# Patient Record
Sex: Male | Born: 1955 | Race: Black or African American | Hispanic: No | Marital: Single | Smoking: Current every day smoker
Health system: Southern US, Community
[De-identification: ages and names within clinical notes are randomized; demographics above are authoritative.]

## PROBLEM LIST (undated history)

## (undated) DIAGNOSIS — E119 Type 2 diabetes mellitus without complications: Secondary | ICD-10-CM

## (undated) DIAGNOSIS — I739 Peripheral vascular disease, unspecified: Secondary | ICD-10-CM

## (undated) DIAGNOSIS — I10 Essential (primary) hypertension: Secondary | ICD-10-CM

## (undated) HISTORY — DX: Peripheral vascular disease, unspecified: I73.9

---

## 2008-07-24 ENCOUNTER — Emergency Department: Payer: Self-pay | Admitting: Emergency Medicine

## 2014-08-20 HISTORY — PX: LEG SURGERY: SHX1003

## 2015-02-06 ENCOUNTER — Encounter (HOSPITAL_COMMUNITY): Payer: Self-pay | Admitting: Emergency Medicine

## 2015-02-06 ENCOUNTER — Emergency Department (HOSPITAL_COMMUNITY)
Admission: EM | Admit: 2015-02-06 | Discharge: 2015-02-06 | Disposition: A | Discharge status: Home / Self Care | Payer: Non-veteran care | Attending: Emergency Medicine | Admitting: Emergency Medicine

## 2015-02-06 DIAGNOSIS — I739 Peripheral vascular disease, unspecified: Secondary | ICD-10-CM | POA: Diagnosis not present

## 2015-02-06 DIAGNOSIS — Z720 Tobacco use: Secondary | ICD-10-CM | POA: Diagnosis not present

## 2015-02-06 DIAGNOSIS — Z4801 Encounter for change or removal of surgical wound dressing: Secondary | ICD-10-CM | POA: Diagnosis present

## 2015-02-06 DIAGNOSIS — S81801A Unspecified open wound, right lower leg, initial encounter: Secondary | ICD-10-CM

## 2015-02-06 NOTE — Discharge Instructions (Signed)
Peripheral Vascular Disease Peripheral vascular disease (PVD) is a disease of the blood vessels that are not part of your heart and brain. A simple term for PVD is poor circulation. In most cases, PVD narrows the blood vessels that carry blood from your heart to the rest of your body. This can result in a decreased supply of blood to your arms, legs, and internal organs, like your stomach or kidneys. However, it most often affects a person's lower legs and feet. There are two types of PVD.  Organic PVD. This is the more common type. It is caused by damage to the structure of blood vessels.  Functional PVD. This is caused by conditions that make blood vessels contract and tighten (spasm). Without treatment, PVD tends to get worse over time. PVD can also lead to acute ischemic limb. This is when an arm or limb suddenly has trouble getting enough blood. This is a medical emergency. CAUSES Each type of PVD has many different causes. The most common cause of PVD is buildup of a fatty material (plaque) inside of your arteries (atherosclerosis). Small amounts of plaque can break off from the walls of the blood vessels and become lodged in a smaller artery. This blocks blood flow and can cause acute ischemic limb. Other common causes of PVD include:  Blood clots that form inside of blood vessels.  Injuries to blood vessels.  Diseases that cause inflammation of blood vessels or cause blood vessel spasms.  Health behaviors and health history that increase your risk of developing PVD. RISK FACTORS  You may have a greater risk of PVD if you:  Have a family history of PVD.  Have certain medical conditions, including:  High cholesterol.  Diabetes.  High blood pressure (hypertension).  Coronary heart disease.  Past problems with blood clots.  Past injury, such as burns or a broken bone. These may have damaged blood vessels in your limbs.  Buerger disease. This is caused by inflamed blood  vessels in your hands and feet.  Some forms of arthritis.  Rare birth defects that affect the arteries in your legs.  Use tobacco.  Do not get enough exercise.  Are obese.  Are age 50 or older. SIGNS AND SYMPTOMS  PVD may cause many different symptoms. Your symptoms depend on what part of your body is not getting enough blood. Some common signs and symptoms include:  Cramps in your lower legs. This may be a symptom of poor leg circulation (claudication).  Pain and weakness in your legs while you are physically active that goes away when you rest (intermittent claudication).  Leg pain when at rest.  Leg numbness, tingling, or weakness.  Coldness in a leg or foot, especially when compared with the other leg.  Skin or hair changes. These can include:  Hair loss.  Shiny skin.  Pale or bluish skin.  Thick toenails.  Inability to get or maintain an erection (erectile dysfunction). People with PVD are more prone to developing ulcers and sores on their toes, feet, or legs. These may take longer than normal to heal. DIAGNOSIS Your health care provider may diagnose PVD from your signs and symptoms. The health care provider will also do a physical exam. You may have tests to find out what is causing your PVD and determine its severity. Tests may include:  Blood pressure recordings from your arms and legs and measurements of the strength of your pulses (pulse volume recordings).  Imaging studies using sound waves to take pictures of   the blood flow through your blood vessels (Doppler ultrasound).  Injecting a dye into your blood vessels before having imaging studies using:  X-rays (angiogram or arteriogram).  Computer-generated X-rays (CT angiogram).  A powerful electromagnetic field and a computer (magnetic resonance angiogram or MRA). TREATMENT Treatment for PVD depends on the cause of your condition and the severity of your symptoms. It also depends on your age. Underlying  causes need to be treated and controlled. These include long-lasting (chronic) conditions, such as diabetes, high cholesterol, and high blood pressure. You may need to first try making lifestyle changes and taking medicines. Surgery may be needed if these do not work. Lifestyle changes may include:  Quitting smoking.  Exercising regularly.  Following a low-fat, low-cholesterol diet. Medicines may include:  Blood thinners to prevent blood clots.  Medicines to improve blood flow.  Medicines to improve your blood cholesterol levels. Surgical procedures may include:  A procedure that uses an inflated balloon to open a blocked artery and improve blood flow (angioplasty).  A procedure to put in a tube (stent) to keep a blocked artery open (stent implant).  Surgery to reroute blood flow around a blocked artery (peripheral bypass surgery).  Surgery to remove dead tissue from an infected wound on the affected limb.  Amputation. This is surgical removal of the affected limb. This may be necessary in cases of acute ischemic limb that are not improved through medical or surgical treatments. HOME CARE INSTRUCTIONS  Take medicines only as directed by your health care provider.  Do not use any tobacco products, including cigarettes, chewing tobacco, or electronic cigarettes. If you need help quitting, ask your health care provider.  Lose weight if you are overweight, and maintain a healthy weight as directed by your health care provider.  Eat a diet that is low in fat and cholesterol. If you need help, ask your health care provider.  Exercise regularly. Ask your health care provider to suggest some good activities for you.  Use compression stockings or other mechanical devices as directed by your health care provider.  Take good care of your feet.  Wear comfortable shoes that fit well.  Check your feet often for any cuts or sores. SEEK MEDICAL CARE IF:  You have cramps in your legs  while walking.  You have leg pain when you are at rest.  You have coldness in a leg or foot.  Your skin changes.  You have erectile dysfunction.  You have cuts or sores on your feet that are not healing. SEEK IMMEDIATE MEDICAL CARE IF:  Your arm or leg turns cold and blue.  Your arms or legs become red, warm, swollen, painful, or numb.  You have chest pain or trouble breathing.  You suddenly have weakness in your face, arm, or leg.  You become very confused or lose the ability to speak.  You suddenly have a very bad headache or lose your vision.   This information is not intended to replace advice given to you by your health care provider. Make sure you discuss any questions you have with your health care provider.   Document Released: 05/20/2004 Document Revised: 05/03/2014 Document Reviewed: 09/20/2013 Elsevier Interactive Patient Education 2016 [employer]  

## 2015-02-06 NOTE — ED Notes (Signed)
Surgery April 26th 2016 at the [facility] in [place] right lower extremity to improve blood flow. Since then wound incision site right anterior lower leg. States unable to palpate pulses in right foot. States does have decreases feeling in right foot for 2 months and right foot turning darker in the past 2-3 weeks.  Here for second opinion.

## 2015-02-06 NOTE — ED Provider Notes (Signed)
CSN: [id number]     Arrival date & time 02/06/15  0935 History   First MD Initiated Contact with Patient 02/06/15 [phone]     Chief Complaint  Patient presents with  . Wound Check     (Consider location/radiation/quality/duration/timing/severity/associated sxs/prior Treatment) HPI Comments: 59 year old male here with right leg wound. He has a history of vascular disease and had a femoropopliteal bypass by the [place]. He still does not have good blood flow to the foot and wound healing on his right leg has been very poor. The wound is from his prior surgery. He does not have any fever, vomiting, cellulitis, systemic symptoms. He has not seen wound care for the wound. He is concerned because the [facility] wants [facility]. He wants a second opinion. When asked about claudication symptoms, he states he's able to walk long distances without pain and has no pain at rest.  Patient is a 59 y.o. male presenting with wound check. The history is provided by the patient.  Wound Check This is a new problem. The current episode started more than 1 week ago. The problem occurs constantly. The problem has been gradually worsening. Pertinent negatives include no chest pain and no abdominal pain. Nothing aggravates the symptoms. Nothing relieves the symptoms.    History reviewed. No pertinent past medical history. Past Surgical History  Procedure Laterality Date  . Leg surgery     No family history on file. Social History  Substance Use Topics  . Smoking status: Current Every Day Smoker  . Smokeless tobacco: None  . Alcohol Use: No    Review of Systems  Constitutional: Negative for fever and chills.  Respiratory: Negative for cough.   Cardiovascular: Negative for chest pain.  Gastrointestinal: Negative for vomiting and abdominal pain.  All other systems reviewed and are negative.     Allergies  Review of patient's allergies indicates no known allergies.  Home Medications   Prior to Admission  medications   Not on File   BP 121/76 mmHg  Pulse 105  Temp(Src) 98.6 F (37 C) (Oral)  Resp 18  Ht  (1.778 m)  Wt 172 lb (78.019 kg)  BMI 24.68 kg/m2  SpO2 99% Physical Exam  Constitutional: He is oriented to person, place, and time. He appears well-developed and well-nourished. No distress.  HENT:  Head: Normocephalic and atraumatic.  Mouth/Throat: No oropharyngeal exudate.  Eyes: EOM are normal. Pupils are equal, round, and reactive to light.  Neck: Normal range of motion. Neck supple.  Cardiovascular: Normal rate and regular rhythm.  Exam reveals no friction rub.   No murmur heard. Pulmonary/Chest: Effort normal and breath sounds normal. No respiratory distress. He has no wheezes. He has no rales.  Abdominal: Soft. He exhibits no distension. There is no tenderness. There is no rebound.  Musculoskeletal: Normal range of motion. He exhibits no edema.       Legs: No pulses palpable or dopplerable in the R foot.   Neurological: He is alert and oriented to person, place, and time.  Skin: No rash noted. He is not diaphoretic.  Nursing note and vitals reviewed.   ED Course  Procedures (including critical care time) Labs Review Labs Reviewed - No data to display  Imaging Review No results found. I have personally reviewed and evaluated these images and lab results as part of my medical decision-making.   EKG Interpretation None      MDM   Final diagnoses:  Peripheral arterial disease (HCC)  Leg  wound, right, initial encounter    [age] year old male here with peripheral arterial disease. He has chronic peripheral arterial disease and had a recent femoropopliteal bypass. He wants a second opinion because the [facility] wants [facility] to [facility]. Without symptoms such as claudication or rest pain, do not think he needs in the Edition here. He has no pulses in the right foot that I can find with manual inspection or with Doppler. I believe this is chronic. His wound appears to be  chronic but does not appear infected. I arrange vascular surgery follow-up for him next week with Dr. Imogene Burnhen. I also instructed him to follow-up with his PCP for optimization prior to his asked her surgery referral. I do not feel he needs acute intervention here he has good cap refill in his feet and his foot is not cold nor acutely painful. I do not think he has acute arterial ischemia, I believe this is more chronic. He is stable for discharge.    Elwin MochaBlair Anastaisa Wooding, MD 02/06/15 [phone]

## 2015-02-11 ENCOUNTER — Encounter: Payer: Self-pay | Admitting: Vascular Surgery

## 2015-02-14 ENCOUNTER — Ambulatory Visit (INDEPENDENT_AMBULATORY_CARE_PROVIDER_SITE_OTHER): Payer: Non-veteran care | Admitting: Vascular Surgery

## 2015-02-14 ENCOUNTER — Encounter: Payer: Self-pay | Admitting: Vascular Surgery

## 2015-02-14 VITALS — BP 106/65 | HR 106 | Temp 98.5°F | Resp 18 | Ht 70.5 in | Wt 167.0 lb

## 2015-02-14 DIAGNOSIS — I70732 Atherosclerosis of other type of bypass graft(s) of the right leg with ulceration of calf: Secondary | ICD-10-CM | POA: Diagnosis not present

## 2015-02-14 DIAGNOSIS — I739 Peripheral vascular disease, unspecified: Secondary | ICD-10-CM | POA: Insufficient documentation

## 2015-02-14 DIAGNOSIS — I70209 Unspecified atherosclerosis of native arteries of extremities, unspecified extremity: Secondary | ICD-10-CM | POA: Insufficient documentation

## 2015-02-14 DIAGNOSIS — L98499 Non-pressure chronic ulcer of skin of other sites with unspecified severity: Secondary | ICD-10-CM

## 2015-02-14 NOTE — Progress Notes (Signed)
Referred by:  [facility]  Reason for referral: second opinion R leg ischemia  History of Present Illness  Brad Frost is a 59 y.o. (04/20/1956) male s/p R fem[facility]pop [facility] [place] [facility] who presents with chief complaint: non-healing ulcer in R calf.  This patient is not clear the exact details of his recent bypass operation.  He notes when he returned to the [facility], he was told he needed a R BKA.  The patient has a ulcer in right ulcer that has not healed since his operation.   He denies any rest pain.  He does have some mild intermittent claudication still.  He denies any fever or chills.  The ulcer is somewhat improved from previous.    Past Medical History  Diagnosis Date  . Peripheral vascular disease [facility](HCC)     Past Surgical History  Procedure Laterality Date  . Leg surgery  August 20, 2014    Right Leg  BPG    Social History   Social History  . Marital Status: Single    Spouse Name: N/A  . Number of Children: N/A  . Years of Education: N/A   Occupational History  . Not on file.   Social History Main Topics  . Smoking status: Current Some Day Smoker    Types: Cigarettes  . Smokeless tobacco: Never Used  . Alcohol Use: No  . Drug Use: No  . Sexual Activity: Not on file   Other Topics Concern  . Not on file   Social History Narrative    Family History: the patient is unable to detail his parents' medical history.  Current Outpatient Prescriptions  Medication Sig Dispense Refill  . aspirin 81 MG tablet Take 81 mg by mouth daily.    Marland Kitchen. oxycodone (OXY-IR) 5 MG capsule Take 5 mg by mouth every 4 (four) hours as needed for pain.     No current facility-administered medications for this visit.    No Known Allergies   REVIEW OF SYSTEMS:  (Positives checked otherwise negative)  CARDIOVASCULAR:   [x]  coronary artery disease [ ]  chest pressure,  [x]  palpitations,  [ ]  shortness of breath when laying flat,  [ ]  shortness of breath with exertion,   [ ]  pain in  feet when walking,  [x]  pain in feet when laying flat, [x]  history of blood clot in veins (DVT),  [ ]  history of phlebitis,  [ ]  swelling in legs,  [ ]  varicose veins  PULMONARY:   [ ]  productive cough,  [ ]  asthma,  [ ]  wheezing  NEUROLOGIC:   [ ]  weakness in arms or legs,  [ ]  numbness in arms or legs,  [ ]  difficulty speaking or slurred speech,  [ ]  temporary loss of vision in one eye,  [ ]  dizziness  HEMATOLOGIC:   [ ]  bleeding problems,  [ ]  problems with blood clotting too easily  MUSCULOSKEL:   [ ]  joint pain, [ ]  joint swelling  GASTROINTEST:   [ ]  vomiting blood,  [ ]  blood in stool     GENITOURINARY:   [ ]  burning with urination,  [ ]  blood in urine  PSYCHIATRIC:   [ ]  history of major depression  INTEGUMENTARY:   [ ]  rashes,  [ ]  ulcers  CONSTITUTIONAL:   [ ]  fever,  [ ]  chills   For VQI Use Only  PRE-ADM LIVING: Home  AMB STATUS: Ambulatory  CAD Sx: None  PRIOR CHF: None  STRESS TEST: [  x] No,  Normal,  + ischemia,  + MI,  Both   Physical Examination  Filed Vitals:   02/14/15 1013  BP: 106/65  Pulse: 106  Temp: 98.5 F (36.9 C)  TempSrc: Oral  Resp: 18  Height: 5' 10.5" (1.791 m)  Weight: 167 lb (75.751 kg)  SpO2: 96%   Body mass index is 23.62 kg/(m^2).  General: A&O x 3, WD, thin  Head: [place]/AT  Ear/Nose/Throat: Hearing grossly intact, nares w/o erythema or drainage, oropharynx w/o Erythema/Exudate, Mallampati score: 3  Eyes: PERRLA, EOMI  Neck: Supple, no nuchal rigidity, no palpable LAD  Pulmonary: Sym exp, good air movt, CTAB, no rales, rhonchi, & wheezing  Cardiac: RRR, Nl S1, S2, no Murmurs, rubs or gallops  Vascular: Vessel Right Left  Radial Palpable Palpable  Brachial Palpable Palpable  Carotid Palpable, without bruit Palpable, without bruit  Aorta Not palpable N/A  Femoral Palpable Palpable  Popliteal Not palpable Not palpable  PT Not Palpable Not Palpable  DP NotPalpable Not Palpable    Gastrointestinal: soft, NTND, -G/R, - HSM, - masses, - CVAT B  Musculoskeletal: M/S 5/5 throughout , Extremities without ischemic changes , ischemic appearing ulcer in mid-calf distal to prior incision, vein harvest incision appears healed, R groin incision healed  Neurologic: CN 2-12 intact , Pain and light touch intact in extremities , Motor exam as listed above  Psychiatric: Judgment intact, Mood & affect appropriate for pt's clinical situation  Dermatologic: See M/S exam for extremity exam, no rashes otherwise noted  Lymph : No Cervical, Axillary, or Inguinal lymphadenopathy    Outside Studies/Documentation [facility] chart requested   Medical Decision Making  Brad Frost is a 59 y.o. male who presents with: RLE critical limb ischemia, s/p R fem-pop BPG   Unfortunately, patient came with no records with limited understanding of his disease and procedures completed for such.  My [facility] has request [facility].  I discussed with the patient the natural history of critical limb ischemia: 25% require amputation in one year, 50% are able to maintain their limbs in one year, and 25-30% die in one year due to comorbidities.  Given the limb threatening status of this patient, I recommend an aggressive work up including proceeding with an: Aortogram, Left leg runoff and possible intervention. I discussed with the patient the nature of angiographic procedures, especially the limited patencies of any endovascular intervention. The patient is aware of that the risks of an angiographic procedure include but are not limited to: bleeding, infection, access site complications, embolization, rupture of treated vessel, dissection, possible need for emergent surgical intervention, and possible need for surgical procedures to treat the patient's pathology. The patient is aware of the risks and agrees to proceed.  The procedure is scheduled for: 31 OCT 16.  I discussed  in depth with the patient the nature of atherosclerosis, and emphasized the importance of maximal medical management including strict control of blood pressure, blood glucose, and lipid levels, antiplatelet agents, obtaining regular exercise, and cessation of smoking.  The patient is aware that without maximal medical management the underlying atherosclerotic disease process will progress, limiting the benefit of any interventions. The patient is currently not on a statin:  Not medically indicated. The patient is currently on an anti-platelet: ASA.  Thank you for allowing [place] to participate in this patient's care.   Leonides Sake, MD [facility] Office: [phone] Pager: [phone]  02/14/2015, 4:34 PM

## 2015-02-17 ENCOUNTER — Other Ambulatory Visit: Payer: Self-pay

## 2015-02-17 NOTE — Addendum Note (Signed)
Addended by: Phillips OdorPULLINS, Viera Okonski S on: 02/17/2015 12:17 PM   Modules accepted: Medications

## 2015-02-21 ENCOUNTER — Encounter (HOSPITAL_BASED_OUTPATIENT_CLINIC_OR_DEPARTMENT_OTHER): Payer: Non-veteran care | Attending: Internal Medicine

## 2015-02-24 ENCOUNTER — Ambulatory Visit (HOSPITAL_COMMUNITY): Admission: RE | Admit: 2015-02-24 | Payer: Non-veteran care | Source: Ambulatory Visit | Admitting: Vascular Surgery

## 2015-02-24 ENCOUNTER — Encounter (HOSPITAL_COMMUNITY): Admission: RE | Payer: Self-pay | Source: Ambulatory Visit

## 2015-02-24 SURGERY — ABDOMINAL AORTOGRAM
Anesthesia: LOCAL

## 2015-05-16 ENCOUNTER — Telehealth: Payer: Self-pay

## 2015-05-16 NOTE — Telephone Encounter (Signed)
Attempted to call pt re: rescheduling Aortogram with right lower extremity runoff; possible intervention.  Left voice message to call the office re: rescheduling.  Attempted to contact pt's. Brother; left voice message to have the pt. Call our office re: rescheduling procedure.

## 2019-04-04 ENCOUNTER — Emergency Department: Payer: No Typology Code available for payment source

## 2019-04-04 ENCOUNTER — Inpatient Hospital Stay
Admission: EM | Admit: 2019-04-04 | Discharge: 2019-04-09 | DRG: 177 | Disposition: A | Discharge status: Skilled Nursing Facility | Payer: No Typology Code available for payment source | Source: Skilled Nursing Facility | Attending: Internal Medicine | Admitting: Internal Medicine

## 2019-04-04 ENCOUNTER — Encounter: Payer: Self-pay | Admitting: Emergency Medicine

## 2019-04-04 ENCOUNTER — Other Ambulatory Visit: Payer: Self-pay

## 2019-04-04 DIAGNOSIS — N179 Acute kidney failure, unspecified: Secondary | ICD-10-CM | POA: Diagnosis present

## 2019-04-04 DIAGNOSIS — E1140 Type 2 diabetes mellitus with diabetic neuropathy, unspecified: Secondary | ICD-10-CM | POA: Diagnosis present

## 2019-04-04 DIAGNOSIS — Z7984 Long term (current) use of oral hypoglycemic drugs: Secondary | ICD-10-CM | POA: Diagnosis not present

## 2019-04-04 DIAGNOSIS — R0902 Hypoxemia: Secondary | ICD-10-CM | POA: Diagnosis present

## 2019-04-04 DIAGNOSIS — Z86718 Personal history of other venous thrombosis and embolism: Secondary | ICD-10-CM

## 2019-04-04 DIAGNOSIS — Z7901 Long term (current) use of anticoagulants: Secondary | ICD-10-CM

## 2019-04-04 DIAGNOSIS — I739 Peripheral vascular disease, unspecified: Secondary | ICD-10-CM | POA: Diagnosis not present

## 2019-04-04 DIAGNOSIS — Z7982 Long term (current) use of aspirin: Secondary | ICD-10-CM | POA: Diagnosis not present

## 2019-04-04 DIAGNOSIS — J1289 Other viral pneumonia: Secondary | ICD-10-CM | POA: Diagnosis present

## 2019-04-04 DIAGNOSIS — I2699 Other pulmonary embolism without acute cor pulmonale: Secondary | ICD-10-CM | POA: Insufficient documentation

## 2019-04-04 DIAGNOSIS — I959 Hypotension, unspecified: Secondary | ICD-10-CM | POA: Diagnosis present

## 2019-04-04 DIAGNOSIS — I2782 Chronic pulmonary embolism: Secondary | ICD-10-CM | POA: Diagnosis present

## 2019-04-04 DIAGNOSIS — Z87891 Personal history of nicotine dependence: Secondary | ICD-10-CM

## 2019-04-04 DIAGNOSIS — U071 COVID-19: Principal | ICD-10-CM | POA: Diagnosis present

## 2019-04-04 DIAGNOSIS — Z8249 Family history of ischemic heart disease and other diseases of the circulatory system: Secondary | ICD-10-CM

## 2019-04-04 DIAGNOSIS — J850 Gangrene and necrosis of lung: Secondary | ICD-10-CM | POA: Diagnosis present

## 2019-04-04 DIAGNOSIS — Z66 Do not resuscitate: Secondary | ICD-10-CM | POA: Diagnosis present

## 2019-04-04 DIAGNOSIS — R918 Other nonspecific abnormal finding of lung field: Secondary | ICD-10-CM | POA: Insufficient documentation

## 2019-04-04 DIAGNOSIS — E1151 Type 2 diabetes mellitus with diabetic peripheral angiopathy without gangrene: Secondary | ICD-10-CM | POA: Diagnosis present

## 2019-04-04 DIAGNOSIS — Z7902 Long term (current) use of antithrombotics/antiplatelets: Secondary | ICD-10-CM | POA: Diagnosis not present

## 2019-04-04 DIAGNOSIS — J1282 Pneumonia due to coronavirus disease 2019: Secondary | ICD-10-CM | POA: Insufficient documentation

## 2019-04-04 DIAGNOSIS — F0390 Unspecified dementia without behavioral disturbance: Secondary | ICD-10-CM | POA: Diagnosis present

## 2019-04-04 HISTORY — DX: Type 2 diabetes mellitus without complications: E11.9

## 2019-04-04 LAB — LACTIC ACID, PLASMA: Lactic Acid, Venous: 1.7 mmol/L (ref 0.5–1.9)

## 2019-04-04 LAB — CBC WITH DIFFERENTIAL/PLATELET
Abs Immature Granulocytes: 0.02 10*3/uL (ref 0.00–0.07)
Basophils Absolute: 0 10*3/uL (ref 0.0–0.1)
Basophils Relative: 0 %
Eosinophils Absolute: 0 10*3/uL (ref 0.0–0.5)
Eosinophils Relative: 0 %
HCT: 43.3 % (ref 39.0–52.0)
Hemoglobin: 13.8 g/dL (ref 13.0–17.0)
Immature Granulocytes: 0 %
Lymphocytes Relative: 11 %
Lymphs Abs: 1 10*3/uL (ref 0.7–4.0)
MCH: 29.5 pg (ref 26.0–34.0)
MCHC: 31.9 g/dL (ref 30.0–36.0)
MCV: 92.5 fL (ref 80.0–100.0)
Monocytes Absolute: 0.8 10*3/uL (ref 0.1–1.0)
Monocytes Relative: 9 %
Neutro Abs: 6.8 10*3/uL (ref 1.7–7.7)
Neutrophils Relative %: 80 %
Platelets: 213 10*3/uL (ref 150–400)
RBC: 4.68 MIL/uL (ref 4.22–5.81)
RDW: 17 % — ABNORMAL HIGH (ref 11.5–15.5)
WBC: 8.6 10*3/uL (ref 4.0–10.5)
nRBC: 0 % (ref 0.0–0.2)

## 2019-04-04 LAB — C-REACTIVE PROTEIN: CRP: 3.6 mg/dL — ABNORMAL HIGH (ref <1.0)

## 2019-04-04 LAB — GLUCOSE, CAPILLARY: Glucose-Capillary: 81 mg/dL (ref 70–99)

## 2019-04-04 LAB — FERRITIN: Ferritin: 239 ng/mL (ref 24–336)

## 2019-04-04 LAB — HEMOGLOBIN A1C
Hgb A1c MFr Bld: 6.1 % — ABNORMAL HIGH (ref 4.8–5.6)
Mean Plasma Glucose: 128.37 mg/dL

## 2019-04-04 LAB — PROTIME-INR
INR: 1.3 — ABNORMAL HIGH (ref 0.8–1.2)
Prothrombin Time: 16 seconds — ABNORMAL HIGH (ref 11.4–15.2)

## 2019-04-04 LAB — COMPREHENSIVE METABOLIC PANEL
ALT: 31 U/L (ref 0–44)
AST: 38 U/L (ref 15–41)
Albumin: 2.6 g/dL — ABNORMAL LOW (ref 3.5–5.0)
Alkaline Phosphatase: 112 U/L (ref 38–126)
Anion gap: 14 (ref 5–15)
BUN: 51 mg/dL — ABNORMAL HIGH (ref 8–23)
CO2: 19 mmol/L — ABNORMAL LOW (ref 22–32)
Calcium: 8.3 mg/dL — ABNORMAL LOW (ref 8.9–10.3)
Chloride: 106 mmol/L (ref 98–111)
Creatinine, Ser: 1.82 mg/dL — ABNORMAL HIGH (ref 0.61–1.24)
GFR calc Af Amer: 44 mL/min — ABNORMAL LOW (ref >60)
GFR calc non Af Amer: 38 mL/min — ABNORMAL LOW (ref >60)
Glucose, Bld: 90 mg/dL (ref 70–99)
Potassium: 4.3 mmol/L (ref 3.5–5.1)
Sodium: 139 mmol/L (ref 135–145)
Total Bilirubin: 0.6 mg/dL (ref 0.3–1.2)
Total Protein: 7.2 g/dL (ref 6.5–8.1)

## 2019-04-04 LAB — HIV ANTIBODY (ROUTINE TESTING W REFLEX): HIV Screen 4th Generation wRfx: NONREACTIVE

## 2019-04-04 LAB — FIBRIN DERIVATIVES D-DIMER (ARMC ONLY): Fibrin derivatives D-dimer (ARMC): 2157.27 ng/mL (FEU) — ABNORMAL HIGH (ref 0.00–499.00)

## 2019-04-04 LAB — FIBRINOGEN: Fibrinogen: 533 mg/dL — ABNORMAL HIGH (ref 210–475)

## 2019-04-04 LAB — HEPARIN LEVEL (UNFRACTIONATED): Heparin Unfractionated: 1.66 IU/mL — ABNORMAL HIGH (ref 0.30–0.70)

## 2019-04-04 LAB — PROCALCITONIN: Procalcitonin: <0.1 ng/mL

## 2019-04-04 LAB — ABO/RH: ABO/RH(D): O POS

## 2019-04-04 LAB — APTT: aPTT: 32 seconds (ref 24–36)

## 2019-04-04 LAB — TROPONIN I (HIGH SENSITIVITY)
Troponin I (High Sensitivity): 11 ng/L (ref <18)
Troponin I (High Sensitivity): 15 ng/L (ref <18)

## 2019-04-04 MED ORDER — POLYETHYLENE GLYCOL 3350 17 G PO PACK
17.0000 g | PACK | Freq: Every day | ORAL | Status: DC
  Administered 2019-04-06 – 2019-04-09 (×3): 17 g via ORAL
  Filled 2019-04-04 (×3): qty 1

## 2019-04-04 MED ORDER — CILOSTAZOL 100 MG PO TABS
100.0000 mg | ORAL_TABLET | Freq: Two times a day (BID) | ORAL | Status: DC
  Administered 2019-04-05 – 2019-04-09 (×6): 100 mg via ORAL
  Filled 2019-04-04 (×12): qty 1

## 2019-04-04 MED ORDER — SENNOSIDES-DOCUSATE SODIUM 8.6-50 MG PO TABS
1.0000 | ORAL_TABLET | Freq: Two times a day (BID) | ORAL | Status: DC
  Administered 2019-04-04 – 2019-04-09 (×7): 1 via ORAL
  Filled 2019-04-04 (×10): qty 1

## 2019-04-04 MED ORDER — SODIUM CHLORIDE 0.9 % IV SOLN
100.0000 mg | Freq: Every day | INTRAVENOUS | Status: AC
  Administered 2019-04-05 – 2019-04-08 (×4): 100 mg via INTRAVENOUS
  Filled 2019-04-04 (×3): qty 100
  Filled 2019-04-04: qty 20

## 2019-04-04 MED ORDER — SODIUM CHLORIDE 0.9 % IV SOLN
INTRAVENOUS | Status: DC
  Administered 2019-04-04 – 2019-04-07 (×4): via INTRAVENOUS

## 2019-04-04 MED ORDER — VITAMIN D 25 MCG (1000 UNIT) PO TABS
1000.0000 [IU] | ORAL_TABLET | Freq: Every day | ORAL | Status: DC
  Administered 2019-04-04 – 2019-04-09 (×4): 1000 [IU] via ORAL
  Filled 2019-04-04 (×5): qty 1

## 2019-04-04 MED ORDER — IOHEXOL 300 MG/ML  SOLN
60.0000 mL | Freq: Once | INTRAMUSCULAR | Status: AC | PRN
  Administered 2019-04-04: 60 mL via INTRAVENOUS

## 2019-04-04 MED ORDER — ONDANSETRON HCL 4 MG/2ML IJ SOLN: 4.0000 mg | Freq: Four times a day (QID) | INTRAMUSCULAR | Status: DC | PRN

## 2019-04-04 MED ORDER — GABAPENTIN 300 MG PO CAPS
600.0000 mg | ORAL_CAPSULE | Freq: Two times a day (BID) | ORAL | Status: DC
  Administered 2019-04-04 – 2019-04-09 (×7): 600 mg via ORAL
  Filled 2019-04-04 (×7): qty 2
  Filled 2019-04-04: qty 6
  Filled 2019-04-04 (×2): qty 2

## 2019-04-04 MED ORDER — DEXAMETHASONE SODIUM PHOSPHATE 10 MG/ML IJ SOLN
10.0000 mg | Freq: Three times a day (TID) | INTRAMUSCULAR | Status: DC
  Administered 2019-04-04 – 2019-04-05 (×2): 10 mg via INTRAVENOUS
  Filled 2019-04-04 (×3): qty 1

## 2019-04-04 MED ORDER — FOLIC ACID 1 MG PO TABS
1.0000 mg | ORAL_TABLET | Freq: Every day | ORAL | Status: DC
  Administered 2019-04-04 – 2019-04-09 (×4): 1 mg via ORAL
  Filled 2019-04-04 (×6): qty 1

## 2019-04-04 MED ORDER — TAMSULOSIN HCL 0.4 MG PO CAPS
0.4000 mg | ORAL_CAPSULE | Freq: Every day | ORAL | Status: DC
  Administered 2019-04-04 – 2019-04-09 (×4): 0.4 mg via ORAL
  Filled 2019-04-04 (×5): qty 1

## 2019-04-04 MED ORDER — SODIUM CHLORIDE 0.9 % IV SOLN
2.0000 g | Freq: Two times a day (BID) | INTRAVENOUS | Status: DC
  Administered 2019-04-05: 2 g via INTRAVENOUS
  Filled 2019-04-04: qty 2

## 2019-04-04 MED ORDER — ZINC SULFATE 220 (50 ZN) MG PO CAPS
220.0000 mg | ORAL_CAPSULE | Freq: Two times a day (BID) | ORAL | Status: DC
  Administered 2019-04-05 – 2019-04-09 (×6): 220 mg via ORAL
  Filled 2019-04-04 (×11): qty 1

## 2019-04-04 MED ORDER — INSULIN ASPART 100 UNIT/ML ~~LOC~~ SOLN
0.0000 [IU] | Freq: Three times a day (TID) | SUBCUTANEOUS | Status: DC
  Administered 2019-04-07: 1 [IU] via SUBCUTANEOUS
  Filled 2019-04-04 (×3): qty 1

## 2019-04-04 MED ORDER — CEFEPIME HCL 1 G IJ SOLR
1.0000 g | Freq: Once | INTRAMUSCULAR | Status: AC
  Administered 2019-04-04: 1 g via INTRAVENOUS
  Filled 2019-04-04: qty 1

## 2019-04-04 MED ORDER — VITAMIN C 500 MG PO TABS
1000.0000 mg | ORAL_TABLET | Freq: Two times a day (BID) | ORAL | Status: DC
  Administered 2019-04-05 – 2019-04-09 (×6): 1000 mg via ORAL
  Filled 2019-04-04 (×9): qty 2

## 2019-04-04 MED ORDER — SODIUM CHLORIDE 0.9 % IV SOLN
200.0000 mg | Freq: Once | INTRAVENOUS | Status: AC
  Administered 2019-04-04: 200 mg via INTRAVENOUS
  Filled 2019-04-04: qty 200

## 2019-04-04 MED ORDER — AZITHROMYCIN 250 MG PO TABS
250.0000 mg | ORAL_TABLET | Freq: Every day | ORAL | Status: DC
  Administered 2019-04-07 – 2019-04-09 (×2): 250 mg via ORAL
  Filled 2019-04-04 (×5): qty 1

## 2019-04-04 MED ORDER — ACETAMINOPHEN 325 MG PO TABS: 650.0000 mg | ORAL_TABLET | Freq: Four times a day (QID) | ORAL | Status: DC | PRN

## 2019-04-04 MED ORDER — VITAMIN B-1 100 MG PO TABS
200.0000 mg | ORAL_TABLET | Freq: Every day | ORAL | Status: DC
  Administered 2019-04-07 – 2019-04-09 (×2): 200 mg via ORAL
  Filled 2019-04-04 (×5): qty 2

## 2019-04-04 MED ORDER — SODIUM CHLORIDE 0.9 % IV BOLUS
1000.0000 mL | Freq: Once | INTRAVENOUS | Status: AC
Start: 2019-04-04 — End: 2019-04-04
  Administered 2019-04-04: 1000 mL via INTRAVENOUS

## 2019-04-04 MED ORDER — OXYCODONE HCL 5 MG PO TABS: 5.0000 mg | ORAL_TABLET | ORAL | Status: DC | PRN

## 2019-04-04 MED ORDER — SODIUM CHLORIDE 0.9 % IV SOLN
500.0000 mg | Freq: Once | INTRAVENOUS | Status: AC
  Administered 2019-04-04: 500 mg via INTRAVENOUS
  Filled 2019-04-04: qty 500

## 2019-04-04 MED ORDER — HEPARIN BOLUS VIA INFUSION
4500.0000 [IU] | Freq: Once | INTRAVENOUS | Status: AC
  Administered 2019-04-04: 4500 [IU] via INTRAVENOUS
  Filled 2019-04-04: qty 4500

## 2019-04-04 MED ORDER — INSULIN ASPART 100 UNIT/ML ~~LOC~~ SOLN: 0.0000 [IU] | Freq: Every day | SUBCUTANEOUS | Status: DC

## 2019-04-04 MED ORDER — HEPARIN (PORCINE) 25000 UT/250ML-% IV SOLN
1200.0000 [IU]/h | INTRAVENOUS | Status: DC
  Administered 2019-04-04: 1250 [IU]/h via INTRAVENOUS
  Filled 2019-04-04 (×2): qty 250

## 2019-04-04 MED ORDER — ONDANSETRON HCL 4 MG PO TABS: 4.0000 mg | ORAL_TABLET | Freq: Four times a day (QID) | ORAL | Status: DC | PRN

## 2019-04-04 NOTE — Progress Notes (Signed)
Pharmacy Antibiotic Note  Brad Frost is a 63 y.o. male admitted on 04/04/2019 with pneumonia.  Pharmacy has been consulted for cefepime dosing. Patient is COVID positive and on remdesivir.  Plan: Cefepime 2 g IV q12h  Height: 5\' 10"  (177.8 cm) Weight: 175 lb (79.4 kg) IBW/kg (Calculated) : 73  Temp (24hrs), Avg:99 F (37.2 C), Min:99 F (37.2 C), Max:99 F (37.2 C)  Recent Labs  Lab 04/04/19 1404 04/04/19 1531  WBC  --  8.6  CREATININE 1.82*  --   LATICACIDVEN  --  1.7    Estimated Creatinine Clearance: 41.8 mL/min (A) (by C-G formula based on SCr of 1.82 mg/dL (H)).    No Known Allergies  Antimicrobials this admission: Cefepime 12/9 >> Azithromycin 12/9 >>  Dose adjustments this admission: NA  Microbiology results:   Thank you for allowing pharmacy to be a part of this patient's care.  Tawnya Crook, PharmD 04/04/2019 7:59 PM

## 2019-04-04 NOTE — H&P (Addendum)
[facility] [place] at [facility]   PATIENT NAME: Brad Frost    MR#:  [id number]  DATE OF BIRTH:  10/05/1955  DATE OF ADMISSION:  04/04/2019  PRIMARY CARE PHYSICIAN: [name], [place], MD   REQUESTING/REFERRING PHYSICIAN: Dr Dorothea GlassmanPaul MaLinda  CHIEF COMPLAINT:   Chief Complaint  Patient presents with  . Covid +  . Hypotension    HISTORY OF PRESENT ILLNESS:  Brad MeigsFrederick Mcginness  is a 63 y.o. male was over at [facility] getting rehab.  He could not tell me why he was there.  He is Covid positive as per [facility].  And hypotensive and they sent him over.  In the ER he had a CT scan of the chest that shows bilateral PE and a necrotic lung mass.  Patient does not complain of any shortness of breath or chest pain.  No coughing.  No coughing up blood.  No leg pain.  No fever chills or sweats. Hospitalist services contacted for further evaluation.  Spoke with brother on the phone and states that [facility] period of time and could not walk very well and they sent him over to rehab.  PAST MEDICAL HISTORY:   Past Medical History:  Diagnosis Date  . Diabetes mellitus without complication (HCC)   . Peripheral vascular disease (HCC)     PAST SURGICAL HISTORY:   Past Surgical History:  Procedure Laterality Date  . LEG SURGERY  August 20, 2014   Right Leg  BPG    SOCIAL HISTORY:   Social History   Tobacco Use  . Smoking status: Former Smoker    Types: Cigarettes  . Smokeless tobacco: Never Used  Substance Use Topics  . Alcohol use: No    FAMILY HISTORY:   Family History  Problem Relation Age of Onset  . Hypertension Mother     DRUG ALLERGIES:  No Known Allergies  REVIEW OF SYSTEMS:  CONSTITUTIONAL: No fever, fatigue or weakness.  EYES: No blurred or double vision.  EARS, NOSE, AND THROAT: No tinnitus or ear pain. No sore throat RESPIRATORY: No cough, shortness of breath, wheezing or hemoptysis.  CARDIOVASCULAR: No chest pain,  orthopnea, edema.  GASTROINTESTINAL: No nausea, vomiting, diarrhea or abdominal pain. No blood in bowel movements GENITOURINARY: No dysuria, hematuria.  ENDOCRINE: No polyuria, nocturia,  HEMATOLOGY: No anemia, easy bruising or bleeding SKIN: No rash or lesion. MUSCULOSKELETAL: No joint pain or arthritis.   NEUROLOGIC: No tingling, numbness, weakness.  PSYCHIATRY: No anxiety or depression.   MEDICATIONS AT HOME:   Prior to Admission medications   Medication Sig Start Date End Date Taking? Authorizing Provider  acetaminophen (TYLENOL) 500 MG tablet Take 500 mg by mouth every 6 (six) hours as needed.   Yes [provider]  atorvastatin (LIPITOR) 80 MG tablet Take 80 mg by mouth daily.   Yes [provider]  folic acid (FOLVITE) 1 MG tablet Take 1 mg by mouth daily.   Yes [provider]  gabapentin (NEURONTIN) 300 MG capsule Take 600 mg by mouth 2 (two) times daily.    Yes [provider]  metFORMIN (GLUCOPHAGE) 500 MG tablet Take 500 mg by mouth daily.   Yes [provider]  polyethylene glycol (MIRALAX / GLYCOLAX) 17 g packet Take 17 g by mouth 2 (two) times daily.   Yes [provider]  Rivaroxaban (XARELTO) 15 MG TABS tablet Take 15 mg by mouth 2 (two) times daily with a meal.   Yes  [provider]  senna-docusate (SENOKOT-S) 8.6-50 MG tablet Take 1 tablet by mouth 2 (two) times daily.   Yes [provider]  tamsulosin (FLOMAX) 0.4 MG CAPS capsule Take 0.4 mg by mouth daily.   Yes [provider]  thiamine (VITAMIN B-1) 100 MG tablet Take 200 mg by mouth daily.   Yes [provider]  zinc sulfate 220 (50 Zn) MG capsule Take 220 mg by mouth 2 (two) times daily.   Yes [provider]  aspirin 81 MG tablet Take 81 mg by mouth daily.    [provider]  cilostazol (PLETAL) 100 MG tablet Take 100 mg by mouth 2 (two) times daily.    [provider]  oxycodone (OXY-IR) 5 MG  capsule Take 5 mg by mouth every 4 (four) hours as needed for pain.    [provider]   Medication reconciliation still undergoing.  VITAL SIGNS:  Blood pressure 113/76, pulse (!) 110, temperature 99 F (37.2 C), temperature source Oral, resp. rate 18, height 5\' 10"  (1.778 m), weight 79.4 kg, SpO2 100 %.  PHYSICAL EXAMINATION:  GENERAL:  63 y.o.-year-old patient lying in the bed with no acute distress.  EYES: Pupils equal, round, reactive to light and accommodation. No scleral icterus. Extraocular muscles intact.  HEENT: Head atraumatic, normocephalic. Oropharynx and nasopharynx clear.  NECK:  Supple, no jugular venous distention. No thyroid enlargement, no tenderness.  LUNGS: Decreased breath sounds bilaterally, no wheezing, rales,rhonchi or crepitation. No use of accessory muscles of respiration.  CARDIOVASCULAR: S1, S2 normal. No murmurs, rubs, or gallops.  ABDOMEN: Soft, nontender, nondistended. Bowel sounds present. No organomegaly or mass.  EXTREMITIES: No pedal edema, cyanosis, or clubbing.  NEUROLOGIC: Cranial nerves II through XII are intact. Muscle strength 5/5 in all extremities. Sensation intact. Gait not checked.  PSYCHIATRIC: The patient is alert and answers yes or no questions appropriately but not the best historian.76  SKIN: Chronic lower extremity discoloration and scaling bilaterally.Marland Kitchen   LABORATORY PANEL:   CBC Recent Labs  Lab 04/04/19 1531  WBC 8.6  HGB 13.8  HCT 43.3  PLT 213   ------------------------------------------------------------------------------------------------------------------  Chemistries  Recent Labs  Lab 04/04/19 1404  NA 139  K 4.3  CL 106  CO2 19*  GLUCOSE 90  BUN 51*  CREATININE 1.82*  CALCIUM 8.3*  AST 38  ALT 31  ALKPHOS 112  BILITOT 0.6   ------------------------------------------------------------------------------------------------------------------    RADIOLOGY:  Ct Chest W Contrast  Result Date:  04/04/2019 CLINICAL DATA:  63 year old COVID-19 positive patient who had an abnormal chest x-ray earlier today, possibly indicating LEFT-sided pneumonia. EXAM: CT CHEST WITH CONTRAST TECHNIQUE: Multidetector CT imaging of the chest was performed during intravenous contrast administration. CONTRAST:  70mL OMNIPAQUE IOHEXOL 300 MG/ML IV. COMPARISON:  No prior CT. Chest x-ray earlier same day and previously. FINDINGS: Respiratory motion blurred many of the images. Cardiovascular: Filling defects within the distal main pulmonary arteries bilaterally extending into the branches of the RIGHT UPPER LOBE, RIGHT MIDDLE LOBE, RIGHT LOWER LOBE and LEFT UPPER LOBE. Since the examination was not performed with angiographic technique, opacification of the arteries is not optimal. There is no evidence of RIGHT heart strain. Normal heart size. Moderate LAD and RIGHT coronary atherosclerosis. No pericardial effusion. Mild atherosclerosis involving the aortic arch without evidence of aneurysm. Mediastinum/Nodes: No pathologically enlarged mediastinal, hilar or axillary lymph nodes. No mediastinal masses. Normal-appearing esophagus. 5 mm nodule involving the UPPER pole the RIGHT lobe of the thyroid gland; remainder of the thyroid  gland normal in appearance. Lungs/Pleura: Necrotic mass with irregular margins involving the anteroinferior LEFT UPPER LOBE, abutting the pleura, measuring approximately 3.2 x 2.6 x 2.9 cm (series 3/image 54 and series 5/image 51). No parenchymal nodules or masses elsewhere in either lung. Emphysematous changes diffusely throughout both lungs. Azygos fissure. Linear scar or atelectasis involving the LEFT LOWER LOBE and lingula. No pleural effusions. Confluent peripheral opacities deep in the POSTERIOR costophrenic sulcus of the LEFT LOWER LOBE. Central airways patent without significant bronchial wall thickening. Upper Abdomen: Phrygian cap involving the gallbladder which mimics a liver lesion. Visualized  upper abdomen unremarkable. Musculoskeletal: Mild degenerative changes involving the thoracic spine. No acute findings. IMPRESSION: 1. Bilateral pulmonary emboli involving the distal main pulmonary arteries bilaterally extending into the branches of the RIGHT UPPER LOBE, RIGHT MIDDLE LOBE, RIGHT LOWER LOBE and LEFT UPPER LOBE. 2. 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 3. Confluent airspace opacities deep in the POSTERIOR sulcus of the LEFT LOWER LOBE in the posterior costophrenic sulcus of the LEFT LOWER LOBE, atelectasis favored over pneumonia. 4. 5 mm nodule involving the upper pole the RIGHT lobe of the thyroid gland, statistically a benign adenoma. No followup recommended (ref: J Am Coll Radiol. 2015 Feb;12(2): 143-50). Aortic Atherosclerosis (ICD10-I70.0) and Emphysema (ICD10-J43.9). I telephoned these results at the time of interpretation on 04/04/2019 at 4:51 pm to provider Conni Slipper, MD of the emergency department, who verbally acknowledged these results. Electronically Signed   By: Evangeline Dakin M.D.   On: 04/04/2019 16:51   Dg Chest Portable 1 View  Result Date: 04/04/2019 CLINICAL DATA:  Hypotension, COVID-19 positive EXAM: PORTABLE CHEST 1 VIEW COMPARISON:  None. FINDINGS: The heart size and mediastinal contours are within normal limits. Focal airspace opacities within the mid to lower aspects of the left long. No pleural effusion or pneumothorax. The visualized skeletal structures are unremarkable. IMPRESSION: Left mid to lower lung zone airspace opacities suspicious for pneumonia. Electronically Signed   By: Davina Poke M.D.   On: 04/04/2019 12:48    EKG:   Sinus tachycardia 111 bpm nonspecific ST-T wave changes.  IMPRESSION AND PLAN:   1.  COVID-19 positive pneumonia.  Start Decadron, remdesivir, antibiotics.  Send off procalcitonin, D-dimer, ferritin, fibrinogen and CRP.   Empiric vitamin C, vitamin D and zinc. 2.  Bilateral pulmonary embolism.  Looks like he was on twice a day Xarelto as outpatient.  Will switch over to heparin drip at this point. 3.  Necrotic lung mass.  Suspicious for cancerous process.  Pulmonary consultation.  Likely will have to wait until Covid negative until procedure. 4.  Peripheral vascular disease on anticoagulation and Pletal.  Hold aspirin. 5.  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  We will put on sliding scale insulin.  Check a hemoglobin A1c. 6.  Acute kidney injury versus chronic kidney disease.  With contrast with CT scan need to watch creatinine closely.  Gentle IV fluid hydration. 7.  DO NOT RESUSCITATE present on admission.  Patient asked about his CODE STATUS and patient wishes to be a DO NOT RESUSCITATE at this time.  Case discussed with brother and he said for now can keep what the patient answered.    All the records are reviewed and case discussed with ED provider. Management plans discussed with the patient, family and they are in agreement.  CODE STATUS: DNR  TOTAL TIME TAKING CARE OF THIS PATIENT: 50 minutes.  Alford Highland M.D on 04/04/2019 at 5:55 PM  Between 7am to 6pm - Pager - [phone]  After 6pm call admission pager [phone]  [facility]  CC: Primary care physician; [name], [name], MD

## 2019-04-04 NOTE — ED Notes (Signed)
Pt linen and brief changed at this time. New gown placed on pt, Pt comfortable In bed with no needs at this time

## 2019-04-04 NOTE — ED Notes (Signed)
Lab called for a phlebotomy lab draw. Patient is a difficult stick.

## 2019-04-04 NOTE — Progress Notes (Signed)
ANTICOAGULATION CONSULT NOTE  Pharmacy Consult for heparin Indication: pulmonary embolus  No Known Allergies  Patient Measurements: Height: 5\' 10"  (177.8 cm) Weight: 175 lb (79.4 kg) IBW/kg (Calculated) : 73 Heparin Dosing Weight: 79 kg  Vital Signs: Temp: 99 F (37.2 C) (12/09 1224) Temp Source: Oral (12/09 1224) BP: 124/78 (12/09 1930) Pulse Rate: 120 (12/09 1930)  Labs: Recent Labs    04/04/19 1404 04/04/19 1425 04/04/19 1531 04/04/19 1740 04/04/19 1750  HGB  --   --  13.8  --   --   HCT  --   --  43.3  --   --   PLT  --   --  213  --   --   APTT  --   --   --  32  --   LABPROT  --   --   --  16.0*  --   INR  --   --   --  1.3*  --   HEPARINUNFRC  --   --   --   --  1.66*  CREATININE 1.82*  --   --   --   --   TROPONINIHS 11 15  --   --   --     Estimated Creatinine Clearance: 41.8 mL/min (A) (by C-G formula based on SCr of 1.82 mg/dL (H)).   Medical History: Past Medical History:  Diagnosis Date  . Diabetes mellitus without complication ([place])   . Peripheral vascular disease [facility])      Assessment: 63 year old male from [facility] tested COVID positive and hypotensive on arrival. CT chest with bilateral pulmonary emboli. Per MAR from [facility], patient is on Xarelto PTA. His dose is 15 mg BID with last dose 12/9 at 0900. Question if patient recently started treatment for PE or DVT as this represents initial treatment dosing of Xarelto. Patient to transition to heparin drip at this time.  Goal of Therapy:  Heparin level 0.3-0.7 units/ml aPTT 66-102 seconds Monitor platelets by anticoagulation protocol: Yes   Plan:  Heparin 4500 unit bolus followed by heparin drip at 1250 units/hr. Will follow APTT until correlation with HL. Will defer HL with am labs as it will likely remain elevated. APTT ordered for 12/10 at 0200. CBC daily while on heparin drip.  Tawnya Crook, PharmD 04/04/2019,8:02 PM

## 2019-04-04 NOTE — ED Triage Notes (Signed)
Pt to ED via EMS from [facility] with c/o testing covid +, and upon arrival, hypotensive. Pt denies any complaints or pain. bg en route 101, last night per nurse, pt had temp of 101 and c/o sore throat on Monday. Appears in no distress at this time.

## 2019-04-04 NOTE — ED Notes (Signed)
Pt is resting and is not expressing any needs at this time.  Pt was repositioned and a meal tray given.

## 2019-04-04 NOTE — ED Provider Notes (Signed)
[facility] Emergency Department Provider Note   ____________________________________________   First MD Initiated Contact with Patient 04/04/19 1214     (approximate)  I have reviewed the triage vital signs and the nursing notes.   HISTORY  Chief Complaint Covid + and Hypotension    HPI Brad Frost is a 63 y.o. male sent from [facility] because he tested Covid +45 minutes ago with him the doctor there thought he should go to [facility] to get better treatment.  Of course [facility] is only excepting patients who require 4 L of oxygen or more.  Patient does not have any oxygen requirements his O2 sats are 99 to 100% on room air.  Additionally he denies any complaints he had a sore throat yesterday but none today.  He is not having any shortness of breath no coughing although he has occasionally had a slight cough.  He is not coughing now.  He has no other aches or pains or chest tightness or anything else.  On checking his blood pressure was found to be low at 82 systolic.  This was not present during his ride here from EMS.  He does have a known history of peripheral vascular disease last blood pressure in the computers 120 and that was in 2016 when he also had peripheral vascular disease.  Again the EMS blood pressures were quite a bit higher.  When we checked his blood pressure with their blood pressure machine and it also comes out low now.         Past Medical History:  Diagnosis Date  . Diabetes mellitus without complication (HCC)   . Peripheral vascular disease [facility])     Patient Active Problem List   Diagnosis Date Noted  . 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 LEG SURGERY  August 20, 2014   Right Leg  BPG    Prior to Admission medications   Medication Sig Start Date End Date Taking? Authorizing Provider  aspirin 81 MG tablet Take 81 mg by mouth  daily.    [provider]  cilostazol (PLETAL) 100 MG tablet Take 100 mg by mouth 2 (two) times daily.    [provider]  gabapentin (NEURONTIN) 300 MG capsule Take 300 mg by mouth. TAKE 1 CAP BID X 7 DAYS, THEN TAKE 1 CAP TID X 7 DAYS, THEN TAKE 2 CAPS AT 8:00 AM, AND 2 CAPS AT BEDTIME X 14 DAYS.    [provider]  oxycodone (OXY-IR) 5 MG capsule Take 5 mg by mouth every 4 (four) hours as needed for pain.    [provider]    Allergies Patient has no known allergies.  No family history on file.  Social History Social History   Tobacco Use  . Smoking status: Current Some Day Smoker    Types: Cigarettes  . Smokeless tobacco: Never Used  Substance Use Topics  . Alcohol use: No  . Drug use: No    Review of Systems  Constitutional: No fever/chills Eyes: No visual changes. ENT: No sore throat. Cardiovascular: Denies chest pain. Respiratory: Denies shortness of breath. Gastrointestinal: No abdominal pain.  No nausea, no vomiting.  No diarrhea.  No constipation. Genitourinary: Negative for dysuria. Musculoskeletal: Negative for back pain. Skin: Negative for rash. Neurological: Negative for headaches, focal weakness   ____________________________________________   PHYSICAL EXAM:  VITAL SIGNS: ED Triage Vitals  Enc Vitals Group  BP 04/04/19 1224 (!) 83/56     Pulse --      Resp 04/04/19 1224 18     Temp 04/04/19 1216 99 F (37.2 C)     Temp Source 04/04/19 1216 Oral     SpO2 04/04/19 1224 99 %     Weight --      Height --      Head Circumference --      [facility] Flow --      Pain Score 04/04/19 1225 0     Pain Loc --      Pain Edu? --      Excl. in [place]? --    Constitutional: Alert and oriented. Well appearing and in no acute distress. Eyes: Conjunctivae are normal. PERRL. EOMI. Head: Atraumatic. Nose: No congestion/rhinnorhea. Mouth/Throat: Mucous membranes are moist.  Oropharynx non-erythematous. Neck: No stridor.   Cardiovascular: Normal rate, regular rhythm. Grossly normal heart sounds.  Good peripheral pulses. Respiratory: Normal respiratory effort.  No retractions. Lungs scattered crackles worse in the bases Gastrointestinal: Soft and nontender. No distention. No abdominal bruits. No CVA tenderness. Musculoskeletal: No lower extremity tenderness nor edema.  . Neurologic:  Normal speech and language. No gross focal neurologic deficits are appreciated.  Skin:  Skin is warm, dry and intact. No rash noted.   ____________________________________________   LABS (all labs ordered are listed, but only abnormal results are displayed)  Labs Reviewed  COMPREHENSIVE METABOLIC PANEL - Abnormal; Notable for the following components:      Result Value   CO2 19 (*)    BUN 51 (*)    Creatinine, Ser 1.82 (*)    Calcium 8.3 (*)    Albumin 2.6 (*)    GFR calc non Af Amer 38 (*)    GFR calc Af Amer 44 (*)    All other components within normal limits  CBC WITH DIFFERENTIAL/PLATELET - Abnormal; Notable for the following components:   RDW 17.0 (*)    All other components within normal limits  LACTIC ACID, PLASMA  CBC WITH DIFFERENTIAL/PLATELET  URINALYSIS, COMPLETE (UACMP) WITH MICROSCOPIC  PROCALCITONIN  TROPONIN I (HIGH SENSITIVITY)  TROPONIN I (HIGH SENSITIVITY)   ____________________________________________  EKG  EKG read and interpreted by me shows sinus tachycardia rate of 111 normal axis flipped T's in V3 may be due to lead placement otherwise no acute changes. ____________________________________________  RADIOLOGY  ED MD interpretation: Patchy infiltrate worse on the left this is my reading we will wait for the radiologist read     CT read by radiology have not been able to review the film yet she has bilateral pulmonary emboli and a necrotic lung mass. Official radiology report(s): Dg Chest Portable 1 View  Result Date: 04/04/2019 CLINICAL DATA:  Hypotension, COVID-19 positive EXAM:  PORTABLE CHEST 1 VIEW COMPARISON:  None. FINDINGS: The heart size and mediastinal contours are within normal limits. Focal airspace opacities within the mid to lower aspects of the left long. No pleural effusion or pneumothorax. The visualized skeletal structures are unremarkable. IMPRESSION: Left mid to lower lung zone airspace opacities suspicious for pneumonia. Electronically Signed   By: Davina Poke M.D.   On: 04/04/2019 12:48    ____________________________________________   PROCEDURES  Procedure(s) performed (including Critical Care): Critical care time 45 minutes.  This includes checking on the patient several times reviewing his studies and talking to the radiologist and the hospitalist  Procedures   ____________________________________________   [place] / ASSESSMENT AND PLAN / ED COURSE Brad Frost  was evaluated in Emergency Department on 04/04/2019 for the symptoms described in the history of present illness. He was evaluated in the context of the global COVID-19 pandemic, which necessitated consideration that the patient might be at risk for infection with the SARS-CoV-2 virus that causes COVID-19. Institutional protocols and algorithms that pertain to the evaluation of patients at risk for COVID-19 are in a state of rapid change based on information released by regulatory bodies including the CDC and federal and state organizations. These policies and algorithms were followed during the patient's care in the ED.  Radiology calls back with the CT report of necrotic lung mass and bilateral pulmonary emboli.  I will start him on heparin and contact the hospitalist.             ____________________________________________   FINAL CLINICAL IMPRESSION(S) / ED DIAGNOSES  Final diagnoses:  Lung mass  Bilateral pulmonary embolism (HCC)  Hypotension, unspecified hypotension type  Lab test positive for detection of COVID-19 virus     ED Discharge Orders     None       Note:  This document was prepared using Dragon voice recognition software and may include unintentional dictation errors.    Arnaldo NatalMalinda, Niel Peretti F, MD 04/04/19 [id number]

## 2019-04-04 NOTE — ED Notes (Signed)
IV team at bedside 

## 2019-04-04 NOTE — ED Notes (Signed)
Report given to Gracie RN.

## 2019-04-05 ENCOUNTER — Other Ambulatory Visit: Payer: Self-pay

## 2019-04-05 DIAGNOSIS — I959 Hypotension, unspecified: Secondary | ICD-10-CM | POA: Insufficient documentation

## 2019-04-05 LAB — CBC
HCT: 35.1 % — ABNORMAL LOW (ref 39.0–52.0)
Hemoglobin: 11.4 g/dL — ABNORMAL LOW (ref 13.0–17.0)
MCH: 29.6 pg (ref 26.0–34.0)
MCHC: 32.5 g/dL (ref 30.0–36.0)
MCV: 91.2 fL (ref 80.0–100.0)
Platelets: 227 10*3/uL (ref 150–400)
RBC: 3.85 MIL/uL — ABNORMAL LOW (ref 4.22–5.81)
RDW: 16.8 % — ABNORMAL HIGH (ref 11.5–15.5)
WBC: 8.4 10*3/uL (ref 4.0–10.5)
nRBC: 0 % (ref 0.0–0.2)

## 2019-04-05 LAB — GLUCOSE, CAPILLARY
Glucose-Capillary: 103 mg/dL — ABNORMAL HIGH (ref 70–99)
Glucose-Capillary: 125 mg/dL — ABNORMAL HIGH (ref 70–99)
Glucose-Capillary: 130 mg/dL — ABNORMAL HIGH (ref 70–99)
Glucose-Capillary: 93 mg/dL (ref 70–99)

## 2019-04-05 LAB — BASIC METABOLIC PANEL
Anion gap: 11 (ref 5–15)
BUN: 37 mg/dL — ABNORMAL HIGH (ref 8–23)
CO2: 20 mmol/L — ABNORMAL LOW (ref 22–32)
Calcium: 8.3 mg/dL — ABNORMAL LOW (ref 8.9–10.3)
Chloride: 109 mmol/L (ref 98–111)
Creatinine, Ser: 1.09 mg/dL (ref 0.61–1.24)
GFR calc Af Amer: >60 mL/min (ref >60)
GFR calc non Af Amer: >60 mL/min (ref >60)
Glucose, Bld: 129 mg/dL — ABNORMAL HIGH (ref 70–99)
Potassium: 4.3 mmol/L (ref 3.5–5.1)
Sodium: 140 mmol/L (ref 135–145)

## 2019-04-05 LAB — MRSA PCR SCREENING: MRSA by PCR: NEGATIVE

## 2019-04-05 LAB — APTT: aPTT: 104 seconds — ABNORMAL HIGH (ref 24–36)

## 2019-04-05 MED ORDER — DEXAMETHASONE SODIUM PHOSPHATE 10 MG/ML IJ SOLN
6.0000 mg | Freq: Two times a day (BID) | INTRAMUSCULAR | Status: DC
  Administered 2019-04-05 – 2019-04-08 (×7): 6 mg via INTRAVENOUS
  Filled 2019-04-05 (×8): qty 0.6

## 2019-04-05 MED ORDER — RIVAROXABAN 20 MG PO TABS: 20.0000 mg | ORAL_TABLET | Freq: Every day | ORAL | Status: DC

## 2019-04-05 MED ORDER — RIVAROXABAN 15 MG PO TABS
15.0000 mg | ORAL_TABLET | Freq: Two times a day (BID) | ORAL | Status: DC
  Administered 2019-04-05 – 2019-04-09 (×7): 15 mg via ORAL
  Filled 2019-04-05 (×10): qty 1

## 2019-04-05 NOTE — ED Notes (Signed)
Checked on patient, brief dry and patient comfortable. Patient trying to sleep. Checked patient's blood sugar and it is stable. Patient instructed to call if help is needed

## 2019-04-05 NOTE — ED Notes (Addendum)
Pt given meal tray and diet ginger ale- states he wants to rest and does not want the rest of his medications right now

## 2019-04-05 NOTE — ED Notes (Signed)
Pt had not eaten any of previous tray

## 2019-04-05 NOTE — Progress Notes (Signed)
[place] for Xarelto Indication: pulmonary embolus  No Known Allergies  Patient Measurements: Height: 5\' 10"  (177.8 cm) Weight: 175 lb (79.4 kg) IBW/kg (Calculated) : 73 Heparin Dosing Weight: 79 kg  Vital Signs: BP: 106/70 (12/10 1300) Pulse Rate: 109 (12/10 1300)  Labs: Recent Labs    04/04/19 1404 04/04/19 1425 04/04/19 1531 04/04/19 1740 04/04/19 1750 04/05/19 0421  HGB  --   --  13.8  --   --  11.4*  HCT  --   --  43.3  --   --  35.1*  PLT  --   --  213  --   --  227  APTT  --   --   --  32  --  104*  LABPROT  --   --   --  16.0*  --   --   INR  --   --   --  1.3*  --   --   HEPARINUNFRC  --   --   --   --  1.66*  --   CREATININE 1.82*  --   --   --   --  1.09  TROPONINIHS 11 15  --   --   --   --     Estimated Creatinine Clearance: 69.8 mL/min (by C-G formula based on SCr of 1.09 mg/dL).   Medical History: Past Medical History:  Diagnosis Date  . Diabetes mellitus without complication ([place])   . Peripheral vascular disease [facility])      Assessment: 63 year old male from [facility] tested COVID positive and hypotensive on arrival. CT chest with bilateral pulmonary emboli. Per [facility] from [facility], patient is on Xarelto PTA. His dose is 15 mg BID with last dose 12/9 at 0900. Question if patient recently started treatment for PE or DVT as this represents initial treatment dosing of Xarelto. Patient to transition to heparin drip at this time. Pharmacy consulted for Xarelto dosing.  Goal of Therapy:  Heparin level 0.3-0.7 units/ml aPTT 66-102 seconds Monitor platelets by anticoagulation protocol: Yes   Plan:  Xarelto 15 mg BID x 21 days followed by 20 mg daily. CBC q72h while inpatient.  Tawnya Crook, PharmD 04/05/2019,3:49 PM

## 2019-04-05 NOTE — Plan of Care (Signed)
  Problem: Education: Goal: Knowledge of General Education information will improve Description: Including pain rating scale, medication(s)/side effects and non-pharmacologic comfort measures Outcome: Not Progressing Note: Patient is confused. Patient curses at staff when unable to answer questions. Patient profile was not competed due to confusion and patient being uncooperative. Spoke with patient's sister Brad Frost. She was given an update about his current cognitive condition.

## 2019-04-05 NOTE — ED Notes (Signed)
Pt given ginger ale.

## 2019-04-05 NOTE — ED Notes (Signed)
Pt expressed no needs to this RN. Will continue to monitor.

## 2019-04-05 NOTE — Progress Notes (Signed)
CT CHEST REVIEWED  1.LUL lung mass-maybe amendable to Percutaneous biopsy  2.B/L PE  3.LLL opacity atelectasis  Patient with active COVID infection  Plan -Recommend Anticoagulation for PE -Recommend Outpatient follow up for LUL lung mass- can follow up with [place] as outpatient or [facility] for defin[facility]ve dx. -consider Repeat CT chest in 4-6 weeks to assess interval changes -Patient is NOT amendable to Bronchoscopy at this time due to active COVID 19 infection and very high risk for dissemination of the virus.

## 2019-04-05 NOTE — Progress Notes (Signed)
PROGRESS NOTE    Brad Frost  YDX:[id number] DOB: 12-27-1955 DOA: 04/04/2019 PCP: [name], [name], MD   Brief Narrative:  Brad Frost  is a 63 y.o. male was over at [facility] getting rehab.  He could not tell me why he was there.  He is Covid positive as per [facility].  And hypotensive and they sent him over.  In the ER he had a CT scan of the chest that shows bilateral PE and a necrotic lung mass.  Patient does not complain of any shortness of breath or chest pain.  No coughing. Patient remained stable on room air.  He was started on Heparin infusion.  Subjective: Patient was feeling better when seen this morning he was little agitated by all the labs and blood glucose monitoring.  He quit smoking 1-1/7-month ago.  He is not aware of any cancer.  Assessment & Plan:   Active Problems:   PVD (peripheral vascular disease) (HCC)   COVID-19 virus infection  Bilateral PE with necrotic lung mass.  Patient was recently admitted twice at [facility] hospital, no records available.  Per chart review on care everywhere there is a follow-up note during his admission to facility which pointed out history of PE and DVT for which he was on Xarelto 15 mg twice daily.  Cannot found any other documentation.  She also mentioned about lung malignancy and a possible outpatient work-up.  Not sure for how long patient was on Xarelto.  He was placed on heparin infusion in ED.  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 -Restart Xarelto. -We can discontinue heparin infusion. -He will need further work-up for a possible lung malignancy.   COVID-19 positive test (U07.1, COVID-19).  Patient is saturating well on room air.  Denies any shortness of breath. -Started on remdesivir and Decadron-we will complete 5-day course. -Continue vitamin C, zinc and vitamin D3 supplement.  Type 2 diabetes.  Well-controlled diabetes with A1c of 6.1. CBG within normal range. -Continue with SSI.  AKI/CKD.  Unknown baseline but creatinine  improved to 1.09 this morning after hydration. -Continue to monitor.  Objective: Vitals:   04/05/19 0700 04/05/19 0900 04/05/19 1200 04/05/19 1300  BP: 105/70 104/74 103/71 106/70  Pulse: (!) 102 (!) 103 (!) 110 (!) 109  Resp: 20 16 19 16   Temp:      TempSrc:      SpO2: 100% 100% 99% 100%  Weight:      Height:        Intake/Output Summary (Last 24 hours) at 04/05/2019 1507 Last data filed at 04/04/2019 2133 Gross per 24 hour  Intake 1500 ml  Output --  Net 1500 ml   Filed Weights   04/04/19 1225  Weight: 79.4 kg    Examination:  General exam: Appears calm and comfortable  Respiratory system: Clear to auscultation. Respiratory effort normal. Cardiovascular system: S1 & S2 heard, RRR. No JVD, murmurs, rubs, gallops or clicks. No pedal edema. Gastrointestinal system: Abdomen is nondistended, soft and nontender. No organomegaly or masses felt. Normal bowel sounds heard. Central nervous system: Alert and oriented. No focal neurological deficits. Extremities: Symmetric 5 x 5 power. Skin: Hyperpigmentation on both lower extremities. Psychiatry:  Mood & affect appropriate.    DVT prophylaxis: Xarelto Code Status: DNR Family Communication: No family at bedside Disposition Plan: Most likely back to facility.  Consultants:     Procedures:  Antimicrobials:   Data Reviewed: I have personally reviewed following labs and imaging studies  CBC: Recent Labs  Lab  04/04/19 1531 04/05/19 0421  WBC 8.6 8.4  NEUTROABS 6.8  --   HGB 13.8 11.4*  HCT 43.3 35.1*  MCV 92.5 91.2  PLT 213 227   Basic Metabolic Panel: Recent Labs  Lab 04/04/19 1404 04/05/19 0421  NA 139 140  K 4.3 4.3  CL 106 109  CO2 19* 20*  GLUCOSE 90 129*  BUN 51* 37*  CREATININE 1.82* 1.09  CALCIUM 8.3* 8.3*   GFR: Estimated Creatinine Clearance: 69.8 mL/min (by C-G formula based on SCr of 1.09 mg/dL). Liver Function Tests: Recent Labs  Lab 04/04/19 1404  AST 38  ALT 31  ALKPHOS 112   BILITOT 0.6  PROT 7.2  ALBUMIN 2.6*   No results for input(s): LIPASE, AMYLASE in the last 168 hours. No results for input(s): AMMONIA in the last 168 hours. Coagulation Profile: Recent Labs  Lab 04/04/19 1740  INR 1.3*   Cardiac Enzymes: No results for input(s): CKTOTAL, CKMB, CKMBINDEX, TROPONINI in the last 168 hours. BNP (last 3 results) No results for input(s): PROBNP in the last 8760 hours. HbA1C: Recent Labs    04/04/19 1531  HGBA1C 6.1*   CBG: Recent Labs  Lab 04/04/19 2200 04/05/19 0025 04/05/19 0942  GLUCAP 81 93 125*   Lipid Profile: No results for input(s): CHOL, HDL, LDLCALC, TRIG, CHOLHDL, LDLDIRECT in the last 72 hours. Thyroid Function Tests: No results for input(s): TSH, T4TOTAL, FREET4, T3FREE, THYROIDAB in the last 72 hours. Anemia Panel: Recent Labs    04/04/19 1740  FERRITIN 239   Sepsis Labs: Recent Labs  Lab 04/04/19 1531 04/04/19 1608  PROCALCITON  --  <0.10  LATICACIDVEN 1.7  --     No results found for this or any previous visit (from the past 240 hour(s)).   Radiology Studies: CT Chest W Contrast  Result Date: 04/04/2019 CLINICAL DATA:  63 year old COVID-19 positive patient who had an abnormal chest x-ray earlier today, possibly indicating LEFT-sided pneumonia. EXAM: CT CHEST WITH CONTRAST TECHNIQUE: Multidetector CT imaging of the chest was performed during intravenous contrast administration. CONTRAST:  60mL OMNIPAQUE IOHEXOL 300 MG/ML IV. COMPARISON:  No prior CT. Chest x-ray earlier same day and previously. FINDINGS: Respiratory motion blurred many of the images. Cardiovascular: Filling defects within the distal main pulmonary arteries bilaterally extending into the branches of the RIGHT UPPER LOBE, RIGHT MIDDLE LOBE, RIGHT LOWER LOBE and LEFT UPPER LOBE. Since the examination was not performed with angiographic technique, opacification of the arteries is not optimal. There is no evidence of RIGHT heart strain. Normal heart size.  Moderate LAD and RIGHT coronary atherosclerosis. No pericardial effusion. Mild atherosclerosis involving the aortic arch without evidence of aneurysm. Mediastinum/Nodes: No pathologically enlarged mediastinal, hilar or axillary lymph nodes. No mediastinal masses. Normal-appearing esophagus. 5 mm nodule involving the UPPER pole the RIGHT lobe of the thyroid gland; remainder of the thyroid gland normal in appearance. Lungs/Pleura: Necrotic mass with irregular margins involving the anteroinferior LEFT UPPER LOBE, abutting the pleura, measuring approximately 3.2 x 2.6 x 2.9 cm (series 3/image 54 and series 5/image 51). No parenchymal nodules or masses elsewhere in either lung. Emphysematous changes diffusely throughout both lungs. Azygos fissure. Linear scar or atelectasis involving the LEFT LOWER LOBE and lingula. No pleural effusions. Confluent peripheral opacities deep in the POSTERIOR costophrenic sulcus of the LEFT LOWER LOBE. Central airways patent without significant bronchial wall thickening. Upper Abdomen: Phrygian cap involving the gallbladder which mimics a liver lesion. Visualized upper abdomen unremarkable. Musculoskeletal: Mild degenerative changes involving the thoracic spine.  No acute findings. IMPRESSION: 1. Bilateral pulmonary emboli involving the distal main pulmonary arteries bilaterally extending into the branches of the RIGHT UPPER LOBE, RIGHT MIDDLE LOBE, RIGHT LOWER LOBE and LEFT UPPER LOBE. 2. 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 3. Confluent airspace opacities deep in the POSTERIOR sulcus of the LEFT LOWER LOBE in the posterior costophrenic sulcus of the LEFT LOWER LOBE, atelectasis favored over pneumonia. 4. 5 mm nodule involving the upper pole the RIGHT lobe of the thyroid gland, statistically a benign adenoma. No followup recommended (ref: J Am Coll Radiol. 2015 Feb;12(2): 143-50).  Aortic Atherosclerosis (ICD10-I70.0) and Emphysema (ICD10-J43.9). I telephoned these results at the time of interpretation on 04/04/2019 at 4:51 pm to provider Conni Slipper, MD of the emergency department, who verbally acknowledged these results. Electronically Signed   By: Evangeline Dakin M.D.   On: 04/04/2019 16:51   DG Chest Portable 1 View  Result Date: 04/04/2019 CLINICAL DATA:  Hypotension, COVID-19 positive EXAM: PORTABLE CHEST 1 VIEW COMPARISON:  None. FINDINGS: The heart size and mediastinal contours are within normal limits. Focal airspace opacities within the mid to lower aspects of the left long. No pleural effusion or pneumothorax. The visualized skeletal structures are unremarkable. IMPRESSION: Left mid to lower lung zone airspace opacities suspicious for pneumonia. Electronically Signed   By: Davina Poke M.D.   On: 04/04/2019 12:48    Scheduled Meds: . azithromycin  250 mg Oral Daily  . cholecalciferol  1,000 Units Oral Daily  . cilostazol  100 mg Oral BID  . dexamethasone (DECADRON) injection  6 mg Intravenous Q12H  . folic acid  1 mg Oral Daily  . gabapentin  600 mg Oral BID  . insulin aspart  0-5 Units Subcutaneous QHS  . insulin aspart  0-9 Units Subcutaneous TID WC  . polyethylene glycol  17 g Oral Daily  . senna-docusate  1 tablet Oral BID  . tamsulosin  0.4 mg Oral Daily  . thiamine  200 mg Oral Daily  . vitamin C  1,000 mg Oral BID  . zinc sulfate  220 mg Oral BID   Continuous Infusions: . sodium chloride 50 mL/hr at 04/04/19 1958  . heparin 1,200 Units/hr (04/05/19 0558)  . remdesivir 100 mg in NS 100 mL       LOS: 1 day   Time spent: 40 minutes.  I personally reviewed his chart.  Lorella Nimrod, MD [facility] Pager [phone]  If 7PM-7AM, please contact night-coverage [email or web address] Password [facility] 04/05/2019, 3:07 PM   This record has been created using Systems analyst. Errors have been sought and corrected,but may not  always be located. Such creation errors do not reflect on the standard of care.

## 2019-04-05 NOTE — ED Notes (Signed)
Report given to Erica, RN

## 2019-04-05 NOTE — Care Management (Signed)
CSW attempted to contact pt's brother, Kahiau Schewe, at [phone], but was unsuccessful.  Patient comes from [facility] and arrived to the hospital COVID +.   CSW will continue to work on disposition.     Ardelle Anton, MSW, [facility] ([place]) Phone: [phone] Fax: [phone]

## 2019-04-05 NOTE — Progress Notes (Signed)
ANTICOAGULATION CONSULT NOTE  Pharmacy Consult for heparin Indication: pulmonary embolus  No Known Allergies  Patient Measurements: Height: 5\' 10"  (177.8 cm) Weight: 175 lb (79.4 kg) IBW/kg (Calculated) : 73 Heparin Dosing Weight: 79 kg  Vital Signs: BP: 108/81 (12/10 0400) Pulse Rate: 102 (12/10 0400)  Labs: Recent Labs    04/04/19 1404 04/04/19 1425 04/04/19 1531 04/04/19 1740 04/04/19 1750 04/05/19 0421  HGB  --   --  13.8  --   --  11.4*  HCT  --   --  43.3  --   --  35.1*  PLT  --   --  213  --   --  227  APTT  --   --   --  32  --  104*  LABPROT  --   --   --  16.0*  --   --   INR  --   --   --  1.3*  --   --   HEPARINUNFRC  --   --   --   --  1.66*  --   CREATININE 1.82*  --   --   --   --   --   TROPONINIHS 11 15  --   --   --   --     Estimated Creatinine Clearance: 41.8 mL/min (A) (by C-G formula based on SCr of 1.82 mg/dL (H)).   Medical History: Past Medical History:  Diagnosis Date  . Diabetes mellitus without complication ([place])   . Peripheral vascular disease [facility])      Assessment: 63 year old male from [facility] tested COVID positive and hypotensive on arrival. CT chest with bilateral pulmonary emboli. [facility], patient is on Xarelto PTA. His dose is 15 mg BID with last dose 12/9 at 0900. Question if patient recently started treatment for PE or DVT as this represents initial treatment dosing of Xarelto. Patient to transition to heparin drip at this time.  Goal of Therapy:  Heparin level 0.3-0.7 units/ml aPTT 66-102 seconds Monitor platelets by anticoagulation protocol: Yes   Plan:  Heparin 4500 unit bolus followed by heparin drip at 1250 units/hr. Will follow APTT until correlation with HL. Will defer HL with am labs as it will likely remain elevated. APTT ordered for 12/10 at 0200. CBC daily while on heparin drip.  12/10 @ 0421 aPTT = 104, supratherapeutic, barely above goal.  Will reduce heparin infusion to 1200 units/hr and  recheck aPTT in 6 hrs  Ena Dawley, PharmD 04/05/2019,4:59 AM

## 2019-04-06 DIAGNOSIS — I959 Hypotension, unspecified: Secondary | ICD-10-CM

## 2019-04-06 DIAGNOSIS — R918 Other nonspecific abnormal finding of lung field: Secondary | ICD-10-CM

## 2019-04-06 DIAGNOSIS — I739 Peripheral vascular disease, unspecified: Secondary | ICD-10-CM

## 2019-04-06 DIAGNOSIS — U071 COVID-19: Principal | ICD-10-CM

## 2019-04-06 DIAGNOSIS — I2699 Other pulmonary embolism without acute cor pulmonale: Secondary | ICD-10-CM

## 2019-04-06 LAB — CBC
HCT: 33.6 % — ABNORMAL LOW (ref 39.0–52.0)
Hemoglobin: 11.1 g/dL — ABNORMAL LOW (ref 13.0–17.0)
MCH: 29.4 pg (ref 26.0–34.0)
MCHC: 33 g/dL (ref 30.0–36.0)
MCV: 89.1 fL (ref 80.0–100.0)
Platelets: 232 10*3/uL (ref 150–400)
RBC: 3.77 MIL/uL — ABNORMAL LOW (ref 4.22–5.81)
RDW: 16.3 % — ABNORMAL HIGH (ref 11.5–15.5)
WBC: 14 10*3/uL — ABNORMAL HIGH (ref 4.0–10.5)
nRBC: 0 % (ref 0.0–0.2)

## 2019-04-06 LAB — BASIC METABOLIC PANEL
Anion gap: 10 (ref 5–15)
BUN: 30 mg/dL — ABNORMAL HIGH (ref 8–23)
CO2: 18 mmol/L — ABNORMAL LOW (ref 22–32)
Calcium: 8.4 mg/dL — ABNORMAL LOW (ref 8.9–10.3)
Chloride: 113 mmol/L — ABNORMAL HIGH (ref 98–111)
Creatinine, Ser: 0.8 mg/dL (ref 0.61–1.24)
GFR calc Af Amer: >60 mL/min (ref >60)
GFR calc non Af Amer: >60 mL/min (ref >60)
Glucose, Bld: 143 mg/dL — ABNORMAL HIGH (ref 70–99)
Potassium: 3.7 mmol/L (ref 3.5–5.1)
Sodium: 141 mmol/L (ref 135–145)

## 2019-04-06 LAB — GLUCOSE, CAPILLARY
Glucose-Capillary: 117 mg/dL — ABNORMAL HIGH (ref 70–99)
Glucose-Capillary: 118 mg/dL — ABNORMAL HIGH (ref 70–99)
Glucose-Capillary: 121 mg/dL — ABNORMAL HIGH (ref 70–99)
Glucose-Capillary: 120 mg/dL — ABNORMAL HIGH (ref 70–99)

## 2019-04-06 NOTE — Progress Notes (Signed)
Patient refuses to take most PO medications from this AM, for unknown reasons. Also, patient was unaware he has COVID-19, and that he is in airborne / contact isolation for this. Additionally, he has already asked several times who I am and why I'm in his room. It appears the only orientation he has to where he is or what's going on is him verbalizing that it has something to do with "[facility]". Will continue to monitor neurological status for the remainder of the shift. [name] [facility]

## 2019-04-06 NOTE — NC FL2 (Signed)
[place] MEDICAID FL2 LEVEL OF CARE SCREENING TOOL     IDENTIFICATION  Patient Name: Brad Frost Birthdate: Jul 12, 1955 Sex: male Admission Date (Current Location): 04/04/2019  [place] and [place] Number:  Chiropodist and Address:  [facility], [address] [place], [place] [address]      Provider Number: [id number]  Attending Physician Name and Address:  Arnetha Courser, MD  Relative Name and Phone Number:  Benjermin, Korber [phone]    Current Level of Care: Hospital Recommended Level of Care: Skilled Nursing Facility Prior Approval Number:    Date Approved/Denied:   PASRR Number: Pending  Discharge Plan: SNF    Current Diagnoses: Patient Active Problem List   Diagnosis Date Noted  . Hypotension   . COVID-19 virus infection 04/04/2019  . Pneumonia due to COVID-19 virus   . Bilateral pulmonary embolism (HCC)   . Lung mass   . AKI (acute kidney injury) (HCC)   . Type 2 diabetes mellitus with diabetic neuropathy, without long-term current use of insulin (HCC)   . PVD (peripheral vascular disease) (HCC) 02/14/2015    Orientation RESPIRATION BLADDER Height & Weight     Self  Normal Incontinent Weight: 175 lb (79.4 kg) Height:  5\' 10"  (177.8 cm)  BEHAVIORAL SYMPTOMS/MOOD NEUROLOGICAL BOWEL NUTRITION STATUS      Incontinent Diet(Regular diet)  AMBULATORY STATUS COMMUNICATION OF NEEDS Skin   Limited Assist Verbally Normal                       Personal Care Assistance Level of Assistance  Bathing, Dressing, Feeding Bathing Assistance: Limited assistance Feeding assistance: Limited assistance Dressing Assistance: Limited assistance     Functional Limitations Info  Sight, Speech, Hearing Sight Info: Adequate Hearing Info: Adequate Speech Info: Adequate    SPECIAL CARE FACTORS FREQUENCY  PT (By licensed PT), OT (By licensed OT)     PT Frequency: Minimum 5x a week OT Frequency: Minimum 5x a week             Contractures Contractures Info: Not present    Additional Factors Info  Code Status, Allergies, Insulin Sliding Scale, Isolation Precautions Code Status Info: DNR Allergies Info: No Known Allergies   Insulin Sliding Scale Info: insulin aspart (novoLOG) injection 0-9 Units 3x a day with meals Isolation Precautions Info: Covid positive     Current Medications (04/06/2019):  This is the current hospital active medication list Current Facility-Administered Medications  Medication Dose Route Frequency Provider Last Rate Last Admin  . 0.9 %  sodium chloride infusion   Intravenous Continuous 14/02/2019, MD   Stopped at 04/06/19 1855  . acetaminophen (TYLENOL) tablet 650 mg  650 mg Oral Q6H PRN 14/11/20, MD      . azithromycin [facility]) tablet 250 mg  250 mg Oral Daily Wieting, Richard, MD      . cholecalciferol (VITAMIN D3) tablet 1,000 Units  1,000 Units Oral Daily [facility], MD   1,000 Units at 04/05/19 0957  . cilostazol (PLETAL) tablet 100 mg  100 mg Oral BID 14/10/20, MD   100 mg at 04/05/19 0958  . dexamethasone (DECADRON) injection 6 mg  6 mg Intravenous Q12H 14/10/20, MD   6 mg at 04/06/19 1017  . folic acid (FOLVITE) tablet 1 mg  1 mg Oral Daily 14/11/20, MD   1 mg at 04/05/19 0959  . gabapentin (NEURONTIN) capsule 600 mg  600 mg Oral BID 14/10/20, MD  600 mg at 04/06/19 1015  . insulin aspart (novoLOG) injection 0-5 Units  0-5 Units Subcutaneous QHS Wieting, Richard, MD      . insulin aspart (novoLOG) injection 0-9 Units  0-9 Units Subcutaneous TID WC Wieting, Richard, MD      . ondansetron [facility]) tablet 4 mg  4 mg Oral Q6H PRN Wieting, Richard, MD       Or  . ondansetron (ZOFRAN) injection 4 mg  4 mg Intravenous Q6H PRN Wieting, Richard, MD      . oxyCODONE (Oxy IR/ROXICODONE) immediate release tablet 5 mg  5 mg Oral Q4H PRN Wieting, Richard, MD      . polyethylene glycol (MIRALAX / GLYCOLAX) packet 17 g  17 g Oral Daily  Loletha Grayer, MD   17 g at 04/06/19 1016  . remdesivir 100 mg in sodium chloride 0.9 % 100 mL IVPB  100 mg Intravenous Daily Loletha Grayer, MD 200 mL/hr at 04/06/19 1021 100 mg at 04/06/19 1021  . Rivaroxaban (XARELTO) tablet 15 mg  15 mg Oral BID Lorella Nimrod, MD   15 mg at 04/05/19 1759   Followed by  . [START ON 04/26/2019] rivaroxaban (XARELTO) tablet 20 mg  20 mg Oral Q supper Lorella Nimrod, MD      . senna-docusate (Senokot-S) tablet 1 tablet  1 tablet Oral BID Loletha Grayer, MD   1 tablet at 04/05/19 2150  . tamsulosin (FLOMAX) capsule 0.4 mg  0.4 mg Oral Daily Loletha Grayer, MD   0.4 mg at 04/05/19 0955  . thiamine (VITAMIN B-1) tablet 200 mg  200 mg Oral Daily Wieting, Richard, MD      . vitamin C (ASCORBIC ACID) tablet 1,000 mg  1,000 mg Oral BID Loletha Grayer, MD   1,000 mg at 04/05/19 2153  . zinc sulfate capsule 220 mg  220 mg Oral BID Loletha Grayer, MD   220 mg at 04/05/19 2153     Discharge Medications: Please see discharge summary for a list of discharge medications.  Relevant Imaging Results:  Relevant Lab Results:   Additional Information SSN [id number]  Ross Ludwig, LCSW

## 2019-04-06 NOTE — TOC Progression Note (Signed)
Transition of Care [facility]) - Progression Note    Patient Details  Name: Brad Frost MRN: [id number] Date of Birth: 03-28-1956  Transition of Care [facility]) CM/SW Contact  Ross Ludwig, [place] Phone Number: 04/06/2019, 1:09 PM  Clinical Narrative:    Patient is short term rehab from [facility].  CSW spoke to SNF, they will call back about if and when he can return because patient is Covid+.  CSW to continue to follow patient's progress throughout discharge planning.      Expected Discharge Plan and Services    To return back to [facility].                                             Social Determinants of Health (SDOH) Interventions    Readmission Risk Interventions No flowsheet data found.

## 2019-04-06 NOTE — Progress Notes (Signed)
PT Cancellation Note  Patient Details Name: Brad Frost MRN: [id number] DOB: 07-06-1955   Cancelled Treatment:    Reason Eval/Treat Not Completed: Medical issues which prohibited therapy.  Per chart review pt with PE and started on IV heparin 04/04/19 at 19:55.  Per PT guidelines pt to be held from participation with PT services for 48 hours after initiation of anticoagulants.  Will attempt to see pt at a future date/time as medically appropriate.     Linus Salmons PT, DPT 04/06/19, 1:48 PM

## 2019-04-06 NOTE — Progress Notes (Signed)
Was informed by CCMD that patient was off of monitor. Upon entering room, patient's gown completely off, monitor on the floor and one PIV ripped out. Patient extremely confused, but doesn't appear impulsive. Refuses to be touched, which includes hooking fluids back up to patient and re-hooking heart monitor back up. Notified CCMD that patient is refusing. Will pause fluids on the MAR. Also cannot re-dress patient as he is refusing to be touched. Condom catheter also ripped completely off. Discarded catheter. Will inform incoming nurse and make documentation adjustments. [name] [facility]

## 2019-04-06 NOTE — Progress Notes (Signed)
Patient states, "take me [place] here man...back to [employer]. I gotta smoke a cigarette." Brad Frost [facility]

## 2019-04-06 NOTE — Progress Notes (Signed)
PROGRESS NOTE    Brad Frost  ZOX:[id number] DOB: 04/30/1955 DOA: 04/04/2019 PCP: [name], [place], MD   Brief Narrative:  Brad Frost  is a 63 y.o. male was over at [facility] getting rehab.  He could not tell me why he was there.  He is Covid positive as per [facility].  And hypotensive and they sent him over.  In the ER he had a CT scan of the chest that shows bilateral PE and a necrotic lung mass.  Patient does not complain of any shortness of breath or chest pain.  No coughing. Patient remained stable on room air.  He was started on Heparin infusion.  Subjective: Patient was feeling better when seen this morning.  Has no new complaints. He is just oriented to self does not know where is he.  Denies any shortness of breath.  Assessment & Plan:   Active Problems:   PVD (peripheral vascular disease) (HCC)   COVID-19 virus infection  Bilateral PE with necrotic lung mass.  [facility], no records available.  Per chart review on care everywhere there is a follow-up note during his admission to facility which pointed out history of PE and DVT for which he was on Xarelto 15 mg twice daily.  Cannot found any other documentation.  She also mentioned about lung malignancy and a possible outpatient work-up.  Not sure for how long patient was on Xarelto.  He was placed on heparin infusion in ED.  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 -Restarted Xarelto. -He will need further work-up for a possible lung malignancy. -Pulmonary saw him and they are recommending outpatient follow-up once recovered from Covid.   COVID-19 positive test (U07.1, COVID-19).  Patient is saturating well on room air.  Denies any shortness of breath. -Started on remdesivir and Decadron-we will complete 5-day course. -Continue vitamin C, zinc and vitamin D3 supplement.  Type 2 diabetes.  Well-controlled diabetes with A1c of 6.1. CBG within normal range. -Continue with  SSI.  AKI/CKD.  Unknown baseline but creatinine normalized now after hydration. -Continue to monitor.  Objective: Vitals:   04/06/19 0357 04/06/19 0545 04/06/19 0827 04/06/19 1632  BP: 114/80  116/76 114/85  Pulse: (!) 111 (!) 108 (!) 108 (!) 110  Resp:   19 20  Temp: 97.6 F (36.4 C)  (!) 97.5 F (36.4 C)   TempSrc: Oral  Oral   SpO2: 98%  98% 100%  Weight:      Height:        Intake/Output Summary (Last 24 hours) at 04/06/2019 1652 Last data filed at 04/06/2019 0700 Gross per 24 hour  Intake 1884.94 ml  Output 325 ml  Net 1559.94 ml   Filed Weights   04/04/19 1225  Weight: 79.4 kg    Examination:  General exam: Appears calm and comfortable  Respiratory system: Clear to auscultation. Respiratory effort normal. Cardiovascular system: S1 & S2 heard, RRR. No JVD, murmurs, rubs, gallops or clicks. No pedal edema. Gastrointestinal system: Abdomen is nondistended, soft and nontender. No organomegaly or masses felt. Normal bowel sounds heard. Central nervous system: Alert and oriented to self only. No focal neurological deficits. Extremities: Symmetric 5 x 5 power. Skin: Hyperpigmentation on both lower extremities. Psychiatry:  Mood & affect appropriate.   DVT prophylaxis: Xarelto Code Status: DNR Family Communication: No family at bedside Disposition Plan: Most likely back to facility.  Consultants:   Pulmonary  Procedures:  Antimicrobials:   Data Reviewed: I have personally  reviewed following labs and imaging studies  CBC: Recent Labs  Lab 04/04/19 1531 04/05/19 0421 04/06/19 0908  WBC 8.6 8.4 14.0*  NEUTROABS 6.8  --   --   HGB 13.8 11.4* 11.1*  HCT 43.3 35.1* 33.6*  MCV 92.5 91.2 89.1  PLT 213 227 232   Basic Metabolic Panel: Recent Labs  Lab 04/04/19 1404 04/05/19 0421 04/06/19 0908  NA 139 140 141  K 4.3 4.3 3.7  CL 106 109 113*  CO2 19* 20* 18*  GLUCOSE 90 129* 143*  BUN 51* 37* 30*  CREATININE 1.82* 1.09 0.80  CALCIUM 8.3* 8.3* 8.4*    GFR: Estimated Creatinine Clearance: 95.1 mL/min (by C-G formula based on SCr of 0.8 mg/dL). Liver Function Tests: Recent Labs  Lab 04/04/19 1404  AST 38  ALT 31  ALKPHOS 112  BILITOT 0.6  PROT 7.2  ALBUMIN 2.6*   No results for input(s): LIPASE, AMYLASE in the last 168 hours. No results for input(s): AMMONIA in the last 168 hours. Coagulation Profile: Recent Labs  Lab 04/04/19 1740  INR 1.3*   Cardiac Enzymes: No results for input(s): CKTOTAL, CKMB, CKMBINDEX, TROPONINI in the last 168 hours. BNP (last 3 results) No results for input(s): PROBNP in the last 8760 hours. HbA1C: Recent Labs    04/04/19 1531  HGBA1C 6.1*   CBG: Recent Labs  Lab 04/05/19 1602 04/05/19 2128 04/06/19 0829 04/06/19 1148 04/06/19 1630  GLUCAP 103* 130* 117* 118* 121*   Lipid Profile: No results for input(s): CHOL, HDL, LDLCALC, TRIG, CHOLHDL, LDLDIRECT in the last 72 hours. Thyroid Function Tests: No results for input(s): TSH, T4TOTAL, FREET4, T3FREE, THYROIDAB in the last 72 hours. Anemia Panel: Recent Labs    04/04/19 1740  FERRITIN 239   Sepsis Labs: Recent Labs  Lab 04/04/19 1531 04/04/19 1608  PROCALCITON  --  <0.10  LATICACIDVEN 1.7  --     Recent Results (from the past 240 hour(s))  MRSA PCR Screening     Status: None   Collection Time: 04/05/19  9:18 PM   Specimen: Nasal Mucosa; Nasopharyngeal  Result Value Ref Range Status   MRSA by PCR NEGATIVE NEGATIVE Final    Comment:        The GeneXpert MRSA Assay (FDA approved for NASAL specimens only), is one component of a comprehensive MRSA colonization surveillance program. It is not intended to diagnose MRSA infection nor to guide or monitor treatment for MRSA infections. Performed at [facility], [address] [place], [place] [address]      Radiology Studies: No results found.  Scheduled Meds: . azithromycin  250 mg Oral Daily  . cholecalciferol  1,000 Units Oral Daily  . cilostazol   100 mg Oral BID  . dexamethasone (DECADRON) injection  6 mg Intravenous Q12H  . folic acid  1 mg Oral Daily  . gabapentin  600 mg Oral BID  . insulin aspart  0-5 Units Subcutaneous QHS  . insulin aspart  0-9 Units Subcutaneous TID WC  . polyethylene glycol  17 g Oral Daily  . rivaroxaban  15 mg Oral BID   Followed by  . [START ON 04/26/2019] rivaroxaban  20 mg Oral Q supper  . senna-docusate  1 tablet Oral BID  . tamsulosin  0.4 mg Oral Daily  . thiamine  200 mg Oral Daily  . vitamin C  1,000 mg Oral BID  . zinc sulfate  220 mg Oral BID   Continuous Infusions: . sodium chloride 50 mL/hr at  04/05/19 1902  . remdesivir 100 mg in NS 100 mL 100 mg (04/06/19 1021)     LOS: 2 days   Time spent: 30 minutes.    Lorella Nimrod, MD [facility] Pager [phone]  If 7PM-7AM, please contact night-coverage [email or web address] Password [facility] 04/06/2019, 4:52 PM   This record has been created using Systems analyst. Errors have been sought and corrected,but may not always be located. Such creation errors do not reflect on the standard of care.

## 2019-04-06 NOTE — Progress Notes (Signed)
Patient is confused, and refusing care and medications. Patient can be verbally abusive at times but easily reoriented. Patient wants to go outside for a cigarette. Patient does not understand that he has covid and cannot leave his room. Patient is admitted for pulmonary emboli and refusing his xarelto. Notified Rufina Falco, NP. Patient did not eat his dinner and oral intake seems to be decreased.

## 2019-04-06 NOTE — Plan of Care (Signed)
  Problem: Education: Goal: Knowledge of General Education information will improve Description: Including pain rating scale, medication(s)/side effects and non-pharmacologic comfort measures Outcome: Not Progressing Note: Patient overtly confused today. Unsure of baseline. Appears from admission notes to be chronic. Will continue to monitor mentation for the remainder of the shift. [name] [facility]

## 2019-04-06 NOTE — Progress Notes (Signed)
Patient very confused, talking about things like how do we get here and going outside to smoke. Then patient appears upset and anxious r/t taking prescribed medications. Will continue to monitor. Was already confused per report from night shift RN. [name] [facility]

## 2019-04-06 NOTE — Progress Notes (Signed)
OT Cancellation Note  Patient Details Name: Brad Frost MRN: [id number] DOB: 01-08-1956   Cancelled Treatment:    Reason Eval/Treat Not Completed: Medical issues which prohibited therapy  OT order received and chart reviewed. Upon chart review and speaking with RN, pt with PE and started on IV heparin 04/04/19 at 19:55.  Per therapy guidelines, pt is to be held from participation with therapy services for 48 hours after initiation of anticoagulants.  Will f/u for OT evaluation on next scheduled date following 48 hours from heparin initiation. Thank you.   Gerrianne Scale, [place], OTR/L ascom [phone] 04/06/19, 2:34 PM

## 2019-04-07 LAB — GLUCOSE, CAPILLARY
Glucose-Capillary: 112 mg/dL — ABNORMAL HIGH (ref 70–99)
Glucose-Capillary: 127 mg/dL — ABNORMAL HIGH (ref 70–99)
Glucose-Capillary: 110 mg/dL — ABNORMAL HIGH (ref 70–99)
Glucose-Capillary: 119 mg/dL — ABNORMAL HIGH (ref 70–99)

## 2019-04-07 NOTE — Plan of Care (Signed)

## 2019-04-07 NOTE — Evaluation (Signed)
Occupational Therapy Evaluation Patient Details Name: Brad Frost MRN: [id number] DOB: 07/17/1955 Today's Date: 04/07/2019    History of Present Illness Per MD H&P: Pt is a 63 y.o. male was over at [facility] getting rehab.  He could not tell me why he was there.  He is Covid positive as per [facility].  And hypotensive and they sent him over.  In the ER he had a CT scan of the chest that shows bilateral PE and a necrotic lung mass.  Patient does not complain of any shortness of breath or chest pain.  No coughing.  No coughing up blood.  No leg pain.  No fever chills or sweats. Hospitalist services contacted for further evaluation.  MD assessment includes: Bilateral PE with necrotic lung mass, Covid-19 (+), DM II, AKI, and CKD.   Clinical Impression   Pt in bed busy with dinner - but willing to participate in eval - IV in R elbow and causing some pain - and bilateral stiffness in shoulders- WFL - decrease strength and activity tolerance. Pt  unable to provide a detailed or reliable history..  Pt followed commands inconsistently but with encouragement and frequent redirection was able to participate with functional activities.    Follow Up Recommendations     Equipment Recommendations    Pt show decrease  strength, transfers, functional mobility, balance, and activity tolerance.  Pt can benefit from OT services   Recommendations for Other Services       Precautions / Restrictions Precautions Precautions: Fall Restrictions Weight Bearing Restrictions: No      Mobility Bed Mobility Overal bed mobility: Needs Assistance Bed Mobility: Supine to Sit;Sit to Supine     Supine to sit: Min assist Sit to supine: Min assist   General bed mobility comments: Min A for BLE control during sup to/from sit  Transfers                      Balance Overall balance assessment: Needs assistance Sitting-balance support: Feet supported;Single extremity supported Sitting  balance-Leahy Scale: Fair                                     ADL either performed or assessed with clinical judgement   ADL                                         General ADL Comments: Max A for LB Dressing and bathing , mod A for UB bathing and dressing - grooming and eating I with setup     Vision Baseline Vision/History: Wears glasses Wears Glasses: Reading only       Perception     Praxis      Pertinent Vitals/Pain Pain Assessment: 0-10 Pain Score: 5  Pain Descriptors / Indicators: Aching;Tender Pain Intervention(s): Limited activity within patient's tolerance     Hand Dominance     Extremity/Trunk Assessment Upper Extremity Assessment Upper Extremity Assessment: Generalized weakness(AROM WFL - except stiff shoulder per pt and IV in R elbow - causing some pain)           Communication     Cognition Arousal/Alertness: Awake/alert Behavior During Therapy: WFL for tasks assessed/performed Overall Cognitive Status: No family/caregiver present to determine baseline cognitive functioning  General Comments: Pt confused with difficulty following commands at times during the session.  Pt also became agitated few time during session but can be  redirected.   General Comments       Exercises     Shoulder Instructions      Home Living Family/patient expects to be discharged to:: Private residence Living Arrangements: Alone Available Help at Discharge: Family;Available PRN/intermittently Type of Home: Apartment Home Access: Stairs to enter                         Additional Comments: Pt confused at times during history with reliablitiy of above information uncertain      Prior Functioning/Environment                   OT Problem List:        OT Treatment/Interventions:      OT Goals(Current goals can be found in the care plan section) Acute Rehab OT  Goals Patient Stated Goal: To go home OT Goal Formulation: With patient Time For Goal Achievement: 04/21/19 Potential to Achieve Goals: Fair  OT Frequency:     Barriers to D/C:            Co-evaluation              AM-PAC OT "6 Clicks" Daily Activity     Outcome Measure                 End of Session    Activity Tolerance: Patient tolerated treatment well Patient left: in bed;with bed alarm set;with call bell/phone within reach  OT Visit Diagnosis: Muscle weakness (generalized) (M62.81);Other abnormalities of gait and mobility (R26.89)                Time: 1701-1720 OT Time Calculation (min): 19 min Charges:  OT General Charges $OT Visit: 1 Visit OT Evaluation $OT Eval Low Complexity: 1 Low  GOAl 1: Pt to participate in 15 min of ADL's  And exercise on EOB with stable vitals 1 week  GOAL 2: pt to be mod A for bathroom transfers  2 weeks   Oletta Cohn OTR/L.CLT 04/07/2019, 5:39 PM

## 2019-04-07 NOTE — Evaluation (Signed)
Physical Therapy Evaluation Patient Details Name: Brad Frost MRN: [id number] DOB: 06-10-1955 Today's Date: 04/07/2019   History of Present Illness  Per MD H&P: Pt is a 63 y.o. male was over at [facility] getting rehab.  He could not tell me why he was there.  He is Covid positive as per [facility].  And hypotensive and they sent him over.  In the ER he had a CT scan of the chest that shows bilateral PE and a necrotic lung mass.  Patient does not complain of any shortness of breath or chest pain.  No coughing.  No coughing up blood.  No leg pain.  No fever chills or sweats. Hospitalist services contacted for further evaluation.  MD assessment includes: Bilateral PE with necrotic lung mass, Covid-19 (+), DM II, AKI, and CKD.    Clinical Impression  Pt presented with deficits in strength, transfers, mobility, gait, balance, and activity tolerance.  Pt with limited to no insight regarding his situation and was unable to provide a detailed or reliable history. Pt perseverated on leaving the hospital briefly just to have someone drive him by his apt so he could see his girlfriend.  Pt followed commands inconsistently but with encouragement and frequent redirection was able to participate with functional activities.  Pt ultimately was very limited functionally requiring assistance with bed mobility tasks and was unable to stand or laterally scoot at the EOB.  Pt will benefit from PT services in a SNF setting upon discharge to safely address above deficits for decreased caregiver assistance and eventual return to PLOF.      Follow Up Recommendations SNF    Equipment Recommendations  Rolling walker with 5" wheels    Recommendations for Other Services       Precautions / Restrictions Precautions Precautions: Fall Precaution Comments: covid (+) Restrictions Weight Bearing Restrictions: No      Mobility  Bed Mobility Overal bed mobility: Needs Assistance Bed Mobility: Supine to  Sit;Sit to Supine     Supine to sit: Min assist Sit to supine: Min assist   General bed mobility comments: Min A for BLE control during sup to/from sit  Transfers                 General transfer comment: Multiple attempts made to stand as well as to scoot laterally with pt unable to perform either  Ambulation/Gait             General Gait Details: Unable  Stairs            Wheelchair Mobility    Modified Rankin (Stroke Patients Only)       Balance Overall balance assessment: Needs assistance Sitting-balance support: Feet supported;Single extremity supported Sitting balance-Leahy Scale: Fair         Standing balance comment: Unable                             Pertinent Vitals/Pain Pain Assessment: No/denies pain    Home Living Family/patient expects to be discharged to:: Private residence Living Arrangements: Alone Available Help at Discharge: Family;Available PRN/intermittently(Brother and sister) Type of Home: Apartment Home Access: Stairs to enter Entrance Stairs-Rails: Right;Left;Can reach both Entrance Stairs-Number of Steps: Pt unsure how many steps Home Layout: One level Home Equipment: None Additional Comments: Pt confused at times during history with reliablitiy of above information uncertain    Prior Function Level of Independence: Independent  Comments: Ind amb without AD, no fall history, Ind with ADLs     Hand Dominance        Extremity/Trunk Assessment   Upper Extremity Assessment Upper Extremity Assessment: Generalized weakness    Lower Extremity Assessment Lower Extremity Assessment: Generalized weakness       Communication   Communication: No difficulties  Cognition Arousal/Alertness: Awake/alert Behavior During Therapy: WFL for tasks assessed/performed Overall Cognitive Status: No family/caregiver present to determine baseline cognitive functioning                                  General Comments: Pt confused with difficulty following commands at times during the session.  Pt also became agitated frequently during the session but was easily redirected.      General Comments      Exercises Total Joint Exercises Ankle Circles/Pumps: AROM;Both;5 reps Quad Sets: Strengthening;Both;5 reps Hip ABduction/ADduction: AROM;AAROM;Both;5 reps Straight Leg Raises: AROM;AAROM;Both;5 reps Long Arc Quad: AROM;Both;5 reps Knee Flexion: AROM;Both;5 reps Other Exercises Other Exercises: Static sitting at EOB with alternating UE support for core strengthening, sitting balance, and improved activity tolerance x 10 min   Assessment/Plan    PT Assessment Patient needs continued PT services  PT Problem List Decreased strength;Decreased activity tolerance;Decreased balance;Decreased mobility;Decreased knowledge of use of DME       PT Treatment Interventions DME instruction;Gait training;Stair training;Functional mobility training;Therapeutic activities;Therapeutic exercise;Balance training;Patient/family education    PT Goals (Current goals can be found in the Care Plan section)  Acute Rehab PT Goals Patient Stated Goal: To go home PT Goal Formulation: With patient Time For Goal Achievement: 04/20/19 Potential to Achieve Goals: Fair    Frequency Min 2X/week   Barriers to discharge Inaccessible home environment;Decreased caregiver support      Co-evaluation               AM-PAC PT "6 Clicks" Mobility  Outcome Measure Help needed turning from your back to your side while in a flat bed without using bedrails?: A Little Help needed moving from lying on your back to sitting on the side of a flat bed without using bedrails?: A Little Help needed moving to and from a bed to a chair (including a wheelchair)?: A Lot Help needed standing up from a chair using your arms (e.g., wheelchair or bedside chair)?: A Lot Help needed to walk in hospital room?: Total Help  needed climbing 3-5 steps with a railing? : Total 6 Click Score: 12    End of Session Equipment Utilized During Treatment: Gait belt Activity Tolerance: Patient tolerated treatment well Patient left: in bed;with call bell/phone within reach;with bed alarm set Nurse Communication: Mobility status PT Visit Diagnosis: Difficulty in walking, not elsewhere classified (R26.2);Muscle weakness (generalized) (M62.81)    Time: 1005-1047 PT Time Calculation (min) (ACUTE ONLY): 42 min   Charges:   PT Evaluation $PT Eval Moderate Complexity: 1 Mod PT Treatments $Therapeutic Exercise: 8-22 mins        D. Elly Modena PT, DPT 04/07/19, 12:33 PM

## 2019-04-07 NOTE — Discharge Summary (Signed)
Physician Discharge Summary  Brad Frost ZOX:[id number] DOB: 05/01/1955 DOA: 04/04/2019  PCP: [name], [place], MD  Admit date: 04/04/2019 Discharge date: 04/09/2019  Admitted From: [facility] Disposition:  SNF  Recommendations for Outpatient Follow-up:  1. Follow up with PCP in 1-2 weeks 2. Please obtain BMP/CBC in one week 3. Please follow-up with pulmonology as an outpatient for lung mass. 4. Please follow up on the following pending results: None  Home Health: No Equipment/Devices: None Discharge Condition: Stable CODE STATUS: Full Diet recommendation: Heart Healthy / Carb Modified   Brief/Interim Summary: [name] a63 y.o.malewas brought to ED from [facility] with hypotension and hypoxia.  He was found to have bilateral PE and a necrotic lung mass.  He was also positive for COVID-19 virus. [facility], [facility] available.  Per chart review on care everywhere there is a follow-up note during his admission to facility which pointed out history of PE and DVT for which he was on Xarelto 15 mg twice daily.  Cannot found any other documentation.  She also mentioned about lung malignancy and a possible outpatient work-up.  Not sure for how long patient was on Xarelto.  He was placed on heparin infusion in ED.  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 He was initially treated with heparin infusion and then switched to Xarelto. He needs to do twice daily dosing for 21 days and then daily. Pulmonary was consulted but he was not a good candidate for a bronchoscopy due to active COVID-19 infection and they recommend outpatient management for further evaluation of his lung mass.  He can either go to [place]ologist in [place] or back [facility] for [facility] and treatment.  He was treated with a 5-day course of remdesivir and Decadron for his COVID-19 infection.  His hypoxia improved quickly and he remained on room air for most of his  hospitalization.  Patient was also found to have some underlying dementia.  His diabetes was managed with SSI during hospitalization and he will resume his home meds on discharge.  Discharge Diagnoses:  Active Problems:   PVD (peripheral vascular disease) (HCC)   COVID-19 virus infection  Discharge Instructions  Discharge Instructions    Diet - low sodium heart healthy   Complete by: As directed    Discharge instructions   Complete by: As directed    It was pleasure taking care of you. Patient needs to follow-up with outpatient pulmonary either here or [facility].   Increase activity slowly   Complete by: As directed      Allergies as of 04/09/2019   No Known Allergies     Medication List    TAKE these medications   acetaminophen 500 MG tablet Commonly known as: TYLENOL Take 500 mg by mouth every 6 (six) hours as needed.   ascorbic acid 1000 MG tablet Commonly known as: VITAMIN C Take 1 tablet (1,000 mg total) by mouth 2 (two) times daily.   aspirin 81 MG tablet Take 81 mg by mouth daily.   atorvastatin 80 MG tablet Commonly known as: LIPITOR Take 80 mg by mouth daily.   cilostazol 100 MG tablet Commonly known as: PLETAL Take 100 mg by mouth 2 (two) times daily.   dexamethasone 4 MG tablet Commonly known as: DECADRON Take 1 tablet (4 mg total) by mouth daily for 5 days. Start taking on: April 10, 2019   folic acid 1 MG tablet Commonly known as: FOLVITE Take 1  mg by mouth daily.   gabapentin 300 MG capsule Commonly known as: NEURONTIN Take 600 mg by mouth 2 (two) times daily.   metFORMIN 500 MG tablet Commonly known as: GLUCOPHAGE Take 500 mg by mouth daily.   metoprolol succinate 25 MG 24 hr tablet Commonly known as: TOPROL-XL Take 1 tablet (25 mg total) by mouth daily.   oxycodone 5 MG capsule Commonly known as: OXY-IR Take 5 mg by mouth every 4 (four) hours as needed for pain.   polyethylene glycol 17 g packet Commonly  known as: MIRALAX / GLYCOLAX Take 17 g by mouth 2 (two) times daily.   Rivaroxaban 15 MG Tabs tablet Commonly known as: XARELTO Take 1 tablet (15 mg total) by mouth 2 (two) times daily for 18 days. What changed: when to take this   rivaroxaban 20 MG Tabs tablet Commonly known as: XARELTO Take 1 tablet (20 mg total) by mouth daily with supper. Starting from January 1 after finishing twice daily dose. Start taking on: April 26, 2019 What changed: You were already taking a medication with the same name, and this prescription was added. Make sure you understand how and when to take each.   senna-docusate 8.6-50 MG tablet Commonly known as: Senokot-S Take 1 tablet by mouth 2 (two) times daily.   tamsulosin 0.4 MG Caps capsule Commonly known as: FLOMAX Take 0.4 mg by mouth daily.   thiamine 100 MG tablet Commonly known as: VITAMIN B-1 Take 200 mg by mouth daily.   Vitamin D3 25 MCG tablet Commonly known as: Vitamin D Take 1 tablet (1,000 Units total) by mouth daily. Start taking on: April 10, 2019   zinc sulfate 220 (50 Zn) MG capsule Take 220 mg by mouth 2 (two) times daily.       No Known Allergies  Consultations:  Pulmonary  Procedures/Studies: CT Chest W Contrast  Result Date: 04/04/2019 CLINICAL DATA:  63 year old COVID-19 positive patient who had an abnormal chest x-ray earlier today, possibly indicating LEFT-sided pneumonia. EXAM: CT CHEST WITH CONTRAST TECHNIQUE: Multidetector CT imaging of the chest was performed during intravenous contrast administration. CONTRAST:  6mL OMNIPAQUE IOHEXOL 300 MG/ML IV. COMPARISON:  No prior CT. Chest x-ray earlier same day and previously. FINDINGS: Respiratory motion blurred many of the images. Cardiovascular: Filling defects within the distal main pulmonary arteries bilaterally extending into the branches of the RIGHT UPPER LOBE, RIGHT MIDDLE LOBE, RIGHT LOWER LOBE and LEFT UPPER LOBE. Since the examination was not performed  with angiographic technique, opacification of the arteries is not optimal. There is no evidence of RIGHT heart strain. Normal heart size. Moderate LAD and RIGHT coronary atherosclerosis. No pericardial effusion. Mild atherosclerosis involving the aortic arch without evidence of aneurysm. Mediastinum/Nodes: No pathologically enlarged mediastinal, hilar or axillary lymph nodes. No mediastinal masses. Normal-appearing esophagus. 5 mm nodule involving the UPPER pole the RIGHT lobe of the thyroid gland; remainder of the thyroid gland normal in appearance. Lungs/Pleura: Necrotic mass with irregular margins involving the anteroinferior LEFT UPPER LOBE, abutting the pleura, measuring approximately 3.2 x 2.6 x 2.9 cm (series 3/image 54 and series 5/image 51). No parenchymal nodules or masses elsewhere in either lung. Emphysematous changes diffusely throughout both lungs. Azygos fissure. Linear scar or atelectasis involving the LEFT LOWER LOBE and lingula. No pleural effusions. Confluent peripheral opacities deep in the POSTERIOR costophrenic sulcus of the LEFT LOWER LOBE. Central airways patent without significant bronchial wall thickening. Upper Abdomen: Phrygian cap involving the gallbladder which mimics a liver lesion. Visualized upper abdomen  unremarkable. Musculoskeletal: Mild degenerative changes involving the thoracic spine. No acute findings. IMPRESSION: 1. Bilateral pulmonary emboli involving the distal main pulmonary arteries bilaterally extending into the branches of the RIGHT UPPER LOBE, RIGHT MIDDLE LOBE, RIGHT LOWER LOBE and LEFT UPPER LOBE. 2. 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 3. Confluent airspace opacities deep in the POSTERIOR sulcus of the LEFT LOWER LOBE in the posterior costophrenic sulcus of the LEFT LOWER LOBE, atelectasis favored over pneumonia. 4. 5 mm nodule involving the upper pole the  RIGHT lobe of the thyroid gland, statistically a benign adenoma. No followup recommended (ref: J Am Coll Radiol. 2015 Feb;12(2): 143-50). Aortic Atherosclerosis (ICD10-I70.0) and Emphysema (ICD10-J43.9). I telephoned these results at the time of interpretation on 04/04/2019 at 4:51 pm to provider Dorothea Glassman, MD of the emergency department, who verbally acknowledged these results. Electronically Signed   By: Hulan Saas M.D.   On: 04/04/2019 16:51   DG Chest Portable 1 View  Result Date: 04/04/2019 CLINICAL DATA:  Hypotension, COVID-19 positive EXAM: PORTABLE CHEST 1 VIEW COMPARISON:  None. FINDINGS: The heart size and mediastinal contours are within normal limits. Focal airspace opacities within the mid to lower aspects of the left long. No pleural effusion or pneumothorax. The visualized skeletal structures are unremarkable. IMPRESSION: Left mid to lower lung zone airspace opacities suspicious for pneumonia. Electronically Signed   By: Duanne Guess M.D.   On: 04/04/2019 12:48   Subjective: Patient was feeling better when seen this morning has no new complaints.  Discharge Exam: Vitals:   04/09/19 0857 04/09/19 1048  BP: 114/80   Pulse: (!) 113 (!) 102  Resp: 19 15  Temp: 97.8 F (36.6 C)   SpO2: 99%    Vitals:   04/08/19 2058 04/09/19 0431 04/09/19 0857 04/09/19 1048  BP: 122/89 115/85 114/80   Pulse: 92 (!) 109 (!) 113 (!) 102  Resp: Temp: 98.7 F (37.1 C) 98.6 F (37 C) 97.8 F (36.6 C)   TempSrc: Oral Oral Oral   SpO2: 96% 98% 99%   Weight:  83.3 kg    Height:        General: Pt is alert, awake, not in acute distress Cardiovascular: RRR, S1/S2 +, no rubs, no gallops Respiratory: CTA bilaterally, no wheezing, no rhonchi Abdominal: Soft, NT, ND, bowel sounds + Extremities: no edema, no cyanosis   The results of significant diagnostics from this hospitalization (including imaging, microbiology, ancillary and laboratory) are listed below for reference.     Microbiology: Recent Results (from the past 240 hour(s))  MRSA PCR Screening     Status: None   Collection Time: 04/05/19  9:18 PM   Specimen: Nasal Mucosa; Nasopharyngeal  Result Value Ref Range Status   MRSA by PCR NEGATIVE NEGATIVE Final    Comment:        The GeneXpert MRSA Assay (FDA approved for NASAL specimens only), is one component of a comprehensive MRSA colonization surveillance program. It is not intended to diagnose MRSA infection nor to guide or monitor treatment for MRSA infections. Performed at [facility], [address] [place], [place] [address]      Labs: BNP (last 3 results) No results for input(s): BNP in the last 8760 hours. Basic Metabolic Panel: Recent Labs  Lab 04/04/19 1404 04/05/19 0421 04/06/19 0908 04/08/19 0526 04/09/19 0557  NA 139 140 141 141 140  K 4.3 4.3 3.7 4.0  3.5  CL 106 109 113* 114* 112*  CO2 19* 20* 18* 19* 20*  GLUCOSE 90 129* 143* 131* 101*  BUN 51* 37* 30* 18 16  CREATININE 1.82* 1.09 0.80 0.58* 0.51*  CALCIUM 8.3* 8.3* 8.4* 8.6* 8.3*   Liver Function Tests: Recent Labs  Lab 04/04/19 1404 04/09/19 0557  AST 38 60*  ALT 31 69*  ALKPHOS 112 109  BILITOT 0.6 0.7  PROT 7.2 6.0*  ALBUMIN 2.6* 2.1*   No results for input(s): LIPASE, AMYLASE in the last 168 hours. No results for input(s): AMMONIA in the last 168 hours. CBC: Recent Labs  Lab 04/04/19 1531 04/05/19 0421 04/06/19 0908 04/08/19 0526 04/09/19 0557  WBC 8.6 8.4 14.0* 16.6* 15.5*  NEUTROABS 6.8  --   --   --   --   HGB 13.8 11.4* 11.1* 11.4* 10.4*  HCT 43.3 35.1* 33.6* 34.2* 32.0*  MCV 92.5 91.2 89.1 88.6 91.7  PLT 213 227 232 218 206   Cardiac Enzymes: No results for input(s): CKTOTAL, CKMB, CKMBINDEX, TROPONINI in the last 168 hours. BNP: Invalid input(s): POCBNP CBG: Recent Labs  Lab 04/08/19 0849 04/08/19 1202 04/08/19 1641 04/08/19 2049 04/09/19 0859  GLUCAP 127* 107* 106* 106* 96   D-Dimer No results for input(s):  DDIMER in the last 72 hours. Hgb A1c No results for input(s): HGBA1C in the last 72 hours. Lipid Profile No results for input(s): CHOL, HDL, LDLCALC, TRIG, CHOLHDL, LDLDIRECT in the last 72 hours. Thyroid function studies Recent Labs    04/08/19 0526  TSH 0.411   Anemia work up No results for input(s): VITAMINB12, FOLATE, FERRITIN, TIBC, IRON, RETICCTPCT in the last 72 hours. Urinalysis No results found for: COLORURINE, APPEARANCEUR, LABSPEC, PHURINE, GLUCOSEU, HGBUR, BILIRUBINUR, KETONESUR, PROTEINUR, UROBILINOGEN, NITRITE, LEUKOCYTESUR Sepsis Labs Invalid input(s): PROCALCITONIN,  WBC,  LACTICIDVEN Microbiology Recent Results (from the past 240 hour(s))  MRSA PCR Screening     Status: None   Collection Time: 04/05/19  9:18 PM   Specimen: Nasal Mucosa; Nasopharyngeal  Result Value Ref Range Status   MRSA by PCR NEGATIVE NEGATIVE Final    Comment:        The GeneXpert MRSA Assay (FDA approved for NASAL specimens only), is one component of a comprehensive MRSA colonization surveillance program. It is not intended to diagnose MRSA infection nor to guide or monitor treatment for MRSA infections. Performed at [facility], [address], [place], [place] [address]     Time coordinating discharge: Over 30 minutes  SIGNED:  Arnetha Courser, MD  [facility] 04/09/2019, 11:44 AM Pager [phone]  If 7PM-7AM, please contact night-coverage [email or web address] Password [email or web address]  This record has been created using Conservation officer, historic buildings. Errors have been sought and corrected,but may not always be located. Such creation errors do not reflect on the standard of care.

## 2019-04-07 NOTE — Progress Notes (Signed)
PROGRESS NOTE    Brad Frost  NTI:[id number] DOB: May 03, 1955 DOA: 04/04/2019 PCP: [name], [name], MD   Brief Narrative:  Brad Frost  is a 63 y.o. male was over at [facility] getting rehab.  He could not tell me why he was there.  He is Covid positive as per [facility].  And hypotensive and they sent him over.  In the ER he had a CT scan of the chest that shows bilateral PE and a necrotic lung mass.  Patient does not complain of any shortness of breath or chest pain.  No coughing. Patient remained stable on room air.  He was started on Heparin infusion.  Subjective: Patient was feeling better when seen this morning.  Has no new complaints. We discussed the nursing concern yesterday that he refused his morning meds.  Per patient he was very sleepy yesterday and promised to take his meds regularly.  Assessment & Plan:   Active Problems:   PVD (peripheral vascular disease) (HCC)   COVID-19 virus infection  Bilateral PE with necrotic lung mass.  Patient was recently admitted twice at [facility], no records available.  Per chart review on care everywhere there is a follow-up note during his admission to facility which pointed out history of PE and DVT for which he was on Xarelto 15 mg twice daily.  Cannot found any other documentation.  She also mentioned about lung malignancy and a possible outpatient work-up.  Not sure for how long patient was on Xarelto.  He was placed on heparin infusion in ED.  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 -Restarted Xarelto. -He will need further work-up for a possible lung malignancy. -Pulmonary saw him and they are recommending outpatient follow-up once recovered from Covid.  Tachycardia.  Patient's heart rate remained mildly elevated in low 100s. He has bilateral PE.  COVID-19 infection. Unable to find TSH in his chart-so we will check TSH with morning labs. -Continue to monitor.  COVID-19 positive test (U07.1, COVID-19).  Patient is saturating well on  room air.  Denies any shortness of breath. -Started on remdesivir and Decadron-we will complete 5-day course-day 4 today. -Continue vitamin C, zinc and vitamin D3 supplement.  Type 2 diabetes.  Well-controlled diabetes with A1c of 6.1. CBG within normal range. -Continue with SSI.  AKI/CKD.  Unknown baseline but creatinine normalized now after hydration. -Continue to monitor.  Objective: Vitals:   04/06/19 1632 04/06/19 1947 04/07/19 0357 04/07/19 0817  BP: 114/85 127/85 (!) 119/92 (!) 126/97  Pulse: (!) 110 (!) 103 (!) 110 (!) 108  Resp: 20   18  Temp:  97.8 F (36.6 C) 98.1 F (36.7 C) 98.3 F (36.8 C)  TempSrc:  Oral Oral   SpO2: 100% 95% 99% 98%  Weight:   79.6 kg   Height:        Intake/Output Summary (Last 24 hours) at 04/07/2019 1405 Last data filed at 04/07/2019 1100 Gross per 24 hour  Intake 546 ml  Output 0 ml  Net 546 ml   Filed Weights   04/04/19 1225 04/07/19 0357  Weight: 79.4 kg 79.6 kg    Examination:  General exam: Appears calm and comfortable  Respiratory system: Clear to auscultation. Respiratory effort normal. Cardiovascular system: S1 & S2 heard, RRR. No JVD, murmurs, rubs, gallops or clicks. No pedal edema. Gastrointestinal system: Abdomen is nondistended, soft and nontender. No organomegaly or masses felt. Normal bowel sounds heard. Central nervous system: Alert and oriented to self only. No focal neurological deficits.  Extremities: Symmetric 5 x 5 power. Skin: Hyperpigmentation on both lower extremities. Psychiatry:  Mood & affect appropriate.   DVT prophylaxis: Xarelto Code Status: DNR Family Communication: No family at bedside Disposition Plan: He will go back to his facility, will be medically ready tomorrow after finishing remdesivir. Consultants:   Pulmonary  Procedures:  Antimicrobials:   Data Reviewed: I have personally reviewed following labs and imaging studies  CBC: Recent Labs  Lab 04/04/19 1531 04/05/19 0421  04/06/19 0908  WBC 8.6 8.4 14.0*  NEUTROABS 6.8  --   --   HGB 13.8 11.4* 11.1*  HCT 43.3 35.1* 33.6*  MCV 92.5 91.2 89.1  PLT 213 227 232   Basic Metabolic Panel: Recent Labs  Lab 04/04/19 1404 04/05/19 0421 04/06/19 0908  NA 139 140 141  K 4.3 4.3 3.7  CL 106 109 113*  CO2 19* 20* 18*  GLUCOSE 90 129* 143*  BUN 51* 37* 30*  CREATININE 1.82* 1.09 0.80  CALCIUM 8.3* 8.3* 8.4*   GFR: Estimated Creatinine Clearance: 95.1 mL/min (by C-G formula based on SCr of 0.8 mg/dL). Liver Function Tests: Recent Labs  Lab 04/04/19 1404  AST 38  ALT 31  ALKPHOS 112  BILITOT 0.6  PROT 7.2  ALBUMIN 2.6*   No results for input(s): LIPASE, AMYLASE in the last 168 hours. No results for input(s): AMMONIA in the last 168 hours. Coagulation Profile: Recent Labs  Lab 04/04/19 1740  INR 1.3*   Cardiac Enzymes: No results for input(s): CKTOTAL, CKMB, CKMBINDEX, TROPONINI in the last 168 hours. BNP (last 3 results) No results for input(s): PROBNP in the last 8760 hours. HbA1C: Recent Labs    04/04/19 1531  HGBA1C 6.1*   CBG: Recent Labs  Lab 04/06/19 1148 04/06/19 1630 04/06/19 2015 04/07/19 0819 04/07/19 1151  GLUCAP 118* 121* 120* 112* 127*   Lipid Profile: No results for input(s): CHOL, HDL, LDLCALC, TRIG, CHOLHDL, LDLDIRECT in the last 72 hours. Thyroid Function Tests: No results for input(s): TSH, T4TOTAL, FREET4, T3FREE, THYROIDAB in the last 72 hours. Anemia Panel: Recent Labs    04/04/19 1740  FERRITIN 239   Sepsis Labs: Recent Labs  Lab 04/04/19 1531 04/04/19 1608  PROCALCITON  --  <0.10  LATICACIDVEN 1.7  --     Recent Results (from the past 240 hour(s))  MRSA PCR Screening     Status: None   Collection Time: 04/05/19  9:18 PM   Specimen: Nasal Mucosa; Nasopharyngeal  Result Value Ref Range Status   MRSA by PCR NEGATIVE NEGATIVE Final    Comment:        The GeneXpert MRSA Assay (FDA approved for NASAL specimens only), is one component of  a comprehensive MRSA colonization surveillance program. It is not intended to diagnose MRSA infection nor to guide or monitor treatment for MRSA infections. Performed at [facility], [address] [place], [place] [address]      Radiology Studies: No results found.  Scheduled Meds: . azithromycin  250 mg Oral Daily  . cholecalciferol  1,000 Units Oral Daily  . cilostazol  100 mg Oral BID  . dexamethasone (DECADRON) injection  6 mg Intravenous Q12H  . folic acid  1 mg Oral Daily  . gabapentin  600 mg Oral BID  . insulin aspart  0-5 Units Subcutaneous QHS  . insulin aspart  0-9 Units Subcutaneous TID WC  . polyethylene glycol  17 g Oral Daily  . rivaroxaban  15 mg Oral BID   Followed by  . [  START ON 04/26/2019] rivaroxaban  20 mg Oral Q supper  . senna-docusate  1 tablet Oral BID  . tamsulosin  0.4 mg Oral Daily  . thiamine  200 mg Oral Daily  . vitamin C  1,000 mg Oral BID  . zinc sulfate  220 mg Oral BID   Continuous Infusions: . sodium chloride Stopped (04/06/19 1855)  . remdesivir 100 mg in NS 100 mL 100 mg (04/07/19 0923)     LOS: 3 days   Time spent: 30 minutes.    Brad Nimrod, MD [facility] Pager [phone]  If 7PM-7AM, please contact night-coverage [email or web address] Password [email or web address] 04/07/2019, 2:05 PM   This record has been created using Systems analyst. Errors have been sought and corrected,but may not always be located. Such creation errors do not reflect on the standard of care.

## 2019-04-08 LAB — BASIC METABOLIC PANEL
Anion gap: 8 (ref 5–15)
BUN: 18 mg/dL (ref 8–23)
CO2: 19 mmol/L — ABNORMAL LOW (ref 22–32)
Calcium: 8.6 mg/dL — ABNORMAL LOW (ref 8.9–10.3)
Chloride: 114 mmol/L — ABNORMAL HIGH (ref 98–111)
Creatinine, Ser: 0.58 mg/dL — ABNORMAL LOW (ref 0.61–1.24)
GFR calc Af Amer: >60 mL/min (ref >60)
GFR calc non Af Amer: >60 mL/min (ref >60)
Glucose, Bld: 131 mg/dL — ABNORMAL HIGH (ref 70–99)
Potassium: 4 mmol/L (ref 3.5–5.1)
Sodium: 141 mmol/L (ref 135–145)

## 2019-04-08 LAB — CBC
HCT: 34.2 % — ABNORMAL LOW (ref 39.0–52.0)
Hemoglobin: 11.4 g/dL — ABNORMAL LOW (ref 13.0–17.0)
MCH: 29.5 pg (ref 26.0–34.0)
MCHC: 33.3 g/dL (ref 30.0–36.0)
MCV: 88.6 fL (ref 80.0–100.0)
Platelets: 218 10*3/uL (ref 150–400)
RBC: 3.86 MIL/uL — ABNORMAL LOW (ref 4.22–5.81)
RDW: 16.3 % — ABNORMAL HIGH (ref 11.5–15.5)
WBC: 16.6 10*3/uL — ABNORMAL HIGH (ref 4.0–10.5)
nRBC: 0 % (ref 0.0–0.2)

## 2019-04-08 LAB — GLUCOSE, CAPILLARY
Glucose-Capillary: 127 mg/dL — ABNORMAL HIGH (ref 70–99)
Glucose-Capillary: 107 mg/dL — ABNORMAL HIGH (ref 70–99)
Glucose-Capillary: 106 mg/dL — ABNORMAL HIGH (ref 70–99)
Glucose-Capillary: 106 mg/dL — ABNORMAL HIGH (ref 70–99)

## 2019-04-08 LAB — TSH: TSH: 0.411 u[IU]/mL (ref 0.350–4.500)

## 2019-04-08 MED ORDER — METOPROLOL SUCCINATE ER 25 MG PO TB24
12.5000 mg | ORAL_TABLET | Freq: Every day | ORAL | Status: DC
  Administered 2019-04-08: 12.5 mg via ORAL
  Filled 2019-04-08: qty 1

## 2019-04-08 MED ORDER — LACTATED RINGERS IV SOLN
INTRAVENOUS | Status: AC
  Administered 2019-04-08: 15:00:00 via INTRAVENOUS

## 2019-04-08 NOTE — Progress Notes (Signed)
PROGRESS NOTE    Brad Frost  HQR:[id number] DOB: July 18, 1955 DOA: 04/04/2019 PCP: [name], [name], MD   Brief Narrative:  Brad Frost  is a 63 y.o. male was over at [facility] getting rehab.  He could not tell me why he was there.  He is Covid positive as per [facility].  And hypotensive and they sent him over.  In the ER he had a CT scan of the chest that shows bilateral PE and a necrotic lung mass.  Patient does not complain of any shortness of breath or chest pain.  No coughing. Patient remained stable on room air.  He was started on Heparin infusion.  Subjective: Patient was feeling better when seen this morning.  Has no new complaints. He appears lethargic, stating he was unable to sleep well last night and would like to get some rest.  Assessment & Plan:   Active Problems:   PVD (peripheral vascular disease) (HCC)   COVID-19 virus infection  Bilateral PE with necrotic lung mass.  Patient was recently admitted twice at [facility] hospital, no records available.  Per chart review on care everywhere there is a follow-up note during his admission to facility which pointed out history of PE and DVT for which he was on Xarelto 15 mg twice daily.  Cannot found any other documentation.  She also mentioned about lung malignancy and a possible outpatient work-up.  Not sure for how long patient was on Xarelto.  He was placed on heparin infusion in ED.  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 -Restarted Xarelto. -He will need further work-up for a possible lung malignancy. -Pulmonary saw him and they are recommending outpatient follow-up once recovered from Covid.  Tachycardia.  Patient's heart rate remained mildly elevated in low 100s. He has bilateral PE.  COVID-19 infection. TSH within lower normal limit. -Check free T4. -Start him on low-dose metoprolol. -Continue to monitor.  COVID-19 positive test (U07.1, COVID-19).  Patient is saturating well on room air.  Denies any shortness of  breath. -Started on remdesivir and Decadron- completed 5-day course today. -Continue vitamin C, zinc and vitamin D3 supplement.  Type 2 diabetes.  Well-controlled diabetes with A1c of 6.1. CBG within normal range. -Continue with SSI.  AKI/CKD.  Unknown baseline but creatinine normalized now after hydration. -Continue to monitor.  Objective: Vitals:   04/07/19 1708 04/07/19 2102 04/08/19 0604 04/08/19 0851  BP: (!) 134/101 129/89 (!) 122/91 (!) 113/92  Pulse: (!) 103 94 (!) 104 (!) 105  Resp: 18 18  19   Temp: 97.7 F (36.5 C) 97.7 F (36.5 C) (!) 97.5 F (36.4 C) 98.2 F (36.8 C)  TempSrc:  Oral Oral   SpO2: 98% 98% 100% 99%  Weight:   80.6 kg   Height:        Intake/Output Summary (Last 24 hours) at 04/08/2019 1417 Last data filed at 04/08/2019 0700 Gross per 24 hour  Intake 459.75 ml  Output 0 ml  Net 459.75 ml   Filed Weights   04/04/19 1225 04/07/19 0357 04/08/19 0604  Weight: 79.4 kg 79.6 kg 80.6 kg    Examination:  General exam: Appears calm and comfortable  Respiratory system: Clear to auscultation. Respiratory effort normal. Cardiovascular system: S1 & S2 heard, RRR. No JVD, murmurs, rubs, gallops or clicks. No pedal edema. Gastrointestinal system: Abdomen is nondistended, soft and nontender. No organomegaly or masses felt. Normal bowel sounds heard. Central nervous system: Alert and oriented to self only. No focal neurological deficits. Extremities: Symmetric 5  x 5 power. Skin: Hyperpigmentation on both lower extremities. Psychiatry:  Mood & affect appropriate.   DVT prophylaxis: Xarelto Code Status: DNR Family Communication: No family at bedside Disposition Plan: He will go back to his facility, social worker is working with his facility as they are reluctant due to his Covid positivity.  Consultants:   Pulmonary  Procedures:  Antimicrobials:   Data Reviewed: I have personally reviewed following labs and imaging studies  CBC: Recent Labs   Lab 04/04/19 1531 04/05/19 0421 04/06/19 0908 04/08/19 0526  WBC 8.6 8.4 14.0* 16.6*  NEUTROABS 6.8  --   --   --   HGB 13.8 11.4* 11.1* 11.4*  HCT 43.3 35.1* 33.6* 34.2*  MCV 92.5 91.2 89.1 88.6  PLT 213 227 232 295   Basic Metabolic Panel: Recent Labs  Lab 04/04/19 1404 04/05/19 0421 04/06/19 0908 04/08/19 0526  NA 139 140 141 141  K 4.3 4.3 3.7 4.0  CL 106 109 113* 114*  CO2 19* 20* 18* 19*  GLUCOSE 90 129* 143* 131*  BUN 51* 37* 30* 18  CREATININE 1.82* 1.09 0.80 0.58*  CALCIUM 8.3* 8.3* 8.4* 8.6*   GFR: Estimated Creatinine Clearance: 95.1 mL/min (A) (by C-G formula based on SCr of 0.58 mg/dL (L)). Liver Function Tests: Recent Labs  Lab 04/04/19 1404  AST 38  ALT 31  ALKPHOS 112  BILITOT 0.6  PROT 7.2  ALBUMIN 2.6*   No results for input(s): LIPASE, AMYLASE in the last 168 hours. No results for input(s): AMMONIA in the last 168 hours. Coagulation Profile: Recent Labs  Lab 04/04/19 1740  INR 1.3*   Cardiac Enzymes: No results for input(s): CKTOTAL, CKMB, CKMBINDEX, TROPONINI in the last 168 hours. BNP (last 3 results) No results for input(s): PROBNP in the last 8760 hours. HbA1C: No results for input(s): HGBA1C in the last 72 hours. CBG: Recent Labs  Lab 04/07/19 1151 04/07/19 1703 04/07/19 2108 04/08/19 0849 04/08/19 1202  GLUCAP 127* 110* 119* 127* 107*   Lipid Profile: No results for input(s): CHOL, HDL, LDLCALC, TRIG, CHOLHDL, LDLDIRECT in the last 72 hours. Thyroid Function Tests: Recent Labs    04/08/19 0526  TSH 0.411   Anemia Panel: No results for input(s): VITAMINB12, FOLATE, FERRITIN, TIBC, IRON, RETICCTPCT in the last 72 hours. Sepsis Labs: Recent Labs  Lab 04/04/19 1531 04/04/19 1608  PROCALCITON  --  <0.10  LATICACIDVEN 1.7  --     Recent Results (from the past 240 hour(s))  MRSA PCR Screening     Status: None   Collection Time: 04/05/19  9:18 PM   Specimen: Nasal Mucosa; Nasopharyngeal  Result Value Ref Range  Status   MRSA by PCR NEGATIVE NEGATIVE Final    Comment:        The GeneXpert MRSA Assay (FDA approved for NASAL specimens only), is one component of a comprehensive MRSA colonization surveillance program. It is not intended to diagnose MRSA infection nor to guide or monitor treatment for MRSA infections. Performed at [facility], [address] [place], [place] [address]      Radiology Studies: No results found.  Scheduled Meds: . azithromycin  250 mg Oral Daily  . cholecalciferol  1,000 Units Oral Daily  . cilostazol  100 mg Oral BID  . dexamethasone (DECADRON) injection  6 mg Intravenous Q12H  . folic acid  1 mg Oral Daily  . gabapentin  600 mg Oral BID  . insulin aspart  0-5 Units Subcutaneous QHS  . insulin aspart  0-9 Units Subcutaneous TID WC  . polyethylene glycol  17 g Oral Daily  . rivaroxaban  15 mg Oral BID   Followed by  . [START ON 04/26/2019] rivaroxaban  20 mg Oral Q supper  . senna-docusate  1 tablet Oral BID  . tamsulosin  0.4 mg Oral Daily  . thiamine  200 mg Oral Daily  . vitamin C  1,000 mg Oral BID  . zinc sulfate  220 mg Oral BID   Continuous Infusions: . lactated ringers       LOS: 4 days   Time spent: 30 minutes.    Arnetha CourserSumayya Gissel Keilman, MD [facility] Pager [phone]  If 7PM-7AM, please contact night-coverage [email or web address] Password [email or web address] 04/08/2019, 2:17 PM   This record has been created using Conservation officer, historic buildingsDragon voice recognition software. Errors have been sought and corrected,but may not always be located. Such creation errors do not reflect on the standard of care.

## 2019-04-08 NOTE — Plan of Care (Signed)

## 2019-04-08 NOTE — TOC Progression Note (Signed)
Transition of Care [facility]) - Progression Note    Patient Details  Name: Brad Frost MRN: [id number] Date of Birth: 1955-08-10  Transition of Care [facility]) CM/SW Contact  Din Bookwalter, M[place], Will Phone Number: 04/08/2019, 4:01 PM  Clinical Narrative:    Phone call from [place] at [facility] to discuss patient's return. Per Otila Kluver, she will have to get administrator approval before accepting patient back. She will call this CSW once decision is made.   [place], LCSW Clinical Social Work [phone]         Expected Discharge Plan and Services                                                 Social Determinants of Health (SDOH) Interventions    Readmission Risk Interventions No flowsheet data found.

## 2019-04-09 LAB — COMPREHENSIVE METABOLIC PANEL
ALT: 69 U/L — ABNORMAL HIGH (ref 0–44)
AST: 60 U/L — ABNORMAL HIGH (ref 15–41)
Albumin: 2.1 g/dL — ABNORMAL LOW (ref 3.5–5.0)
Alkaline Phosphatase: 109 U/L (ref 38–126)
Anion gap: 8 (ref 5–15)
BUN: 16 mg/dL (ref 8–23)
CO2: 20 mmol/L — ABNORMAL LOW (ref 22–32)
Calcium: 8.3 mg/dL — ABNORMAL LOW (ref 8.9–10.3)
Chloride: 112 mmol/L — ABNORMAL HIGH (ref 98–111)
Creatinine, Ser: 0.51 mg/dL — ABNORMAL LOW (ref 0.61–1.24)
GFR calc Af Amer: >60 mL/min (ref >60)
GFR calc non Af Amer: >60 mL/min (ref >60)
Glucose, Bld: 101 mg/dL — ABNORMAL HIGH (ref 70–99)
Potassium: 3.5 mmol/L (ref 3.5–5.1)
Sodium: 140 mmol/L (ref 135–145)
Total Bilirubin: 0.7 mg/dL (ref 0.3–1.2)
Total Protein: 6 g/dL — ABNORMAL LOW (ref 6.5–8.1)

## 2019-04-09 LAB — CBC
HCT: 32 % — ABNORMAL LOW (ref 39.0–52.0)
Hemoglobin: 10.4 g/dL — ABNORMAL LOW (ref 13.0–17.0)
MCH: 29.8 pg (ref 26.0–34.0)
MCHC: 32.5 g/dL (ref 30.0–36.0)
MCV: 91.7 fL (ref 80.0–100.0)
Platelets: 206 10*3/uL (ref 150–400)
RBC: 3.49 MIL/uL — ABNORMAL LOW (ref 4.22–5.81)
RDW: 16.3 % — ABNORMAL HIGH (ref 11.5–15.5)
WBC: 15.5 10*3/uL — ABNORMAL HIGH (ref 4.0–10.5)
nRBC: 0 % (ref 0.0–0.2)

## 2019-04-09 LAB — GLUCOSE, CAPILLARY
Glucose-Capillary: 96 mg/dL (ref 70–99)
Glucose-Capillary: 96 mg/dL (ref 70–99)

## 2019-04-09 LAB — T4, FREE: Free T4: 1.06 ng/dL (ref 0.61–1.12)

## 2019-04-09 MED ORDER — VITAMIN D3 25 MCG PO TABS: 1000.0000 [IU] | ORAL_TABLET | Freq: Every day | ORAL | Status: DC

## 2019-04-09 MED ORDER — ASCORBIC ACID 1000 MG PO TABS: 1000.0000 mg | ORAL_TABLET | Freq: Two times a day (BID) | ORAL | Status: DC

## 2019-04-09 MED ORDER — METOPROLOL SUCCINATE ER 25 MG PO TB24: 25.0000 mg | ORAL_TABLET | Freq: Every day | ORAL | Status: DC

## 2019-04-09 MED ORDER — DEXAMETHASONE 4 MG PO TABS: 4.0000 mg | ORAL_TABLET | Freq: Every day | ORAL | 0 refills | Status: DC

## 2019-04-09 MED ORDER — RIVAROXABAN 15 MG PO TABS: 15.0000 mg | ORAL_TABLET | Freq: Two times a day (BID) | ORAL | 0 refills | Status: DC

## 2019-04-09 MED ORDER — RIVAROXABAN 20 MG PO TABS: 20.0000 mg | ORAL_TABLET | Freq: Every day | ORAL | Status: DC

## 2019-04-09 MED ORDER — DEXAMETHASONE 4 MG PO TABS
4.0000 mg | ORAL_TABLET | Freq: Every day | ORAL | Status: DC
  Administered 2019-04-09: 4 mg via ORAL
  Filled 2019-04-09: qty 1

## 2019-04-09 NOTE — Progress Notes (Signed)
Patient is being discharge to [facility] for [facility], report called to receiving nurse , EMS was called waiting for transport / EMS .

## 2019-04-09 NOTE — TOC Transition Note (Signed)
Transition of Care [facility]) - CM/SW Discharge Note   Patient Details  Name: Brad Frost MRN: [id number] Date of Birth: Jul 14, 1955  Transition of Care [facility]) CM/SW Contact:  Ross Ludwig, LCSW Phone Number: 04/09/2019, 4:37 PM   Clinical Narrative:    [facility] to [facility] with his [facility]. CSW spoke to patient's niece and updated her on status.  Patient will be discharging today to [facility], room 810.   Final next level of care: Skilled Nursing Facility Barriers to Discharge: Barriers Resolved   Patient Goals and CMS Choice Patient states their goals for this hospitalization and ongoing recovery are:: To return to [facility] and continue with his therapy. CMS Medicare.gov Compare Post Acute Care list provided to:: Patient Choice offered to / list presented to : Patient, [facility] POA / Guardian  Discharge Placement PASRR number recieved: 04/09/19            Patient chooses bed at: [facility] Patient to be transferred to facility by: [facility] Name of family member notified: Niece [name] Patient and family notified of of transfer: 04/09/19  Discharge Plan and Services                DME Arranged: N/A         HH Arranged: NA          Social Determinants of Health (SDOH) Interventions     Readmission Risk Interventions No flowsheet data found.

## 2019-04-12 ENCOUNTER — Other Ambulatory Visit: Payer: Self-pay

## 2019-04-12 ENCOUNTER — Emergency Department: Payer: No Typology Code available for payment source

## 2019-04-12 ENCOUNTER — Inpatient Hospital Stay
Admission: EM | Admit: 2019-04-12 | Discharge: 2019-04-14 | DRG: 177 | Disposition: A | Discharge status: Skilled Nursing Facility | Payer: No Typology Code available for payment source | Source: Skilled Nursing Facility | Attending: Family Medicine | Admitting: Family Medicine

## 2019-04-12 DIAGNOSIS — E876 Hypokalemia: Secondary | ICD-10-CM | POA: Diagnosis present

## 2019-04-12 DIAGNOSIS — D649 Anemia, unspecified: Secondary | ICD-10-CM | POA: Diagnosis present

## 2019-04-12 DIAGNOSIS — J852 Abscess of lung without pneumonia: Principal | ICD-10-CM | POA: Diagnosis present

## 2019-04-12 DIAGNOSIS — U071 COVID-19: Secondary | ICD-10-CM | POA: Diagnosis present

## 2019-04-12 DIAGNOSIS — Z79899 Other long term (current) drug therapy: Secondary | ICD-10-CM

## 2019-04-12 DIAGNOSIS — K219 Gastro-esophageal reflux disease without esophagitis: Secondary | ICD-10-CM | POA: Diagnosis present

## 2019-04-12 DIAGNOSIS — Z7984 Long term (current) use of oral hypoglycemic drugs: Secondary | ICD-10-CM

## 2019-04-12 DIAGNOSIS — E785 Hyperlipidemia, unspecified: Secondary | ICD-10-CM | POA: Diagnosis present

## 2019-04-12 DIAGNOSIS — J9601 Acute respiratory failure with hypoxia: Secondary | ICD-10-CM | POA: Diagnosis present

## 2019-04-12 DIAGNOSIS — I2699 Other pulmonary embolism without acute cor pulmonale: Secondary | ICD-10-CM | POA: Diagnosis present

## 2019-04-12 DIAGNOSIS — R000 Tachycardia, unspecified: Secondary | ICD-10-CM | POA: Diagnosis not present

## 2019-04-12 DIAGNOSIS — E1151 Type 2 diabetes mellitus with diabetic peripheral angiopathy without gangrene: Secondary | ICD-10-CM | POA: Diagnosis present

## 2019-04-12 DIAGNOSIS — Z87891 Personal history of nicotine dependence: Secondary | ICD-10-CM

## 2019-04-12 DIAGNOSIS — Z8249 Family history of ischemic heart disease and other diseases of the circulatory system: Secondary | ICD-10-CM

## 2019-04-12 DIAGNOSIS — Z86711 Personal history of pulmonary embolism: Secondary | ICD-10-CM

## 2019-04-12 DIAGNOSIS — Z7902 Long term (current) use of antithrombotics/antiplatelets: Secondary | ICD-10-CM

## 2019-04-12 DIAGNOSIS — I10 Essential (primary) hypertension: Secondary | ICD-10-CM | POA: Diagnosis present

## 2019-04-12 DIAGNOSIS — N400 Enlarged prostate without lower urinary tract symptoms: Secondary | ICD-10-CM | POA: Diagnosis present

## 2019-04-12 DIAGNOSIS — Z7901 Long term (current) use of anticoagulants: Secondary | ICD-10-CM

## 2019-04-12 DIAGNOSIS — Z7982 Long term (current) use of aspirin: Secondary | ICD-10-CM

## 2019-04-12 MED ORDER — DEXAMETHASONE SODIUM PHOSPHATE 10 MG/ML IJ SOLN
10.0000 mg | Freq: Once | INTRAMUSCULAR | Status: AC
  Administered 2019-04-13: 10 mg via INTRAVENOUS
  Filled 2019-04-12: qty 1

## 2019-04-12 MED ORDER — SODIUM CHLORIDE 0.9 % IV BOLUS
1000.0000 mL | Freq: Once | INTRAVENOUS | Status: AC
  Administered 2019-04-13: 1000 mL via INTRAVENOUS

## 2019-04-12 NOTE — ED Provider Notes (Signed)
[facility] Emergency Department Provider Note _____________________   First MD Initiated Contact with Patient 04/12/19 2321     (approximate)  I have reviewed the triage vital signs and the nursing notes.   HISTORY  Chief Complaint Tachycardia    HPI Brad Frost is a 63 y.o. male with below list of previous medical conditions including diabetes mellitus AKI lung mass recently diagnosed COVID-19 on December 9 returns to the emergency department from [facility] where he resides secondary to tachycardia.  On arrival patient's heart rate 122 oxygen saturation currently 9394% on room air.  Patient denies any complaints.        Past Medical History:  Diagnosis Date  . Diabetes mellitus without complication (HCC)   . Peripheral vascular disease [facility](HCC)     Patient Active Problem List   Diagnosis Date Noted  . Hypotension   . COVID-19 virus infection 04/04/2019  . Pneumonia due to COVID-19 virus   . Bilateral pulmonary embolism (HCC)   . Lung mass   . AKI (acute kidney injury) (HCC)   . Type 2 diabetes mellitus with diabetic neuropathy, without long-term current use of insulin (HCC)   . PVD (peripheral vascular disease) (HCC) 02/14/2015    Past Surgical History:  Procedure Laterality Date  . LEG SURGERY  August 20, 2014   Right Leg  BPG    Prior to Admission medications   Medication Sig Start Date End Date Taking? Authorizing Provider  acetaminophen (TYLENOL) 500 MG tablet Take 500 mg by mouth every 6 (six) hours as needed.    [provider]  aspirin 81 MG tablet Take 81 mg by mouth daily.    [provider]  atorvastatin (LIPITOR) 80 MG tablet Take 80 mg by mouth daily.    [provider]  cholecalciferol (VITAMIN D) 25 MCG tablet Take 1 tablet (1,000 Units total) by mouth daily. 04/10/19   Arnetha CourserAmin, Sumayya, MD  cilostazol (PLETAL) 100 MG tablet Take 100 mg by mouth 2 (two) times daily.    [provider]  dexamethasone (DECADRON) 4 MG tablet Take 1 tablet (4 mg total) by mouth daily for 5 days. 04/10/19 04/15/19  Arnetha CourserAmin, Sumayya, MD  folic acid (FOLVITE) 1 MG tablet Take 1 mg by mouth daily.    [provider]  gabapentin (NEURONTIN) 300 MG capsule Take 600 mg by mouth 2 (two) times daily.     [provider]  metFORMIN (GLUCOPHAGE) 500 MG tablet Take 500 mg by mouth daily.    [provider]  metoprolol succinate (TOPROL-XL) 25 MG 24 hr tablet Take 1 tablet (25 mg total) by mouth daily. 04/09/19   Arnetha CourserAmin, Sumayya, MD  oxycodone (OXY-IR) 5 MG capsule Take 5 mg by mouth every 4 (four) hours as needed for pain.    [provider]  polyethylene glycol (MIRALAX / GLYCOLAX) 17 g packet Take 17 g by mouth 2 (two) times daily.    [provider]  Rivaroxaban (XARELTO) 15 MG TABS tablet Take 1 tablet (15 mg total) by mouth 2 (two) times daily for 18 days. 04/09/19 04/27/19  Arnetha CourserAmin, Sumayya, MD  rivaroxaban (XARELTO) 20 MG TABS tablet Take 1 tablet (20 mg total) by mouth daily with supper. Starting from January 1 after finishing twice daily dose. 04/26/19   Arnetha CourserAmin, Sumayya, MD  senna-docusate (SENOKOT-S) 8.6-50 MG tablet Take 1 tablet by mouth 2 (two) times daily.    [provider]  tamsulosin (FLOMAX) 0.4 MG CAPS capsule  Take 0.4 mg by mouth daily.    [provider]  thiamine (VITAMIN B-1) 100 MG tablet Take 200 mg by mouth daily.    [provider]  vitamin C (VITAMIN C) 1000 MG tablet Take 1 tablet (1,000 mg total) by mouth 2 (two) times daily. 04/09/19   Lorella Nimrod, MD  zinc sulfate 220 (50 Zn) MG capsule Take 220 mg by mouth 2 (two) times daily.    [provider]    Allergies Patient has no known allergies.  Family History  Problem Relation Age of Onset  . Hypertension Mother     Social History Social History   Tobacco Use  . Smoking status: Former Smoker    Types: Cigarettes  . Smokeless tobacco: Never Used   Substance Use Topics  . Alcohol use: No  . Drug use: No    Review of Systems Constitutional: No fever/chills Eyes: No visual changes. ENT: No sore throat. Cardiovascular: Denies chest pain.  Positive for tachycardia. Respiratory: Denies shortness of breath. Gastrointestinal: No abdominal pain.  No nausea, no vomiting.  No diarrhea.  No constipation. Genitourinary: Negative for dysuria. Musculoskeletal: Negative for neck pain.  Negative for back pain. Integumentary: Negative for rash. Neurological: Negative for headaches, focal weakness or numbness.  ____________________________________________   PHYSICAL EXAM:  VITAL SIGNS: ED Triage Vitals  Enc Vitals Group     BP 04/12/19 2252 106/74     Pulse Rate 04/12/19 2252 (!) 122     Resp 04/12/19 2252 20     Temp --      Temp src --      SpO2 04/12/19 2252 95 %     Weight 04/12/19 2253 83.9 kg (185 lb)     Height 04/12/19 2253 1.791 m (5' 10.5")     Head Circumference --      [facility] Flow --      Pain Score --      Pain Loc --      Pain Edu? --      Excl. in [place]? --     Constitutional: Alert and oriented.  Eyes: Conjunctivae are normal.  Mouth/Throat: Patient is wearing a mask. Neck: No stridor.  No meningeal signs.   Cardiovascular: Normal rate, regular rhythm. Good peripheral circulation. Grossly normal heart sounds. Respiratory: Tachypnea.  Bibasilar rhonchi no retractions. Gastrointestinal: Soft and nontender. No distention.  Musculoskeletal: No lower extremity tenderness nor edema. No gross deformities of extremities. Neurologic:  Normal speech and language. No gross focal neurologic deficits are appreciated.  Skin:  Skin is warm, dry and intact. Psychiatric: Mood and affect are normal. Speech and behavior are normal.  ____________________________________________   LABS (all labs ordered are listed, but only abnormal results are displayed)  Labs Reviewed  FIBRIN DERIVATIVES D-DIMER ([facility] ONLY) - Abnormal;  Notable for the following components:      Result Value   Fibrin derivatives D-dimer [facility]) 1,146.77 (*)    All other components within normal limits  FIBRINOGEN - Abnormal; Notable for the following components:   Fibrinogen >750 (*)    All other components within normal limits  CBC WITH DIFFERENTIAL/PLATELET - Abnormal; Notable for the following components:   WBC 17.4 (*)    Hemoglobin 12.4 (*)    HCT 37.2 (*)    RDW 16.7 (*)    Neutro Abs 13.3 (*)    Monocytes Absolute 2.3 (*)    Abs Immature Granulocytes 0.14 (*)    All other components within normal limits  CULTURE,  BLOOD (ROUTINE X 2)  CULTURE, BLOOD (ROUTINE X 2)  LACTIC ACID, PLASMA  LACTIC ACID, PLASMA  CBC WITH DIFFERENTIAL/PLATELET  TRIGLYCERIDES  C-REACTIVE PROTEIN  COMPREHENSIVE METABOLIC PANEL  FERRITIN  LACTATE DEHYDROGENASE  PROCALCITONIN   ____________________________________________  EKG  ED ECG REPORT I, Stephens N Elain Wixon, the attending physician, personally viewed and interpreted this ECG.   Date: 04/12/2019  EKG Time: 10:46 PM  Rate: 123  Rhythm: Sinus tachycardia  Axis: Normal  Intervals:Normal  ST&T Change: None  ____________________________________________  RADIOLOGY I, [address] Lane N Ailis Rigaud, personally viewed and evaluated these images (plain radiographs) as part of my medical decision making, as well as reviewing the written report by the radiologist.  ED MD interpretation:  *More confluent airspace disease in the left upper lobe region of previously known cavitary lesion persistent consolidation in left lower lobe with a small left-sided pleural effusion  Official radiology report(s): DG Chest Port 1 View  Result Date: 04/12/2019 CLINICAL DATA:  Dyspnea EXAM: PORTABLE CHEST 1 VIEW COMPARISON:  April 04, 2019 FINDINGS: Again noted is a mass in the left upper lobe with surrounding adjacent atelectasis or consolidation. This is more conspicuous on today's exam. There is space opacities at the  left lung base. There is a small left-sided pleural effusion. There is an azygos lobe. There is no pneumothorax. There is no acute osseous abnormality. The heart size is normal. IMPRESSION: 1. More confluent airspace disease in the left upper lobe in the region of the patient's previously demonstrated cavitary lesion. This may represent worsening consolidation or atelectasis. 2. Persistent consolidation in the left lower lobe with a small left-sided pleural effusion. Electronically Signed   By: Katherine Mantle M.D.   On: 04/12/2019 23:53    ____________________________________________   PROCEDURES   Procedure(s) performed (including Critical Care):  Procedures   ____________________________________________   INITIAL IMPRESSION / MDM / ASSESSMENT AND PLAN / ED COURSE  As part of my medical decision making, I reviewed the following data within the electronic MEDICAL RECORD NUMBER   63 year old male presented with above-stated history and physical exam secondary to tachycardia.  Patient noted to have hypoxia while in the emergency department with O2 saturation 91% at rest.  Given COVID-19 diagnosis as well as bilateral pulmonary emboli and lung mass/cavitary lesion with now hypoxia patient discussed with hospitalist for hospital admission for further evaluation and management.  Patient discussed with Dr.Mansy for hospital admission for further evaluation and management     ____________________________________________  FINAL CLINICAL IMPRESSION(S) / ED DIAGNOSES  Final diagnoses:  COVID-19 virus infection  Bilateral pulmonary embolism (HCC)     MEDICATIONS GIVEN DURING THIS VISIT:  Medications  dexamethasone (DECADRON) injection 10 mg (has no administration in time range)  sodium chloride 0.9 % bolus 1,000 mL (has no administration in time range)     ED Discharge Orders    None      *Please note:  RAINIER FEUERBORN was evaluated in Emergency Department on 04/13/2019 for the  symptoms described in the history of present illness. He was evaluated in the context of the global COVID-19 pandemic, which necessitated consideration that the patient might be at risk for infection with the SARS-CoV-2 virus that causes COVID-19. Institutional protocols and algorithms that pertain to the evaluation of patients at risk for COVID-19 are in a state of rapid change based on information released by regulatory bodies including the CDC and federal and state organizations. These policies and algorithms were followed during the patient's care in the ED.  Some ED evaluations and interventions may be delayed as a result of limited staffing during the pandemic.*  Note:  This document was prepared using Dragon voice recognition software and may include unintentional dictation errors.   Darci Current, MD 04/13/19 [phone]

## 2019-04-12 NOTE — ED Triage Notes (Signed)
Pt arrives to ED from [facility] via [facility] with c/c of tachycardia. Pt has Covid positive Dx from Dec 9th ED visit. Pt was sent by facility doctor due to tachycardia. Pt states he is feeling "ok". EMS reports transport vitals of p120. 114/70, 96% on room air. Upon arrival, pt is A&Ox4, NAD, no respiratory Sx noted.

## 2019-04-13 ENCOUNTER — Inpatient Hospital Stay
Admit: 2019-04-13 | Discharge: 2019-04-13 | Disposition: A | Discharge status: Home / Self Care | Payer: No Typology Code available for payment source | Attending: Family Medicine | Admitting: Family Medicine

## 2019-04-13 ENCOUNTER — Encounter: Payer: Self-pay | Admitting: Family Medicine

## 2019-04-13 ENCOUNTER — Inpatient Hospital Stay: Payer: No Typology Code available for payment source

## 2019-04-13 DIAGNOSIS — J984 Other disorders of lung: Secondary | ICD-10-CM | POA: Diagnosis not present

## 2019-04-13 DIAGNOSIS — Z7902 Long term (current) use of antithrombotics/antiplatelets: Secondary | ICD-10-CM | POA: Diagnosis not present

## 2019-04-13 DIAGNOSIS — Z79899 Other long term (current) drug therapy: Secondary | ICD-10-CM | POA: Diagnosis not present

## 2019-04-13 DIAGNOSIS — Z7982 Long term (current) use of aspirin: Secondary | ICD-10-CM | POA: Diagnosis not present

## 2019-04-13 DIAGNOSIS — D649 Anemia, unspecified: Secondary | ICD-10-CM | POA: Diagnosis present

## 2019-04-13 DIAGNOSIS — J852 Abscess of lung without pneumonia: Secondary | ICD-10-CM | POA: Diagnosis present

## 2019-04-13 DIAGNOSIS — J189 Pneumonia, unspecified organism: Secondary | ICD-10-CM | POA: Diagnosis not present

## 2019-04-13 DIAGNOSIS — U071 COVID-19: Secondary | ICD-10-CM | POA: Diagnosis not present

## 2019-04-13 DIAGNOSIS — Z8249 Family history of ischemic heart disease and other diseases of the circulatory system: Secondary | ICD-10-CM | POA: Diagnosis not present

## 2019-04-13 DIAGNOSIS — E785 Hyperlipidemia, unspecified: Secondary | ICD-10-CM | POA: Diagnosis present

## 2019-04-13 DIAGNOSIS — J1289 Other viral pneumonia: Secondary | ICD-10-CM

## 2019-04-13 DIAGNOSIS — Z7901 Long term (current) use of anticoagulants: Secondary | ICD-10-CM | POA: Diagnosis not present

## 2019-04-13 DIAGNOSIS — J9601 Acute respiratory failure with hypoxia: Secondary | ICD-10-CM

## 2019-04-13 DIAGNOSIS — Z86711 Personal history of pulmonary embolism: Secondary | ICD-10-CM | POA: Diagnosis not present

## 2019-04-13 DIAGNOSIS — Z87891 Personal history of nicotine dependence: Secondary | ICD-10-CM | POA: Diagnosis not present

## 2019-04-13 DIAGNOSIS — I10 Essential (primary) hypertension: Secondary | ICD-10-CM | POA: Diagnosis present

## 2019-04-13 DIAGNOSIS — I2699 Other pulmonary embolism without acute cor pulmonale: Secondary | ICD-10-CM | POA: Diagnosis not present

## 2019-04-13 DIAGNOSIS — Z7984 Long term (current) use of oral hypoglycemic drugs: Secondary | ICD-10-CM | POA: Diagnosis not present

## 2019-04-13 DIAGNOSIS — E1151 Type 2 diabetes mellitus with diabetic peripheral angiopathy without gangrene: Secondary | ICD-10-CM | POA: Diagnosis present

## 2019-04-13 DIAGNOSIS — R000 Tachycardia, unspecified: Secondary | ICD-10-CM | POA: Diagnosis present

## 2019-04-13 DIAGNOSIS — N400 Enlarged prostate without lower urinary tract symptoms: Secondary | ICD-10-CM | POA: Diagnosis present

## 2019-04-13 DIAGNOSIS — E876 Hypokalemia: Secondary | ICD-10-CM | POA: Diagnosis present

## 2019-04-13 DIAGNOSIS — K219 Gastro-esophageal reflux disease without esophagitis: Secondary | ICD-10-CM | POA: Diagnosis present

## 2019-04-13 DIAGNOSIS — A419 Sepsis, unspecified organism: Secondary | ICD-10-CM

## 2019-04-13 LAB — CBC WITH DIFFERENTIAL/PLATELET
Abs Immature Granulocytes: 0.14 10*3/uL — ABNORMAL HIGH (ref 0.00–0.07)
Basophils Absolute: 0 10*3/uL (ref 0.0–0.1)
Basophils Relative: 0 %
Eosinophils Absolute: 0.1 10*3/uL (ref 0.0–0.5)
Eosinophils Relative: 0 %
HCT: 37.2 % — ABNORMAL LOW (ref 39.0–52.0)
Hemoglobin: 12.4 g/dL — ABNORMAL LOW (ref 13.0–17.0)
Immature Granulocytes: 1 %
Lymphocytes Relative: 10 %
Lymphs Abs: 1.7 10*3/uL (ref 0.7–4.0)
MCH: 28.8 pg (ref 26.0–34.0)
MCHC: 33.3 g/dL (ref 30.0–36.0)
MCV: 86.5 fL (ref 80.0–100.0)
Monocytes Absolute: 2.3 10*3/uL — ABNORMAL HIGH (ref 0.1–1.0)
Monocytes Relative: 13 %
Neutro Abs: 13.3 10*3/uL — ABNORMAL HIGH (ref 1.7–7.7)
Neutrophils Relative %: 76 %
Platelets: 344 10*3/uL (ref 150–400)
RBC: 4.3 MIL/uL (ref 4.22–5.81)
RDW: 16.7 % — ABNORMAL HIGH (ref 11.5–15.5)
WBC: 17.4 10*3/uL — ABNORMAL HIGH (ref 4.0–10.5)
nRBC: 0 % (ref 0.0–0.2)

## 2019-04-13 LAB — COMPREHENSIVE METABOLIC PANEL
ALT: 87 U/L — ABNORMAL HIGH (ref 0–44)
AST: 31 U/L (ref 15–41)
Albumin: 2.1 g/dL — ABNORMAL LOW (ref 3.5–5.0)
Alkaline Phosphatase: 132 U/L — ABNORMAL HIGH (ref 38–126)
Anion gap: 13 (ref 5–15)
BUN: 15 mg/dL (ref 8–23)
CO2: 21 mmol/L — ABNORMAL LOW (ref 22–32)
Calcium: 8.6 mg/dL — ABNORMAL LOW (ref 8.9–10.3)
Chloride: 109 mmol/L (ref 98–111)
Creatinine, Ser: 0.73 mg/dL (ref 0.61–1.24)
GFR calc Af Amer: >60 mL/min (ref >60)
GFR calc non Af Amer: >60 mL/min (ref >60)
Glucose, Bld: 117 mg/dL — ABNORMAL HIGH (ref 70–99)
Potassium: 3.2 mmol/L — ABNORMAL LOW (ref 3.5–5.1)
Sodium: 143 mmol/L (ref 135–145)
Total Bilirubin: 1.6 mg/dL — ABNORMAL HIGH (ref 0.3–1.2)
Total Protein: 7.3 g/dL (ref 6.5–8.1)

## 2019-04-13 LAB — LACTIC ACID, PLASMA: Lactic Acid, Venous: 1.5 mmol/L (ref 0.5–1.9)

## 2019-04-13 LAB — FIBRIN DERIVATIVES D-DIMER (ARMC ONLY): Fibrin derivatives D-dimer (ARMC): 1146.77 ng/mL (FEU) — ABNORMAL HIGH (ref 0.00–499.00)

## 2019-04-13 LAB — PROCALCITONIN: Procalcitonin: 0.12 ng/mL

## 2019-04-13 LAB — LACTATE DEHYDROGENASE: LDH: 161 U/L (ref 98–192)

## 2019-04-13 LAB — TRIGLYCERIDES: Triglycerides: 110 mg/dL (ref <150)

## 2019-04-13 LAB — ECHOCARDIOGRAM COMPLETE
Height: 70.5 in
Weight: 2960 oz

## 2019-04-13 LAB — GLUCOSE, CAPILLARY
Glucose-Capillary: 176 mg/dL — ABNORMAL HIGH (ref 70–99)
Glucose-Capillary: 140 mg/dL — ABNORMAL HIGH (ref 70–99)

## 2019-04-13 LAB — FIBRINOGEN: Fibrinogen: >750 mg/dL — ABNORMAL HIGH (ref 210–475)

## 2019-04-13 LAB — MAGNESIUM: Magnesium: 2.1 mg/dL (ref 1.7–2.4)

## 2019-04-13 LAB — FERRITIN: Ferritin: 588 ng/mL — ABNORMAL HIGH (ref 24–336)

## 2019-04-13 MED ORDER — GABAPENTIN 300 MG PO CAPS
600.0000 mg | ORAL_CAPSULE | Freq: Two times a day (BID) | ORAL | Status: DC
  Filled 2019-04-13 (×2): qty 2

## 2019-04-13 MED ORDER — RIVAROXABAN 20 MG PO TABS: 20.0000 mg | ORAL_TABLET | Freq: Every day | ORAL | Status: DC

## 2019-04-13 MED ORDER — ACETAMINOPHEN 500 MG PO TABS: 500.0000 mg | ORAL_TABLET | Freq: Four times a day (QID) | ORAL | Status: DC | PRN

## 2019-04-13 MED ORDER — SODIUM CHLORIDE 0.9 % IV SOLN
200.0000 mg | Freq: Once | INTRAVENOUS | Status: DC
  Filled 2019-04-13: qty 40

## 2019-04-13 MED ORDER — FOLIC ACID 1 MG PO TABS
1.0000 mg | ORAL_TABLET | Freq: Every day | ORAL | Status: DC
  Filled 2019-04-13 (×2): qty 1

## 2019-04-13 MED ORDER — METOPROLOL SUCCINATE ER 50 MG PO TB24
25.0000 mg | ORAL_TABLET | Freq: Every day | ORAL | Status: DC
  Filled 2019-04-13 (×2): qty 1

## 2019-04-13 MED ORDER — ZINC SULFATE 220 (50 ZN) MG PO CAPS
220.0000 mg | ORAL_CAPSULE | Freq: Every day | ORAL | Status: DC
  Administered 2019-04-14: 220 mg via ORAL
  Filled 2019-04-13 (×2): qty 1

## 2019-04-13 MED ORDER — VANCOMYCIN HCL IN DEXTROSE 1-5 GM/200ML-% IV SOLN
1000.0000 mg | Freq: Once | INTRAVENOUS | Status: AC
  Administered 2019-04-13: 1000 mg via INTRAVENOUS
  Filled 2019-04-13: qty 200

## 2019-04-13 MED ORDER — POLYETHYLENE GLYCOL 3350 17 G PO PACK
17.0000 g | PACK | Freq: Two times a day (BID) | ORAL | Status: DC
  Filled 2019-04-13 (×2): qty 1

## 2019-04-13 MED ORDER — POTASSIUM CHLORIDE 20 MEQ PO PACK: 40.0000 meq | PACK | Freq: Once | ORAL | Status: DC

## 2019-04-13 MED ORDER — ATORVASTATIN CALCIUM 20 MG PO TABS: 80.0000 mg | ORAL_TABLET | Freq: Every day | ORAL | Status: DC

## 2019-04-13 MED ORDER — SENNOSIDES-DOCUSATE SODIUM 8.6-50 MG PO TABS
1.0000 | ORAL_TABLET | Freq: Two times a day (BID) | ORAL | Status: DC
  Filled 2019-04-13 (×2): qty 1

## 2019-04-13 MED ORDER — ASCORBIC ACID 500 MG PO TABS
1000.0000 mg | ORAL_TABLET | Freq: Every day | ORAL | Status: DC
  Administered 2019-04-14: 1000 mg via ORAL
  Filled 2019-04-13 (×2): qty 2

## 2019-04-13 MED ORDER — VANCOMYCIN HCL 750 MG/150ML IV SOLN
750.0000 mg | Freq: Once | INTRAVENOUS | Status: AC
  Administered 2019-04-13: 750 mg via INTRAVENOUS
  Filled 2019-04-13: qty 150

## 2019-04-13 MED ORDER — VITAMIN D3 25 MCG (1000 UNIT) PO TABS
1000.0000 [IU] | ORAL_TABLET | Freq: Every day | ORAL | Status: DC
  Filled 2019-04-13 (×4): qty 1

## 2019-04-13 MED ORDER — TAMSULOSIN HCL 0.4 MG PO CAPS
0.4000 mg | ORAL_CAPSULE | Freq: Every day | ORAL | Status: DC
  Filled 2019-04-13 (×2): qty 1

## 2019-04-13 MED ORDER — VANCOMYCIN HCL 1250 MG/250ML IV SOLN
1250.0000 mg | Freq: Two times a day (BID) | INTRAVENOUS | Status: DC
  Filled 2019-04-13 (×2): qty 250

## 2019-04-13 MED ORDER — ASPIRIN EC 81 MG PO TBEC
81.0000 mg | DELAYED_RELEASE_TABLET | Freq: Every day | ORAL | Status: DC
  Filled 2019-04-13 (×2): qty 1

## 2019-04-13 MED ORDER — INSULIN ASPART 100 UNIT/ML ~~LOC~~ SOLN: 0.0000 [IU] | Freq: Every day | SUBCUTANEOUS | Status: DC

## 2019-04-13 MED ORDER — DEXAMETHASONE SODIUM PHOSPHATE 10 MG/ML IJ SOLN: 6.0000 mg | INTRAMUSCULAR | Status: DC

## 2019-04-13 MED ORDER — METFORMIN HCL 500 MG PO TABS
500.0000 mg | ORAL_TABLET | Freq: Every day | ORAL | Status: DC
  Filled 2019-04-13: qty 1

## 2019-04-13 MED ORDER — VANCOMYCIN HCL IN DEXTROSE 1-5 GM/200ML-% IV SOLN: 1000.0000 mg | Freq: Once | INTRAVENOUS | Status: DC

## 2019-04-13 MED ORDER — PIPERACILLIN-TAZOBACTAM 3.375 G IVPB
3.3750 g | Freq: Three times a day (TID) | INTRAVENOUS | Status: DC
  Administered 2019-04-13 (×2): 3.375 g via INTRAVENOUS
  Filled 2019-04-13 (×2): qty 50

## 2019-04-13 MED ORDER — FAMOTIDINE 20 MG PO TABS
20.0000 mg | ORAL_TABLET | Freq: Two times a day (BID) | ORAL | Status: DC
  Filled 2019-04-13 (×2): qty 1

## 2019-04-13 MED ORDER — IOHEXOL 350 MG/ML SOLN
75.0000 mL | Freq: Once | INTRAVENOUS | Status: AC | PRN
  Administered 2019-04-13: 75 mL via INTRAVENOUS

## 2019-04-13 MED ORDER — CILOSTAZOL 100 MG PO TABS
100.0000 mg | ORAL_TABLET | Freq: Two times a day (BID) | ORAL | Status: DC
  Filled 2019-04-13 (×4): qty 1

## 2019-04-13 MED ORDER — INSULIN ASPART 100 UNIT/ML ~~LOC~~ SOLN
0.0000 [IU] | Freq: Three times a day (TID) | SUBCUTANEOUS | Status: DC
  Administered 2019-04-13: 2 [IU] via SUBCUTANEOUS
  Administered 2019-04-13: 3 [IU] via SUBCUTANEOUS
  Filled 2019-04-13 (×2): qty 1

## 2019-04-13 MED ORDER — OXYCODONE HCL 5 MG PO TABS: 5.0000 mg | ORAL_TABLET | ORAL | Status: DC | PRN

## 2019-04-13 MED ORDER — SULFAMETHOXAZOLE-TRIMETHOPRIM 800-160 MG PO TABS
1.0000 | ORAL_TABLET | Freq: Two times a day (BID) | ORAL | Status: DC
  Administered 2019-04-14: 1 via ORAL
  Filled 2019-04-13: qty 1

## 2019-04-13 MED ORDER — RIVAROXABAN 15 MG PO TABS
15.0000 mg | ORAL_TABLET | Freq: Two times a day (BID) | ORAL | Status: DC
  Filled 2019-04-13 (×2): qty 1

## 2019-04-13 MED ORDER — POTASSIUM CHLORIDE 20 MEQ PO PACK
40.0000 meq | PACK | Freq: Once | ORAL | Status: DC
  Filled 2019-04-13: qty 2

## 2019-04-13 MED ORDER — SODIUM CHLORIDE 0.9 % IV SOLN: 100.0000 mg | Freq: Every day | INTRAVENOUS | Status: DC

## 2019-04-13 NOTE — Consult Note (Addendum)
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  Upon presentation to the emergency room, heart rate was 125 and respiratory 21 and pulse oximetry as above with normal blood pressure and temperature.  Labs revealed mild hypokalemia of 3.2 and CO2 was 21.  LDH came back 161 and ferritin 588 with lactic acid of 1.5 and procalcitonin 0.12.  CBC showed leukocytosis of 17.4 with neutrophilia as well as anemia.  Fibrin derivatives D-dimer was 1146.77.  Portable chest ray showed more confluent airspace disease in the left upper lobe in the region of the patient's previously demonstrated cavitary lesion.  This may represent worsening consolidation or atelectasis.  There was persistent consolidation in the left lower lobe with a small left pleural effusion.  Chest CTA showed decreased clot burden with no new pulmonary emboli.  It showed new much larger cavitary lesion in the medial aspect of the left upper lobe with concern about neoplasm and differential diagnosis including infection or infarct.  Left lower lobe airspace disease was improving. Patient is stable during my evaluation without tachycardia and is on room air.     PAST MEDICAL HISTORY   Past Medical History:  Diagnosis Date  . Diabetes mellitus without complication (HCC)   . Peripheral vascular disease (HCC)      SURGICAL HISTORY   Past Surgical History:  Procedure Laterality Date  . LEG  SURGERY  August 20, 2014   Right Leg  BPG     FAMILY HISTORY   Family History  Problem Relation Age of Onset  . Hypertension Mother      SOCIAL HISTORY   Social History   Tobacco Use  . Smoking status: Former Smoker    Types: Cigarettes  . Smokeless tobacco: Never Used  Substance Use Topics  . Alcohol use: No  . Drug use: No     MEDICATIONS    Home Medication:  Current Outpatient Rx  . Order #: [id number] Class: Historical Med  . Order #: [id number] Class: No Print  . Order #: [id number] Class: Historical Med  . Order #: [id number] Class: No Print  . Order #: [id number] Class: Historical Med  . Order #: [id number] Class: Historical Med  . Order #: [id number] Class: Historical Med  . Order #: [id number] Class: No Print  . Order #: [id number] Class: Historical Med  . Order #: [id number] Class: Historical Med  . Order #: [id number] Class: No Print  . Order #: [id number] Class: Historical Med  . Order #: [id number] Class: Historical Med  . Order #: [id number] Class: No Print  . Order #: [id number] Class: Historical Med  . Order #: [id number] Class: Historical Med  . Order #: [id number] Class: Historical Med  . [START ON 04/26/2019] Order #: [id number] Class: No Print  . Order #: [id number] Class: Historical Med    Current Medication:  Current Facility-Administered Medications:  .  acetaminophen (TYLENOL) tablet 500 mg, 500 mg, Oral, Q6H PRN,  Danford, Earl Liteshristopher P, MD .  ascorbic acid (VITAMIN C) tablet 1,000 mg, 1,000 mg, Oral, Daily, Mansy, Jan A, MD .  aspirin EC tablet 81 mg, 81 mg, Oral, Daily, Danford, Earl Liteshristopher P, MD .  atorvastatin (LIPITOR) tablet 80 mg, 80 mg, Oral, Daily, Danford, Earl Liteshristopher P, MD .  cholecalciferol (VITAMIN D) tablet 1,000 Units, 1,000 Units, Oral, Daily, Mansy, Jan A, MD .  cilostazol (PLETAL) tablet 100 mg, 100 mg, Oral, BID, Danford, Earl Liteshristopher P, MD .  famotidine (PEPCID) tablet 20 mg, 20 mg, Oral, BID, Mansy, Jan A, MD .  folic acid (FOLVITE) tablet 1 mg, 1  mg, Oral, Daily, Danford, Christopher P, MD .  gabapentin (NEURONTIN) capsule 600 mg, 600 mg, Oral, BID, Danford, Christopher P, MD .  insulin aspart (novoLOG) injection 0-15 Units, 0-15 Units, Subcutaneous, TID WC, Danford, Earl Liteshristopher P, MD, 3 Units at 04/13/19 1208 .  insulin aspart (novoLOG) injection 0-5 Units, 0-5 Units, Subcutaneous, QHS, Danford, Christopher P, MD .  metoprolol succinate (TOPROL-XL) 24 hr tablet 25 mg, 25 mg, Oral, Daily, Danford, Christopher P, MD .  oxyCODONE (Oxy IR/ROXICODONE) immediate release tablet 5 mg, 5 mg, Oral, Q4H PRN, Danford, Christopher P, MD .  piperacillin-tazobactam (ZOSYN) IVPB 3.375 g, 3.375 g, Intravenous, Q8H, Danford, Christopher P, MD, Last Rate: 12.5 mL/hr at 04/13/19 1010, 3.375 g at 04/13/19 1010 .  polyethylene glycol (MIRALAX / GLYCOLAX) packet 17 g, 17 g, Oral, BID, Danford, Christopher P, MD .  potassium chloride (KLOR-CON) packet 40 mEq, 40 mEq, Oral, Once, Danford, Earl Liteshristopher P, MD .  Rivaroxaban (XARELTO) tablet 15 mg, 15 mg, Oral, BID, Danford, Earl Liteshristopher P, MD .  Melene Muller[START ON 04/28/2019] rivaroxaban (XARELTO) tablet 20 mg, 20 mg, Oral, Q supper, Danford, Earl Liteshristopher P, MD .  senna-docusate (Senokot-S) tablet 1 tablet, 1 tablet, Oral, BID, Danford, Earl Liteshristopher P, MD .  tamsulosin (FLOMAX) capsule 0.4 mg, 0.4 mg, Oral, Daily, Danford, Christopher P, MD .  vancomycin (VANCOREADY) IVPB 1250 mg/250 mL, 1,250 mg, Intravenous, Q12H, Danford, Christopher P, MD .  zinc sulfate capsule 220 mg, 220 mg, Oral, Daily, Mansy, Jan A, MD  Current Outpatient Medications:  .  aspirin 81 MG tablet, Take 81 mg by mouth daily., Disp: , Rfl:  .  cholecalciferol (VITAMIN D) 25 MCG tablet, Take 1 tablet (1,000 Units total) by mouth daily., Disp:  , Rfl:  .  cilostazol (PLETAL) 100 MG tablet, Take 100 mg by mouth 2 (two) times daily., Disp: , Rfl:  .  dexamethasone (DECADRON) 4 MG tablet, Take 1 tablet (4 mg total) by mouth daily for 5 days., Disp: 5 tablet, Rfl:  0 .  folic acid (FOLVITE) 1 MG tablet, Take 1 mg by mouth daily., Disp: , Rfl:  .  gabapentin (NEURONTIN) 300 MG capsule, Take 600 mg by mouth 2 (two) times daily. , Disp: , Rfl:  .  metFORMIN (GLUCOPHAGE) 500 MG tablet, Take 500 mg by mouth daily., Disp: , Rfl:  .  metoprolol succinate (TOPROL-XL) 25 MG 24 hr tablet, Take 1 tablet (25 mg total) by mouth daily., Disp:  , Rfl:  .  oxycodone (OXY-IR) 5 MG capsule, Take 5 mg by mouth every 4 (four) hours as needed for pain., Disp: , Rfl:  .  polyethylene glycol (MIRALAX / GLYCOLAX) 17 g packet, Take 17 g by mouth 2 (two) times daily., Disp: , Rfl:  .  Rivaroxaban (XARELTO) 15 MG TABS tablet, Take 1 tablet (15 mg total) by mouth 2 (two) times daily for 18 days., Disp: 36  tablet, Rfl: 0 .  senna-docusate (SENOKOT-S) 8.6-50 MG tablet, Take 1 tablet by mouth 2 (two) times daily., Disp: , Rfl:  .  thiamine (VITAMIN B-1) 100 MG tablet, Take 200 mg by mouth daily., Disp: , Rfl:  .  vitamin C (VITAMIN C) 1000 MG tablet, Take 1 tablet (1,000 mg total) by mouth 2 (two) times daily., Disp:  , Rfl:  .  zinc sulfate 220 (50 Zn) MG capsule, Take 220 mg by mouth 2 (two) times daily., Disp: , Rfl:  .  acetaminophen (TYLENOL) 500 MG tablet, Take 500 mg by mouth every 6 (six) hours as needed., Disp: , Rfl:  .  atorvastatin (LIPITOR) 80 MG tablet, Take 80 mg by mouth daily., Disp: , Rfl:  .  [START ON 04/26/2019] rivaroxaban (XARELTO) 20 MG TABS tablet, Take 1 tablet (20 mg total) by mouth daily with supper. Starting from January 1 after finishing twice daily dose., Disp: 30 tablet, Rfl:  .  tamsulosin (FLOMAX) 0.4 MG CAPS capsule, Take 0.4 mg by mouth daily., Disp: , Rfl:     ALLERGIES   Patient has no known allergies.     REVIEW OF SYSTEMS    Review of Systems:  Gen:  Denies  fever, sweats, chills weigh loss  HEENT: Denies blurred vision, double vision, ear pain, eye pain, hearing loss, nose bleeds, sore throat Cardiac:  No dizziness, chest pain or  heaviness, chest tightness,edema Resp:   Denies cough or sputum porduction, shortness of breath,wheezing, hemoptysis,  Gi: Denies swallowing difficulty, stomach pain, nausea or vomiting, diarrhea, constipation, bowel incontinence Gu:  Denies bladder incontinence, burning urine Ext:   Denies Joint pain, stiffness or swelling Skin: Denies  skin rash, easy bruising or bleeding or hives Endoc:  Denies polyuria, polydipsia , polyphagia or weight change Psych:   Denies depression, insomnia or hallucinations   Other:  All other systems negative   VS: BP 114/83   Pulse (!) 114   Temp 98.4 F (36.9 C) (Oral)   Resp (!) 21   Ht 5' 10.5" (1.791 m)   Wt 83.9 kg   SpO2 92%   BMI 26.17 kg/m      PHYSICAL EXAM    GENERAL:NAD, no fevers, chills, no weakness no fatigue HEAD: Normocephalic, atraumatic.  EYES: Pupils equal, round, reactive to light. Extraocular muscles intact. No scleral icterus.  MOUTH: Moist mucosal membrane. Dentition intact. No abscess noted.  EAR, NOSE, THROAT: Clear without exudates. No external lesions.  NECK: Supple. No thyromegaly. No nodules. No JVD.  PULMONARY: Diffuse coarse rhonchi right sided +wheezes CARDIOVASCULAR: S1 and S2. Regular rate and rhythm. No murmurs, rubs, or gallops. No edema. Pedal pulses 2+ bilaterally.  GASTROINTESTINAL: Soft, nontender, nondistended. No masses. Positive bowel sounds. No hepatosplenomegaly.  MUSCULOSKELETAL: No swelling, clubbing, or edema. Range of motion full in all extremities.  NEUROLOGIC: Cranial nerves II through XII are intact. No gross focal neurological deficits. Sensation intact. Reflexes intact.  SKIN: No ulceration, lesions, rashes, or cyanosis. Skin warm and dry. Turgor intact.  PSYCHIATRIC: Mood, affect within normal limits. The patient is awake, alert and oriented x 3. Insight, judgment intact.       IMAGING    CT Chest W Contrast  Result Date: 04/04/2019 CLINICAL DATA:  63 year old COVID-19 positive  patient who had an abnormal chest x-ray earlier today, possibly indicating LEFT-sided pneumonia. EXAM: CT CHEST WITH CONTRAST TECHNIQUE: Multidetector CT imaging of the chest was performed during intravenous contrast administration. CONTRAST:  66mL OMNIPAQUE IOHEXOL 300 MG/ML IV. COMPARISON:  No prior CT. Chest x-ray earlier same day and previously. FINDINGS: Respiratory motion blurred many of the images. Cardiovascular: Filling defects within the distal main pulmonary arteries bilaterally extending into the branches of the RIGHT UPPER LOBE, RIGHT MIDDLE LOBE, RIGHT LOWER LOBE and LEFT UPPER LOBE. Since the examination was not performed with angiographic technique, opacification of the arteries is not optimal. There is no evidence of RIGHT heart strain. Normal heart size. Moderate LAD and RIGHT coronary atherosclerosis. No pericardial effusion. Mild atherosclerosis involving the aortic arch without evidence of aneurysm. Mediastinum/Nodes: No pathologically enlarged mediastinal, hilar or axillary lymph nodes. No mediastinal masses. Normal-appearing esophagus. 5 mm nodule involving the UPPER pole the RIGHT lobe of the thyroid gland; remainder of the thyroid gland normal in appearance. Lungs/Pleura: Necrotic mass with irregular margins involving the anteroinferior LEFT UPPER LOBE, abutting the pleura, measuring approximately 3.2 x 2.6 x 2.9 cm (series 3/image 54 and series 5/image 51). No parenchymal nodules or masses elsewhere in either lung. Emphysematous changes diffusely throughout both lungs. Azygos fissure. Linear scar or atelectasis involving the LEFT LOWER LOBE and lingula. No pleural effusions. Confluent peripheral opacities deep in the POSTERIOR costophrenic sulcus of the LEFT LOWER LOBE. Central airways patent without significant bronchial wall thickening. Upper Abdomen: Phrygian cap involving the gallbladder which mimics a liver lesion. Visualized upper abdomen unremarkable. Musculoskeletal: Mild  degenerative changes involving the thoracic spine. No acute findings. IMPRESSION: 1. Bilateral pulmonary emboli involving the distal main pulmonary arteries bilaterally extending into the branches of the RIGHT UPPER LOBE, RIGHT MIDDLE LOBE, RIGHT LOWER LOBE and LEFT UPPER LOBE. 2. 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 3. Confluent airspace opacities deep in the POSTERIOR sulcus of the LEFT LOWER LOBE in the posterior costophrenic sulcus of the LEFT LOWER LOBE, atelectasis favored over pneumonia. 4. 5 mm nodule involving the upper pole the RIGHT lobe of the thyroid gland, statistically a benign adenoma. No followup recommended (ref: J Am Coll Radiol. 2015 Feb;12(2): 143-50). Aortic Atherosclerosis (ICD10-I70.0) and Emphysema (ICD10-J43.9). I telephoned these results at the time of interpretation on 04/04/2019 at 4:51 pm to provider Dorothea Glassman, MD of the emergency department, who verbally acknowledged these results. Electronically Signed   By: Hulan Saas M.D.   On: 04/04/2019 16:51   CT Angio Chest PE W and/or Wo Contrast  Result Date: 04/13/2019 CLINICAL DATA:  Shortness of breath. Recent pulmonary embolus. COVID-19 infection. EXAM: CT ANGIOGRAPHY CHEST WITH CONTRAST TECHNIQUE: Multidetector CT imaging of the chest was performed using the standard protocol during bolus administration of intravenous contrast. Multiplanar CT image reconstructions and MIPs were obtained to evaluate the vascular anatomy. CONTRAST:  1mL OMNIPAQUE IOHEXOL 350 MG/ML SOLN COMPARISON:  On the chest x-ray 04/12/2019. CT chest 04/04/2019. FINDINGS: Cardiovascular: The heart size is normal. Emboli the bifurcations of the right and left main pulmonary emboli are again seen. Clot burden has reduced. No new emboli are present. Aortic arch and great vessels demonstrate minimal calcification. Mediastinum/Nodes: No enlarged  mediastinal, hilar, or axillary lymph nodes. Thyroid gland, trachea, and esophagus demonstrate no significant findings. Lungs/Pleura: Previously noted cavitary lesion in the lingula is again noted. There is a new, much larger cavitary lesion in the medial aspect of the left upper lobe measuring 5.4 x 5.6 x 4.9 cm. Mild left lower lobe airspace opacities have improved. Minimal atelectasis is present at the right base. Azygos fissure is again noted. There is no pneumothorax. Minimal fluid is present  in the left base. Upper Abdomen: Visualized upper abdomen is unremarkable. Musculoskeletal: Vertebral body heights alignment are maintained. Ribs are unremarkable. No focal lytic or blastic lesions are present. Review of the MIP images confirms the above findings. IMPRESSION: 1. Decreased clot burden. 2. No new emboli. 3. New, much larger cavitary lesion in the medial aspect of the left upper lobe measuring 5.4 x 5.6 x 4.9 cm. There was concern for neoplasm on the previous chest CT. While this is still in the differential diagnosis, infection or infarct is also considered. 4. Improving dependent left lower lobe airspace disease. 5. Aortic Atherosclerosis (ICD10-I70.0). Electronically Signed   By: Marin Roberts M.D.   On: 04/13/2019 07:07   DG Chest Port 1 View  Result Date: 04/12/2019 CLINICAL DATA:  Dyspnea EXAM: PORTABLE CHEST 1 VIEW COMPARISON:  April 04, 2019 FINDINGS: Again noted is a mass in the left upper lobe with surrounding adjacent atelectasis or consolidation. This is more conspicuous on today's exam. There is space opacities at the left lung base. There is a small left-sided pleural effusion. There is an azygos lobe. There is no pneumothorax. There is no acute osseous abnormality. The heart size is normal. IMPRESSION: 1. More confluent airspace disease in the left upper lobe in the region of the patient's previously demonstrated cavitary lesion. This may represent worsening consolidation or  atelectasis. 2. Persistent consolidation in the left lower lobe with a small left-sided pleural effusion. Electronically Signed   By: Katherine Mantle M.D.   On: 04/12/2019 23:53   DG Chest Portable 1 View  Result Date: 04/04/2019 CLINICAL DATA:  Hypotension, COVID-19 positive EXAM: PORTABLE CHEST 1 VIEW COMPARISON:  None. FINDINGS: The heart size and mediastinal contours are within normal limits. Focal airspace opacities within the mid to lower aspects of the left long. No pleural effusion or pneumothorax. The visualized skeletal structures are unremarkable. IMPRESSION: Left mid to lower lung zone airspace opacities suspicious for pneumonia. Electronically Signed   By: Duanne Guess M.D.   On: 04/04/2019 12:48      ASSESSMENT/PLAN   Left upper lobe cavitary lesion   - infectious vs malignant vs infact related   - nasal MRSA screening    -respiratory culture    - TB GOLD quantiferon   - would treat on outpatient with bactrim ds bid and perform    -repeat bronchoscopy in 4 weeks   - fungitell serum    - Aspergillus ag -serum    - outpatient bronchoscopy with BAL and transbronchial biopsies  - considering ENB/EBUS   - patient is stable on room air with normal vitals during my evaluation.           Thank you for allowing me to participate in the care of this patient.    Patient/Family are satisfied with care plan and all questions have been answered.   This document was prepared using Dragon voice recognition software and may include unintentional dictation errors.     Vida Rigger, M.D.  Division of Pulmonary & Critical Care Medicine  [facility]

## 2019-04-13 NOTE — ED Notes (Signed)
Pt again refusing all labs with this RN. Pt states "can I just wait and watch the news". Pt not wanting any medications at this time but will allow IV meds and insulin to be given. Dr. Loleta Books notified.

## 2019-04-13 NOTE — ED Notes (Signed)
CT was unable to push contrast through the IV established by the IV team. Dr Owens Shark notified.

## 2019-04-13 NOTE — ED Notes (Signed)
Per Dr. Loleta Books, pt can come off cardiac monitoring. Pt also given vanilla icecream per Dr. Loleta Books.

## 2019-04-13 NOTE — ED Notes (Signed)
Pt given meal tray. States "I'm going to eat a cookie, drink this juice, and go to bed."

## 2019-04-13 NOTE — ED Notes (Signed)
2 unsuccessful US-guided IV attempts by this RN (right upper forearm and right AC).

## 2019-04-13 NOTE — ED Notes (Signed)
Lab asked to come stick patient for bloodwork.

## 2019-04-13 NOTE — Progress Notes (Addendum)
Pharmacy Antibiotic Note  Brad Frost is a 63 y.o. male admitted on 04/12/2019 with pneumonia.  Pharmacy has been consulted for Vancomycin and Zosyn dosing. Hx DM, lung mass, bilat PE Covid + test on 04/04/2019. From [facility]  Plan: -Zosyn 3.375 gm EI IV q8h -Vancomycin 1000 mg IV x 1 to be followed by 750mg  for a total of 1750mg  for Loading dose,  Then Vancomycin 1250 mg IV Q 12 hrs. Goal AUC 400-550. Expected AUC: 489 SCr used: 0.8 Cmin 13.4 F/u MRSA PCR and Scr while on Vanc/Zosyn   Height: 5' 10.5" (179.1 cm) Weight: 185 lb (83.9 kg) IBW/kg (Calculated) : 74.15  Temp (24hrs), Avg:98.4 F (36.9 C), Min:98.4 F (36.9 C), Max:98.4 F (36.9 C)  Recent Labs  Lab 04/08/19 0526 04/09/19 0557 04/13/19 0341  WBC 16.6* 15.5* 17.4*  CREATININE 0.58* 0.51* 0.73  LATICACIDVEN  --   --  1.5    Estimated Creatinine Clearance: 96.6 mL/min (by C-G formula based on SCr of 0.73 mg/dL).    No Known Allergies  Antimicrobials this admission: Zosyn 12/18 >>   Vanc 12/18 >>    Dose adjustments this admission:    Microbiology results: 12/18 BCx: pend   UCx:      Sputum:    12/18 MRSA PCR: pending  Thank you for allowing pharmacy to be a part of this patient's care.  Devona Holmes A 04/13/2019 9:24 AM

## 2019-04-13 NOTE — ED Notes (Signed)
Dr. Danford at bedside  

## 2019-04-13 NOTE — ED Notes (Signed)
Pt sitting on recliner at this time. NAD noted.

## 2019-04-13 NOTE — ED Notes (Signed)
Pt refusing all medications at this time. Explained reasons for meds.  Pt remains to decline. Dr danford notified.

## 2019-04-13 NOTE — ED Notes (Signed)
Pt refusing blood to be drawn from CRP/INR

## 2019-04-13 NOTE — H&P (Signed)
[place] at [facility]   PATIENT NAME: Brad Frost    MR#:  [id number]  DATE OF BIRTH:  07/02/1955  DATE OF ADMISSION:  04/12/2019  PRIMARY CARE PHYSICIAN: [name], [place], MD   REQUESTING/REFERRING PHYSICIAN: Bayard MalesBrown, [place], MD. CHIEF COMPLAINT:   Chief Complaint  Patient presents with  . Tachycardia    HISTORY OF PRESENT ILLNESS:  Brad Frost  is a 63 y.o. African-American male with a known history of recently diagnosed COVID-19 on 12/9 with associated bilateral pulmonary emboli, on Xarelto with history of type 2 diabetes mellitus and peripheral vascular disease, who presented to the emergency room from [facility] with acute onset of tachycardia noticed there with heart rate of 122 and O2 saturation that was 93% on room air and has occasionally dropped to 90% in the ER.  The patient admits to cough without significant dyspnea and denied wheezing.  No reported fever or chills.  He admits to nausea without vomiting or abdominal pain.  No dysuria, oliguria or hematuria or flank pain.  He denied any headache or dizziness or blurred vision.  No chest pain or palpitations.  Upon presentation to the emergency room, heart rate was 125 and respiratory 21 and pulse oximetry as above with normal blood pressure and temperature.  Labs revealed mild hypokalemia of 3.2 and CO2 was 21.  LDH came back 161 and ferritin 588 with lactic acid of 1.5 and procalcitonin 0.12.  CBC showed leukocytosis of 17.4 with neutrophilia as well as anemia.  Fibrin derivatives D-dimer was 1146.77.  Portable chest ray showed more confluent airspace disease in the left upper lobe in the region of the patient's previously demonstrated cavitary lesion.  This may represent worsening consolidation or atelectasis.  There was persistent consolidation in the left lower lobe with a small left pleural effusion.  Chest CTA showed decreased clot burden with no new pulmonary emboli.  It showed new much larger cavitary  lesion in the medial aspect of the left upper lobe with concern about neoplasm and differential diagnosis including infection or infarct.  Left lower lobe airspace disease was improving.  It showed aortic atherosclerosis.  The patient was given 10 mg of IV Decadron and 1 L bolus of IV normal saline.  He will be admitted to a medical monitored bed for further evaluation and management. PAST MEDICAL HISTORY:   Past Medical History:  Diagnosis Date  . Diabetes mellitus without complication (HCC)   . Peripheral vascular disease (HCC)     PAST SURGICAL HISTORY:   Past Surgical History:  Procedure Laterality Date  . LEG SURGERY  August 20, 2014   Right Leg  BPG    SOCIAL HISTORY:   Social History   Tobacco Use  . Smoking status: Former Smoker    Types: Cigarettes  . Smokeless tobacco: Never Used  Substance Use Topics  . Alcohol use: No    FAMILY HISTORY:   Family History  Problem Relation Age of Onset  . Hypertension Mother     DRUG ALLERGIES:  No Known Allergies  REVIEW OF SYSTEMS:   ROS As per history of present illness. All pertinent systems were reviewed above. Constitutional,  HEENT, cardiovascular, respiratory, GI, GU, musculoskeletal, neuro, psychiatric, endocrine,  integumentary and hematologic systems were reviewed and are otherwise  negative/unremarkable except for positive findings mentioned above in the HPI.   MEDICATIONS AT HOME:   Prior to Admission medications   Medication Sig Start Date End Date Taking? Authorizing Provider  acetaminophen (TYLENOL) 500  MG tablet Take 500 mg by mouth every 6 (six) hours as needed.    [provider]  aspirin 81 MG tablet Take 81 mg by mouth daily.    [provider]  atorvastatin (LIPITOR) 80 MG tablet Take 80 mg by mouth daily.    [provider]  cholecalciferol (VITAMIN D) 25 MCG tablet Take 1 tablet (1,000 Units total) by mouth daily. 04/10/19   Arnetha Courser, MD  cilostazol (PLETAL) 100  MG tablet Take 100 mg by mouth 2 (two) times daily.    [provider]  dexamethasone (DECADRON) 4 MG tablet Take 1 tablet (4 mg total) by mouth daily for 5 days. 04/10/19 04/15/19  Arnetha Courser, MD  folic acid (FOLVITE) 1 MG tablet Take 1 mg by mouth daily.    [provider]  gabapentin (NEURONTIN) 300 MG capsule Take 600 mg by mouth 2 (two) times daily.     [provider]  metFORMIN (GLUCOPHAGE) 500 MG tablet Take 500 mg by mouth daily.    [provider]  metoprolol succinate (TOPROL-XL) 25 MG 24 hr tablet Take 1 tablet (25 mg total) by mouth daily. 04/09/19   Arnetha Courser, MD  oxycodone (OXY-IR) 5 MG capsule Take 5 mg by mouth every 4 (four) hours as needed for pain.    [provider]  polyethylene glycol (MIRALAX / GLYCOLAX) 17 g packet Take 17 g by mouth 2 (two) times daily.    [provider]  Rivaroxaban (XARELTO) 15 MG TABS tablet Take 1 tablet (15 mg total) by mouth 2 (two) times daily for 18 days. 04/09/19 04/27/19  Arnetha Courser, MD  rivaroxaban (XARELTO) 20 MG TABS tablet Take 1 tablet (20 mg total) by mouth daily with supper. Starting from January 1 after finishing twice daily dose. 04/26/19   Arnetha Courser, MD  senna-docusate (SENOKOT-S) 8.6-50 MG tablet Take 1 tablet by mouth 2 (two) times daily.    [provider]  tamsulosin (FLOMAX) 0.4 MG CAPS capsule Take 0.4 mg by mouth daily.    [provider]  thiamine (VITAMIN B-1) 100 MG tablet Take 200 mg by mouth daily.    [provider]  vitamin C (VITAMIN C) 1000 MG tablet Take 1 tablet (1,000 mg total) by mouth 2 (two) times daily. 04/09/19   Arnetha Courser, MD  zinc sulfate 220 (50 Zn) MG capsule Take 220 mg by mouth 2 (two) times daily.    [provider]      VITAL SIGNS:  Blood pressure 127/86, pulse (!) 116, temperature 98.4 F (36.9 C), temperature source Oral, resp. rate (!) 21, height 5' 10.5" (1.791 m), weight 83.9 kg, SpO2 97  %.  PHYSICAL EXAMINATION:  Physical Exam  GENERAL:  63 y.o.-year-old pleasant African-American male patient lying in the bed in mild respiratory distress with conversational dyspnea. EYES: Pupils equal, round, reactive to light and accommodation. No scleral icterus. Extraocular muscles intact.  HEENT: Head atraumatic, normocephalic. Oropharynx and nasopharynx clear.  NECK:  Supple, no jugular venous distention. No thyroid enlargement, no tenderness.  LUNGS: Diminished bibasilar breath sounds with bibasal crackles as well as diminished left upper lung zone breath sounds. CARDIOVASCULAR: Regular rate and rhythm, S1, S2 normal. No murmurs, rubs, or gallops.  ABDOMEN: Soft, nondistended, nontender. Bowel sounds present. No organomegaly or mass.  EXTREMITIES: No pedal edema, cyanosis, or clubbing.  NEUROLOGIC: Cranial nerves II through XII are intact. Muscle strength 5/5 in all extremities. Sensation intact. Gait not checked.  PSYCHIATRIC: The patient  is alert and oriented x 3.  Normal affect and good eye contact. SKIN: No obvious rash, lesion, or ulcer.   LABORATORY PANEL:   CBC Recent Labs  Lab 04/13/19 0341  WBC 17.4*  HGB 12.4*  HCT 37.2*  PLT 344   ------------------------------------------------------------------------------------------------------------------  Chemistries  Recent Labs  Lab 04/13/19 0341  NA 143  K 3.2*  CL 109  CO2 21*  GLUCOSE 117*  BUN 15  CREATININE 0.73  CALCIUM 8.6*  MG 2.1  AST 31  ALT 87*  ALKPHOS 132*  BILITOT 1.6*   ------------------------------------------------------------------------------------------------------------------  Cardiac Enzymes No results for input(s): TROPONINI in the last 168 hours. ------------------------------------------------------------------------------------------------------------------  RADIOLOGY:  CT Angio Chest PE W and/or Wo Contrast  Result Date: 04/13/2019 CLINICAL DATA:  Shortness of breath.  Recent pulmonary embolus. COVID-19 infection. EXAM: CT ANGIOGRAPHY CHEST WITH CONTRAST TECHNIQUE: Multidetector CT imaging of the chest was performed using the standard protocol during bolus administration of intravenous contrast. Multiplanar CT image reconstructions and MIPs were obtained to evaluate the vascular anatomy. CONTRAST:  25mL OMNIPAQUE IOHEXOL 350 MG/ML SOLN COMPARISON:  On the chest x-ray 04/12/2019. CT chest 04/04/2019. FINDINGS: Cardiovascular: The heart size is normal. Emboli the bifurcations of the right and left main pulmonary emboli are again seen. Clot burden has reduced. No new emboli are present. Aortic arch and great vessels demonstrate minimal calcification. Mediastinum/Nodes: No enlarged mediastinal, hilar, or axillary lymph nodes. Thyroid gland, trachea, and esophagus demonstrate no significant findings. Lungs/Pleura: Previously noted cavitary lesion in the lingula is again noted. There is a new, much larger cavitary lesion in the medial aspect of the left upper lobe measuring 5.4 x 5.6 x 4.9 cm. Mild left lower lobe airspace opacities have improved. Minimal atelectasis is present at the right base. Azygos fissure is again noted. There is no pneumothorax. Minimal fluid is present in the left base. Upper Abdomen: Visualized upper abdomen is unremarkable. Musculoskeletal: Vertebral body heights alignment are maintained. Ribs are unremarkable. No focal lytic or blastic lesions are present. Review of the MIP images confirms the above findings. IMPRESSION: 1. Decreased clot burden. 2. No new emboli. 3. New, much larger cavitary lesion in the medial aspect of the left upper lobe measuring 5.4 x 5.6 x 4.9 cm. There was concern for neoplasm on the previous chest CT. While this is still in the differential diagnosis, infection or infarct is also considered. 4. Improving dependent left lower lobe airspace disease. 5. Aortic Atherosclerosis (ICD10-I70.0). Electronically Signed   By: San Morelle M.D.   On: 04/13/2019 07:07   DG Chest Port 1 View  Result Date: 04/12/2019 CLINICAL DATA:  Dyspnea EXAM: PORTABLE CHEST 1 VIEW COMPARISON:  April 04, 2019 FINDINGS: Again noted is a mass in the left upper lobe with surrounding adjacent atelectasis or consolidation. This is more conspicuous on today's exam. There is space opacities at the left lung base. There is a small left-sided pleural effusion. There is an azygos lobe. There is no pneumothorax. There is no acute osseous abnormality. The heart size is normal. IMPRESSION: 1. More confluent airspace disease in the left upper lobe in the region of the patient's previously demonstrated cavitary lesion. This may represent worsening consolidation or atelectasis. 2. Persistent consolidation in the left lower lobe with a small left-sided pleural effusion. Electronically Signed   By: Constance Holster M.D.   On: 04/12/2019 23:53      IMPRESSION AND PLAN:   1.  Covid 19 pneumonia with associated sepsis as manifested by tachycardia and  tachypnea with worsening acute hypoxemic respiratory failure.  The patient will be admitted to a medical monitored bed.  He will be continued on steroid therapy with IV Decadron specially given persistently elevated inflammatory markers.  O2 protocol will be followed.  Antibiotic regimen as below.  Pharmacy consult was placed for consideration of IV remdesivir.  2.  Left upper lobe large cavitary lesion with differential diagnosis including neoplastic, infectious etiology or infarction.  Given significant leukocytosis and associated sepsis the patient will be placed on broaden spectrum antibiotic therapy with IV Zosyn and vancomycin.  A pulmonary consultation will be obtained by Dr. Karna Christmas.  I notified him regarding the patient as the patient may need bronchoscopy for further assessment of this lesion.  I believe that this is deterioration of the patient's underlying infectious process.  I do not think that this  is healthcare associated pneumonia.  Aspiration could be in the differential diagnosis.  3.  Bilateral PE.  We ordered a chest CT to assess for clot burden and it fortunately is increased decreasing.  We will continue his Xarelto.  4.  Dyslipidemia.  Statin therapy will be resumed.  5.  Type diabetes mellitus.  The patient will be placed on supplement coverage with NovoLog.  6.  DVT prophylaxis.  This is covered with Xarelto.   All the records are reviewed and case discussed with ED provider. The plan of care was discussed in details with the patient (and family). I answered all questions. The patient agreed to proceed with the above mentioned plan. Further management will depend upon hospital course.   CODE STATUS: Full code  TOTAL TIME TAKING CARE OF THIS PATIENT: 55 minutes.    Hannah Beat M.D on 04/13/2019 at 9:38 AM  [facility]   From 7 PM-7 AM, contact night-coverage [email or web address]  CC: Primary care physician; [name], [name], MD   Note: This dictation was prepared with Dragon dictation along with smaller phrase technology. Any transcriptional errors that result from this process are unintentional.

## 2019-04-13 NOTE — ED Notes (Signed)
Pt given fresh drink. No other needs. Urinal at bedside.

## 2019-04-13 NOTE — Progress Notes (Signed)
*  PRELIMINARY RESULTS* Echocardiogram 2D Echocardiogram has been performed.  Sherrie Sport 04/13/2019, 2:27 PM

## 2019-04-13 NOTE — Consult Note (Signed)
Remdesivir - Pharmacy Brief Note  Patient Dx with COVID-19 on 12/9. Returned to ED with tachycardia and oxygen saturation 93%. Patient recently admitted for bilaterally PEs.    O:  ALT: 87 CXR: 1. More confluent airspace disease in the left upper lobe in the region of the patient's previously demonstrated cavitary lesion. This may represent worsening consolidation or atelectasis. 2. Persistent consolidation in the left lower lobe  SpO2: down to 87% on room air    A/P:  Remdesivir 200 mg IVPB once followed by 100 mg IVPB daily x 4 days.   Pernell Dupre, PharmD, BCPS Clinical Pharmacist 04/13/2019 6:45 AM

## 2019-04-13 NOTE — ED Notes (Signed)
Pt wanted to sit in chair beside bed. Pt helped into chair with x1 assist. Pt refuses to let nurses touch him, so this RN forced him to accept help. Pt reports needing to urinate but then is unable to and becomes occupied with moving to chair. Pt watching TV and will call nurse when ready to move back.

## 2019-04-13 NOTE — ED Notes (Addendum)
This RN went to check on pt and pt had slid out of chair and into floor. Pt sitting up straight and just unable to get himsefl off floor. Pt did not lose consciousness, hit head, or c/o any pain. MD notified. Pt assisted back into bed with Wilfred Lacy, NT.

## 2019-04-13 NOTE — ED Notes (Signed)
Pt refuses blood work once lab gets in room to stick patient.

## 2019-04-13 NOTE — ED Notes (Signed)
Pt given meal tray and ice water.

## 2019-04-13 NOTE — ED Notes (Signed)
Pt denies pain. NAD.  No needs at this time. Waiting on admit bed.  Unlabored in bed. Remains ST on monitor.

## 2019-04-13 NOTE — ED Notes (Signed)
Pt given sprite 

## 2019-04-13 NOTE — Progress Notes (Signed)
Contacted [facility] for [facility] to be faxed over due to paperwork being taken into pt room who is covid +. Still not have received fax atm.

## 2019-04-13 NOTE — Progress Notes (Signed)
PROGRESS NOTE    Brad Frost  OVF:[id number] DOB: 15-May-1955 DOA: 04/12/2019 PCP: [name], [name], MD      Brief Narrative:  Mr. Brad Frost is a 63 y.o. M with DM, peripheral vascular disease, smoking who presented with tachycardia.  Recently admitted here for bilateral PE, COVID-19.  Hemodynamically stable with PE, started on anticoagulation with Xarelto.  Received 5 days remdesivir and dexamethasone and was discharged to continue dexamethasone.  Incidentally noted to have necrotic lung mass during that hospitalization, seen by Pulm, will need outpatient follow up.  Now sent back from [facility] due to tachycardia.  In the ER, afebrile, HR 122, RR 19-23, SpO2 91-97% on room air.  CTA chest showed no new emboli, but did show new larger cavitary lesion on chest CT.  Lactate normal, WBC 17K.       Assessment & Plan:  Cavitary lung lesions, Possible Lung abscess -Obtain sputum and blood cultures -Consult Pulmonology -Start empiric vancomycin and Zosyn and Bactrim -Obtain MRSA nares -Fungitell, aspergillus, quant gold testing per Pulmononlogy    Tachycardia Due to lung abscess  Recent COVID infection Patient still within 21 day infection window, but has no active disease.  Just completed remdesivir during last hospitalization. -Hold steroids and remdesivir  Recent pulmonary embolism Once daily Xarelto to commence Jan 1 -Continue Xarelto, initiation dose   Peripheral vascular disease Essential hypertension BP controlled -Continue aspirin, atorvastatin, cilostazol -Continue metoprolol  Diabetes -Hold metformin  -Start SS corrections -Continue gabapentin  BPH -Continue Flomax  Hypokalemia -Continue potassium  GERD -Continue famotidine         DVT prophylaxis: N/A on Xarelto Code Status: FULL Family Communication:  MDM and disposition Plan: This is a no charge note.  For further details, please see H&P by my partner Dr. Arville Care from earlier today.   The below labs and imaging reports were reviewed and summarized above.    The patient was admitted with lung abscess suspected.    Objective: Vitals:   04/13/19 1645 04/13/19 1700 04/13/19 1715 04/13/19 1730  BP:  (!) 120/92  129/86  Pulse: (!) 108 (!) 109 (!) 109 (!) 109  Resp: 19 17 18 16   Temp:      TempSrc:      SpO2: 96% 96% 97% 99%  Weight:      Height:        Intake/Output Summary (Last 24 hours) at 04/13/2019 1807 Last data filed at 04/13/2019 04/15/2019 Gross per 24 hour  Intake 1000 ml  Output --  Net 1000 ml   Filed Weights   04/12/19 2253  Weight: 83.9 kg    Examination: The patient was seen and examined.      Data Reviewed: I have personally reviewed following labs and imaging studies:  CBC: Recent Labs  Lab 04/08/19 0526 04/09/19 0557 04/13/19 0341  WBC 16.6* 15.5* 17.4*  NEUTROABS  --   --  13.3*  HGB 11.4* 10.4* 12.4*  HCT 34.2* 32.0* 37.2*  MCV 88.6 91.7 86.5  PLT 218 206 344   Basic Metabolic Panel: Recent Labs  Lab 04/08/19 0526 04/09/19 0557 04/13/19 0341  NA 141 140 143  K 4.0 3.5 3.2*  CL 114* 112* 109  CO2 19* 20* 21*  GLUCOSE 131* 101* 117*  BUN 18 16 15   CREATININE 0.58* 0.51* 0.73  CALCIUM 8.6* 8.3* 8.6*  MG  --   --  2.1   GFR: Estimated Creatinine Clearance: 96.6 mL/min (by C-G formula based on SCr of 0.73 mg/dL). Liver  Function Tests: Recent Labs  Lab 04/09/19 0557 04/13/19 0341  AST 60* 31  ALT 69* 87*  ALKPHOS 109 132*  BILITOT 0.7 1.6*  PROT 6.0* 7.3  ALBUMIN 2.1* 2.1*   No results for input(s): LIPASE, AMYLASE in the last 168 hours. No results for input(s): AMMONIA in the last 168 hours. Coagulation Profile: No results for input(s): INR, PROTIME in the last 168 hours. Cardiac Enzymes: No results for input(s): CKTOTAL, CKMB, CKMBINDEX, TROPONINI in the last 168 hours. BNP (last 3 results) No results for input(s): PROBNP in the last 8760 hours. HbA1C: No results for input(s): HGBA1C in the last 72  hours. CBG: Recent Labs  Lab 04/08/19 2049 04/09/19 0859 04/09/19 1212 04/13/19 1157 04/13/19 1745  GLUCAP 106* 96 96 176* 140*   Lipid Profile: Recent Labs    04/13/19 0341  TRIG 110   Thyroid Function Tests: No results for input(s): TSH, T4TOTAL, FREET4, T3FREE, THYROIDAB in the last 72 hours. Anemia Panel: Recent Labs    04/13/19 0341  FERRITIN 588*   Urine analysis: No results found for: COLORURINE, APPEARANCEUR, LABSPEC, PHURINE, GLUCOSEU, HGBUR, BILIRUBINUR, KETONESUR, PROTEINUR, UROBILINOGEN, NITRITE, LEUKOCYTESUR Sepsis Labs: (procalcitonin:4,lacticacidven:4)  ) Recent Results (from the past 240 hour(s))  MRSA PCR Screening     Status: None   Collection Time: 04/05/19  9:18 PM   Specimen: Nasal Mucosa; Nasopharyngeal  Result Value Ref Range Status   MRSA by PCR NEGATIVE NEGATIVE Final    Comment:        The GeneXpert MRSA Assay (FDA approved for NASAL specimens only), is one component of a comprehensive MRSA colonization surveillance program. It is not intended to diagnose MRSA infection nor to guide or monitor treatment for MRSA infections. Performed at [facility], [address] [place], [place] [address]   Blood Culture (routine x 2)     Status: None (Preliminary result)   Collection Time: 04/13/19  1:37 AM   Specimen: BLOOD  Result Value Ref Range Status   Specimen Description BLOOD RIGHT ASSIST CONTROL  Final   Special Requests   Final    BOTTLES DRAWN AEROBIC AND ANAEROBIC Blood Culture results may not be optimal due to an inadequate volume of blood received in culture bottles   Culture   Final    NO GROWTH < 12 HOURS Performed at [facility], [address] [place], [place] [address]    Report Status PENDING  Incomplete  Blood Culture (routine x 2)     Status: None (Preliminary result)   Collection Time: 04/13/19  3:37 AM   Specimen: BLOOD  Result Value Ref Range Status   Specimen Description BLOOD  RIGHT ARM  Final   Special Requests   Final    BOTTLES DRAWN AEROBIC AND ANAEROBIC Blood Culture adequate volume   Culture   Final    NO GROWTH < 12 HOURS Performed at [facility], [address], [place], [place] [address]    Report Status PENDING  Incomplete         Radiology Studies: CT Angio Chest PE W and/or Wo Contrast  Result Date: 04/13/2019 CLINICAL DATA:  Shortness of breath. Recent pulmonary embolus. COVID-19 infection. EXAM: CT ANGIOGRAPHY CHEST WITH CONTRAST TECHNIQUE: Multidetector CT imaging of the chest was performed using the standard protocol during bolus administration of intravenous contrast. Multiplanar CT image reconstructions and MIPs were obtained to evaluate the vascular anatomy. CONTRAST:  75mL OMNIPAQUE IOHEXOL 350 MG/ML SOLN COMPARISON:  On the chest x-ray 04/12/2019. CT  chest 04/04/2019. FINDINGS: Cardiovascular: The heart size is normal. Emboli the bifurcations of the right and left main pulmonary emboli are again seen. Clot burden has reduced. No new emboli are present. Aortic arch and great vessels demonstrate minimal calcification. Mediastinum/Nodes: No enlarged mediastinal, hilar, or axillary lymph nodes. Thyroid gland, trachea, and esophagus demonstrate no significant findings. Lungs/Pleura: Previously noted cavitary lesion in the lingula is again noted. There is a new, much larger cavitary lesion in the medial aspect of the left upper lobe measuring 5.4 x 5.6 x 4.9 cm. Mild left lower lobe airspace opacities have improved. Minimal atelectasis is present at the right base. Azygos fissure is again noted. There is no pneumothorax. Minimal fluid is present in the left base. Upper Abdomen: Visualized upper abdomen is unremarkable. Musculoskeletal: Vertebral body heights alignment are maintained. Ribs are unremarkable. No focal lytic or blastic lesions are present. Review of the MIP images confirms the above findings. IMPRESSION: 1. Decreased clot burden. 2.  No new emboli. 3. New, much larger cavitary lesion in the medial aspect of the left upper lobe measuring 5.4 x 5.6 x 4.9 cm. There was concern for neoplasm on the previous chest CT. While this is still in the differential diagnosis, infection or infarct is also considered. 4. Improving dependent left lower lobe airspace disease. 5. Aortic Atherosclerosis (ICD10-I70.0). Electronically Signed   By: Marin Robertshristopher  Mattern M.D.   On: 04/13/2019 07:07   DG Chest Port 1 View  Result Date: 04/12/2019 CLINICAL DATA:  Dyspnea EXAM: PORTABLE CHEST 1 VIEW COMPARISON:  April 04, 2019 FINDINGS: Again noted is a mass in the left upper lobe with surrounding adjacent atelectasis or consolidation. This is more conspicuous on today's exam. There is space opacities at the left lung base. There is a small left-sided pleural effusion. There is an azygos lobe. There is no pneumothorax. There is no acute osseous abnormality. The heart size is normal. IMPRESSION: 1. More confluent airspace disease in the left upper lobe in the region of the patient's previously demonstrated cavitary lesion. This may represent worsening consolidation or atelectasis. 2. Persistent consolidation in the left lower lobe with a small left-sided pleural effusion. Electronically Signed   By: Katherine Mantlehristopher  Green M.D.   On: 04/12/2019 23:53   ECHOCARDIOGRAM COMPLETE  Result Date: 04/13/2019   ECHOCARDIOGRAM REPORT   [facility]   [facility] Date of Exam: 04/13/2019 Medical Rec #:  [id number]         Height: Accession #:    [id number]        Weight: Date of Birth:  08/21/1955          BSA: Patient Age:    63 years          BP:           114/83 mmHg Patient Gender: M                 HR:           114 bpm. Exam Location:  [facility] Procedure: 2D Echo, Color Doppler and Cardiac Doppler Indications:     Pulmonary embolus 415.19  History:         Patient has no prior history of Echocardiogram examinations.                  Risk Factors:Diabetes. PVD.   Sonographer:     Cristela BlueJerry Hege RDCS (AE) Referring Phys:  [id number] Earl LitesCHRISTOPHER P Savayah Waltrip Diagnosing Phys: Arnoldo HookerBruce Kowalski MD  Sonographer Comments: Technically difficult study due  to poor echo windows, no apical window and no subcostal window. Image acquisition challenging due to patient body habitus. IMPRESSIONS  1. Left ventricular ejection fraction, by visual estimation, is 60 to 65%. The left ventricle has normal function. There is no left ventricular hypertrophy.  2. The left ventricle has no regional wall motion abnormalities.  3. Global right ventricle has normal systolic function.The right ventricular size is normal. No increase in right ventricular wall thickness.  4. Left atrial size was normal.  5. Right atrial size was normal.  6. The mitral valve is normal in structure. Trivial mitral valve regurgitation.  7. The tricuspid valve is normal in structure. Tricuspid valve regurgitation is trivial.  8. The aortic valve is normal in structure. Aortic valve regurgitation is not visualized.  9. The pulmonic valve was normal in structure. Pulmonic valve regurgitation is not visualized. FINDINGS  Left Ventricle: Left ventricular ejection fraction, by visual estimation, is 60 to 65%. The left ventricle has normal function. The left ventricle has no regional wall motion abnormalities. There is no left ventricular hypertrophy. Right Ventricle: The right ventricular size is normal. No increase in right ventricular wall thickness. Global RV systolic function is has normal systolic function. Left Atrium: Left atrial size was normal in size. Right Atrium: Right atrial size was normal in size Pericardium: There is no evidence of pericardial effusion. Mitral Valve: The mitral valve is normal in structure. Trivial mitral valve regurgitation. Tricuspid Valve: The tricuspid valve is normal in structure. Tricuspid valve regurgitation is trivial. Aortic Valve: The aortic valve is normal in structure. Aortic valve regurgitation is  not visualized. Pulmonic Valve: The pulmonic valve was normal in structure. Pulmonic valve regurgitation is not visualized. Pulmonic regurgitation is not visualized. Aorta: The aortic root, ascending aorta and aortic arch are all structurally normal, with no evidence of dilitation or obstruction. IAS/Shunts: No atrial level shunt detected by color flow Doppler.  Serafina Royals MD Electronically signed by Serafina Royals MD Signature Date/Time: 04/13/2019/3:26:58 PM    Final         Scheduled Meds: . vitamin C  1,000 mg Oral Daily  . aspirin EC  81 mg Oral Daily  . [START ON 04/14/2019] atorvastatin  80 mg Oral Daily  . cholecalciferol  1,000 Units Oral Daily  . cilostazol  100 mg Oral BID  . famotidine  20 mg Oral BID  . folic acid  1 mg Oral Daily  . gabapentin  600 mg Oral BID  . insulin aspart  0-15 Units Subcutaneous TID WC  . insulin aspart  0-5 Units Subcutaneous QHS  . metoprolol succinate  25 mg Oral Daily  . polyethylene glycol  17 g Oral BID  . potassium chloride  40 mEq Oral Once  . Rivaroxaban  15 mg Oral BID  . [START ON 04/28/2019] rivaroxaban  20 mg Oral Q supper  . senna-docusate  1 tablet Oral BID  . sulfamethoxazole-trimethoprim  1 tablet Oral Q12H  . tamsulosin  0.4 mg Oral Daily  . zinc sulfate  220 mg Oral Daily   Continuous Infusions: . piperacillin-tazobactam (ZOSYN)  IV Stopped (04/13/19 1415)  . vancomycin       LOS: 0 days    Time spent: 15 minutes    Edwin Dada, MD [facility] 04/13/2019, 6:07 PM     Please page though [place] or Epic secure chat:  For password, contact charge nurse

## 2019-04-13 NOTE — Progress Notes (Signed)
*  PRELIMINARY RESULTS* Echocardiogram 2D Echocardiogram has been performed.  Brad Frost Brad Frost 04/13/2019, 3:00 PM

## 2019-04-14 DIAGNOSIS — J984 Other disorders of lung: Secondary | ICD-10-CM

## 2019-04-14 LAB — COMPREHENSIVE METABOLIC PANEL
ALT: 70 U/L — ABNORMAL HIGH (ref 0–44)
AST: 40 U/L (ref 15–41)
Albumin: 2.3 g/dL — ABNORMAL LOW (ref 3.5–5.0)
Alkaline Phosphatase: 127 U/L — ABNORMAL HIGH (ref 38–126)
Anion gap: 12 (ref 5–15)
BUN: 16 mg/dL (ref 8–23)
CO2: 24 mmol/L (ref 22–32)
Calcium: 9.2 mg/dL (ref 8.9–10.3)
Chloride: 106 mmol/L (ref 98–111)
Creatinine, Ser: 0.74 mg/dL (ref 0.61–1.24)
GFR calc Af Amer: >60 mL/min (ref >60)
GFR calc non Af Amer: >60 mL/min (ref >60)
Glucose, Bld: 156 mg/dL — ABNORMAL HIGH (ref 70–99)
Potassium: 3.3 mmol/L — ABNORMAL LOW (ref 3.5–5.1)
Sodium: 142 mmol/L (ref 135–145)
Total Bilirubin: 1.2 mg/dL (ref 0.3–1.2)
Total Protein: 7.5 g/dL (ref 6.5–8.1)

## 2019-04-14 LAB — CBC
HCT: 37.8 % — ABNORMAL LOW (ref 39.0–52.0)
Hemoglobin: 12.4 g/dL — ABNORMAL LOW (ref 13.0–17.0)
MCH: 29.2 pg (ref 26.0–34.0)
MCHC: 32.8 g/dL (ref 30.0–36.0)
MCV: 89.2 fL (ref 80.0–100.0)
Platelets: 368 10*3/uL (ref 150–400)
RBC: 4.24 MIL/uL (ref 4.22–5.81)
RDW: 16.7 % — ABNORMAL HIGH (ref 11.5–15.5)
WBC: 20.4 10*3/uL — ABNORMAL HIGH (ref 4.0–10.5)
nRBC: 0 % (ref 0.0–0.2)

## 2019-04-14 LAB — PROTIME-INR
INR: 1 (ref 0.8–1.2)
Prothrombin Time: 13.5 seconds (ref 11.4–15.2)

## 2019-04-14 MED ORDER — SULFAMETHOXAZOLE-TRIMETHOPRIM 800-160 MG PO TABS: 1.0000 | ORAL_TABLET | Freq: Two times a day (BID) | ORAL | 0 refills | Status: DC

## 2019-04-14 NOTE — Discharge Summary (Signed)
Physician Discharge Summary  RIELY BASKETT HEN:[id number] DOB: [birth year] DOA: 04/12/2019  PCP: [name], [place], MD  Admit date: 04/12/2019 Discharge date: 04/14/2019  Admitted From: [facility]  Disposition:  [facility]   Recommendations for Outpatient Follow-up:  1. [facility]: Please arrange for Pulmonology follow up WITHIN 4 weeks 2. Please repeat CT chest in 4 weeks 3. Recommend bronchoscopic evaluation of lung lesion 4. Dr. Joneen Caraway: Please follow up these pending lab tests: [place]: N/A  Equipment/Devices: TBD at SNF  Discharge Condition: Fair  CODE STATUS: FULL Diet recommendation: Diabetic, cardiac  Brief/Interim Summary: Mr. Brad Frost is a 63 y.o. M with DM, peripheral vascular disease, smoking who presented with tachycardia.  Recently admitted here for bilateral PE, COVID-19.  Hemodynamically stable with PE, started on anticoagulation with Xarelto.  Received 5 days remdesivir and dexamethasone and was discharged to continue dexamethasone.  Incidentally noted to have necrotic lung mass during that hospitalization, seen by Pulm, will need outpatient follow up.  Now sent back from [facility] due to tachycardia.  In the ER, afebrile, HR 122, RR 19-23, SpO2 91-97% on room air.  CTA chest showed no new emboli, but did show new larger cavitary lesion on chest CT.  Lactate normal, WBC 17K.        PRINCIPAL HOSPITAL DIAGNOSIS: Cavitating lung lesion       Discharge Diagnoses:   Cavitary lung lesion, malignancy vs possible Lung abscess vs infarct Patient sent from facility for tachycardia.  In ER noted to have leukocytosis, but no evidence of end organ damage, sepsis ruled out.  CT showed a new 5cm cavitating lung lesion.  Differential included infarct from PE, abscess (given poor dentition, possible aspiration) or malignancy.  Pulmonology were consulted and recommended lab work up, empiric treatment with Bactrim as an outpatient and  repeat imaging and bronchoscopy in 4 weeks.  Patient has Fungitell panel pending.  Was unable to produce sputum sample after attempt.  Needs Quantiferon Gold testing by Pulmonology.     Tachycardia Due to lung abscess, no evidence of sepsis syndrome.  Recent COVID infection Patient still within 21 day infection window, but has no active disease.  Just completed remdesivir during last hospitalization.  Recent pulmonary embolism Patient recently diagnosed with PE.  His clot burden was decreased on imaging.  He was continued on BID Xarelto, with once daily Xarelto to commence Jan 1.  Peripheral vascular disease Essential hypertension BP controlled  Diabetes  BPH  Hypokalemia  GERD           Discharge Instructions  Discharge Instructions    Discharge instructions   Complete by: As directed    You were sent to the ER for fast heart rate Here, we found that you have a new abscess in your lung.  This is likely an infection. Take Bactrim DS twice daily for 4 weeks Follow up with Pulmonology as soon as possible for bronchoscopy in 4 weeks  Repeat Chest imaging in 4 weeks     Allergies as of 04/14/2019   No Known Allergies     Medication List    STOP taking these medications   dexamethasone 4 MG tablet Commonly known as: DECADRON     TAKE these medications   acetaminophen 500 MG tablet Commonly known as: TYLENOL Take 500 mg by mouth every 6 (six) hours as needed.   ascorbic acid 1000 MG tablet Commonly known as: VITAMIN C Take 1 tablet (1,000 mg total) by mouth  2 (two) times daily.   aspirin 81 MG tablet Take 81 mg by mouth daily.   atorvastatin 80 MG tablet Commonly known as: LIPITOR Take 80 mg by mouth daily.   cilostazol 100 MG tablet Commonly known as: PLETAL Take 100 mg by mouth 2 (two) times daily.   folic acid 1 MG tablet Commonly known as: FOLVITE Take 1 mg by mouth daily.   gabapentin 300 MG capsule Commonly known as:  NEURONTIN Take 600 mg by mouth 2 (two) times daily.   metFORMIN 500 MG tablet Commonly known as: GLUCOPHAGE Take 500 mg by mouth daily.   metoprolol succinate 25 MG 24 hr tablet Commonly known as: TOPROL-XL Take 1 tablet (25 mg total) by mouth daily.   oxycodone 5 MG capsule Commonly known as: OXY-IR Take 5 mg by mouth every 4 (four) hours as needed for pain.   polyethylene glycol 17 g packet Commonly known as: MIRALAX / GLYCOLAX Take 17 g by mouth 2 (two) times daily.   Rivaroxaban 15 MG Tabs tablet Commonly known as: XARELTO Take 1 tablet (15 mg total) by mouth 2 (two) times daily for 18 days.   rivaroxaban 20 MG Tabs tablet Commonly known as: XARELTO Take 1 tablet (20 mg total) by mouth daily with supper. Starting from January 1 after finishing twice daily dose. Start taking on: April 26, 2019   senna-docusate 8.6-50 MG tablet Commonly known as: Senokot-S Take 1 tablet by mouth 2 (two) times daily.   sulfamethoxazole-trimethoprim 800-160 MG tablet Commonly known as: BACTRIM DS Take 1 tablet by mouth every 12 (twelve) hours.   tamsulosin 0.4 MG Caps capsule Commonly known as: FLOMAX Take 0.4 mg by mouth daily.   thiamine 100 MG tablet Commonly known as: VITAMIN B-1 Take 200 mg by mouth daily.   Vitamin D3 25 MCG tablet Commonly known as: Vitamin D Take 1 tablet (1,000 Units total) by mouth daily.   zinc sulfate 220 (50 Zn) MG capsule Take 220 mg by mouth 2 (two) times daily.      Follow-up Information    Pulmonologist Follow up.   Why: Call PCP for referral to pulmonology asap for bronchoscopy         No Known Allergies  Consultations:  Pulmonology   Procedures/Studies: CT Chest W Contrast  Result Date: 04/04/2019 CLINICAL DATA:  63 year old COVID-19 positive patient who had an abnormal chest x-ray earlier today, possibly indicating LEFT-sided pneumonia. EXAM: CT CHEST WITH CONTRAST TECHNIQUE: Multidetector CT imaging of the chest was  performed during intravenous contrast administration. CONTRAST:  60mL OMNIPAQUE IOHEXOL 300 MG/ML IV. COMPARISON:  No prior CT. Chest x-ray earlier same day and previously. FINDINGS: Respiratory motion blurred many of the images. Cardiovascular: Filling defects within the distal main pulmonary arteries bilaterally extending into the branches of the RIGHT UPPER LOBE, RIGHT MIDDLE LOBE, RIGHT LOWER LOBE and LEFT UPPER LOBE. Since the examination was not performed with angiographic technique, opacification of the arteries is not optimal. There is no evidence of RIGHT heart strain. Normal heart size. Moderate LAD and RIGHT coronary atherosclerosis. No pericardial effusion. Mild atherosclerosis involving the aortic arch without evidence of aneurysm. Mediastinum/Nodes: No pathologically enlarged mediastinal, hilar or axillary lymph nodes. No mediastinal masses. Normal-appearing esophagus. 5 mm nodule involving the UPPER pole the RIGHT lobe of the thyroid gland; remainder of the thyroid gland normal in appearance. Lungs/Pleura: Necrotic mass with irregular margins involving the anteroinferior LEFT UPPER LOBE, abutting the pleura, measuring approximately 3.2 x 2.6 x 2.9 cm (series 3/image  54 and series 5/image 51). No parenchymal nodules or masses elsewhere in either lung. Emphysematous changes diffusely throughout both lungs. Azygos fissure. Linear scar or atelectasis involving the LEFT LOWER LOBE and lingula. No pleural effusions. Confluent peripheral opacities deep in the POSTERIOR costophrenic sulcus of the LEFT LOWER LOBE. Central airways patent without significant bronchial wall thickening. Upper Abdomen: Phrygian cap involving the gallbladder which mimics a liver lesion. Visualized upper abdomen unremarkable. Musculoskeletal: Mild degenerative changes involving the thoracic spine. No acute findings. IMPRESSION: 1. Bilateral pulmonary emboli involving the distal main pulmonary arteries bilaterally extending into the  branches of the RIGHT UPPER LOBE, RIGHT MIDDLE LOBE, RIGHT LOWER LOBE and LEFT UPPER LOBE. 2. 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 3. Confluent airspace opacities deep in the POSTERIOR sulcus of the LEFT LOWER LOBE in the posterior costophrenic sulcus of the LEFT LOWER LOBE, atelectasis favored over pneumonia. 4. 5 mm nodule involving the upper pole the RIGHT lobe of the thyroid gland, statistically a benign adenoma. No followup recommended (ref: J Am Coll Radiol. 2015 Feb;12(2): 143-50). Aortic Atherosclerosis (ICD10-I70.0) and Emphysema (ICD10-J43.9). I telephoned these results at the time of interpretation on 04/04/2019 at 4:51 pm to provider Dorothea Glassman, MD of the emergency department, who verbally acknowledged these results. Electronically Signed   By: Hulan Saas M.D.   On: 04/04/2019 16:51   CT Angio Chest PE W and/or Wo Contrast  Result Date: 04/13/2019 CLINICAL DATA:  Shortness of breath. Recent pulmonary embolus. COVID-19 infection. EXAM: CT ANGIOGRAPHY CHEST WITH CONTRAST TECHNIQUE: Multidetector CT imaging of the chest was performed using the standard protocol during bolus administration of intravenous contrast. Multiplanar CT image reconstructions and MIPs were obtained to evaluate the vascular anatomy. CONTRAST:  75mL OMNIPAQUE IOHEXOL 350 MG/ML SOLN COMPARISON:  On the chest x-ray 04/12/2019. CT chest 04/04/2019. FINDINGS: Cardiovascular: The heart size is normal. Emboli the bifurcations of the right and left main pulmonary emboli are again seen. Clot burden has reduced. No new emboli are present. Aortic arch and great vessels demonstrate minimal calcification. Mediastinum/Nodes: No enlarged mediastinal, hilar, or axillary lymph nodes. Thyroid gland, trachea, and esophagus demonstrate no significant findings. Lungs/Pleura: Previously noted cavitary lesion in the lingula is again  noted. There is a new, much larger cavitary lesion in the medial aspect of the left upper lobe measuring 5.4 x 5.6 x 4.9 cm. Mild left lower lobe airspace opacities have improved. Minimal atelectasis is present at the right base. Azygos fissure is again noted. There is no pneumothorax. Minimal fluid is present in the left base. Upper Abdomen: Visualized upper abdomen is unremarkable. Musculoskeletal: Vertebral body heights alignment are maintained. Ribs are unremarkable. No focal lytic or blastic lesions are present. Review of the MIP images confirms the above findings. IMPRESSION: 1. Decreased clot burden. 2. No new emboli. 3. New, much larger cavitary lesion in the medial aspect of the left upper lobe measuring 5.4 x 5.6 x 4.9 cm. There was concern for neoplasm on the previous chest CT. While this is still in the differential diagnosis, infection or infarct is also considered. 4. Improving dependent left lower lobe airspace disease. 5. Aortic Atherosclerosis (ICD10-I70.0). Electronically Signed   By: Marin Roberts M.D.   On: 04/13/2019 07:07   DG Chest Port 1 View  Result Date: 04/12/2019 CLINICAL DATA:  Dyspnea EXAM: PORTABLE CHEST 1 VIEW COMPARISON:  April 04, 2019 FINDINGS: Again noted is a mass in the left upper lobe  with surrounding adjacent atelectasis or consolidation. This is more conspicuous on today's exam. There is space opacities at the left lung base. There is a small left-sided pleural effusion. There is an azygos lobe. There is no pneumothorax. There is no acute osseous abnormality. The heart size is normal. IMPRESSION: 1. More confluent airspace disease in the left upper lobe in the region of the patient's previously demonstrated cavitary lesion. This may represent worsening consolidation or atelectasis. 2. Persistent consolidation in the left lower lobe with a small left-sided pleural effusion. Electronically Signed   By: Katherine Mantle M.D.   On: 04/12/2019 23:53   DG Chest  Portable 1 View  Result Date: 04/04/2019 CLINICAL DATA:  Hypotension, COVID-19 positive EXAM: PORTABLE CHEST 1 VIEW COMPARISON:  None. FINDINGS: The heart size and mediastinal contours are within normal limits. Focal airspace opacities within the mid to lower aspects of the left long. No pleural effusion or pneumothorax. The visualized skeletal structures are unremarkable. IMPRESSION: Left mid to lower lung zone airspace opacities suspicious for pneumonia. Electronically Signed   By: Duanne Guess M.D.   On: 04/04/2019 12:48   ECHOCARDIOGRAM COMPLETE  Result Date: 04/13/2019   ECHOCARDIOGRAM REPORT   Patient Name:   Brad Frost [facility] Date of Exam: 04/13/2019 Medical Rec #:  [id number]         Height: Accession #:    [id number]        Weight: Date of Birth:  02/15/1956          BSA: Patient Age:    63 years          BP:           114/83 mmHg Patient Gender: M                 HR:           114 bpm. Exam Location:  [facility] Procedure: 2D Echo, Color Doppler and Cardiac Doppler Indications:     Pulmonary embolus 415.19  History:         Patient has no prior history of Echocardiogram examinations.                  Risk Factors:Diabetes. PVD.  Sonographer:     Cristela Blue RDCS (AE) Referring Phys:  [id number] Earl Lites Nayelli Inglis Diagnosing Phys: Arnoldo Hooker MD  Sonographer Comments: Technically difficult study due to poor echo windows, no apical window and no subcostal window. Image acquisition challenging due to patient body habitus. IMPRESSIONS  1. Left ventricular ejection fraction, by visual estimation, is 60 to 65%. The left ventricle has normal function. There is no left ventricular hypertrophy.  2. The left ventricle has no regional wall motion abnormalities.  3. Global right ventricle has normal systolic function.The right ventricular size is normal. No increase in right ventricular wall thickness.  4. Left atrial size was normal.  5. Right atrial size was normal.  6. The mitral valve is normal in structure.  Trivial mitral valve regurgitation.  7. The tricuspid valve is normal in structure. Tricuspid valve regurgitation is trivial.  8. The aortic valve is normal in structure. Aortic valve regurgitation is not visualized.  9. The pulmonic valve was normal in structure. Pulmonic valve regurgitation is not visualized. FINDINGS  Left Ventricle: Left ventricular ejection fraction, by visual estimation, is 60 to 65%. The left ventricle has normal function. The left ventricle has no regional wall motion abnormalities. There is no left ventricular hypertrophy. Right Ventricle: The right ventricular size is  normal. No increase in right ventricular wall thickness. Global RV systolic function is has normal systolic function. Left Atrium: Left atrial size was normal in size. Right Atrium: Right atrial size was normal in size Pericardium: There is no evidence of pericardial effusion. Mitral Valve: The mitral valve is normal in structure. Trivial mitral valve regurgitation. Tricuspid Valve: The tricuspid valve is normal in structure. Tricuspid valve regurgitation is trivial. Aortic Valve: The aortic valve is normal in structure. Aortic valve regurgitation is not visualized. Pulmonic Valve: The pulmonic valve was normal in structure. Pulmonic valve regurgitation is not visualized. Pulmonic regurgitation is not visualized. Aorta: The aortic root, ascending aorta and aortic arch are all structurally normal, with no evidence of dilitation or obstruction. IAS/Shunts: No atrial level shunt detected by color flow Doppler.  Arnoldo HookerBruce Kowalski MD Electronically signed by Arnoldo HookerBruce Kowalski MD Signature Date/Time: 04/13/2019/3:26:58 PM    Final       Subjective: No complaints.  No chest pain, vomiting.  No sputum, no hemoptysis.  No dyspnea, no confusion.  He is weak, confused.  Discharge Exam: Vitals:   04/14/19 1300 04/14/19 1321  BP: 118/82   Pulse:  (!) 106  Resp:    Temp:    SpO2:  100%   Vitals:   04/14/19 0730 04/14/19 1257  04/14/19 1300 04/14/19 1321  BP: (!) 132/93  118/82   Pulse: (!) 106   (!) 106  Resp: 20     Temp:  (!) 97.5 F (36.4 C)    TempSrc:  Oral    SpO2: 98%   100%  Weight:      Height:        General: Pt is alert, awake, not in acute distress, wants to watch TV Cardiovascular: Tachycardic, regular, nl S1-S2, no murmurs appreciated.   No LE edema.   Respiratory: Normal respiratory rate and rhythm.  CTAB without rales or wheezes.Diminished. Abdominal: Abdomen soft and non-tender.  No distension or HSM.   Neuro/Psych: Strength symmetric in upper and lower extremities.  Judgment and insight appear impaired, per brother, he has had this new level of cognitive impairment for the last 2-3 months.   The results of significant diagnostics from this hospitalization (including imaging, microbiology, ancillary and laboratory) are listed below for reference.     Microbiology: Recent Results (from the past 240 hour(s))  MRSA PCR Screening     Status: None   Collection Time: 04/05/19  9:18 PM   Specimen: Nasal Mucosa; Nasopharyngeal  Result Value Ref Range Status   MRSA by PCR NEGATIVE NEGATIVE Final    Comment:        The GeneXpert MRSA Assay (FDA approved for NASAL specimens only), is one component of a comprehensive MRSA colonization surveillance program. It is not intended to diagnose MRSA infection nor to guide or monitor treatment for MRSA infections. Performed at [facility], [address] [place], [place] [address]   Blood Culture (routine x 2)     Status: None (Preliminary result)   Collection Time: 04/13/19  1:37 AM   Specimen: BLOOD  Result Value Ref Range Status   Specimen Description BLOOD RIGHT ASSIST CONTROL  Final   Special Requests   Final    BOTTLES DRAWN AEROBIC AND ANAEROBIC Blood Culture results may not be optimal due to an inadequate volume of blood received in culture bottles   Culture   Final    NO GROWTH 1 DAY Performed at [facility],  [address] [place], [place] [address]  Report Status PENDING  Incomplete  Blood Culture (routine x 2)     Status: None (Preliminary result)   Collection Time: 04/13/19  3:37 AM   Specimen: BLOOD  Result Value Ref Range Status   Specimen Description BLOOD RIGHT ARM  Final   Special Requests   Final    BOTTLES DRAWN AEROBIC AND ANAEROBIC Blood Culture adequate volume   Culture   Final    NO GROWTH 1 DAY Performed at [facility], [address] [place], [place] [address]    Report Status PENDING  Incomplete     Labs: BNP (last 3 results) No results for input(s): BNP in the last 8760 hours. Basic Metabolic Panel: Recent Labs  Lab 04/08/19 0526 04/09/19 0557 04/13/19 0341  NA 141 140 143  K 4.0 3.5 3.2*  CL 114* 112* 109  CO2 19* 20* 21*  GLUCOSE 131* 101* 117*  BUN 18 16 15   CREATININE 0.58* 0.51* 0.73  CALCIUM 8.6* 8.3* 8.6*  MG  --   --  2.1   Liver Function Tests: Recent Labs  Lab 04/09/19 0557 04/13/19 0341  AST 60* 31  ALT 69* 87*  ALKPHOS 109 132*  BILITOT 0.7 1.6*  PROT 6.0* 7.3  ALBUMIN 2.1* 2.1*   No results for input(s): LIPASE, AMYLASE in the last 168 hours. No results for input(s): AMMONIA in the last 168 hours. CBC: Recent Labs  Lab 04/08/19 0526 04/09/19 0557 04/13/19 0341  WBC 16.6* 15.5* 17.4*  NEUTROABS  --   --  13.3*  HGB 11.4* 10.4* 12.4*  HCT 34.2* 32.0* 37.2*  MCV 88.6 91.7 86.5  PLT 218 206 344   Cardiac Enzymes: No results for input(s): CKTOTAL, CKMB, CKMBINDEX, TROPONINI in the last 168 hours. BNP: Invalid input(s): POCBNP CBG: Recent Labs  Lab 04/08/19 2049 04/09/19 0859 04/09/19 1212 04/13/19 1157 04/13/19 1745  GLUCAP 106* 96 96 176* 140*   D-Dimer No results for input(s): DDIMER in the last 72 hours. Hgb A1c No results for input(s): HGBA1C in the last 72 hours. Lipid Profile Recent Labs    04/13/19 0341  TRIG 110   Thyroid function studies No results for input(s): TSH, T4TOTAL, T3FREE,  THYROIDAB in the last 72 hours.  Invalid input(s): FREET3 Anemia work up 04/15/19    04/13/19 0341  FERRITIN 588*   Urinalysis No results found for: COLORURINE, APPEARANCEUR, LABSPEC, PHURINE, GLUCOSEU, HGBUR, BILIRUBINUR, KETONESUR, PROTEINUR, UROBILINOGEN, NITRITE, LEUKOCYTESUR Sepsis Labs Invalid input(s): PROCALCITONIN,  WBC,  LACTICIDVEN Microbiology Recent Results (from the past 240 hour(s))  MRSA PCR Screening     Status: None   Collection Time: 04/05/19  9:18 PM   Specimen: Nasal Mucosa; Nasopharyngeal  Result Value Ref Range Status   MRSA by PCR NEGATIVE NEGATIVE Final    Comment:        The GeneXpert MRSA Assay (FDA approved for NASAL specimens only), is one component of a comprehensive MRSA colonization surveillance program. It is not intended to diagnose MRSA infection nor to guide or monitor treatment for MRSA infections. Performed at [facility], [address] [place], [place]   Blood Culture (routine x 2)     Status: None (Preliminary result)   Collection Time: 04/13/19  1:37 AM   Specimen: BLOOD  Result Value Ref Range Status   Specimen Description BLOOD RIGHT ASSIST CONTROL  Final   Special Requests   Final    BOTTLES DRAWN AEROBIC AND ANAEROBIC Blood Culture results may not be optimal due  to an inadequate volume of blood received in culture bottles   Culture   Final    NO GROWTH 1 DAY Performed at [facility], [address] [place], [place] [address]    Report Status PENDING  Incomplete  Blood Culture (routine x 2)     Status: None (Preliminary result)   Collection Time: 04/13/19  3:37 AM   Specimen: BLOOD  Result Value Ref Range Status   Specimen Description BLOOD RIGHT ARM  Final   Special Requests   Final    BOTTLES DRAWN AEROBIC AND ANAEROBIC Blood Culture adequate volume   Culture   Final    NO GROWTH 1 DAY Performed at [facility], [address], [place], [place] [address]    Report  Status PENDING  Incomplete     Time coordinating discharge: 35 minutes The [place] controlled substances registry was reviewed for this patient.      SIGNED:   Alberteen Sam, MD  [facility] 04/14/2019, 1:55 PM

## 2019-04-14 NOTE — ED Notes (Signed)
Patient observed resting comfortably. Dry cough noted. Denies pain. Will continue to monitor.

## 2019-04-14 NOTE — Social Work (Signed)
TOC CM/SW coordinated w/[facility], [phone], regarding patient's discharge/return to their facility.   Waiting for call from admissions coordinator.   Berenice Bouton, MSW, LCSW  [phone] 8am-6pm (weekends) or CSW ED # [phone]

## 2019-04-14 NOTE — TOC Initial Note (Signed)
Transition of Care [facility]) - Initial/Assessment Note    Patient Details  Name: Brad Frost MRN: [id number] Date of Birth: [birth year]  Transition of Care [facility]) CM/SW Contact:    Berenice Bouton, LCSW Phone Number: 04/14/2019, 2:51 PM  Clinical Narrative:        Patient will discharge to [facility] Call report: [phone]  [place]  - Room 810           Expected Discharge Plan: Skilled Nursing Facility(Patient from [facility]) Barriers to Discharge: No Barriers Identified   Patient Goals and CMS Choice Patient states their goals for this hospitalization and ongoing recovery are:: Return to [facility] CMS Medicare.gov Compare Post Acute Care list provided to:: Patient Choice offered to / list presented to : Patient  Expected Discharge Plan and Services Expected Discharge Plan: Skilled Nursing Facility(Patient from [facility])       Living arrangements for the past 2 months: [place] Expected Discharge Date: 04/14/19                                    Prior Living Arrangements/Services Living arrangements for the past 2 months: [place] Lives with:: Self Patient language and need for interpreter reviewed:: No Do you feel safe going back to the place where you live?: Yes      Need for Family Participation in Patient Care: No (Comment) Care giver support system in place?: Yes (comment)   Criminal Activity/Legal Involvement Pertinent to Current Situation/Hospitalization: No - Comment as needed  Activities of Daily Living      Permission Sought/Granted Permission sought to share information with : Facility Sport and exercise psychologist, Case Engineer, structural Information with NAME: Family contacts  Permission granted to share info w AGENCY: [facility]  Permission granted to share info w Relationship: Family contacts     Emotional Assessment     Affect (typically observed): Unable to  Assess Orientation: : Oriented to Self, Oriented to Place, Oriented to  Time, Oriented to Situation Alcohol / Substance Use: Not Applicable Psych Involvement: No (comment)  Admission diagnosis:  COVID-19 [U07.1] Patient Active Problem List   Diagnosis Date Noted  . COVID-19 04/13/2019  . Hypotension   . COVID-19 virus infection 04/04/2019  . Pneumonia due to COVID-19 virus   . Bilateral pulmonary embolism ([place])   . Lung mass   . AKI (acute kidney injury) ([place])   . Type 2 diabetes mellitus with diabetic neuropathy, without long-term current use of insulin ([place])   . PVD (peripheral vascular disease) ([place]) 02/14/2015   PCP:  [name], [place], MD Pharmacy:  No Pharmacies Listed    Social Determinants of Health (SDOH) Interventions    Readmission Risk Interventions No flowsheet data found.

## 2019-04-14 NOTE — Progress Notes (Signed)
Physical Therapy Evaluation Patient Details Name: EL PILE MRN: [id number] DOB: [birth year] Today's Date: 04/14/2019   History of Present Illness  Brad Frost  is a 63 y.o. African-American male with a known history of recently diagnosed COVID-31 on 12/9 with associated bilateral pulmonary emboli, on Xarelto with history of type 2 diabetes mellitus and peripheral vascular disease, who presented to the emergency room from [facility] with acute onset of tachycardia noticed there with heart rate of 122 and O2 saturation that was 93% on room air and has occasionally dropped to 90% in the ER.  Clinical Impression  Patient presents in ER lying on stretcher. He is mildly uncooperative as he is mildly confused and does not want to answer questions. He has -3/5 strength BLE hip flex and abd, 3/5 strength B knee extension and 3/5 ankle DF bilaterally. He is able to perform supine to sit with min assist. He is able to sit on edge of stretcher with feet dangling and BUE support. He reports that he doesn't want to stand up unless his wc is nearby as he doesn't want to fall. His wc is not in the room and therefore he wont try to stand or ambulate. He was able to raise his legs to get back to the stretcher without assist. He will benefit from skilled PT to improve mobility and strength.     Follow Up Recommendations SNF    Equipment Recommendations  None recommended by PT    Recommendations for Other Services       Precautions / Restrictions Precautions Precautions: Fall Restrictions Weight Bearing Restrictions: No      Mobility  Bed Mobility Overal bed mobility: Needs Assistance Bed Mobility: Supine to Sit;Sit to Supine     Supine to sit: Min assist Sit to supine: Min assist   General bed mobility comments: Min A for BLE control during sup to/from sit  Transfers Overall transfer level: (Patient refused to stand up due to not having a wc for him)                   Ambulation/Gait Ambulation/Gait assistance: (Patient refused)              Stairs            Wheelchair Mobility    Modified Rankin (Stroke Patients Only)       Balance Overall balance assessment: Needs assistance Sitting-balance support: Bilateral upper extremity supported                                         Pertinent Vitals/Pain Pain Assessment: No/denies pain    Home Living Family/patient expects to be discharged to:: Skilled nursing facility Living Arrangements: Alone                    Prior Function Level of Independence: Independent with assistive device(s)         Comments: Patient was agitated about answering questions     Hand Dominance   Dominant Hand: Right    Extremity/Trunk Assessment   Upper Extremity Assessment Upper Extremity Assessment: Generalized weakness    Lower Extremity Assessment Lower Extremity Assessment: Generalized weakness       Communication   Communication: No difficulties  Cognition Arousal/Alertness: Awake/alert Behavior During Therapy: WFL for tasks assessed/performed Overall Cognitive Status: No family/caregiver present to determine baseline cognitive functioning  General Comments: Patient did not answer questions but answered them with a question      General Comments      Exercises     Assessment/Plan    PT Assessment Patient needs continued PT services  PT Problem List Decreased strength;Decreased activity tolerance;Decreased mobility       PT Treatment Interventions Gait training;Therapeutic activities;Therapeutic exercise    PT Goals (Current goals can be found in the Care Plan section)  Acute Rehab PT Goals Patient Stated Goal: [facility] Goal Formulation: Patient unable to participate in goal setting Time For Goal Achievement: 04/28/19 Potential to Achieve Goals: Fair    Frequency Min 2X/week    Barriers to discharge        Co-evaluation               AM-PAC PT "6 Clicks" Mobility  Outcome Measure Help needed turning from your back to your side while in a flat bed without using bedrails?: A Lot Help needed moving from lying on your back to sitting on the side of a flat bed without using bedrails?: A Lot Help needed moving to and from a bed to a chair (including a wheelchair)?: A Lot Help needed standing up from a chair using your arms (e.g., wheelchair or bedside chair)?: A Lot Help needed to walk in hospital room?: A Lot Help needed climbing 3-5 steps with a railing? : A Lot 6 Click Score: 12    End of Session Equipment Utilized During Treatment: Gait belt Activity Tolerance: Patient tolerated treatment well Patient left: Other (comment) Nurse Communication: (on stretcher) PT Visit Diagnosis: Muscle weakness (generalized) (M62.81);Difficulty in walking, not elsewhere classified (R26.2)    Time: 1500-1530 PT Time Calculation (min) (ACUTE ONLY): 30 min   Charges:   PT Evaluation $PT Eval Low Complexity: 1 Low PT Treatments $Therapeutic Activity: 8-22 mins          Ezekiel Ina, PT DPT 04/14/2019, 4:14 PM

## 2019-04-14 NOTE — ED Notes (Signed)
Patient is med selective, and refuses most meds. He is encouraged to comply but only agrees to take "antibiotic and vitamins".

## 2019-04-14 NOTE — NC FL2 (Signed)
[place] LEVEL OF CARE SCREENING TOOL     IDENTIFICATION  Patient Name: Brad Frost Birthdate: 01/01/1956 Sex: male Admission Date (Current Location): 04/12/2019  [place] and [place] Number:  Engineering geologist and Address:  [facility], [address] [place], [place] [address]      Provider Number: [id number]  Attending Physician Name and Address:  Edwin Dada, *  Relative Name and Phone Number:  Tamaj, Jurgens [phone];  moore,patsy Sister [phone]    Current Level of Care: Hospital Recommended Level of Care: [place] Prior Approval Number:    Date Approved/Denied:   PASRR Number: [id number]  Discharge Plan: SNF    Current Diagnoses: Patient Active Problem List   Diagnosis Date Noted  . COVID-19 04/13/2019  . Hypotension   . COVID-19 virus infection 04/04/2019  . Pneumonia due to COVID-19 virus   . Bilateral pulmonary embolism ([place])   . Lung mass   . AKI (acute kidney injury) ([place])   . Type 2 diabetes mellitus with diabetic neuropathy, without long-term current use of insulin ([place])   . PVD (peripheral vascular disease) ([place]) 02/14/2015    Orientation RESPIRATION BLADDER Height & Weight     Time, Situation, Self, Place    Incontinent Weight: 185 lb (83.9 kg) Height:  5' 10.5" (179.1 cm)  BEHAVIORAL SYMPTOMS/MOOD NEUROLOGICAL BOWEL NUTRITION STATUS      Incontinent Diet  AMBULATORY STATUS COMMUNICATION OF NEEDS Skin   Limited Assist Verbally Normal                       Personal Care Assistance Level of Assistance  Bathing, Feeding, Dressing Bathing Assistance: Limited assistance Feeding assistance: Limited assistance Dressing Assistance: Limited assistance     Functional Limitations Info    Sight Info: Adequate Hearing Info: Adequate Speech Info: Adequate    SPECIAL CARE FACTORS FREQUENCY  PT (By licensed PT), OT (By licensed OT)     PT Frequency:  5x OT Frequency: 5x            Contractures      Additional Factors Info  Code Status, Allergies Code Status Info: DNR Allergies Info: No known Allergies     Isolation Precautions Info: Covid positive     Current Medications (04/14/2019):  This is the current hospital active medication list Current Facility-Administered Medications  Medication Dose Route Frequency Provider Last Rate Last Admin  . acetaminophen (TYLENOL) tablet 500 mg  500 mg Oral Q6H PRN Edwin Dada, MD      . ascorbic acid (VITAMIN C) tablet 1,000 mg  1,000 mg Oral Daily Mansy, Jan A, MD   1,000 mg at 04/14/19 1220  . aspirin EC tablet 81 mg  81 mg Oral Daily Danford, Suann Larry, MD      . atorvastatin (LIPITOR) tablet 80 mg  80 mg Oral Daily Danford, Suann Larry, MD      . cholecalciferol (VITAMIN D) tablet 1,000 Units  1,000 Units Oral Daily Mansy, Jan A, MD      . cilostazol (PLETAL) tablet 100 mg  100 mg Oral BID Danford, Suann Larry, MD      . famotidine (PEPCID) tablet 20 mg  20 mg Oral BID Mansy, Jan A, MD      . folic acid (FOLVITE) tablet 1 mg  1 mg Oral Daily Danford, Christopher P, MD      . gabapentin (NEURONTIN) capsule 600 mg  600 mg Oral BID Danford,  Earl Lites, MD      . insulin aspart (novoLOG) injection 0-15 Units  0-15 Units Subcutaneous TID WC Alberteen Sam, MD   2 Units at 04/13/19 1804  . insulin aspart (novoLOG) injection 0-5 Units  0-5 Units Subcutaneous QHS Danford, Earl Lites, MD      . metoprolol succinate (TOPROL-XL) 24 hr tablet 25 mg  25 mg Oral Daily Danford, Earl Lites, MD      . oxyCODONE (Oxy IR/ROXICODONE) immediate release tablet 5 mg  5 mg Oral Q4H PRN Danford, Earl Lites, MD      . polyethylene glycol (MIRALAX / GLYCOLAX) packet 17 g  17 g Oral BID Danford, Christopher P, MD      . potassium chloride (KLOR-CON) packet 40 mEq  40 mEq Oral Once Danford, Earl Lites, MD      . Rivaroxaban (XARELTO) tablet 15 mg  15 mg Oral BID Alberteen Sam, MD      . Melene Muller ON 04/28/2019] rivaroxaban (XARELTO) tablet 20 mg  20 mg Oral Q supper Danford, Earl Lites, MD      . senna-docusate (Senokot-S) tablet 1 tablet  1 tablet Oral BID Danford, Earl Lites, MD      . sulfamethoxazole-trimethoprim (BACTRIM DS) 800-160 MG per tablet 1 tablet  1 tablet Oral Q12H Vida Rigger, MD   1 tablet at 04/14/19 1219  . tamsulosin (FLOMAX) capsule 0.4 mg  0.4 mg Oral Daily Danford, Earl Lites, MD      . zinc sulfate capsule 220 mg  220 mg Oral Daily Mansy, Jan A, MD   220 mg at 04/14/19 1220   Current Outpatient Medications  Medication Sig Dispense Refill  . aspirin 81 MG tablet Take 81 mg by mouth daily.    . cholecalciferol (VITAMIN D) 25 MCG tablet Take 1 tablet (1,000 Units total) by mouth daily.    . cilostazol (PLETAL) 100 MG tablet Take 100 mg by mouth 2 (two) times daily.    Marland Kitchen dexamethasone (DECADRON) 4 MG tablet Take 1 tablet (4 mg total) by mouth daily for 5 days. 5 tablet 0  . folic acid (FOLVITE) 1 MG tablet Take 1 mg by mouth daily.    Marland Kitchen gabapentin (NEURONTIN) 300 MG capsule Take 600 mg by mouth 2 (two) times daily.     . metFORMIN (GLUCOPHAGE) 500 MG tablet Take 500 mg by mouth daily.    . metoprolol succinate (TOPROL-XL) 25 MG 24 hr tablet Take 1 tablet (25 mg total) by mouth daily.    Marland Kitchen oxycodone (OXY-IR) 5 MG capsule Take 5 mg by mouth every 4 (four) hours as needed for pain.    . polyethylene glycol (MIRALAX / GLYCOLAX) 17 g packet Take 17 g by mouth 2 (two) times daily.    . Rivaroxaban (XARELTO) 15 MG TABS tablet Take 1 tablet (15 mg total) by mouth 2 (two) times daily for 18 days. 36 tablet 0  . senna-docusate (SENOKOT-S) 8.6-50 MG tablet Take 1 tablet by mouth 2 (two) times daily.    Marland Kitchen thiamine (VITAMIN B-1) 100 MG tablet Take 200 mg by mouth daily.    . vitamin C (VITAMIN C) 1000 MG tablet Take 1 tablet (1,000 mg total) by mouth 2 (two) times daily.    Marland Kitchen zinc sulfate 220 (50 Zn) MG capsule Take 220 mg by mouth 2  (two) times daily.    Marland Kitchen acetaminophen (TYLENOL) 500 MG tablet Take 500 mg by mouth every 6 (six) hours as needed.    Marland Kitchen  atorvastatin (LIPITOR) 80 MG tablet Take 80 mg by mouth daily.    Melene Muller. [START ON 04/26/2019] rivaroxaban (XARELTO) 20 MG TABS tablet Take 1 tablet (20 mg total) by mouth daily with supper. Starting from January 1 after finishing twice daily dose. 30 tablet   . tamsulosin (FLOMAX) 0.4 MG CAPS capsule Take 0.4 mg by mouth daily.       Discharge Medications: Please see discharge summary for a list of discharge medications.  Relevant Imaging Results:  Relevant Lab Results:   Additional Information SS# [id number]  Larwance RoteMaritza I Ogechi Kuehnel, LCSW

## 2019-04-14 NOTE — ED Notes (Signed)
Pt refusing blood draw per lab. This RN talked to pt with hospitalist to make agreeable. Lab at bedside now to reattempt to draw blood.

## 2019-04-15 LAB — HEMOGLOBIN A1C
Hgb A1c MFr Bld: 6.5 % — ABNORMAL HIGH (ref 4.8–5.6)
Mean Plasma Glucose: 139.85 mg/dL

## 2019-04-17 LAB — ASPERGILLUS ANTIGEN, BAL/SERUM: Aspergillus Ag, BAL/Serum: 0.04 Index (ref 0.00–0.49)

## 2019-04-18 LAB — CULTURE, BLOOD (ROUTINE X 2)
Culture: NO GROWTH
Culture: NO GROWTH
Special Requests: ADEQUATE

## 2019-04-19 ENCOUNTER — Other Ambulatory Visit: Payer: Self-pay | Admitting: Internal Medicine

## 2019-04-19 LAB — FUNGITELL, SERUM: Fungitell Result: <31 pg/mL (ref <80)

## 2019-04-19 LAB — CULTURE, BLOOD (SINGLE)
Culture: NO GROWTH
Special Requests: ADEQUATE

## 2020-09-16 ENCOUNTER — Other Ambulatory Visit: Payer: Self-pay

## 2020-09-16 ENCOUNTER — Inpatient Hospital Stay
Admit: 2020-09-16 | Discharge: 2020-09-16 | Disposition: A | Discharge status: Home / Self Care | Payer: No Typology Code available for payment source | Attending: Internal Medicine | Admitting: Internal Medicine

## 2020-09-16 ENCOUNTER — Inpatient Hospital Stay
Admission: EM | Admit: 2020-09-16 | Discharge: 2020-09-23 | DRG: 300 | Disposition: A | Discharge status: Skilled Nursing Facility | Payer: No Typology Code available for payment source | Attending: Internal Medicine | Admitting: Internal Medicine

## 2020-09-16 ENCOUNTER — Emergency Department: Payer: No Typology Code available for payment source

## 2020-09-16 DIAGNOSIS — L97511 Non-pressure chronic ulcer of other part of right foot limited to breakdown of skin: Secondary | ICD-10-CM | POA: Diagnosis not present

## 2020-09-16 DIAGNOSIS — E1140 Type 2 diabetes mellitus with diabetic neuropathy, unspecified: Secondary | ICD-10-CM | POA: Diagnosis not present

## 2020-09-16 DIAGNOSIS — L089 Local infection of the skin and subcutaneous tissue, unspecified: Secondary | ICD-10-CM

## 2020-09-16 DIAGNOSIS — A419 Sepsis, unspecified organism: Secondary | ICD-10-CM

## 2020-09-16 DIAGNOSIS — Z7984 Long term (current) use of oral hypoglycemic drugs: Secondary | ICD-10-CM

## 2020-09-16 DIAGNOSIS — L97519 Non-pressure chronic ulcer of other part of right foot with unspecified severity: Secondary | ICD-10-CM | POA: Diagnosis present

## 2020-09-16 DIAGNOSIS — B870 Cutaneous myiasis: Secondary | ICD-10-CM | POA: Diagnosis present

## 2020-09-16 DIAGNOSIS — E872 Acidosis: Secondary | ICD-10-CM | POA: Diagnosis present

## 2020-09-16 DIAGNOSIS — L97321 Non-pressure chronic ulcer of left ankle limited to breakdown of skin: Secondary | ICD-10-CM | POA: Diagnosis present

## 2020-09-16 DIAGNOSIS — D649 Anemia, unspecified: Secondary | ICD-10-CM | POA: Diagnosis present

## 2020-09-16 DIAGNOSIS — L97514 Non-pressure chronic ulcer of other part of right foot with necrosis of bone: Secondary | ICD-10-CM | POA: Diagnosis present

## 2020-09-16 DIAGNOSIS — I890 Lymphedema, not elsewhere classified: Secondary | ICD-10-CM | POA: Diagnosis present

## 2020-09-16 DIAGNOSIS — F10230 Alcohol dependence with withdrawal, uncomplicated: Secondary | ICD-10-CM | POA: Diagnosis present

## 2020-09-16 DIAGNOSIS — I70261 Atherosclerosis of native arteries of extremities with gangrene, right leg: Secondary | ICD-10-CM | POA: Diagnosis present

## 2020-09-16 DIAGNOSIS — Z8249 Family history of ischemic heart disease and other diseases of the circulatory system: Secondary | ICD-10-CM

## 2020-09-16 DIAGNOSIS — I739 Peripheral vascular disease, unspecified: Secondary | ICD-10-CM

## 2020-09-16 DIAGNOSIS — Z20822 Contact with and (suspected) exposure to covid-19: Secondary | ICD-10-CM | POA: Diagnosis present

## 2020-09-16 DIAGNOSIS — I10 Essential (primary) hypertension: Secondary | ICD-10-CM | POA: Diagnosis present

## 2020-09-16 DIAGNOSIS — L03115 Cellulitis of right lower limb: Secondary | ICD-10-CM | POA: Diagnosis present

## 2020-09-16 DIAGNOSIS — L03119 Cellulitis of unspecified part of limb: Secondary | ICD-10-CM | POA: Diagnosis not present

## 2020-09-16 DIAGNOSIS — I70292 Other atherosclerosis of native arteries of extremities, left leg: Secondary | ICD-10-CM | POA: Diagnosis present

## 2020-09-16 DIAGNOSIS — E871 Hypo-osmolality and hyponatremia: Secondary | ICD-10-CM | POA: Diagnosis present

## 2020-09-16 DIAGNOSIS — E1152 Type 2 diabetes mellitus with diabetic peripheral angiopathy with gangrene: Secondary | ICD-10-CM | POA: Diagnosis present

## 2020-09-16 DIAGNOSIS — Z87891 Personal history of nicotine dependence: Secondary | ICD-10-CM | POA: Diagnosis not present

## 2020-09-16 DIAGNOSIS — Z7901 Long term (current) use of anticoagulants: Secondary | ICD-10-CM

## 2020-09-16 DIAGNOSIS — E11621 Type 2 diabetes mellitus with foot ulcer: Secondary | ICD-10-CM | POA: Diagnosis present

## 2020-09-16 DIAGNOSIS — L97311 Non-pressure chronic ulcer of right ankle limited to breakdown of skin: Secondary | ICD-10-CM | POA: Diagnosis present

## 2020-09-16 DIAGNOSIS — M86671 Other chronic osteomyelitis, right ankle and foot: Secondary | ICD-10-CM | POA: Diagnosis present

## 2020-09-16 DIAGNOSIS — L280 Lichen simplex chronicus: Secondary | ICD-10-CM | POA: Diagnosis present

## 2020-09-16 DIAGNOSIS — I5033 Acute on chronic diastolic (congestive) heart failure: Secondary | ICD-10-CM

## 2020-09-16 DIAGNOSIS — I70461 Atherosclerosis of autologous vein bypass graft(s) of the extremities with gangrene, right leg: Principal | ICD-10-CM | POA: Diagnosis present

## 2020-09-16 DIAGNOSIS — E1142 Type 2 diabetes mellitus with diabetic polyneuropathy: Secondary | ICD-10-CM | POA: Diagnosis present

## 2020-09-16 DIAGNOSIS — Z86718 Personal history of other venous thrombosis and embolism: Secondary | ICD-10-CM

## 2020-09-16 DIAGNOSIS — E11649 Type 2 diabetes mellitus with hypoglycemia without coma: Secondary | ICD-10-CM | POA: Diagnosis present

## 2020-09-16 DIAGNOSIS — R000 Tachycardia, unspecified: Secondary | ICD-10-CM | POA: Diagnosis present

## 2020-09-16 DIAGNOSIS — Z79899 Other long term (current) drug therapy: Secondary | ICD-10-CM

## 2020-09-16 DIAGNOSIS — L03116 Cellulitis of left lower limb: Secondary | ICD-10-CM | POA: Diagnosis present

## 2020-09-16 DIAGNOSIS — T148XXA Other injury of unspecified body region, initial encounter: Secondary | ICD-10-CM | POA: Diagnosis present

## 2020-09-16 DIAGNOSIS — F10930 Alcohol use, unspecified with withdrawal, uncomplicated: Secondary | ICD-10-CM

## 2020-09-16 DIAGNOSIS — Z7982 Long term (current) use of aspirin: Secondary | ICD-10-CM

## 2020-09-16 DIAGNOSIS — E1169 Type 2 diabetes mellitus with other specified complication: Secondary | ICD-10-CM | POA: Diagnosis present

## 2020-09-16 HISTORY — DX: Essential (primary) hypertension: I10

## 2020-09-16 LAB — CBC WITH DIFFERENTIAL/PLATELET
Abs Immature Granulocytes: 0.02 10*3/uL (ref 0.00–0.07)
Basophils Absolute: 0.1 10*3/uL (ref 0.0–0.1)
Basophils Relative: 1 %
Eosinophils Absolute: 0.2 10*3/uL (ref 0.0–0.5)
Eosinophils Relative: 3 %
HCT: 28.8 % — ABNORMAL LOW (ref 39.0–52.0)
Hemoglobin: 9 g/dL — ABNORMAL LOW (ref 13.0–17.0)
Immature Granulocytes: 0 %
Lymphocytes Relative: 17 %
Lymphs Abs: 1.1 10*3/uL (ref 0.7–4.0)
MCH: 23.9 pg — ABNORMAL LOW (ref 26.0–34.0)
MCHC: 31.3 g/dL (ref 30.0–36.0)
MCV: 76.6 fL — ABNORMAL LOW (ref 80.0–100.0)
Monocytes Absolute: 1.3 10*3/uL — ABNORMAL HIGH (ref 0.1–1.0)
Monocytes Relative: 19 %
Neutro Abs: 4 10*3/uL (ref 1.7–7.7)
Neutrophils Relative %: 60 %
Platelets: 361 10*3/uL (ref 150–400)
RBC: 3.76 MIL/uL — ABNORMAL LOW (ref 4.22–5.81)
RDW: 21.2 % — ABNORMAL HIGH (ref 11.5–15.5)
Smear Review: NORMAL
WBC: 6.6 10*3/uL (ref 4.0–10.5)
nRBC: 0 % (ref 0.0–0.2)

## 2020-09-16 LAB — COMPREHENSIVE METABOLIC PANEL
ALT: 14 U/L (ref 0–44)
AST: 17 U/L (ref 15–41)
Albumin: 2.1 g/dL — ABNORMAL LOW (ref 3.5–5.0)
Alkaline Phosphatase: 80 U/L (ref 38–126)
Anion gap: 11 (ref 5–15)
BUN: 18 mg/dL (ref 8–23)
CO2: 18 mmol/L — ABNORMAL LOW (ref 22–32)
Calcium: 7.9 mg/dL — ABNORMAL LOW (ref 8.9–10.3)
Chloride: 100 mmol/L (ref 98–111)
Creatinine, Ser: 1.03 mg/dL (ref 0.61–1.24)
GFR, Estimated: >60 mL/min (ref >60)
Glucose, Bld: 82 mg/dL (ref 70–99)
Potassium: 4.2 mmol/L (ref 3.5–5.1)
Sodium: 129 mmol/L — ABNORMAL LOW (ref 135–145)
Total Bilirubin: 0.5 mg/dL (ref 0.3–1.2)
Total Protein: 6.7 g/dL (ref 6.5–8.1)

## 2020-09-16 LAB — PROTIME-INR
INR: 1 (ref 0.8–1.2)
Prothrombin Time: 13.6 seconds (ref 11.4–15.2)

## 2020-09-16 LAB — GLUCOSE, CAPILLARY: Glucose-Capillary: 96 mg/dL (ref 70–99)

## 2020-09-16 LAB — LACTIC ACID, PLASMA
Lactic Acid, Venous: 2 mmol/L (ref 0.5–1.9)
Lactic Acid, Venous: 1.6 mmol/L (ref 0.5–1.9)

## 2020-09-16 LAB — RESP PANEL BY RT-PCR (FLU A&B, COVID) ARPGX2
Influenza A by PCR: NEGATIVE
Influenza B by PCR: NEGATIVE
SARS Coronavirus 2 by RT PCR: NEGATIVE

## 2020-09-16 LAB — IRON AND TIBC
Iron: 18 ug/dL — ABNORMAL LOW (ref 45–182)
Saturation Ratios: 7 % — ABNORMAL LOW (ref 17.9–39.5)
TIBC: 276 ug/dL (ref 250–450)
UIBC: 258 ug/dL

## 2020-09-16 LAB — ETHANOL: Alcohol, Ethyl (B): 21 mg/dL — ABNORMAL HIGH (ref <10)

## 2020-09-16 LAB — VITAMIN B12: Vitamin B-12: 275 pg/mL (ref 180–914)

## 2020-09-16 LAB — BRAIN NATRIURETIC PEPTIDE: B Natriuretic Peptide: 20.2 pg/mL (ref 0.0–100.0)

## 2020-09-16 MED ORDER — SODIUM CHLORIDE 0.9 % IV SOLN
2.0000 g | Freq: Once | INTRAVENOUS | Status: AC
  Administered 2020-09-16: 2 g via INTRAVENOUS
  Filled 2020-09-16: qty 2

## 2020-09-16 MED ORDER — METOPROLOL SUCCINATE ER 25 MG PO TB24
25.0000 mg | ORAL_TABLET | Freq: Every day | ORAL | Status: DC
  Administered 2020-09-17 – 2020-09-23 (×6): 25 mg via ORAL
  Filled 2020-09-16 (×7): qty 1

## 2020-09-16 MED ORDER — LORAZEPAM 1 MG PO TABS
0.0000 mg | ORAL_TABLET | Freq: Two times a day (BID) | ORAL | Status: AC
  Administered 2020-09-18: 1 mg via ORAL

## 2020-09-16 MED ORDER — VANCOMYCIN HCL IN DEXTROSE 1-5 GM/200ML-% IV SOLN
1000.0000 mg | Freq: Once | INTRAVENOUS | Status: AC
  Administered 2020-09-16: 1000 mg via INTRAVENOUS
  Filled 2020-09-16: qty 200

## 2020-09-16 MED ORDER — SODIUM CHLORIDE 0.9% FLUSH
3.0000 mL | Freq: Two times a day (BID) | INTRAVENOUS | Status: DC
  Administered 2020-09-16 – 2020-09-21 (×9): 3 mL via INTRAVENOUS

## 2020-09-16 MED ORDER — MORPHINE SULFATE (PF) 4 MG/ML IV SOLN
4.0000 mg | Freq: Once | INTRAVENOUS | Status: AC
  Administered 2020-09-16: 4 mg via INTRAVENOUS
  Filled 2020-09-16: qty 1

## 2020-09-16 MED ORDER — THIAMINE HCL 100 MG/ML IJ SOLN
100.0000 mg | Freq: Every day | INTRAMUSCULAR | Status: DC
  Filled 2020-09-16 (×3): qty 2

## 2020-09-16 MED ORDER — LORAZEPAM 2 MG/ML IJ SOLN
0.0000 mg | Freq: Two times a day (BID) | INTRAMUSCULAR | Status: AC
Start: 2020-09-18 — End: 2020-09-20
  Administered 2020-09-18: 1 mg via INTRAVENOUS
  Filled 2020-09-16: qty 1

## 2020-09-16 MED ORDER — VANCOMYCIN HCL 750 MG/150ML IV SOLN
750.0000 mg | Freq: Once | INTRAVENOUS | Status: AC
  Administered 2020-09-16: 750 mg via INTRAVENOUS
  Filled 2020-09-16 (×2): qty 150

## 2020-09-16 MED ORDER — THIAMINE HCL 100 MG PO TABS
100.0000 mg | ORAL_TABLET | Freq: Every day | ORAL | Status: DC
  Administered 2020-09-16 – 2020-09-23 (×8): 100 mg via ORAL
  Filled 2020-09-16 (×8): qty 1

## 2020-09-16 MED ORDER — TAMSULOSIN HCL 0.4 MG PO CAPS
0.4000 mg | ORAL_CAPSULE | Freq: Every day | ORAL | Status: DC
  Administered 2020-09-17 – 2020-09-23 (×7): 0.4 mg via ORAL
  Filled 2020-09-16 (×7): qty 1

## 2020-09-16 MED ORDER — SODIUM CHLORIDE 0.9% FLUSH: 3.0000 mL | INTRAVENOUS | Status: DC | PRN

## 2020-09-16 MED ORDER — RIVAROXABAN 20 MG PO TABS
20.0000 mg | ORAL_TABLET | Freq: Every day | ORAL | Status: DC
  Administered 2020-09-16 – 2020-09-23 (×8): 20 mg via ORAL
  Filled 2020-09-16 (×9): qty 1

## 2020-09-16 MED ORDER — LORAZEPAM 1 MG PO TABS
0.0000 mg | ORAL_TABLET | Freq: Four times a day (QID) | ORAL | Status: AC
  Administered 2020-09-17: 1 mg via ORAL
  Filled 2020-09-16 (×2): qty 1

## 2020-09-16 MED ORDER — VANCOMYCIN HCL IN DEXTROSE 1-5 GM/200ML-% IV SOLN
1000.0000 mg | Freq: Two times a day (BID) | INTRAVENOUS | Status: DC
  Administered 2020-09-17: 1000 mg via INTRAVENOUS
  Filled 2020-09-16 (×2): qty 200

## 2020-09-16 MED ORDER — SODIUM CHLORIDE 0.9 % IV SOLN
250.0000 mL | INTRAVENOUS | Status: DC | PRN
  Administered 2020-09-16: 250 mL via INTRAVENOUS

## 2020-09-16 MED ORDER — LACTATED RINGERS IV BOLUS
1000.0000 mL | Freq: Once | INTRAVENOUS | Status: AC
  Administered 2020-09-16: 1000 mL via INTRAVENOUS

## 2020-09-16 MED ORDER — ASPIRIN 81 MG PO CHEW
81.0000 mg | CHEWABLE_TABLET | Freq: Every day | ORAL | Status: DC
  Administered 2020-09-17 – 2020-09-23 (×7): 81 mg via ORAL
  Filled 2020-09-16 (×7): qty 1

## 2020-09-16 MED ORDER — INSULIN ASPART 100 UNIT/ML IJ SOLN
0.0000 [IU] | Freq: Three times a day (TID) | INTRAMUSCULAR | Status: DC
  Filled 2020-09-16: qty 1

## 2020-09-16 MED ORDER — SODIUM CHLORIDE 0.9 % IV SOLN
1.0000 g | INTRAVENOUS | Status: DC
  Administered 2020-09-16 – 2020-09-17 (×2): 1 g via INTRAVENOUS
  Filled 2020-09-16 (×3): qty 10

## 2020-09-16 MED ORDER — LORAZEPAM 2 MG/ML IJ SOLN
0.0000 mg | Freq: Four times a day (QID) | INTRAMUSCULAR | Status: AC
Start: 2020-09-16 — End: 2020-09-18
  Administered 2020-09-16 (×2): 2 mg via INTRAVENOUS
  Administered 2020-09-17: 1 mg via INTRAVENOUS
  Administered 2020-09-17 (×2): 2 mg via INTRAVENOUS
  Filled 2020-09-16 (×5): qty 1

## 2020-09-16 NOTE — Consult Note (Signed)
Pharmacy Antibiotic Note  Brad Frost is a 65 y.o. male admitted on 09/16/2020 with cellulitis.  Pharmacy has been consulted for Vancomycin dosing.  Plan: Vanc 1750mg  loading dose (1000mg +750mg );  then 1000mg  q12h.  Goal AUC 400-600. Expected AUC: 558.4 SCr used: 1.03 (IBW: 73kg; Vd 0.72)  Cmin 16.4 F/u MRSA PCR  Height: 5\' 10"  (177.8 cm) Weight: 76.7 kg (169 lb) IBW/kg (Calculated) : 73  Temp (24hrs), Avg:98.3 F (36.8 C), Min:98.2 F (36.8 C), Max:98.3 F (36.8 C)  Recent Labs  Lab 09/16/20 1501 09/16/20 1526  WBC 6.6  --   CREATININE 1.03  --   LATICACIDVEN  --  2.0*    Estimated Creatinine Clearance: 72.8 mL/min (by C-G formula based on SCr of 1.03 mg/dL).    No Known Allergies  Antimicrobials this admission: Vanc (5/24 >>  CTX (5/24 >>   Dose adjustments this admission: CTM renal and adj prn  Microbiology results: 5/24 BCx: (2sets) sent/pending 5/24 MRSA PCR: Sent/pending  Thank you for allowing pharmacy to be a part of this patient's care.  11-19-1984 Brad Frost 09/16/2020 5:01 PM

## 2020-09-16 NOTE — ED Notes (Signed)
Report messaged to [employer].

## 2020-09-16 NOTE — ED Notes (Signed)
1 blue culture bottle sent to lab

## 2020-09-16 NOTE — ED Provider Notes (Signed)
[facility] Emergency Department Provider Note   ____________________________________________   Event Date/Time   First MD Initiated Contact with Patient 09/16/20 1425     (approximate)  I have reviewed the triage vital signs and the nursing notes.   HISTORY  Chief Complaint Wound Infection    HPI Brad Frost is a 65 y.o. male with past medical history of hypertension, diabetes, peripheral vascular disease, PE on Xarelto, and alcohol abuse who presents to the ED for wound infection.  Patient reports that he has had wounds to both of his legs for the past couple of months that have been gradually worsening.  He has started to notice drainage with a foul odor over the past few days and eventually decided to call EMS to get evaluated.  He denies any fevers, cough, chest pain, shortness of breath, nausea, or vomiting.  He admits to daily alcohol consumption, 3-4 beers daily with his last drink coming earlier today.  He states he has been compliant with his medications recently, including Xarelto.        Past Medical History:  Diagnosis Date  . Diabetes mellitus without complication (HCC)   . Hypertension   . Peripheral vascular disease [facility])     Patient Active Problem List   Diagnosis Date Noted  . COVID-19 04/13/2019  . Hypotension   . COVID-19 virus infection 04/04/2019  . Pneumonia due to COVID-19 virus   . Bilateral pulmonary embolism (HCC)   . Lung mass   . AKI (acute kidney injury) (HCC)   . Type 2 diabetes mellitus with diabetic neuropathy, without long-term current use of insulin (HCC)   . PVD (peripheral vascular disease) (HCC) 02/14/2015    Past Surgical History:  Procedure Laterality Date  . LEG SURGERY  August 20, 2014   Right Leg  BPG    Prior to Admission medications   Medication Sig Start Date End Date Taking? Authorizing Provider  acetaminophen (TYLENOL) 500 MG tablet Take 500 mg by mouth every 6 (six) hours as needed.     [provider]  aspirin 81 MG tablet Take 81 mg by mouth daily.    [provider]  atorvastatin (LIPITOR) 80 MG tablet Take 80 mg by mouth daily.    [provider]  cholecalciferol (VITAMIN D) 25 MCG tablet Take 1 tablet (1,000 Units total) by mouth daily. 04/10/19   Arnetha Courser, MD  cilostazol (PLETAL) 100 MG tablet Take 100 mg by mouth 2 (two) times daily.    [provider]  folic acid (FOLVITE) 1 MG tablet Take 1 mg by mouth daily.    [provider]  gabapentin (NEURONTIN) 300 MG capsule Take 600 mg by mouth 2 (two) times daily.     [provider]  metFORMIN (GLUCOPHAGE) 500 MG tablet Take 500 mg by mouth daily.    [provider]  metoprolol succinate (TOPROL-XL) 25 MG 24 hr tablet Take 1 tablet (25 mg total) by mouth daily. 04/09/19   Arnetha Courser, MD  oxycodone (OXY-IR) 5 MG capsule Take 5 mg by mouth every 4 (four) hours as needed for pain.    [provider]  polyethylene glycol (MIRALAX / GLYCOLAX) 17 g packet Take 17 g by mouth 2 (two) times daily.    [provider]  Rivaroxaban (XARELTO) 15 MG TABS tablet Take 1 tablet (15 mg total) by mouth 2 (two) times daily for 18 days. 04/09/19 04/27/19  Arnetha Courser, MD  rivaroxaban (XARELTO) 20 MG  TABS tablet Take 1 tablet (20 mg total) by mouth daily with supper. Starting from January 1 after finishing twice daily dose. 04/26/19   Arnetha Courser, MD  senna-docusate (SENOKOT-S) 8.6-50 MG tablet Take 1 tablet by mouth 2 (two) times daily.    [provider]  sulfamethoxazole-trimethoprim (BACTRIM DS) 800-160 MG tablet Take 1 tablet by mouth every 12 (twelve) hours. 04/14/19   Danford, Earl Lites, MD  tamsulosin (FLOMAX) 0.4 MG CAPS capsule Take 0.4 mg by mouth daily.    [provider]  thiamine (VITAMIN B-1) 100 MG tablet Take 200 mg by mouth daily.    [provider]  vitamin C (VITAMIN C) 1000 MG tablet Take 1 tablet (1,000 mg  total) by mouth 2 (two) times daily. 04/09/19   Arnetha Courser, MD  zinc sulfate 220 (50 Zn) MG capsule Take 220 mg by mouth 2 (two) times daily.    [provider]    Allergies Patient has no known allergies.  Family History  Problem Relation Age of Onset  . Hypertension Mother     Social History Social History   Tobacco Use  . Smoking status: Former Smoker    Types: Cigarettes  . Smokeless tobacco: Never Used  Substance Use Topics  . Alcohol use: Yes  . Drug use: No    Review of Systems  Constitutional: No fever/chills Eyes: No visual changes. ENT: No sore throat. Cardiovascular: Denies chest pain. Respiratory: Denies shortness of breath. Gastrointestinal: No abdominal pain.  No nausea, no vomiting.  No diarrhea.  No constipation. Genitourinary: Negative for dysuria. Musculoskeletal: Negative for back pain.  Positive for leg swelling and pain. Skin: Negative for rash.  Positive for leg wounds. Neurological: Negative for headaches, focal weakness or numbness.  ____________________________________________   PHYSICAL EXAM:  VITAL SIGNS: ED Triage Vitals  Enc Vitals Group     BP 09/16/20 1427 (!) 217/79     Pulse --      Resp --      Temp 09/16/20 1425 98.3 F (36.8 C)     Temp Source 09/16/20 1425 Oral     SpO2 --      Weight 09/16/20 1424 169 lb (76.7 kg)     Height 09/16/20 1424 5\' 10"  (1.778 m)     Head Circumference --      Peak Flow --      Pain Score 09/16/20 1423 7     Pain Loc --      Pain Edu? --      Excl. in GC? --     Constitutional: Alert and oriented. Eyes: Conjunctivae are normal. Head: Atraumatic. Nose: No congestion/rhinnorhea. Mouth/Throat: Mucous membranes are moist. Neck: Normal ROM Cardiovascular: Tachycardic, regular rhythm. Grossly normal heart sounds.  1+ DP pulses bilaterally. Respiratory: Normal respiratory effort.  No retractions. Lungs CTAB. Gastrointestinal: Soft and nontender. No distention. Genitourinary:  deferred Musculoskeletal: 2+ pitting edema to bilateral lower extremities with large areas of chronic appearing wounds.  Maggots noted in wounds to bilateral lower extremities.  Significant ulceration of right great toe with purulent drainage.  See picture below. Neurologic:  Normal speech and language.  Tremulous with no gross focal neurologic deficits appreciated. Skin:  Skin is warm, dry and intact. No rash noted. Psychiatric: Mood and affect are normal. Speech and behavior are normal.     ____________________________________________   LABS (all labs ordered are listed, but only abnormal results are displayed)  Labs Reviewed  COMPREHENSIVE METABOLIC PANEL - Abnormal; Notable for the following  components:      Result Value   Sodium 129 (*)    CO2 18 (*)    Calcium 7.9 (*)    Albumin 2.1 (*)    All other components within normal limits  LACTIC ACID, PLASMA - Abnormal; Notable for the following components:   Lactic Acid, Venous 2.0 (*)    All other components within normal limits  CBC WITH DIFFERENTIAL/PLATELET - Abnormal; Notable for the following components:   RBC 3.76 (*)    Hemoglobin 9.0 (*)    HCT 28.8 (*)    MCV 76.6 (*)    MCH 23.9 (*)    RDW 21.2 (*)    Monocytes Absolute 1.3 (*)    All other components within normal limits  CULTURE, BLOOD (ROUTINE X 2)  CULTURE, BLOOD (ROUTINE X 2)  RESP PANEL BY RT-PCR (FLU A&B, COVID) ARPGX2  PROTIME-INR  LACTIC ACID, PLASMA  URINALYSIS, COMPLETE (UACMP) WITH MICROSCOPIC  ETHANOL   ____________________________________________  EKG  ED ECG REPORT I, Chesley Noon, the attending physician, personally viewed and interpreted this ECG.   Date: 09/16/2020  EKG Time: 14:39  Rate: 114  Rhythm: sinus tachycardia  Axis: Normal  Intervals:none  ST&T Change: None   PROCEDURES  Procedure(s) performed (including Critical Care):  Procedures   ____________________________________________   INITIAL IMPRESSION /  ASSESSMENT AND PLAN / ED COURSE       65 year old male with past medical history of hypertension, diabetes, peripheral vascular disease, and DVT/PE on Xarelto who presents to the ED for worsening wounds to bilateral lower extremities over the past couple of months.  He has significant ulceration to bilateral distal lower extremities along with his right great toe.  There are maggots present and I am concerned for osteomyelitis, we will treat with cefepime and vancomycin.  Vital signs are reassuring and not consistent with sepsis, labs are pending.  He does have a history of peripheral vascular disease, currently has diminished pulses to bilateral lower extremities but no signs of acute ischemia.  We will treat pain with IV morphine, plan to discuss with hospitalist for admission.  Chest x-ray reviewed by me and shows no infiltrate, edema, or effusion.  X-ray of right foot shows no obvious signs of osteomyelitis, however clinical suspicion remains high for this.  Case discussed with hospitalist for admission and we will also touch base with podiatry.      ____________________________________________   FINAL CLINICAL IMPRESSION(S) / ED DIAGNOSES  Final diagnoses:  Wound infection  Alcohol withdrawal syndrome without complication [facility])     ED Discharge Orders    None       Note:  This document was prepared using Dragon voice recognition software and may include unintentional dictation errors.   Chesley Noon, MD 09/16/20 [phone]

## 2020-09-16 NOTE — H&P (Addendum)
Patient came from home, he uses a walker normally, independent of all ADLs. Chief Complaint: Bilateral leg swelling and pain. HPI: Brad Frost is an 65 y.o. male with a history of type 2 diabetes, essential hypertension, peripheral vascular disease and chronic alcohol drinking who present to the hospital complaining of bilateral leg swelling and pain.  Patient also has chronic leg edema appears to be lymphedema. Patient states that at baseline, he has some debility from lymphedema.  He normally sleep in a recliner every night, significant orthopnea and occasional paroxysmal dyspnea.  He has not gained any weight recently.  About 2 weeks ago, his leg edema seemed to be worse.  He also complaining of leg pain intermittently.  Increase the left leg, there is some draining.   ED course: Upon examination, his left leg is more swollen with draining, there are maggots come out of it.  He was afebrile with a temperature of 98.3.  BC 6.6, lactic acid 2.0.  Hemoglobin 9.0.  Bilateral leg x-ray did not show any bone infection.  He was given antibiotics with vancomycin and cefepime.  Consult from podiatry is obtained from the ED, Dr. Excell Seltzer will see patient tomorrow.  Vascular surgery also consulted.  Past Medical History:  Diagnosis Date  . Diabetes mellitus without complication (HCC)   . Hypertension   . Peripheral vascular disease [facility])     Past Surgical History:  Procedure Laterality Date  . LEG SURGERY  August 20, 2014   Right Leg  BPG    Family History  Problem Relation Age of Onset  . Hypertension Mother    Social History:  reports that he has quit smoking. His smoking use included cigarettes. He has never used smokeless tobacco. He reports current alcohol use. He reports that he does not use drugs.  Allergies: No Known Allergies  (Not in a hospital admission)   Results for orders placed or performed during the hospital encounter of 09/16/20 (from the past 48 hour(s))  Resp Panel by  RT-PCR (Flu A&B, Covid) Nasopharyngeal Swab     Status: None   Collection Time: 09/16/20  2:23 PM   Specimen: Nasopharyngeal Swab; Nasopharyngeal(NP) swabs in vial transport medium  Result Value Ref Range   SARS Coronavirus 2 by RT PCR NEGATIVE NEGATIVE    Comment: (NOTE) SARS-CoV-2 target nucleic acids are NOT DETECTED.  The SARS-CoV-2 RNA is generally detectable in upper respiratory specimens during the acute phase of infection. The lowest concentration of SARS-CoV-2 viral copies this assay can detect is 138 copies/mL. A negative result does not preclude SARS-Cov-2 infection and should not be used as the sole basis for treatment or other patient management decisions. A negative result may occur with  improper specimen collection/handling, submission of specimen other than nasopharyngeal swab, presence of viral mutation(s) within the areas targeted by this assay, and inadequate number of viral copies(<138 copies/mL). A negative result must be combined with clinical observations, patient history, and epidemiological information. The expected result is Negative.  Fact Sheet for Patients:  BloggerCourse.com  Fact Sheet for Healthcare Providers:  SeriousBroker.it  This test is no t yet approved or cleared by the [place] FDA and  has been authorized for detection and/or diagnosis of SARS-CoV-2 by FDA under an Emergency Use Authorization (EUA). This EUA will remain  in effect (meaning this test can be used) for the duration of the COVID-19 declaration under Section 564(b)(1) of the Act, 21 U.S.C.section 360bbb-3(b)(1), unless the authorization is terminated  or revoked sooner.  Influenza A by PCR NEGATIVE NEGATIVE   Influenza B by PCR NEGATIVE NEGATIVE    Comment: (NOTE) The Xpert Xpress SARS-CoV-2/FLU/RSV plus assay is intended as an aid in the diagnosis of influenza from Nasopharyngeal swab specimens and should not be  used as a sole basis for treatment. Nasal washings and aspirates are unacceptable for Xpert Xpress SARS-CoV-2/FLU/RSV testing.  Fact Sheet for Patients: BloggerCourse.com  Fact Sheet for Healthcare Providers: SeriousBroker.it  This test is not yet approved or cleared by the [place] FDA and has been authorized for detection and/or diagnosis of SARS-CoV-2 by FDA under an Emergency Use Authorization (EUA). This EUA will remain in effect (meaning this test can be used) for the duration of the COVID-19 declaration under Section 564(b)(1) of the Act, 21 U.S.C. section 360bbb-3(b)(1), unless the authorization is terminated or revoked.  Performed at [facility], [address] [place], [place] [address]   Comprehensive metabolic panel     Status: Abnormal   Collection Time: 09/16/20  3:01 PM  Result Value Ref Range   Sodium 129 (L) 135 - 145 mmol/L   Potassium 4.2 3.5 - 5.1 mmol/L   Chloride 100 98 - 111 mmol/L   CO2 18 (L) 22 - 32 mmol/L   Glucose, Bld 82 70 - 99 mg/dL    Comment: Glucose reference range applies only to samples taken after fasting for at least 8 hours.   BUN 18 8 - 23 mg/dL   Creatinine, Ser 7.89 0.61 - 1.24 mg/dL   Calcium 7.9 (L) 8.9 - 10.3 mg/dL   Total Protein 6.7 6.5 - 8.1 g/dL   Albumin 2.1 (L) 3.5 - 5.0 g/dL   AST 17 15 - 41 U/L   ALT 14 0 - 44 U/L   Alkaline Phosphatase 80 38 - 126 U/L   Total Bilirubin 0.5 0.3 - 1.2 mg/dL   GFR, Estimated >38 >10 mL/min    Comment: (NOTE) Calculated using the CKD-EPI Creatinine Equation (2021)    Anion gap 11 5 - 15    Comment: Performed at [facility], [address] [place], [place] [address]  CBC with Differential     Status: Abnormal   Collection Time: 09/16/20  3:01 PM  Result Value Ref Range   WBC 6.6 4.0 - 10.5 K/uL   RBC 3.76 (L) 4.22 - 5.81 MIL/uL   Hemoglobin 9.0 (L) 13.0 - 17.0 g/dL   HCT 25.8 (L) 52.7 - 78.2 %   MCV 76.6  (L) 80.0 - 100.0 fL   MCH 23.9 (L) 26.0 - 34.0 pg   MCHC 31.3 30.0 - 36.0 g/dL   RDW 42.3 (H) 53.6 - 14.4 %   Platelets 361 150 - 400 K/uL   nRBC 0.0 0.0 - 0.2 %   Neutrophils Relative % 60 %   Neutro Abs 4.0 1.7 - 7.7 K/uL   Lymphocytes Relative 17 %   Lymphs Abs 1.1 0.7 - 4.0 K/uL   Monocytes Relative 19 %   Monocytes Absolute 1.3 (H) 0.1 - 1.0 K/uL   Eosinophils Relative 3 %   Eosinophils Absolute 0.2 0.0 - 0.5 K/uL   Basophils Relative 1 %   Basophils Absolute 0.1 0.0 - 0.1 K/uL   WBC Morphology MORPHOLOGY UNREMARKABLE    RBC Morphology HYPOCHROMASIA    Smear Review Normal platelet morphology    Immature Granulocytes 0 %   Abs Immature Granulocytes 0.02 0.00 - 0.07 K/uL   Ovalocytes PRESENT     Comment: Performed at [facility], [address] [place], [place] [address]  Protime-INR     Status: None   Collection Time: 09/16/20  3:01 PM  Result Value Ref Range   Prothrombin Time 13.6 11.4 - 15.2 seconds   INR 1.0 0.8 - 1.2    Comment: (NOTE) INR goal varies based on device and disease states. Performed at [facility], [address] [place], [place] [address]   Ethanol     Status: Abnormal   Collection Time: 09/16/20  3:01 PM  Result Value Ref Range   Alcohol, Ethyl (B) 21 (H) <10 mg/dL    Comment: (NOTE) Lowest detectable limit for serum alcohol is 10 mg/dL.  For medical purposes only. Performed at [facility], [address] [place], [place] [address]   Lactic acid, plasma     Status: Abnormal   Collection Time: 09/16/20  3:26 PM  Result Value Ref Range   Lactic Acid, Venous 2.0 (HH) 0.5 - 1.9 mmol/L    Comment: CRITICAL RESULT CALLED TO, READ BACK BY AND VERIFIED WITH AMY COYNE @1601  09/16/20 MJU Performed at [facility], [address] [place], [place] [address]    DG Chest Port 1 View  Result Date: 09/16/2020 CLINICAL DATA:  Bilateral leg infections. EXAM: PORTABLE CHEST 1 VIEW COMPARISON:  Chest x-ray  04/12/2019. FINDINGS: Mediastinum hilar structures normal. Low lung volumes. Mild left base atelectasis/scarring. Mild left base infiltrate cannot be excluded. Stable elevation left hemidiaphragm. No pleural effusion or pneumothorax. IMPRESSION: Mild left base atelectasis/scarring. Mild left base infiltrate cannot be excluded. Stable elevation left hemidiaphragm. Electronically Signed   By: Maisie Fushomas  Register   On: 09/16/2020 15:31   DG Foot 2 Views Right  Result Date: 09/16/2020 CLINICAL DATA:  ?Osteomyelitis EXAM: RIGHT FOOT - 2 VIEW COMPARISON:  None. FINDINGS: Diffuse osteopenia. There is diffuse soft tissue swelling with possible wound at the lateral hindfoot. There is no evidence of acute fracture. There is mild first MTP degenerative change in additional scattered interphalangeal joint degenerative change. Vascular calcifications. IMPRESSION: No radiographic evidence of osteomyelitis. MRI would be more sensitive. Diffuse soft tissue swelling with possible wound along the lateral hindfoot. Diffuse osteopenia.  Mild first MTP degenerative arthritis. Electronically Signed   By: Caprice RenshawJacob  Kahn   On: 09/16/2020 15:32    Review of Systems  Constitutional: Negative for activity change, diaphoresis, fatigue, fever and unexpected weight change.  HENT: Negative for congestion, nosebleeds and postnasal drip.   Eyes: Negative for photophobia and redness.  Respiratory: Positive for cough and shortness of breath. Negative for apnea, choking, chest tightness and wheezing.   Cardiovascular: Positive for leg swelling. Negative for chest pain and palpitations.  Gastrointestinal: Negative for abdominal distention, abdominal pain, constipation, diarrhea and nausea.  Endocrine: Negative for polydipsia and polyphagia.  Genitourinary: Negative for dysuria, frequency and hematuria.  Musculoskeletal: Negative for arthralgias, back pain, joint swelling and myalgias.  Neurological: Negative for dizziness, seizures, syncope  and headaches.  Psychiatric/Behavioral: Negative for confusion and hallucinations.    Blood pressure 118/70, pulse (!) 114, temperature 98.2 F (36.8 C), temperature source Oral, resp. rate 19, height 5\' 10"  (1.778 m), weight 76.7 kg, SpO2 99 %. Physical Exam Constitutional:      General: He is not in acute distress.    Appearance: He is ill-appearing. He is not toxic-appearing or diaphoretic.  HENT:     Head: Normocephalic and atraumatic.     Nose: No congestion or rhinorrhea.     Mouth/Throat:  Mouth: Mucous membranes are moist.     Pharynx: No oropharyngeal exudate.  Eyes:     Extraocular Movements: Extraocular movements intact.     Conjunctiva/sclera: Conjunctivae normal.     Pupils: Pupils are equal, round, and reactive to light.  Cardiovascular:     Rate and Rhythm: Normal rate and regular rhythm.     Heart sounds: No murmur heard. No friction rub. No gallop.   Pulmonary:     Breath sounds: Wheezing and rales present.  Abdominal:     General: Abdomen is flat. Bowel sounds are normal. There is no distension.     Palpations: Abdomen is soft.     Tenderness: There is no abdominal tenderness.  Musculoskeletal:        General: Normal range of motion.     Cervical back: Normal range of motion and neck supple. No rigidity.     Right lower leg: Edema present.     Left lower leg: Edema present.     Comments: Severe bilateral leg edema, with ulceration in left leg with a draining.  Maggots found in the wound.  Swelling is worse in the left leg than the right.  Lymphadenopathy:     Cervical: No cervical adenopathy.  Skin:    General: Skin is warm and dry.     Coloration: Skin is not jaundiced.  Neurological:     General: No focal deficit present.     Mental Status: He is alert and oriented to person, place, and time.     Cranial Nerves: No cranial nerve deficit.  Psychiatric:        Mood and Affect: Mood normal.        Judgment: Judgment normal.       Assessment/Plan #1. Bilateral lower extremity cellulitis.    Patient has a chronic lymphedema.  Patient seemed to have significant infection in bilateral lower extremities more on the left side. Blood culture has been sent out.  Consult podiatry for debridement. We will continue antibiotics with Rocephin and vancomycin.  #2.  Likely chronic diastolic congestive heart failure. Patient has significant orthopnea and paroxysmal edema chronically.  The symptoms seem to be worse recently.  Examination showed crackles in bilateral lungs.  We will check a BNP, obtain chest x-ray, also obtain echocardiogram.  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  Alcohol drinking. Patient drinking beer quite heavily.  Currently no evidence of withdrawal.  We will continue CIWA protocol.  Continue thiamine and folic acid.  4.  Type 2 diabetes with peripheral neuropathy. Peripheral vascular disease. Continue sliding scale insulin. Obtain vascular surgery consult per request from podiatry.  #5.  Hyponatremia Most likely secondary to drinking beers, will recheck BMP tomorrow.  Patient does not have any dehydration.  6.  Anemia. Check iron B12 level per  7.  Lactic acidosis. Appear to be secondary to metformin. Patient does not meet sepsis criteria.  #8.  History of DVT. Continue anticoagulation.  Marrion Coy, MD 09/16/2020, 4:51 PM

## 2020-09-16 NOTE — ED Triage Notes (Signed)
Pt to ED [facility] from home for bilateral leg infection for months. Maggots noted to both legs. Ems reports VSS Pt reports ETOH usage every day, last drink today Pt alert and oriented

## 2020-09-16 NOTE — ED Notes (Signed)
Difficulty obtaining blood, Multiple RN attempt. Lab contacted for labs, Dr Larinda Buttery notified.  Pt noted to have tremors, Dr Larinda Buttery notified

## 2020-09-17 ENCOUNTER — Encounter: Payer: Self-pay | Admitting: Internal Medicine

## 2020-09-17 DIAGNOSIS — L089 Local infection of the skin and subcutaneous tissue, unspecified: Secondary | ICD-10-CM

## 2020-09-17 DIAGNOSIS — A419 Sepsis, unspecified organism: Secondary | ICD-10-CM

## 2020-09-17 DIAGNOSIS — I739 Peripheral vascular disease, unspecified: Secondary | ICD-10-CM | POA: Diagnosis not present

## 2020-09-17 DIAGNOSIS — E1140 Type 2 diabetes mellitus with diabetic neuropathy, unspecified: Secondary | ICD-10-CM

## 2020-09-17 DIAGNOSIS — T148XXA Other injury of unspecified body region, initial encounter: Secondary | ICD-10-CM

## 2020-09-17 DIAGNOSIS — L97511 Non-pressure chronic ulcer of other part of right foot limited to breakdown of skin: Secondary | ICD-10-CM

## 2020-09-17 DIAGNOSIS — I5033 Acute on chronic diastolic (congestive) heart failure: Secondary | ICD-10-CM

## 2020-09-17 DIAGNOSIS — L97519 Non-pressure chronic ulcer of other part of right foot with unspecified severity: Secondary | ICD-10-CM | POA: Diagnosis present

## 2020-09-17 DIAGNOSIS — L03119 Cellulitis of unspecified part of limb: Secondary | ICD-10-CM

## 2020-09-17 DIAGNOSIS — F10230 Alcohol dependence with withdrawal, uncomplicated: Secondary | ICD-10-CM

## 2020-09-17 LAB — BASIC METABOLIC PANEL
Anion gap: 8 (ref 5–15)
BUN: 14 mg/dL (ref 8–23)
CO2: 20 mmol/L — ABNORMAL LOW (ref 22–32)
Calcium: 7.9 mg/dL — ABNORMAL LOW (ref 8.9–10.3)
Chloride: 103 mmol/L (ref 98–111)
Creatinine, Ser: 0.82 mg/dL (ref 0.61–1.24)
GFR, Estimated: >60 mL/min (ref >60)
Glucose, Bld: 78 mg/dL (ref 70–99)
Potassium: 4.5 mmol/L (ref 3.5–5.1)
Sodium: 131 mmol/L — ABNORMAL LOW (ref 135–145)

## 2020-09-17 LAB — CBC
HCT: 26.9 % — ABNORMAL LOW (ref 39.0–52.0)
Hemoglobin: 8.7 g/dL — ABNORMAL LOW (ref 13.0–17.0)
MCH: 24 pg — ABNORMAL LOW (ref 26.0–34.0)
MCHC: 32.3 g/dL (ref 30.0–36.0)
MCV: 74.1 fL — ABNORMAL LOW (ref 80.0–100.0)
Platelets: 372 10*3/uL (ref 150–400)
RBC: 3.63 MIL/uL — ABNORMAL LOW (ref 4.22–5.81)
RDW: 21 % — ABNORMAL HIGH (ref 11.5–15.5)
WBC: 9.6 10*3/uL (ref 4.0–10.5)
nRBC: 0 % (ref 0.0–0.2)

## 2020-09-17 LAB — ECHOCARDIOGRAM COMPLETE
AR max vel: 1.73 cm2
AV Area VTI: 2.17 cm2
AV Area mean vel: 1.83 cm2
AV Mean grad: 10 mmHg
AV Peak grad: 15.4 mmHg
Ao pk vel: 1.96 m/s
Area-P 1/2: 3.85 cm2
Height: 70 in
S' Lateral: 2.69 cm
Weight: 2704 oz

## 2020-09-17 LAB — GLUCOSE, CAPILLARY
Glucose-Capillary: 73 mg/dL (ref 70–99)
Glucose-Capillary: 87 mg/dL (ref 70–99)
Glucose-Capillary: 77 mg/dL (ref 70–99)
Glucose-Capillary: 102 mg/dL — ABNORMAL HIGH (ref 70–99)

## 2020-09-17 LAB — HIV ANTIBODY (ROUTINE TESTING W REFLEX): HIV Screen 4th Generation wRfx: NONREACTIVE

## 2020-09-17 LAB — HEMOGLOBIN A1C
Hgb A1c MFr Bld: 5.7 % — ABNORMAL HIGH (ref 4.8–5.6)
Mean Plasma Glucose: 117 mg/dL

## 2020-09-17 MED ORDER — IBUPROFEN 400 MG PO TABS
400.0000 mg | ORAL_TABLET | ORAL | Status: DC | PRN
  Administered 2020-09-18 – 2020-09-20 (×4): 400 mg via ORAL
  Filled 2020-09-17 (×4): qty 1

## 2020-09-17 MED ORDER — VANCOMYCIN HCL 1000 MG/200ML IV SOLN
1000.0000 mg | Freq: Two times a day (BID) | INTRAVENOUS | Status: DC
  Administered 2020-09-17 – 2020-09-18 (×2): 1000 mg via INTRAVENOUS
  Filled 2020-09-17 (×4): qty 200

## 2020-09-17 MED ORDER — ACETAMINOPHEN 325 MG PO TABS
650.0000 mg | ORAL_TABLET | ORAL | Status: DC | PRN
  Administered 2020-09-17 – 2020-09-19 (×3): 650 mg via ORAL
  Filled 2020-09-17 (×5): qty 2

## 2020-09-17 NOTE — Consult Note (Addendum)
[facility] Vascular Consult Note  MRN : [id number]  Brad Frost is a 65 y.o. ([birth year]) male who presents with chief complaint of  Chief Complaint  Patient presents with  . Wound Infection  .  History of Present Illness:   I am asked to evaluate the patient by Dr. Chipper Herb.   The patient is seen for evaluation of multiple ulcerations bilateral lower extremities associated with diminished pulses.  This evening upon talking with the patient he is lethargic and is having great difficulty maintaining focus he seems somewhat confused.  He has a history of known atherosclerotic occlusive disease.  He is status post a right femoral-popliteal bypass in 2016 this was performed at the [facility] [place].  Bypass was indicated secondary nonhealing right calf ulceration.  He subsequently followed up with the [place] [facility] vein [facility].  However, his last documented follow-up at [facility] was in October 2016.  The patient denies rest pain or dangling of an extremity off the side of the bed during the night for relief.  No history of back problems or DJD of the lumbar sacral spine.   The patient's blood pressure has been stable and relatively well controlled. The patient denies amaurosis fugax or recent TIA symptoms. There are no recent neurological changes noted. The patient denies history of DVT, PE or superficial thrombophlebitis. The patient denies recent episodes of angina or shortness of breath.   No outpatient medications have been marked as taking for the 09/16/20 encounter [facility] Encounter).    Past Medical History:  Diagnosis Date  . Diabetes mellitus without complication (HCC)   . Hypertension   . Peripheral vascular disease [facility])     Past Surgical History:  Procedure Laterality Date  . LEG SURGERY  August 20, 2014   Right Leg  BPG    Social History Social History   Tobacco Use  . Smoking status: Former Smoker    Types: Cigarettes  .  Smokeless tobacco: Never Used  Substance Use Topics  . Alcohol use: Yes  . Drug use: No    Family History Family History  Problem Relation Age of Onset  . Hypertension Mother   No family history of bleeding/clotting disorders, porphyria or autoimmune disease   No Known Allergies   REVIEW OF SYSTEMS (Negative unless checked)  Constitutional: [] Weight loss  [] Fever  [] Chills Cardiac: [] Chest pain   [] Chest pressure   [] Palpitations   [] Shortness of breath when laying flat   [] Shortness of breath at rest   [] Shortness of breath with exertion. Vascular:  [] Pain in legs with walking   [] Pain in legs at rest   [] Pain in legs when laying flat   [x] Claudication   [] Pain in feet when walking  [] Pain in feet at rest  [] Pain in feet when laying flat   [] History of DVT   [] Phlebitis   [] Swelling in legs   [] Varicose veins   [x] Non-healing ulcers Pulmonary:   [] Uses home oxygen   [] Productive cough   [] Hemoptysis   [] Wheeze  [] COPD   [] Asthma Neurologic:  [] Dizziness  [] Blackouts   [] Seizures   [] History of stroke   [] History of TIA  [] Aphasia   [] Temporary blindness   [] Dysphagia   [] Weakness or numbness in arms   [] Weakness or numbness in legs Musculoskeletal:  [] Arthritis   [] Joint swelling   [x] Joint pain   [] Low back pain Hematologic:  [] Easy bruising  [] Easy bleeding   [] Hypercoagulable state   [] Anemic  [] Hepatitis Gastrointestinal:  []   Blood in stool   Vomiting blood  Gastroesophageal reflux/heartburn   Difficulty swallowing. Genitourinary:  Chronic kidney disease   Difficult urination  Frequent urination  Burning with urination   Blood in urine Skin:  Rashes   Ulcers   Wounds Psychological:  History of anxiety    History of major depression.  Physical Examination  Vitals:   09/17/20 1121 09/17/20 1200 09/17/20 1500 09/17/20 1603  BP: 103/63 108/63 115/61 107/63  Pulse: (!) 108 (!) 120 (!) 119 (!) 110  Resp: 16   16  Temp: 100 F (37.8 C)   99.3 F (37.4 C)   TempSrc: Oral   Oral  SpO2: (!) 82%   96%  Weight:      Height:       Body mass index is 24.25 kg/m. Gen:  WD/WN, NAD Head: [place]/AT, No temporalis wasting. Prominent temp pulse not noted. Ear/Nose/Throat: Hearing grossly intact, nares w/o erythema or drainage, oropharynx w/o Erythema/Exudate Eyes: PERRLA, EOMI.  Neck: Supple, no nuchal rigidity.  No bruit or JVD.  Pulmonary:  Good air movement, clear to auscultation bilaterally.  Cardiac: RRR, normal S1, S2, no Murmurs, rubs or gallops. Vascular: The patient has severe bilateral skin changes consistent with poorly controlled lymphedema associated with severe venous insufficiency.  He has profound cobblestoning.  There are circumferential wounds which have been dressed by podiatry.  He also has gangrenous changes to 2 toes on the right foot. Vessel Right Left  Radial Palpable Palpable  PT  not palpable  not palpable  DP  not palpable  not palpable  Gastrointestinal: soft, non-tender/non-distended. No guarding/reflex. No masses, surgical incisions, or scars. Musculoskeletal: M/S 5/5 throughout.  Extremities without ischemic changes.  No deformity or atrophy. No edema. Neurologic: CN 2-12 intact. Pain and light touch intact in extremities.  Symmetrical.  Speech is fluent. Motor exam as listed above. Psychiatric: Judgment intact, Mood & affect appropriate for pt's clinical situation. Dermatologic: Severe venous rashes with ulcers noted.  No cellulitis or open wounds. Lymph : Profound cobblestoning and lichenification changes consistent with lymphedema    CBC Lab Results  Component Value Date   WBC 9.6 09/17/2020   HGB 8.7 (L) 09/17/2020   HCT 26.9 (L) 09/17/2020   MCV 74.1 (L) 09/17/2020   PLT 372 09/17/2020    BMET    Component Value Date/Time   NA 131 (L) 09/17/2020 0722   K 4.5 09/17/2020 0722   CL 103 09/17/2020 0722   CO2 20 (L) 09/17/2020 0722   GLUCOSE 78 09/17/2020 0722   BUN 14 09/17/2020 0722   CREATININE 0.82  09/17/2020 0722   CALCIUM 7.9 (L) 09/17/2020 0722   GFRNONAA >60 09/17/2020 0722   GFRAA >60 04/14/2019 1345   Estimated Creatinine Clearance: 91.5 mL/min (by C-G formula based on SCr of 0.82 mg/dL).  COAG Lab Results  Component Value Date   INR 1.0 09/16/2020   INR 1.0 04/14/2019   INR 1.3 (H) 04/04/2019    Radiology DG Chest Port 1 View  Result Date: 09/16/2020 CLINICAL DATA:  Bilateral leg infections. EXAM: PORTABLE CHEST 1 VIEW COMPARISON:  Chest x-ray 04/12/2019. FINDINGS: Mediastinum hilar structures normal. Low lung volumes. Mild left base atelectasis/scarring. Mild left base infiltrate cannot be excluded. Stable elevation left hemidiaphragm. No pleural effusion or pneumothorax. IMPRESSION: Mild left base atelectasis/scarring. Mild left base infiltrate cannot be excluded. Stable elevation left hemidiaphragm. Electronically Signed   By: Maisie Fus  Register   On: 09/16/2020 15:31   DG Foot 2 Views Right  Result Date: 09/16/2020 CLINICAL DATA:  ?Osteomyelitis EXAM: RIGHT FOOT - 2 VIEW COMPARISON:  None. FINDINGS: Diffuse osteopenia. There is diffuse soft tissue swelling with possible wound at the lateral hindfoot. There is no evidence of acute fracture. There is mild first MTP degenerative change in additional scattered interphalangeal joint degenerative change. Vascular calcifications. IMPRESSION: No radiographic evidence of osteomyelitis. MRI would be more sensitive. Diffuse soft tissue swelling with possible wound along the lateral hindfoot. Diffuse osteopenia.  Mild first MTP degenerative arthritis. Electronically Signed   By: Caprice Renshaw   On: 09/16/2020 15:32   ECHOCARDIOGRAM COMPLETE  Result Date: 09/17/2020    ECHOCARDIOGRAM REPORT   Patient Name:   ZEPHYR RIDLEY [facility] Date of Exam: 09/16/2020 Medical Rec #:  [id number]         Height:       70.0 in Accession #:    [id number]        Weight:       169.0 lb Date of Birth:  02-Jul-1955          BSA:          1.943 m Patient Age:    64 years           BP:           116/72 mmHg Patient Gender: M                 HR:           126 bpm. Exam Location:  [facility] Procedure: 2D Echo, Cardiac Doppler and Color Doppler Indications:     I50.31 Acute Diastolic CHF  History:         Patient has prior history of Echocardiogram examinations, most                  recent 04/13/2019. Risk Factors:Hypertension and Diabetes.                  Peripheral vascular disease.  Sonographer:     Sedonia Small Rodgers-Jones Referring Phys:  [id number] Marrion Coy Diagnosing Phys: Arnoldo Hooker MD IMPRESSIONS  1. Left ventricular ejection fraction, by estimation, is 65 to 70%. The left ventricle has normal function. The left ventricle has no regional wall motion abnormalities. Left ventricular diastolic parameters were normal.  2. Right ventricular systolic function is normal. The right ventricular size is normal.  3. The mitral valve is normal in structure. Trivial mitral valve regurgitation.  4. The aortic valve is normal in structure. Aortic valve regurgitation is trivial. FINDINGS  Left Ventricle: Left ventricular ejection fraction, by estimation, is 65 to 70%. The left ventricle has normal function. The left ventricle has no regional wall motion abnormalities. The left ventricular internal cavity size was normal in size. There is  no left ventricular hypertrophy. Left ventricular diastolic parameters were normal. Right Ventricle: The right ventricular size is normal. No increase in right ventricular wall thickness. Right ventricular systolic function is normal. Left Atrium: Left atrial size was normal in size. Right Atrium: Right atrial size was normal in size. Pericardium: There is no evidence of pericardial effusion. Mitral Valve: The mitral valve is normal in structure. Trivial mitral valve regurgitation. Tricuspid Valve: The tricuspid valve is normal in structure. Tricuspid valve regurgitation is trivial. Aortic Valve: The aortic valve is normal in structure. Aortic valve  regurgitation is trivial. Aortic valve mean gradient measures 10.0 mmHg. Aortic valve peak gradient measures 15.4 mmHg. Aortic valve area, by VTI measures 2.17 cm. Pulmonic Valve: The pulmonic valve  was normal in structure. Pulmonic valve regurgitation is not visualized. Aorta: The aortic root and ascending aorta are structurally normal, with no evidence of dilitation. IAS/Shunts: No atrial level shunt detected by color flow Doppler.  LEFT VENTRICLE PLAX 2D LVIDd:         3.65 cm  Diastology LVIDs:         2.69 cm  LV e' medial:    10.10 cm/s LV PW:         0.92 cm  LV E/e' medial:  8.3 LV IVS:        1.00 cm  LV e' lateral:   12.30 cm/s LVOT diam:     2.00 cm  LV E/e' lateral: 6.8 LV SV:         55 LV SV Index:   28 LVOT Area:     3.14 cm  RIGHT VENTRICLE RV Basal diam:  3.37 cm RV S prime:     23.60 cm/s TAPSE (M-mode): 2.5 cm LEFT ATRIUM             Index       RIGHT ATRIUM           Index LA diam:        4.10 cm 2.11 cm/m  RA Area:     11.20 cm LA Vol (A2C):   41.7 ml 21.46 ml/m RA Volume:   24.90 ml  12.81 ml/m LA Vol (A4C):   26.7 ml 13.74 ml/m LA Biplane Vol: 35.8 ml 18.42 ml/m  AORTIC VALVE AV Area (Vmax):    1.73 cm AV Area (Vmean):   1.83 cm AV Area (VTI):     2.17 cm AV Vmax:           196.00 cm/s AV Vmean:          148.000 cm/s AV VTI:            0.254 m AV Peak Grad:      15.4 mmHg AV Mean Grad:      10.0 mmHg LVOT Vmax:         108.00 cm/s LVOT Vmean:        86.100 cm/s LVOT VTI:          0.176 m LVOT/AV VTI ratio: 0.69  AORTA Ao Root diam: 3.10 cm MITRAL VALVE                TRICUSPID VALVE MV Area (PHT): 3.85 cm     TR Peak grad:   22.3 mmHg MV Decel Time: 197 msec     TR Vmax:        236.00 cm/s MV E velocity: 83.40 cm/s MV A velocity: 107.00 cm/s  SHUNTS MV E/A ratio:  0.78         Systemic VTI:  0.18 m                             Systemic Diam: 2.00 cm Arnoldo Hooker MD Electronically signed by Arnoldo Hooker MD Signature Date/Time: 09/17/2020/12:36:11 PM    Final       Assessment/Plan 1.  Atherosclerotic occlusive disease bilateral lower extremities:  Recommend:  The patient has evidence of severe atherosclerotic changes of both lower extremities associated with ulceration and tissue loss of the right foot and calf as well as the left calf.  This represents a limb threatening ischemia and places the patient at the risk for limb loss bilaterally.  Patient should undergo  angiography of the lower extremities with the hope for intervention for limb salvage.  The risks and benefits as well as the alternative therapies was discussed in detail with the patient.  All questions were answered.  Patient agrees to proceed with angiography however given his current mental status I believe he may be withdrawing from his alcohol and not yet in a position for intervention.  This will need to be reassessed and I will potentially plan for angiography on Friday  2.  Alcohol dependency: Patient is likely withdrawing from his alcohol he is currently on the CIWA protocol we will base further interventions on his mental status changes.  3.  Ulcerations bilateral lower extremities: Podiatry following currently his legs are dressed with Ace wraps to give some compression.  4.  Diabetes mellitus: Continue hypoglycemic medications as already ordered, these medications have been reviewed and there are no changes at this time.  Hgb A1C to be monitored as already arranged by primary service.     Levora DredgeGregory Jacobey Gura, MD  09/17/2020 7:09 PM

## 2020-09-17 NOTE — Progress Notes (Addendum)
Progress Note    Brad RichardFrederick L Ellegood  ZOX:[id number] DOB: 05/16/1955  DOA: 09/16/2020 PCP: [name], [place], MD      Brief Narrative:    Medical records reviewed and are as summarized below:  Brad Frost is a 65 y.o. male       Assessment/Plan:   Active Problems:   PVD (peripheral vascular disease) (HCC)   Type 2 diabetes mellitus with diabetic neuropathy, without long-term current use of insulin (HCC)   Cellulitis of lower extremity   Toe ulcer, right (HCC)   Body mass index is 24.25 kg/m.   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  Patient has been evaluated by the podiatrist.  No immediate plans for surgery.  He recommended that patient be discharged on 7-day course of antibiotics.    Appreciate wound care nurse's recommendation.  Consult vascular surgeon for further evaluation.  Type II DM with peripheral neuropathy: NovoLog as needed for hyperglycemia  Alcohol use disorder with alcohol withdrawal syndrome.: Ativan as needed.  CIWA protocol  Sinus tachycardia: This may be due to alcohol withdrawal syndrome.  Lactic acidosis: Improved.  Probably from metformin.  History of DVT: Continue Xarelto  BNP was normal.  2D echo was unremarkable.  No evidence of CHF at this time.   Diet Order            Diet heart healthy/carb modified Room service appropriate? Yes; Fluid consistency: Thin  Diet effective now                    Consultants:  Podiatrist  Procedures:  None    Medications:   . aspirin  81 mg Oral Daily  . insulin aspart  0-9 Units Subcutaneous TID WC  . LORazepam  0-4 mg Intravenous Q6H   Or  . LORazepam  0-4 mg Oral Q6H  . [START ON 09/18/2020] LORazepam  0-4 mg Intravenous Q12H   Or  . [START ON 09/18/2020] LORazepam  0-4 mg Oral  Q12H  . metoprolol succinate  25 mg Oral Daily  . rivaroxaban  20 mg Oral Q supper  . sodium chloride flush  3 mL Intravenous Q12H  . tamsulosin  0.4 mg Oral Daily  . thiamine  100 mg Oral Daily   Or  . thiamine  100 mg Intravenous Daily   Continuous Infusions: . sodium chloride 10 mL/hr at 09/17/20 0300  . cefTRIAXone (ROCEPHIN)  IV 1 g (09/17/20 1815)  . vancomycin       Anti-infectives (From admission, onward)   Start     Dose/Rate Route Frequency Ordered Stop   09/17/20 2000  vancomycin (VANCOREADY) IVPB 1000 mg/200 mL        1,000 mg 200 mL/hr over 60 Minutes Intravenous Every 12 hours 09/17/20 1027     09/17/20 0800  vancomycin (VANCOCIN) IVPB 1000 mg/200 mL premix  Status:  Discontinued        1,000 mg 200 mL/hr over 60 Minutes Intravenous Every 12 hours 09/16/20 1723 09/17/20 1027   09/16/20 1800  cefTRIAXone (ROCEPHIN) 1 g in sodium chloride 0.9 % 100 mL IVPB        1 g 200 mL/hr over 30 Minutes Intravenous Every 24 hours 09/16/20 1648     09/16/20 1730  vancomycin (VANCOREADY) IVPB 750 mg/150 mL  750 mg 150 mL/hr over 60 Minutes Intravenous  Once 09/16/20 1723 09/16/20 2220   09/16/20 1445  ceFEPIme (MAXIPIME) 2 g in sodium chloride 0.9 % 100 mL IVPB        2 g 200 mL/hr over 30 Minutes Intravenous  Once 09/16/20 1442 09/16/20 1553   09/16/20 1445  vancomycin (VANCOCIN) IVPB 1000 mg/200 mL premix        1,000 mg 200 mL/hr over 60 Minutes Intravenous  Once 09/16/20 1442 09/16/20 1723             Family Communication/Anticipated D/C date and plan/Code Status   DVT prophylaxis:  rivaroxaban (XARELTO) tablet 20 mg     Code Status: Full Code  Family Communication: None Disposition Plan:    Status is: Inpatient  Remains inpatient appropriate because:IV treatments appropriate due to intensity of illness or inability to take PO and Inpatient level of care appropriate due to severity of illness   Dispo: The patient is from: Home               Anticipated d/c is to: Home              Patient currently is not medically stable to d/c.   Difficult to place patient No           Subjective:   Interval events noted.  He complains of pain and swelling in the legs.  Objective:    Vitals:   09/17/20 1121 09/17/20 1200 09/17/20 1500 09/17/20 1603  BP: 103/63 108/63 115/61 107/63  Pulse: (!) 108 (!) 120 (!) 119 (!) 110  Resp: 16   16  Temp: 100 F (37.8 C)   99.3 F (37.4 C)  TempSrc: Oral   Oral  SpO2: (!) 82%   96%  Weight:      Height:       No data found.   Intake/Output Summary (Last 24 hours) at 09/17/2020 1828 Last data filed at 09/17/2020 0300 Gross per 24 hour  Intake 130 ml  Output --  Net 130 ml   Filed Weights   09/16/20 1424  Weight: 76.7 kg    Exam:  GEN: NAD SKIN: Chronic skin changes suggestive of bilateral leg lymphedema EYES: No pallor or icterus ENT: MMM CV: RRR, tachycardic PULM: CTA B ABD: soft, ND, NT, +BS CNS: Drowsy but arousable during my exam, non focal EXT: Right hallux ulcer with dry gangrene        Data Reviewed:   I have personally reviewed following labs and imaging studies:  Labs: Labs show the following:   Basic Metabolic Panel: Recent Labs  Lab 09/16/20 1501 09/17/20 0722  NA 129* 131*  K 4.2 4.5  CL 100 103  CO2 18* 20*  GLUCOSE 82 78  BUN 18 14  CREATININE 1.03 0.82  CALCIUM 7.9* 7.9*   GFR Estimated Creatinine Clearance: 91.5 mL/min (by C-G formula based on SCr of 0.82 mg/dL). Liver Function Tests: Recent Labs  Lab 09/16/20 1501  AST 17  ALT 14  ALKPHOS 80  BILITOT 0.5  PROT 6.7  ALBUMIN 2.1*   No results for input(s): LIPASE, AMYLASE in the last 168 hours. No results for input(s): AMMONIA in the last 168 hours. Coagulation profile Recent Labs  Lab 09/16/20 1501  INR 1.0    CBC: Recent Labs  Lab 09/16/20 1501 09/17/20 0722  WBC 6.6 9.6  NEUTROABS 4.0  --   HGB 9.0* 8.7*  HCT 28.8* 26.9*  MCV 76.6* 74.1*  PLT 361 372    Cardiac Enzymes: No results for input(s): CKTOTAL, CKMB, CKMBINDEX, TROPONINI in the last 168 hours. BNP (last 3 results) No results for input(s): PROBNP in the last 8760 hours. CBG: Recent Labs  Lab 09/16/20 2153 09/17/20 0737 09/17/20 1122 09/17/20 1635  GLUCAP 96 73 87 77   D-Dimer: No results for input(s): DDIMER in the last 72 hours. Hgb A1c: No results for input(s): HGBA1C in the last 72 hours. Lipid Profile: No results for input(s): CHOL, HDL, LDLCALC, TRIG, CHOLHDL, LDLDIRECT in the last 72 hours. Thyroid function studies: No results for input(s): TSH, T4TOTAL, T3FREE, THYROIDAB in the last 72 hours.  Invalid input(s): FREET3 Anemia work up: Recent Labs    09/16/20 1501  VITAMINB12 275  TIBC 276  IRON 18*   Sepsis Labs: Recent Labs  Lab 09/16/20 1501 09/16/20 1526 09/16/20 1824 09/17/20 0722  WBC 6.6  --   --  9.6  LATICACIDVEN  --  2.0* 1.6  --     Microbiology Recent Results (from the past 240 hour(s))  Culture, blood (Routine x 2)     Status: None (Preliminary result)   Collection Time: 09/16/20  2:23 PM   Specimen: BLOOD  Result Value Ref Range Status   Specimen Description BLOOD BLOOD LEFT FOREARM  Final   Special Requests   Final    BOTTLES DRAWN AEROBIC ONLY Blood Culture results may not be optimal due to an inadequate volume of blood received in culture bottles   Culture   Final    NO GROWTH < 24 HOURS Performed at [facility], [address] [place], [place] [address]    Report Status PENDING  Incomplete  Resp Panel by RT-PCR (Flu A&B, Covid) Nasopharyngeal Swab     Status: None   Collection Time: 09/16/20  2:23 PM   Specimen: Nasopharyngeal Swab; Nasopharyngeal(NP) swabs in vial transport medium  Result Value Ref Range Status   SARS Coronavirus 2 by RT PCR NEGATIVE NEGATIVE Final    Comment: (NOTE) SARS-CoV-2 target nucleic acids are NOT DETECTED.  The SARS-CoV-2 RNA is generally detectable in upper  respiratory specimens during the acute phase of infection. The lowest concentration of SARS-CoV-2 viral copies this assay can detect is 138 copies/mL. A negative result does not preclude SARS-Cov-2 infection and should not be used as the sole basis for treatment or other patient management decisions. A negative result may occur with  improper specimen collection/handling, submission of specimen other than nasopharyngeal swab, presence of viral mutation(s) within the areas targeted by this assay, and inadequate number of viral copies(<138 copies/mL). A negative result must be combined with clinical observations, patient history, and epidemiological information. The expected result is Negative.  Fact Sheet for Patients:  BloggerCourse.com  Fact Sheet for Healthcare Providers:  SeriousBroker.it  This test is no t yet approved or cleared by the [place] FDA and  has been authorized for detection and/or diagnosis of SARS-CoV-2 by FDA under an Emergency Use Authorization (EUA). This EUA will remain  in effect (meaning this test can be used) for the duration of the COVID-19 declaration under Section 564(b)(1) of the Act, 21 U.S.C.section 360bbb-3(b)(1), unless the authorization is terminated  or revoked sooner.       Influenza A by PCR NEGATIVE NEGATIVE Final   Influenza B by PCR NEGATIVE NEGATIVE Final    Comment: (NOTE) The Xpert Xpress SARS-CoV-2/FLU/RSV plus assay is intended as an aid in the diagnosis of influenza from Nasopharyngeal swab specimens and should  not be used as a sole basis for treatment. Nasal washings and aspirates are unacceptable for Xpert Xpress SARS-CoV-2/FLU/RSV testing.  Fact Sheet for Patients: BloggerCourse.com  Fact Sheet for Healthcare Providers: SeriousBroker.it  This test is not yet approved or cleared by the [place] FDA and has been  authorized for detection and/or diagnosis of SARS-CoV-2 by FDA under an Emergency Use Authorization (EUA). This EUA will remain in effect (meaning this test can be used) for the duration of the COVID-19 declaration under Section 564(b)(1) of the Act, 21 U.S.C. section 360bbb-3(b)(1), unless the authorization is terminated or revoked.  Performed at [facility], [address] [place], [place] [address]   Culture, blood (Routine x 2)     Status: None (Preliminary result)   Collection Time: 09/16/20  3:26 PM   Specimen: BLOOD  Result Value Ref Range Status   Specimen Description BLOOD BLOOD RIGHT HAND  Final   Special Requests   Final    BOTTLES DRAWN AEROBIC AND ANAEROBIC Blood Culture results may not be optimal due to an inadequate volume of blood received in culture bottles   Culture   Final    NO GROWTH < 24 HOURS Performed at [facility], [address] [place], [place] [address]    Report Status PENDING  Incomplete    Procedures and diagnostic studies:  DG Chest Port 1 View  Result Date: 09/16/2020 CLINICAL DATA:  Bilateral leg infections. EXAM: PORTABLE CHEST 1 VIEW COMPARISON:  Chest x-ray 04/12/2019. FINDINGS: Mediastinum hilar structures normal. Low lung volumes. Mild left base atelectasis/scarring. Mild left base infiltrate cannot be excluded. Stable elevation left hemidiaphragm. No pleural effusion or pneumothorax. IMPRESSION: Mild left base atelectasis/scarring. Mild left base infiltrate cannot be excluded. Stable elevation left hemidiaphragm. Electronically Signed   By: Maisie Fus  Register   On: 09/16/2020 15:31   DG Foot 2 Views Right  Result Date: 09/16/2020 CLINICAL DATA:  ?Osteomyelitis EXAM: RIGHT FOOT - 2 VIEW COMPARISON:  None. FINDINGS: Diffuse osteopenia. There is diffuse soft tissue swelling with possible wound at the lateral hindfoot. There is no evidence of acute fracture. There is mild first MTP degenerative change in additional scattered  interphalangeal joint degenerative change. Vascular calcifications. IMPRESSION: No radiographic evidence of osteomyelitis. MRI would be more sensitive. Diffuse soft tissue swelling with possible wound along the lateral hindfoot. Diffuse osteopenia.  Mild first MTP degenerative arthritis. Electronically Signed   By: Caprice Renshaw   On: 09/16/2020 15:32   ECHOCARDIOGRAM COMPLETE  Result Date: 09/17/2020    ECHOCARDIOGRAM REPORT   Patient Name:   GORAN OLDEN [facility] Date of Exam: 09/16/2020 Medical Rec #:  [id number]         Height:       70.0 in Accession #:    [id number]        Weight:       169.0 lb Date of Birth:  October 27, 1955          BSA:          1.943 m Patient Age:    64 years          BP:           116/72 mmHg Patient Gender: M                 HR:           126 bpm. Exam Location:  [facility] Procedure: 2D Echo, Cardiac Doppler and Color Doppler Indications:     I50.31 Acute Diastolic CHF  History:         Patient has prior history of Echocardiogram examinations, most                  recent 04/13/2019. Risk Factors:Hypertension and Diabetes.                  Peripheral vascular disease.  Sonographer:     S[place] Small Rodgers-Jones Referring Phys:  [id number] Marrion Coy Diagnosing Phys: Arnoldo Hooker MD IMPRESSIONS  1. Left ventricular ejection fraction, by estimation, is 65 to 70%. The left ventricle has normal function. The left ventricle has no regional wall motion abnormalities. Left ventricular diastolic parameters were normal.  2. Right ventricular systolic function is normal. The right ventricular size is normal.  3. The mitral valve is normal in structure. Trivial mitral valve regurgitation.  4. The aortic valve is normal in structure. Aortic valve regurgitation is trivial. FINDINGS  Left Ventricle: Left ventricular ejection fraction, by estimation, is 65 to 70%. The left ventricle has normal function. The left ventricle has no regional wall motion abnormalities. The left ventricular internal cavity size was normal  in size. There is  no left ventricular hypertrophy. Left ventricular diastolic parameters were normal. Right Ventricle: The right ventricular size is normal. No increase in right ventricular wall thickness. Right ventricular systolic function is normal. Left Atrium: Left atrial size was normal in size. Right Atrium: Right atrial size was normal in size. Pericardium: There is no evidence of pericardial effusion. Mitral Valve: The mitral valve is normal in structure. Trivial mitral valve regurgitation. Tricuspid Valve: The tricuspid valve is normal in structure. Tricuspid valve regurgitation is trivial. Aortic Valve: The aortic valve is normal in structure. Aortic valve regurgitation is trivial. Aortic valve mean gradient measures 10.0 mmHg. Aortic valve peak gradient measures 15.4 mmHg. Aortic valve area, by VTI measures 2.17 cm. Pulmonic Valve: The pulmonic valve was normal in structure. Pulmonic valve regurgitation is not visualized. Aorta: The aortic root and ascending aorta are structurally normal, with no evidence of dilitation. IAS/Shunts: No atrial level shunt detected by color flow Doppler.  LEFT VENTRICLE PLAX 2D LVIDd:         3.65 cm  Diastology LVIDs:         2.69 cm  LV e' medial:    10.10 cm/s LV PW:         0.92 cm  LV E/e' medial:  8.3 LV IVS:        1.00 cm  LV e' lateral:   12.30 cm/s LVOT diam:     2.00 cm  LV E/e' lateral: 6.8 LV SV:         55 LV SV Index:   28 LVOT Area:     3.14 cm  RIGHT VENTRICLE RV Basal diam:  3.37 cm RV S prime:     23.60 cm/s TAPSE (M-mode): 2.5 cm LEFT ATRIUM             Index       RIGHT ATRIUM           Index LA diam:        4.10 cm 2.11 cm/m  RA Area:     11.20 cm LA Vol (A2C):   41.7 ml 21.46 ml/m RA Volume:   24.90 ml  12.81 ml/m LA Vol (A4C):   26.7 ml 13.74 ml/m LA Biplane Vol: 35.8 ml 18.42 ml/m  AORTIC VALVE AV Area (Vmax):    1.73 cm AV Area (Vmean):   1.83 cm  AV Area (VTI):     2.17 cm AV Vmax:           196.00 cm/s AV Vmean:          148.000 cm/s  AV VTI:            0.254 m AV Peak Grad:      15.4 mmHg AV Mean Grad:      10.0 mmHg LVOT Vmax:         108.00 cm/s LVOT Vmean:        86.100 cm/s LVOT VTI:          0.176 m LVOT/AV VTI ratio: 0.69  AORTA Ao Root diam: 3.10 cm MITRAL VALVE                TRICUSPID VALVE MV Area (PHT): 3.85 cm     TR Peak grad:   22.3 mmHg MV Decel Time: 197 msec     TR Vmax:        236.00 cm/s MV E velocity: 83.40 cm/s MV A velocity: 107.00 cm/s  SHUNTS MV E/A ratio:  0.78         Systemic VTI:  0.18 m                             Systemic Diam: 2.00 cm Arnoldo Hooker MD Electronically signed by Arnoldo Hooker MD Signature Date/Time: 09/17/2020/12:36:11 PM    Final                LOS: 1 day   Nethra Mehlberg  [facility]   Pager on [email or web address]. If 7PM-7AM, please contact night-coverage at [email or web address]     09/17/2020, 6:28 PM

## 2020-09-17 NOTE — Consult Note (Signed)
WOC Nurse wound consult note Consultation was completed by review of records, images and assistance from the bedside nurse/clinical staff.   Reason for Consult: podietry has written orders for wound care, however this is care the bedside nurses can perform Discussed with Dr. Excell Seltzer on secure chat, updated orders. Staff have already performed wound care per orders received last evening    Wound type: ulceration related to lymphedema  Patient with elephantiasis nostras verrucosa, skin changes associated with chronic lymphedema   Pressure Injury POA: NA Measurement:area covers bilateral LEs from malleolar region to the proximal pretibial and posterior calf  Wound bed: macerated; partial thickness skin loss, distal pretibial region  Drainage (amount, consistency, odor) not documented on nursing flowsheet Periwound: edema associated with lymphedema  Dressing procedure/placement/frequency: AFTER LEG HAVE BEEN SCRUBBED PER ORDERS (BETADINE)  Begin after Dr. Excell Seltzer in to see patient  Use gauze on wounds to remove slough present, apply xeroform to any open wounds, top with 4x4, ABD, kerlix, 4" coban from base of toes to the patellar notch with slight stretch for compression. Bedside nursing staff can provide topical care.  If needed refer to outpatient lymphedema  Clinic  Lymphedema  Resources (updated 2020) Each site requires a referral from your primary care MD [facility] [address] [place], [place]  [phone] (Upper extremities)  [address] [place], [place] [phone] (Lower extremities)  [facility] [address] [place], [place] [address] [phone] [facility] [address] [place], [place] [address] [phone] [facility] [address] Medical Office [address]  [place], [place] [phone]  [facility] [address] [place], [place] [address] [phone]  [facility]  (only treatment for lymphedema related to cancer diagnosis) [address]  [place], [place] [address] [phone]    [facility] [address] [place], [place] [address] [phone]  [facility] [address] [place], [place] [address] [phone] [facility] ([facility]) [address] [place], [place] [address] [phone]      Re consult if needed, will not follow at this time. Thanks  Clydie [employer]en [employer], RN,CWOCN, CNS, CWON-AP [phone])

## 2020-09-17 NOTE — Consult Note (Signed)
PODIATRY / FOOT AND ANKLE SURGERY CONSULTATION NOTE  Requesting Physician: Dr. Myriam ForehandAyiku  Reason for consult: Bilateral foot wounds  Chief Complaint: Bilateral foot wounds, leg swelling   HPI: Brad Frost is a 65 y.o. male who presents with past medical history of diabetes and peripheral vascular disease with complaint of bilateral lower extremity swelling with wounds.  Patient arrived to [facility] yesterday due to this issue where he was subsequently admitted.  Patient was found to have wounds to circumferential aspects of both lower legs and at the right first and second toes.  Patient was also found to have maggots in his wounds.  Patient also has a history of alcohol abuse and did have alcohol in his system when admitted.  Patient presents today resting in bed fairly comfortably but is not very responsive with questions asked.  Patient is in and out of sleeping during today's visit.  Patient has history of revascularization attempts to the right lower extremity including fem pop bypass.  PMHx:  Past Medical History:  Diagnosis Date  . Diabetes mellitus without complication (HCC)   . Hypertension   . Peripheral vascular disease (HCC)     Surgical Hx:  Past Surgical History:  Procedure Laterality Date  . LEG SURGERY  August 20, 2014   Right Leg  BPG    FHx:  Family History  Problem Relation Age of Onset  . Hypertension Mother     Social History:  reports that he has quit smoking. His smoking use included cigarettes. He has never used smokeless tobacco. He reports current alcohol use. He reports that he does not use drugs.  Allergies: No Known Allergies   Medications Prior to Admission  Medication Sig Dispense Refill  . aspirin 81 MG tablet Take 81 mg by mouth daily. (Patient not taking: No sig reported)    . metFORMIN (GLUCOPHAGE) 500 MG tablet Take 500 mg by mouth daily. (Patient not taking: No sig reported)    . metoprolol succinate (TOPROL-XL) 25  MG 24 hr tablet Take 1 tablet (25 mg total) by mouth daily. (Patient not taking: No sig reported)    . rivaroxaban (XARELTO) 20 MG TABS tablet Take 1 tablet (20 mg total) by mouth daily with supper. Starting from January 1 after finishing twice daily dose. (Patient not taking: No sig reported) 30 tablet   . tamsulosin (FLOMAX) 0.4 MG CAPS capsule Take 0.4 mg by mouth daily. (Patient not taking: No sig reported)      Physical Exam: General: Alert and oriented.  No apparent distress.  Vascular: DP/PT pulses nonpalpable bilateral.  Feet do appear to be fairly warm to touch.  No hair growth noted to bilateral lower extremities.  Severe nonpitting edema present to bilateral lower extremities  Neuro: Light touch sensation reduced to bilateral lower extremities  Derm: Lichenification of skin noted about both lower legs circumferentially from below the knee to the ankle area with severe nonpitting edema consistent with lymphedema and venous disease.  Circumferential ulcerations present to bilateral lower extremities around the ankle and lower calf areas.  Maggots are also noted within the wounds.  The wounds appear to be fairly superficial and only go to the level of dermis.  The right foot leg wound appears to be worse than the left.  Wound also present to the distal aspect of the right hallux that appears to be stable at this time but does appear to have dry gangrene present.  No obvious bone exposed at this time,  no erythema, no edema, no signs of infection present, no drainage.  Maggots found throughout the wound area.  Wound present to the dorsal medial aspect of the PIPJ of the right second toe that appears to go to the subcutaneous tissue which measures approximately 0.2 x 0.3 x 0.1 cm, no erythema, no edema, no drainage, no signs of infection present.  MSK: Mild pain on palpation to both legs and feet  Results for orders placed or performed during the hospital encounter of 09/16/20 (from the  past 48 hour(s))  Culture, blood (Routine x 2)     Status: None (Preliminary result)   Collection Time: 09/16/20  2:23 PM   Specimen: BLOOD  Result Value Ref Range   Specimen Description BLOOD BLOOD LEFT FOREARM    Special Requests      BOTTLES DRAWN AEROBIC ONLY Blood Culture results may not be optimal due to an inadequate volume of blood received in culture bottles   Culture      NO GROWTH < 24 HOURS Performed at [facility], [address] [place], [place] [address]    Report Status PENDING   Resp Panel by RT-PCR (Flu A&B, Covid) Nasopharyngeal Swab     Status: None   Collection Time: 09/16/20  2:23 PM   Specimen: Nasopharyngeal Swab; Nasopharyngeal(NP) swabs in vial transport medium  Result Value Ref Range   SARS Coronavirus 2 by RT PCR NEGATIVE NEGATIVE    Comment: (NOTE) SARS-CoV-2 target nucleic acids are NOT DETECTED.  The SARS-CoV-2 RNA is generally detectable in upper respiratory specimens during the acute phase of infection. The lowest concentration of SARS-CoV-2 viral copies this assay can detect is 138 copies/mL. A negative result does not preclude SARS-Cov-2 infection and should not be used as the sole basis for treatment or other patient management decisions. A negative result may occur with  improper specimen collection/handling, submission of specimen other than nasopharyngeal swab, presence of viral mutation(s) within the areas targeted by this assay, and inadequate number of viral copies(<138 copies/mL). A negative result must be combined with clinical observations, patient history, and epidemiological information. The expected result is Negative.  Fact Sheet for Patients:  BloggerCourse.com  Fact Sheet for Healthcare Providers:  SeriousBroker.it  This test is no t yet approved or cleared by the [place] FDA and  has been authorized for detection and/or diagnosis of SARS-CoV-2 by FDA under  an Emergency Use Authorization (EUA). This EUA will remain  in effect (meaning this test can be used) for the duration of the COVID-19 declaration under Section 564(b)(1) of the Act, 21 U.S.C.section 360bbb-3(b)(1), unless the authorization is terminated  or revoked sooner.       Influenza A by PCR NEGATIVE NEGATIVE   Influenza B by PCR NEGATIVE NEGATIVE    Comment: (NOTE) The Xpert Xpress SARS-CoV-2/FLU/RSV plus assay is intended as an aid in the diagnosis of influenza from Nasopharyngeal swab specimens and should not be used as a sole basis for treatment. Nasal washings and aspirates are unacceptable for Xpert Xpress SARS-CoV-2/FLU/RSV testing.  Fact Sheet for Patients: BloggerCourse.com  Fact Sheet for Healthcare Providers: SeriousBroker.it  This test is not yet approved or cleared by the [place] FDA and has been authorized for detection and/or diagnosis of SARS-CoV-2 by FDA under an Emergency Use Authorization (EUA). This EUA will remain in effect (meaning this test can be used) for the duration of the COVID-19 declaration under Section 564(b)(1) of the Act, 21 U.S.C. section 360bbb-3(b)(1), unless the authorization is terminated  or revoked.  Performed at [facility], [address] [place], [place] [address]   Comprehensive metabolic panel     Status: Abnormal   Collection Time: 09/16/20  3:01 PM  Result Value Ref Range   Sodium 129 (L) 135 - 145 mmol/L   Potassium 4.2 3.5 - 5.1 mmol/L   Chloride 100 98 - 111 mmol/L   CO2 18 (L) 22 - 32 mmol/L   Glucose, Bld 82 70 - 99 mg/dL    Comment: Glucose reference range applies only to samples taken after fasting for at least 8 hours.   BUN 18 8 - 23 mg/dL   Creatinine, Ser 7.98 0.61 - 1.24 mg/dL   Calcium 7.9 (L) 8.9 - 10.3 mg/dL   Total Protein 6.7 6.5 - 8.1 g/dL   Albumin 2.1 (L) 3.5 - 5.0 g/dL   AST 17 15 - 41 U/L   ALT 14 0 - 44 U/L   Alkaline  Phosphatase 80 38 - 126 U/L   Total Bilirubin 0.5 0.3 - 1.2 mg/dL   GFR, Estimated >92 >11 mL/min    Comment: (NOTE) Calculated using the CKD-EPI Creatinine Equation (2021)    Anion gap 11 5 - 15    Comment: Performed at [facility], [address] [place], [place] [address]  CBC with Differential     Status: Abnormal   Collection Time: 09/16/20  3:01 PM  Result Value Ref Range   WBC 6.6 4.0 - 10.5 K/uL   RBC 3.76 (L) 4.22 - 5.81 MIL/uL   Hemoglobin 9.0 (L) 13.0 - 17.0 g/dL   HCT 08.1 (L) 44.8 - 18.5 %   MCV 76.6 (L) 80.0 - 100.0 fL   MCH 23.9 (L) 26.0 - 34.0 pg   MCHC 31.3 30.0 - 36.0 g/dL   RDW 63.1 (H) 49.7 - 02.6 %   Platelets 361 150 - 400 K/uL   nRBC 0.0 0.0 - 0.2 %   Neutrophils Relative % 60 %   Neutro Abs 4.0 1.7 - 7.7 K/uL   Lymphocytes Relative 17 %   Lymphs Abs 1.1 0.7 - 4.0 K/uL   Monocytes Relative 19 %   Monocytes Absolute 1.3 (H) 0.1 - 1.0 K/uL   Eosinophils Relative 3 %   Eosinophils Absolute 0.2 0.0 - 0.5 K/uL   Basophils Relative 1 %   Basophils Absolute 0.1 0.0 - 0.1 K/uL   WBC Morphology MORPHOLOGY UNREMARKABLE    RBC Morphology HYPOCHROMASIA    Smear Review Normal platelet morphology    Immature Granulocytes 0 %   Abs Immature Granulocytes 0.02 0.00 - 0.07 K/uL   Ovalocytes PRESENT     Comment: Performed at [facility], [address] [place], [place] [address]  Protime-INR     Status: None   Collection Time: 09/16/20  3:01 PM  Result Value Ref Range   Prothrombin Time 13.6 11.4 - 15.2 seconds   INR 1.0 0.8 - 1.2    Comment: (NOTE) INR goal varies based on device and disease states. Performed at [facility], [address] [place], [place] [address]   Ethanol     Status: Abnormal   Collection Time: 09/16/20  3:01 PM  Result Value Ref Range   Alcohol, Ethyl (B) 21 (H) <10 mg/dL    Comment: (NOTE) Lowest detectable limit for serum alcohol is 10 mg/dL.  For medical purposes only. Performed at [facility], [address] [place], [place] [address]   Brain natriuretic peptide  Status: None   Collection Time: 09/16/20  3:01 PM  Result Value Ref Range   B Natriuretic Peptide 20.2 0.0 - 100.0 pg/mL    Comment: Performed at [facility], [address] [place], [place] [address]  Iron and TIBC     Status: Abnormal   Collection Time: 09/16/20  3:01 PM  Result Value Ref Range   Iron 18 (L) 45 - 182 ug/dL   TIBC 604 540 - 981 ug/dL   Saturation Ratios 7 (L) 17.9 - 39.5 %   UIBC 258 ug/dL    Comment: Performed at [facility], [address] [place], [place] [address]  Vitamin B12     Status: None   Collection Time: 09/16/20  3:01 PM  Result Value Ref Range   Vitamin B-12 275 180 - 914 pg/mL    Comment: (NOTE) This assay is not validated for testing neonatal or myeloproliferative syndrome specimens for Vitamin B12 levels. Performed at [facility], [address], [place], [place] [address]   Lactic acid, plasma     Status: Abnormal   Collection Time: 09/16/20  3:26 PM  Result Value Ref Range   Lactic Acid, Venous 2.0 (HH) 0.5 - 1.9 mmol/L    Comment: CRITICAL RESULT CALLED TO, READ BACK BY AND VERIFIED WITH AMY COYNE  09/16/20 MJU Performed at [facility], [address] [place], [place] [address]   Culture, blood (Routine x 2)     Status: None (Preliminary result)   Collection Time: 09/16/20  3:26 PM   Specimen: BLOOD  Result Value Ref Range   Specimen Description BLOOD BLOOD RIGHT HAND    Special Requests      BOTTLES DRAWN AEROBIC AND ANAEROBIC Blood Culture results may not be optimal due to an inadequate volume of blood received in culture bottles   Culture      NO GROWTH < 24 HOURS Performed at [facility], [address] [place], [place] [address]    Report Status PENDING   Lactic acid, plasma     Status: None   Collection Time: 09/16/20  6:24 PM  Result Value Ref Range   Lactic Acid, Venous 1.6  0.5 - 1.9 mmol/L    Comment: Performed at [facility], [address] [place], [place] [address]  Glucose, capillary     Status: None   Collection Time: 09/16/20  9:53 PM  Result Value Ref Range   Glucose-Capillary 96 70 - 99 mg/dL    Comment: Glucose reference range applies only to samples taken after fasting for at least 8 hours.   Comment 1 Notify RN   Basic metabolic panel     Status: Abnormal   Collection Time: 09/17/20  7:22 AM  Result Value Ref Range   Sodium 131 (L) 135 - 145 mmol/L   Potassium 4.5 3.5 - 5.1 mmol/L   Chloride 103 98 - 111 mmol/L   CO2 20 (L) 22 - 32 mmol/L   Glucose, Bld 78 70 - 99 mg/dL    Comment: Glucose reference range applies only to samples taken after fasting for at least 8 hours.   BUN 14 8 - 23 mg/dL   Creatinine, Ser 5.28 0.61 - 1.24 mg/dL   Calcium 7.9 (L) 8.9 - 10.3 mg/dL   GFR, Estimated >41 >32 mL/min    Comment: (NOTE) Calculated using the CKD-EPI Creatinine Equation (2021)    Anion gap 8 5 - 15    Comment: Performed at [facility], [address] [place], [place] [address]  CBC     Status: Abnormal   Collection Time: 09/17/20  7:22 AM  Result Value Ref Range   WBC 9.6 4.0 - 10.5 K/uL   RBC 3.63 (L) 4.22 - 5.81 MIL/uL   Hemoglobin 8.7 (L) 13.0 - 17.0 g/dL    Comment: Reticulocyte Hemoglobin testing may be clinically indicated, consider ordering this additional test [id number]    HCT 26.9 (L) 39.0 - 52.0 %   MCV 74.1 (L) 80.0 - 100.0 fL   MCH 24.0 (L) 26.0 - 34.0 pg   MCHC 32.3 30.0 - 36.0 g/dL   RDW 11.9 (H) 14.7 - 82.9 %   Platelets 372 150 - 400 K/uL   nRBC 0.0 0.0 - 0.2 %    Comment: Performed at [facility], [address] [place], [place] [address]  HIV Antibody (routine testing w rflx)     Status: None   Collection Time: 09/17/20  7:22 AM  Result Value Ref Range   HIV Screen 4th Generation wRfx Non Reactive Non Reactive    Comment: Performed at [facility], [address]  [place], [place] [address]  Glucose, capillary     Status: None   Collection Time: 09/17/20  7:37 AM  Result Value Ref Range   Glucose-Capillary 73 70 - 99 mg/dL    Comment: Glucose reference range applies only to samples taken after fasting for at least 8 hours.  Glucose, capillary     Status: None   Collection Time: 09/17/20 11:22 AM  Result Value Ref Range   Glucose-Capillary 87 70 - 99 mg/dL    Comment: Glucose reference range applies only to samples taken after fasting for at least 8 hours.   DG Chest Port 1 View  Result Date: 09/16/2020 CLINICAL DATA:  Bilateral leg infections. EXAM: PORTABLE CHEST 1 VIEW COMPARISON:  Chest x-ray 04/12/2019. FINDINGS: Mediastinum hilar structures normal. Low lung volumes. Mild left base atelectasis/scarring. Mild left base infiltrate cannot be excluded. Stable elevation left hemidiaphragm. No pleural effusion or pneumothorax. IMPRESSION: Mild left base atelectasis/scarring. Mild left base infiltrate cannot be excluded. Stable elevation left hemidiaphragm. Electronically Signed   By: Maisie Fus  Register   On: 09/16/2020 15:31   DG Foot 2 Views Right  Result Date: 09/16/2020 CLINICAL DATA:  ?Osteomyelitis EXAM: RIGHT FOOT - 2 VIEW COMPARISON:  None. FINDINGS: Diffuse osteopenia. There is diffuse soft tissue swelling with possible wound at the lateral hindfoot. There is no evidence of acute fracture. There is mild first MTP degenerative change in additional scattered interphalangeal joint degenerative change. Vascular calcifications. IMPRESSION: No radiographic evidence of osteomyelitis. MRI would be more sensitive. Diffuse soft tissue swelling with possible wound along the lateral hindfoot. Diffuse osteopenia.  Mild first MTP degenerative arthritis. Electronically Signed   By: Caprice Renshaw   On: 09/16/2020 15:32   ECHOCARDIOGRAM COMPLETE  Result Date: 09/17/2020    ECHOCARDIOGRAM REPORT   Patient Name:   Brad Frost [facility] Date of Exam: 09/16/2020 Medical Rec #:   [id number]         Height:       70.0 in Accession #:    [id number]        Weight:       169.0 lb Date of Birth:  [birth year]          BSA:          1.943 m Patient Age:    81 years  BP:           116/72 mmHg Patient Gender: M                 HR:           126 bpm. Exam Location:  [facility] Procedure: 2D Echo, Cardiac Doppler and Color Doppler Indications:     I50.31 Acute Diastolic CHF  History:         Patient has prior history of Echocardiogram examinations, most                  recent 04/13/2019. Risk Factors:Hypertension and Diabetes.                  Peripheral vascular disease.  Sonographer:     Sedonia Small Rodgers-Jones Referring Phys:  [id number] Marrion Coy Diagnosing Phys: Arnoldo Hooker MD IMPRESSIONS  1. Left ventricular ejection fraction, by estimation, is 65 to 70%. The left ventricle has normal function. The left ventricle has no regional wall motion abnormalities. Left ventricular diastolic parameters were normal.  2. Right ventricular systolic function is normal. The right ventricular size is normal.  3. The mitral valve is normal in structure. Trivial mitral valve regurgitation.  4. The aortic valve is normal in structure. Aortic valve regurgitation is trivial. FINDINGS  Left Ventricle: Left ventricular ejection fraction, by estimation, is 65 to 70%. The left ventricle has normal function. The left ventricle has no regional wall motion abnormalities. The left ventricular internal cavity size was normal in size. There is  no left ventricular hypertrophy. Left ventricular diastolic parameters were normal. Right Ventricle: The right ventricular size is normal. No increase in right ventricular wall thickness. Right ventricular systolic function is normal. Left Atrium: Left atrial size was normal in size. Right Atrium: Right atrial size was normal in size. Pericardium: There is no evidence of pericardial effusion. Mitral Valve: The mitral valve is normal in structure. Trivial mitral valve regurgitation.  Tricuspid Valve: The tricuspid valve is normal in structure. Tricuspid valve regurgitation is trivial. Aortic Valve: The aortic valve is normal in structure. Aortic valve regurgitation is trivial. Aortic valve mean gradient measures 10.0 mmHg. Aortic valve peak gradient measures 15.4 mmHg. Aortic valve area, by VTI measures 2.17 cm. Pulmonic Valve: The pulmonic valve was normal in structure. Pulmonic valve regurgitation is not visualized. Aorta: The aortic root and ascending aorta are structurally normal, with no evidence of dilitation. IAS/Shunts: No atrial level shunt detected by color flow Doppler.  LEFT VENTRICLE PLAX 2D LVIDd:         3.65 cm  Diastology LVIDs:         2.69 cm  LV e' medial:    10.10 cm/s LV PW:         0.92 cm  LV E/e' medial:  8.3 LV IVS:        1.00 cm  LV e' lateral:   12.30 cm/s LVOT diam:     2.00 cm  LV E/e' lateral: 6.8 LV SV:         55 LV SV Index:   28 LVOT Area:     3.14 cm  RIGHT VENTRICLE RV Basal diam:  3.37 cm RV S prime:     23.60 cm/s TAPSE (M-mode): 2.5 cm LEFT ATRIUM             Index       RIGHT ATRIUM           Index LA diam:  4.10 cm 2.11 cm/m  RA Area:     11.20 cm LA Vol (A2C):   41.7 ml 21.46 ml/m RA Volume:   24.90 ml  12.81 ml/m LA Vol (A4C):   26.7 ml 13.74 ml/m LA Biplane Vol: 35.8 ml 18.42 ml/m  AORTIC VALVE AV Area (Vmax):    1.73 cm AV Area (Vmean):   1.83 cm AV Area (VTI):     2.17 cm AV Vmax:           196.00 cm/s AV Vmean:          148.000 cm/s AV VTI:            0.254 m AV Peak Grad:      15.4 mmHg AV Mean Grad:      10.0 mmHg LVOT Vmax:         108.00 cm/s LVOT Vmean:        86.100 cm/s LVOT VTI:          0.176 m LVOT/AV VTI ratio: 0.69  AORTA Ao Root diam: 3.10 cm MITRAL VALVE                TRICUSPID VALVE MV Area (PHT): 3.85 cm     TR Peak grad:   22.3 mmHg MV Decel Time: 197 msec     TR Vmax:        236.00 cm/s MV E velocity: 83.40 cm/s MV A velocity: 107.00 cm/s  SHUNTS MV E/A ratio:  0.78         Systemic VTI:  0.18 m                              Systemic Diam: 2.00 cm Arnoldo Hooker MD Electronically signed by Arnoldo Hooker MD Signature Date/Time: 09/17/2020/12:36:11 PM    Final     Blood pressure 103/63, pulse (!) 108, temperature 100 F (37.8 C), temperature source Oral, resp. rate 16, height 5\' 10"  (1.778 m), weight 76.7 kg, SpO2 (!) 82 %.  Assessment 1. 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 Dorsal R 2nd toe ulceration 3. Circumferential venous/lymphedema wounds to lower legs 4. DM2 with polyneuropathy 5. EtOH Abuse 6. Maggot infestation  Plan -Pt seen and evaluated. -Wounds cleansed to bilateral lower extremities with gauze removing all fibrous slough and maggots present to all wounds of both feet.  Believe that most if not all maggots removed with cleansing today.  Also cleanse wounds with Betadine gauze.  Applied Betadine gauze circumferentially to the wounds and to the right toe wounds.  Applied 4 x 4's, ABD, Kerlix, Ace wrap with moderate compression. -Dressing orders placed with wound care.  Recommend dressing changes daily for now but if has saturation or strikethrough then may need to change it to twice daily.  Would recommend instead using Betadine if available would recommend usage of silver alginate or Xeroform depending on the amount of drainage present or availability of product. -Right hallux ulceration appears to be stable at this time but does appear to have dry gangrene present -Vascular surgery has been consulted.  Appreciate recommendations. -No surgical intervention is indicated at this time as wounds to both legs and the right foot appears to be stable. -Recommend continue dressing changes daily.  Patient needs to follow-up in the wound care center as well for these and would potentially benefit from [facility] boot placements in the future or multilayer compression of dressings.  This is difficult situation as  the patient does have peripheral vascular disease as well which  complicates the amount of compression that can be applied. -We will continue to follow-up with patient peripherally while in the hospital but can follow-up on an outpatient basis once again with wound care center.  No further treatment indicated at this time. -Appreciate recommendations for antibiotic therapy.  Would recommend discharge on 7 days oral antibiotics with broad-spectrum coverage.  Rosetta Posner DPM 09/17/2020, 12:53 PM

## 2020-09-18 DIAGNOSIS — E1140 Type 2 diabetes mellitus with diabetic neuropathy, unspecified: Secondary | ICD-10-CM | POA: Diagnosis not present

## 2020-09-18 DIAGNOSIS — L97511 Non-pressure chronic ulcer of other part of right foot limited to breakdown of skin: Secondary | ICD-10-CM | POA: Diagnosis not present

## 2020-09-18 DIAGNOSIS — I739 Peripheral vascular disease, unspecified: Secondary | ICD-10-CM | POA: Diagnosis not present

## 2020-09-18 LAB — CBC WITH DIFFERENTIAL/PLATELET
Abs Immature Granulocytes: 0.04 10*3/uL (ref 0.00–0.07)
Basophils Absolute: 0 10*3/uL (ref 0.0–0.1)
Basophils Relative: 0 %
Eosinophils Absolute: 0.2 10*3/uL (ref 0.0–0.5)
Eosinophils Relative: 2 %
HCT: 26.2 % — ABNORMAL LOW (ref 39.0–52.0)
Hemoglobin: 8.3 g/dL — ABNORMAL LOW (ref 13.0–17.0)
Immature Granulocytes: 0 %
Lymphocytes Relative: 12 %
Lymphs Abs: 1.3 10*3/uL (ref 0.7–4.0)
MCH: 23.8 pg — ABNORMAL LOW (ref 26.0–34.0)
MCHC: 31.7 g/dL (ref 30.0–36.0)
MCV: 75.1 fL — ABNORMAL LOW (ref 80.0–100.0)
Monocytes Absolute: 1.6 10*3/uL — ABNORMAL HIGH (ref 0.1–1.0)
Monocytes Relative: 14 %
Neutro Abs: 7.7 10*3/uL (ref 1.7–7.7)
Neutrophils Relative %: 72 %
Platelets: 377 10*3/uL (ref 150–400)
RBC: 3.49 MIL/uL — ABNORMAL LOW (ref 4.22–5.81)
RDW: 21 % — ABNORMAL HIGH (ref 11.5–15.5)
WBC: 10.9 10*3/uL — ABNORMAL HIGH (ref 4.0–10.5)
nRBC: 0 % (ref 0.0–0.2)

## 2020-09-18 LAB — GLUCOSE, CAPILLARY
Glucose-Capillary: 100 mg/dL — ABNORMAL HIGH (ref 70–99)
Glucose-Capillary: 83 mg/dL (ref 70–99)
Glucose-Capillary: 95 mg/dL (ref 70–99)
Glucose-Capillary: 110 mg/dL — ABNORMAL HIGH (ref 70–99)
Glucose-Capillary: 98 mg/dL (ref 70–99)

## 2020-09-18 LAB — COMPREHENSIVE METABOLIC PANEL
ALT: 20 U/L (ref 0–44)
AST: 41 U/L (ref 15–41)
Albumin: 1.7 g/dL — ABNORMAL LOW (ref 3.5–5.0)
Alkaline Phosphatase: 74 U/L (ref 38–126)
Anion gap: 9 (ref 5–15)
BUN: 10 mg/dL (ref 8–23)
CO2: 20 mmol/L — ABNORMAL LOW (ref 22–32)
Calcium: 7.8 mg/dL — ABNORMAL LOW (ref 8.9–10.3)
Chloride: 105 mmol/L (ref 98–111)
Creatinine, Ser: 0.92 mg/dL (ref 0.61–1.24)
GFR, Estimated: >60 mL/min (ref >60)
Glucose, Bld: 109 mg/dL — ABNORMAL HIGH (ref 70–99)
Potassium: 3.9 mmol/L (ref 3.5–5.1)
Sodium: 134 mmol/L — ABNORMAL LOW (ref 135–145)
Total Bilirubin: 1.3 mg/dL — ABNORMAL HIGH (ref 0.3–1.2)
Total Protein: 5.9 g/dL — ABNORMAL LOW (ref 6.5–8.1)

## 2020-09-18 LAB — PROTIME-INR
INR: 3.1 — ABNORMAL HIGH (ref 0.8–1.2)
Prothrombin Time: 31.5 seconds — ABNORMAL HIGH (ref 11.4–15.2)

## 2020-09-18 LAB — APTT: aPTT: 55 seconds — ABNORMAL HIGH (ref 24–36)

## 2020-09-18 LAB — LACTIC ACID, PLASMA
Lactic Acid, Venous: 0.8 mmol/L (ref 0.5–1.9)
Lactic Acid, Venous: 0.9 mmol/L (ref 0.5–1.9)

## 2020-09-18 LAB — MRSA PCR SCREENING: MRSA by PCR: NEGATIVE

## 2020-09-18 MED ORDER — SODIUM CHLORIDE 0.9 % IV BOLUS (SEPSIS)
1000.0000 mL | Freq: Once | INTRAVENOUS | Status: AC
  Administered 2020-09-18: 1000 mL via INTRAVENOUS

## 2020-09-18 MED ORDER — CEFAZOLIN SODIUM-DEXTROSE 2-4 GM/100ML-% IV SOLN
2.0000 g | INTRAVENOUS | Status: AC
  Administered 2020-09-19: 2 g via INTRAVENOUS
  Filled 2020-09-18 (×2): qty 100

## 2020-09-18 MED ORDER — SULFAMETHOXAZOLE-TRIMETHOPRIM 800-160 MG PO TABS
1.0000 | ORAL_TABLET | Freq: Two times a day (BID) | ORAL | Status: DC
  Administered 2020-09-18 – 2020-09-19 (×2): 1 via ORAL
  Filled 2020-09-18 (×2): qty 1

## 2020-09-18 MED ORDER — CEFTRIAXONE SODIUM 2 G IJ SOLR
2.0000 g | INTRAMUSCULAR | Status: DC
  Administered 2020-09-18 – 2020-09-19 (×2): 2 g via INTRAVENOUS
  Filled 2020-09-18: qty 2
  Filled 2020-09-18 (×2): qty 20

## 2020-09-18 MED ORDER — SODIUM CHLORIDE 0.9 % IV SOLN: INTRAVENOUS | Status: DC

## 2020-09-18 NOTE — Progress Notes (Addendum)
Cross coverage note  Patient with mews yellow, febrile at 100.6 and tachycardic to 126.  Records reviewed.  Patient was taken off IV antibiotics placed on Bactrim with anticipation of discharge in the a.m.  Code sepsis called at this time and we started patient on ceftriaxone pending lab results.  10:51pm Vitals improved with 1 L fluid bolus, now afebrile and heart rate 119. WBC increased to 10.9 from 9.6 the day prior and 6.62 days prior.  Lactic acid normal at 0.8  Cellulitis, unlikely sepsis - Continue current antibiotics of Bactrim - Rocephin was started  - Continue close monitoring

## 2020-09-18 NOTE — Evaluation (Signed)
Physical Therapy Evaluation Patient Details Name: Brad Frost MRN: [id number] DOB: 1956/03/29 Today's Date: 09/18/2020   History of Present Illness  Per MD notes: Pt is a 65 y.o. male with a history of DM, essential hypertension, peripheral vascular disease and chronic alcohol drinking who present to the hospital complaining of bilateral leg swelling and pain.  Patient also has chronic leg edema appears to be lymphedema. 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    Clinical Impression  Pt was pleasant and motivated to participate during the session.  Pt put forth good effort during the session but ultimately required extensive physical assistance with all functional tasks.  Pt was able to come to standing with extensive assistance but remained in a flexed trunk position and was unable to advance either LE during amb attempts.  Pt would not be safe to return to his prior living situation and will benefit from PT services in a SNF setting upon discharge to safely address deficits listed in patient problem list for decreased caregiver assistance and eventual return to PLOF.      Follow Up Recommendations SNF;Supervision/Assistance - 24 hour    Equipment Recommendations  Other (comment) (TBD at next venue of care)    Recommendations for Other Services       Precautions / Restrictions Precautions Precautions: Fall Restrictions Weight Bearing Restrictions: Yes RLE Weight Bearing: Weight bearing as tolerated LLE Weight Bearing: Weight bearing as tolerated Other Position/Activity Restrictions: Bilateral post op shoes donned with WB activity      Mobility  Bed Mobility Overal bed mobility: Needs Assistance Bed Mobility: Supine to Sit;Sit to Supine      Supine to sit: Mod assist Sit to supine: Mod assist   General bed mobility comments: Mod A for BLE and trunk control    Transfers Overall transfer level: Needs assistance Equipment used: Rolling walker (2 wheeled) Transfers: Sit to/from Stand Sit to Stand: Max assist;From elevated surface         General transfer comment: Max verbal and tactile cues for sequencing with heavy assist to needed to stand from an elevated surface  Ambulation/Gait             General Gait Details: Pt unable to advance either LE  Stairs            Wheelchair Mobility    Modified Rankin (Stroke Patients Only)       Balance Overall balance assessment: Needs assistance   Sitting balance-Leahy Scale: Good     Standing balance support: Bilateral upper extremity supported Standing balance-Leahy Scale: Poor Standing balance comment: Min to mod A to maintain static standing balance with a RW                             Pertinent Vitals/Pain Pain Assessment: 0-10 Pain Score: 7  Pain Location: B feet Pain Descriptors / Indicators: Sore Pain Intervention(s): Premedicated before session;Monitored during session    Home Living Family/patient expects to be discharged to:: Private residence Living Arrangements: Alone Available Help at Discharge: Family;Available PRN/intermittently Type of Home: Apartment Home Access: Stairs to enter Entrance Stairs-Rails: Right Entrance Stairs-Number of Steps: 4 Home Layout: One level Home Equipment: Krystal Clark - 2 wheels      Prior Function Level of Independence: Independent  with assistive device(s)         Comments: Mod Ind amb with a QC community distances, Ind with ADLs, no fall history     Hand Dominance        Extremity/Trunk Assessment   Upper Extremity Assessment Upper Extremity Assessment: Generalized weakness    Lower Extremity Assessment Lower Extremity Assessment: Generalized weakness;RLE  deficits/detail;LLE deficits/detail RLE Deficits / Details: Lacking grossly 20 deg knee ext LLE Deficits / Details: Lacking grossly 10 deg knee ext       Communication   Communication: No difficulties  Cognition Arousal/Alertness: Awake/alert Behavior During Therapy: WFL for tasks assessed/performed Overall Cognitive Status: Within Functional Limits for tasks assessed                                        General Comments      Exercises Total Joint Exercises Ankle Circles/Pumps: AROM;Both;10 reps Quad Sets: Strengthening;AROM;Both;10 reps Gluteal Sets: Strengthening;Both;10 reps Hip ABduction/ADduction: AAROM;Strengthening;Both;10 reps Straight Leg Raises: 10 reps;Both;Strengthening;AAROM Long Arc Quad: AROM;Strengthening;Both;10 reps Other Exercises Other Exercises: Positioning education to promote B knee ext PROM Other Exercises: HEP education for BLE APs, QS, and GS x 10 every 1-2 hours daily   Assessment/Plan    PT Assessment Patient needs continued PT services  PT Problem List Decreased strength;Decreased range of motion;Decreased activity tolerance;Decreased balance;Decreased mobility;Decreased knowledge of use of DME       PT Treatment Interventions DME instruction;Gait training;Stair training;Functional mobility training;Therapeutic activities;Therapeutic exercise;Balance training;Patient/family education    PT Goals (Current goals can be found in the Care Plan section)  Acute Rehab PT Goals Patient Stated Goal: To get stronger PT Goal Formulation: With patient Time For Goal Achievement: 10/01/20 Potential to Achieve Goals: Good    Frequency Min 2X/week   Barriers to discharge Inaccessible home environment;Decreased caregiver support      Co-evaluation               AM-PAC PT "6 Clicks" Mobility  Outcome Measure Help needed turning from your back to your side while in a flat bed without using bedrails?: A Lot Help needed moving  from lying on your back to sitting on the side of a flat bed without using bedrails?: A Lot Help needed moving to and from a bed to a chair (including a wheelchair)?: A Lot Help needed standing up from a chair using your arms (e.g., wheelchair or bedside chair)?: A Lot Help needed to walk in hospital room?: Total Help needed climbing 3-5 steps with a railing? : Total 6 Click Score: 10    End of Session Equipment Utilized During Treatment: Gait belt Activity Tolerance: Patient tolerated treatment well Patient left: in bed;with call bell/phone within reach;with bed alarm set Nurse Communication: Mobility status;Weight bearing status;Other (comment) (post-op shoes with WB) PT Visit Diagnosis: Unsteadiness on feet (R26.81);Difficulty in walking, not elsewhere classified (R26.2);Muscle weakness (generalized) (M62.81)    Time: 2878-6767 PT Time Calculation (min) (ACUTE ONLY): 34 min   Charges:   PT Evaluation $PT Eval Moderate Complexity: 1 Mod PT Treatments $Therapeutic Exercise: 8-22 mins        D. Scott Daniele Yankowski PT, DPT 09/18/20, 1:23 PM

## 2020-09-18 NOTE — Progress Notes (Signed)
Progress Note    Brad Frost  GUR:[id number] DOB: [birth year]  DOA: 09/16/2020 PCP: [name], [name], MD      Brief Narrative:    Medical records reviewed and are as summarized below:  Brad Frost is a 65 y.o. male       Assessment/Plan:   Active Problems:   PVD (peripheral vascular disease) (HCC)   Type 2 diabetes mellitus with diabetic neuropathy, without long-term current use of insulin (HCC)   Cellulitis of lower extremity   Toe ulcer, right (HCC)   Body mass index is 24.25 kg/m.   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  Vascular surgery recommended angiography tomorrow.  Outpatient follow-up with podiatrist. Bilateral leg cellulitis is an unlikely diagnosis at this time.  Type II DM with peripheral neuropathy: NovoLog as needed for hyperglycemia  Alcohol use disorder with alcohol withdrawal syndrome.: Ativan as needed.  CIWA protocol  Sinus tachycardia: Continue metoprolol.  Lactic acidosis: Improved.  Probably from metformin.  History of DVT: Continue Xarelto  BNP was normal.  2D echo was unremarkable.  No evidence of CHF at this time.   Diet Order            Diet heart healthy/carb modified Room service appropriate? Yes; Fluid consistency: Thin  Diet effective now                    Consultants:  Podiatrist  Procedures:  None    Medications:   . aspirin  81 mg Oral Daily  . insulin aspart  0-9 Units Subcutaneous TID WC  . LORazepam  0-4 mg Intravenous Q12H   Or  . LORazepam  0-4 mg Oral Q12H  . metoprolol succinate  25 mg Oral Daily  . rivaroxaban  20 mg Oral Q supper  . sodium chloride flush  3 mL Intravenous Q12H  . tamsulosin  0.4 mg Oral Daily  . thiamine  100 mg Oral Daily   Or   . thiamine  100 mg Intravenous Daily   Continuous Infusions: . sodium chloride 10 mL/hr at 09/17/20 0300  . cefTRIAXone (ROCEPHIN)  IV 1 g (09/17/20 1815)  . vancomycin 1,000 mg (09/18/20 0935)     Anti-infectives (From admission, onward)   Start     Dose/Rate Route Frequency Ordered Stop   09/17/20 2000  vancomycin (VANCOREADY) IVPB 1000 mg/200 mL        1,000 mg 200 mL/hr over 60 Minutes Intravenous Every 12 hours 09/17/20 1027     09/17/20 0800  vancomycin (VANCOCIN) IVPB 1000 mg/200 mL premix  Status:  Discontinued        1,000 mg 200 mL/hr over 60 Minutes Intravenous Every 12 hours 09/16/20 1723 09/17/20 1027   09/16/20 1800  cefTRIAXone (ROCEPHIN) 1 g in sodium chloride 0.9 % 100 mL IVPB        1 g 200 mL/hr over 30 Minutes Intravenous Every 24 hours 09/16/20 1648     09/16/20 1730  vancomycin (VANCOREADY) IVPB 750 mg/150 mL        750 mg 150 mL/hr over 60 Minutes Intravenous  Once 09/16/20 1723 09/16/20 2220   09/16/20 1445  ceFEPIme (MAXIPIME) 2 g in sodium chloride 0.9 % 100 mL IVPB        2 g  200 mL/hr over 30 Minutes Intravenous  Once 09/16/20 1442 09/16/20 1553   09/16/20 1445  vancomycin (VANCOCIN) IVPB 1000 mg/200 mL premix        1,000 mg 200 mL/hr over 60 Minutes Intravenous  Once 09/16/20 1442 09/16/20 1723             Family Communication/Anticipated D/C date and plan/Code Status   DVT prophylaxis:  rivaroxaban (XARELTO) tablet 20 mg     Code Status: Full Code  Family Communication: None Disposition Plan:    Status is: Inpatient  Remains inpatient appropriate because:IV treatments appropriate due to intensity of illness or inability to take PO and Inpatient level of care appropriate due to severity of illness   Dispo: The patient is from: Home              Anticipated d/c is to: Home              Patient currently is not medically stable to d/c.   Difficult to place patient No           Subjective:   Interval events noted.   Pain in the legs is a little better today.  Objective:    Vitals:   09/17/20 2357 09/18/20 0340 09/18/20 0752 09/18/20 1200  BP: 101/67 110/79 109/80 107/72  Pulse: (!) 109 (!) 109 (!) 110 (!) 119  Resp: 16 16 16    Temp: 98.5 F (36.9 C) 97.7 F (36.5 C) 98 F (36.7 C)   TempSrc: Oral Oral Oral   SpO2: 98% 98% 99%   Weight:      Height:       No data found.   Intake/Output Summary (Last 24 hours) at 09/18/2020 1546 Last data filed at 09/18/2020 0656 Gross per 24 hour  Intake 721.18 ml  Output 1300 ml  Net -578.82 ml   Filed Weights   09/16/20 1424  Weight: 76.7 kg    Exam:  GEN: NAD SKIN: Warm and dry EYES: No pallor or icterus ENT: MMM CV: RRR PULM: CTA B ABD: soft, ND, NT, +BS CNS: AAO x 3, non focal EXT: Bilateral legs have been wrapped.         Data Reviewed:   I have personally reviewed following labs and imaging studies:  Labs: Labs show the following:   Basic Metabolic Panel: Recent Labs  Lab 09/16/20 1501 09/17/20 0722  NA 129* 131*  K 4.2 4.5  CL 100 103  CO2 18* 20*  GLUCOSE 82 78  BUN 18 14  CREATININE 1.03 0.82  CALCIUM 7.9* 7.9*   GFR Estimated Creatinine Clearance: 91.5 mL/min (by C-G formula based on SCr of 0.82 mg/dL). Liver Function Tests: Recent Labs  Lab 09/16/20 1501  AST 17  ALT 14  ALKPHOS 80  BILITOT 0.5  PROT 6.7  ALBUMIN 2.1*   No results for input(s): LIPASE, AMYLASE in the last 168 hours. No results for input(s): AMMONIA in the last 168 hours. Coagulation profile Recent Labs  Lab 09/16/20 1501  INR 1.0    CBC: Recent Labs  Lab 09/16/20 1501 09/17/20 0722  WBC 6.6 9.6  NEUTROABS 4.0  --   HGB 9.0* 8.7*  HCT 28.8* 26.9*  MCV 76.6* 74.1*  PLT 361 372   Cardiac Enzymes: No results for input(s): CKTOTAL, CKMB, CKMBINDEX, TROPONINI in the last 168 hours. BNP (last 3 results) No results for input(s): PROBNP in the last 8760 hours. CBG: Recent Labs  Lab 09/17/20 1635 09/17/20 2106  09/18/20 0349  09/18/20 0753 09/18/20 1138  GLUCAP 77 102* 100* 83 95   D-Dimer: No results for input(s): DDIMER in the last 72 hours. Hgb A1c: Recent Labs    09/16/20 1824  HGBA1C 5.7*   Lipid Profile: No results for input(s): CHOL, HDL, LDLCALC, TRIG, CHOLHDL, LDLDIRECT in the last 72 hours. Thyroid function studies: No results for input(s): TSH, T4TOTAL, T3FREE, THYROIDAB in the last 72 hours.  Invalid input(s): FREET3 Anemia work up: Recent Labs    09/16/20 1501  VITAMINB12 275  TIBC 276  IRON 18*   Sepsis Labs: Recent Labs  Lab 09/16/20 1501 09/16/20 1526 09/16/20 1824 09/17/20 0722  WBC 6.6  --   --  9.6  LATICACIDVEN  --  2.0* 1.6  --     Microbiology Recent Results (from the past 240 hour(s))  Culture, blood (Routine x 2)     Status: None (Preliminary result)   Collection Time: 09/16/20  2:23 PM   Specimen: BLOOD  Result Value Ref Range Status   Specimen Description BLOOD BLOOD LEFT FOREARM  Final   Special Requests   Final    BOTTLES DRAWN AEROBIC ONLY Blood Culture results may not be optimal due to an inadequate volume of blood received in culture bottles   Culture   Final    NO GROWTH < 24 HOURS Performed at [facility], [address] [place], [place] [address]    Report Status PENDING  Incomplete  Resp Panel by RT-PCR (Flu A&B, Covid) Nasopharyngeal Swab     Status: None   Collection Time: 09/16/20  2:23 PM   Specimen: Nasopharyngeal Swab; Nasopharyngeal(NP) swabs in vial transport medium  Result Value Ref Range Status   SARS Coronavirus 2 by RT PCR NEGATIVE NEGATIVE Final    Comment: (NOTE) SARS-CoV-2 target nucleic acids are NOT DETECTED.  The SARS-CoV-2 RNA is generally detectable in upper respiratory specimens during the acute phase of infection. The lowest concentration of SARS-CoV-2 viral copies this assay can detect is 138 copies/mL. A negative result does not preclude SARS-Cov-2 infection and should not be used as the  sole basis for treatment or other patient management decisions. A negative result may occur with  improper specimen collection/handling, submission of specimen other than nasopharyngeal swab, presence of viral mutation(s) within the areas targeted by this assay, and inadequate number of viral copies(<138 copies/mL). A negative result must be combined with clinical observations, patient history, and epidemiological information. The expected result is Negative.  Fact Sheet for Patients:  BloggerCourse.comhttps://www.fda.gov/media/152166/download  Fact Sheet for Healthcare Providers:  SeriousBroker.ithttps://www.fda.gov/media/152162/download  This test is no t yet approved or cleared by the [place] FDA and  has been authorized for detection and/or diagnosis of SARS-CoV-2 by FDA under an Emergency Use Authorization (EUA). This EUA will remain  in effect (meaning this test can be used) for the duration of the COVID-19 declaration under Section 564(b)(1) of the Act, 21 U.S.C.section 360bbb-3(b)(1), unless the authorization is terminated  or revoked sooner.       Influenza A by PCR NEGATIVE NEGATIVE Final   Influenza B by PCR NEGATIVE NEGATIVE Final    Comment: (NOTE) The Xpert Xpress SARS-CoV-2/FLU/RSV plus assay is intended as an aid in the diagnosis of influenza from Nasopharyngeal swab specimens and should not be used as a sole basis for treatment. Nasal washings and aspirates are unacceptable for Xpert Xpress SARS-CoV-2/FLU/RSV testing.  Fact Sheet for Patients: BloggerCourse.comhttps://www.fda.gov/media/152166/download  Fact Sheet for Healthcare Providers: SeriousBroker.ithttps://www.fda.gov/media/152162/download  This test is not yet approved or  cleared by the [place] and has been authorized for detection and/or diagnosis of SARS-CoV-2 by FDA under an Emergency Use Authorization (EUA). This EUA will remain in effect (meaning this test can be used) for the duration of the COVID-19 declaration under Section 564(b)(1) of the  Act, 21 U.S.C. section 360bbb-3(b)(1), unless the authorization is terminated or revoked.  Performed at [facility], [address] [place], [place] [address]   Culture, blood (Routine x 2)     Status: None (Preliminary result)   Collection Time: 09/16/20  3:26 PM   Specimen: BLOOD  Result Value Ref Range Status   Specimen Description BLOOD BLOOD RIGHT HAND  Final   Special Requests   Final    BOTTLES DRAWN AEROBIC AND ANAEROBIC Blood Culture results may not be optimal due to an inadequate volume of blood received in culture bottles   Culture   Final    NO GROWTH < 24 HOURS Performed at [facility], [address], [place], [place] [address]    Report Status PENDING  Incomplete  MRSA PCR Screening     Status: None   Collection Time: 09/18/20 12:55 AM   Specimen: Nasal Mucosa; Nasopharyngeal  Result Value Ref Range Status   MRSA by PCR NEGATIVE NEGATIVE Final    Comment:        The GeneXpert MRSA Assay (FDA approved for NASAL specimens only), is one component of a comprehensive MRSA colonization surveillance program. It is not intended to diagnose MRSA infection nor to guide or monitor treatment for MRSA infections. Performed at [facility], [address] [place], [place] [address]     Procedures and diagnostic studies:  ECHOCARDIOGRAM COMPLETE  Result Date: 09/17/2020    ECHOCARDIOGRAM REPORT   Patient Name:   BARRETT GOLDIE [facility] Date of Exam: 09/16/2020 Medical Rec #:  [id number]         Height:       70.0 in Accession #:    [id number]        Weight:       169.0 lb Date of Birth:  Dec 16, 1955          BSA:          1.943 m Patient Age:    64 years          BP:           116/72 mmHg Patient Gender: M                 HR:           126 bpm. Exam Location:  [facility] Procedure: 2D Echo, Cardiac Doppler and Color Doppler Indications:     I50.31 Acute Diastolic CHF  History:         Patient has prior history of Echocardiogram examinations, most                   recent 04/13/2019. Risk Factors:Hypertension and Diabetes.                  Peripheral vascular disease.  Sonographer:     Sedonia Small Rodgers-Jones Referring Phys:  [id number] Marrion Coy Diagnosing Phys: Arnoldo Hooker MD IMPRESSIONS  1. Left ventricular ejection fraction, by estimation, is 65 to 70%. The left ventricle has normal function. The left ventricle has no regional wall motion abnormalities. Left ventricular diastolic parameters were normal.  2. Right ventricular systolic function is normal. The right ventricular size is normal.  3. The mitral valve  is normal in structure. Trivial mitral valve regurgitation.  4. The aortic valve is normal in structure. Aortic valve regurgitation is trivial. FINDINGS  Left Ventricle: Left ventricular ejection fraction, by estimation, is 65 to 70%. The left ventricle has normal function. The left ventricle has no regional wall motion abnormalities. The left ventricular internal cavity size was normal in size. There is  no left ventricular hypertrophy. Left ventricular diastolic parameters were normal. Right Ventricle: The right ventricular size is normal. No increase in right ventricular wall thickness. Right ventricular systolic function is normal. Left Atrium: Left atrial size was normal in size. Right Atrium: Right atrial size was normal in size. Pericardium: There is no evidence of pericardial effusion. Mitral Valve: The mitral valve is normal in structure. Trivial mitral valve regurgitation. Tricuspid Valve: The tricuspid valve is normal in structure. Tricuspid valve regurgitation is trivial. Aortic Valve: The aortic valve is normal in structure. Aortic valve regurgitation is trivial. Aortic valve mean gradient measures 10.0 mmHg. Aortic valve peak gradient measures 15.4 mmHg. Aortic valve area, by VTI measures 2.17 cm. Pulmonic Valve: The pulmonic valve was normal in structure. Pulmonic valve regurgitation is not visualized. Aorta: The aortic root and ascending  aorta are structurally normal, with no evidence of dilitation. IAS/Shunts: No atrial level shunt detected by color flow Doppler.  LEFT VENTRICLE PLAX 2D LVIDd:         3.65 cm  Diastology LVIDs:         2.69 cm  LV e' medial:    10.10 cm/s LV PW:         0.92 cm  LV E/e' medial:  8.3 LV IVS:        1.00 cm  LV e' lateral:   12.30 cm/s LVOT diam:     2.00 cm  LV E/e' lateral: 6.8 LV SV:         55 LV SV Index:   28 LVOT Area:     3.14 cm  RIGHT VENTRICLE RV Basal diam:  3.37 cm RV S prime:     23.60 cm/s TAPSE (M-mode): 2.5 cm LEFT ATRIUM             Index       RIGHT ATRIUM           Index LA diam:        4.10 cm 2.11 cm/m  RA Area:     11.20 cm LA Vol (A2C):   41.7 ml 21.46 ml/m RA Volume:   24.90 ml  12.81 ml/m LA Vol (A4C):   26.7 ml 13.74 ml/m LA Biplane Vol: 35.8 ml 18.42 ml/m  AORTIC VALVE AV Area (Vmax):    1.73 cm AV Area (Vmean):   1.83 cm AV Area (VTI):     2.17 cm AV Vmax:           196.00 cm/s AV Vmean:          148.000 cm/s AV VTI:            0.254 m AV Peak Grad:      15.4 mmHg AV Mean Grad:      10.0 mmHg LVOT Vmax:         108.00 cm/s LVOT Vmean:        86.100 cm/s LVOT VTI:          0.176 m LVOT/AV VTI ratio: 0.69  AORTA Ao Root diam: 3.10 cm MITRAL VALVE  TRICUSPID VALVE MV Area (PHT): 3.85 cm     TR Peak grad:   22.3 mmHg MV Decel Time: 197 msec     TR Vmax:        236.00 cm/s MV E velocity: 83.40 cm/s MV A velocity: 107.00 cm/s  SHUNTS MV E/A ratio:  0.78         Systemic VTI:  0.18 m                             Systemic Diam: 2.00 cm Arnoldo Hooker MD Electronically signed by Arnoldo Hooker MD Signature Date/Time: 09/17/2020/12:36:11 PM    Final                LOS: 2 days   Lurene Shadow  [facility]   Pager on [email or web address]. If 7PM-7AM, please contact night-coverage at [email or web address]     09/18/2020, 3:46 PM

## 2020-09-19 ENCOUNTER — Encounter
Admission: EM | Disposition: A | Discharge status: Skilled Nursing Facility | Payer: Medicare Other | Source: Home / Self Care | Attending: Internal Medicine

## 2020-09-19 ENCOUNTER — Encounter: Payer: Self-pay | Admitting: Vascular Surgery

## 2020-09-19 DIAGNOSIS — L03119 Cellulitis of unspecified part of limb: Secondary | ICD-10-CM | POA: Diagnosis not present

## 2020-09-19 DIAGNOSIS — I739 Peripheral vascular disease, unspecified: Secondary | ICD-10-CM | POA: Diagnosis not present

## 2020-09-19 DIAGNOSIS — I70261 Atherosclerosis of native arteries of extremities with gangrene, right leg: Secondary | ICD-10-CM

## 2020-09-19 HISTORY — PX: LOWER EXTREMITY ANGIOGRAPHY: CATH118251

## 2020-09-19 LAB — GLUCOSE, CAPILLARY
Glucose-Capillary: 80 mg/dL (ref 70–99)
Glucose-Capillary: 61 mg/dL — ABNORMAL LOW (ref 70–99)
Glucose-Capillary: 81 mg/dL (ref 70–99)
Glucose-Capillary: 95 mg/dL (ref 70–99)

## 2020-09-19 SURGERY — LOWER EXTREMITY ANGIOGRAPHY
Anesthesia: Moderate Sedation | Laterality: Left

## 2020-09-19 MED ORDER — SODIUM CHLORIDE 0.9% FLUSH
3.0000 mL | INTRAVENOUS | Status: DC | PRN
  Administered 2020-09-20 – 2020-09-22 (×2): 3 mL via INTRAVENOUS

## 2020-09-19 MED ORDER — SODIUM CHLORIDE 0.9% FLUSH
3.0000 mL | Freq: Two times a day (BID) | INTRAVENOUS | Status: DC
  Administered 2020-09-19 – 2020-09-22 (×6): 3 mL via INTRAVENOUS

## 2020-09-19 MED ORDER — MIDAZOLAM HCL 2 MG/2ML IJ SOLN
INTRAMUSCULAR | Status: DC | PRN
  Administered 2020-09-19: 1 mg via INTRAVENOUS
  Administered 2020-09-19: 0.5 mg via INTRAVENOUS
  Administered 2020-09-19: 1 mg via INTRAVENOUS

## 2020-09-19 MED ORDER — FENTANYL CITRATE (PF) 100 MCG/2ML IJ SOLN
INTRAMUSCULAR | Status: DC | PRN
  Administered 2020-09-19 (×3): 25 ug via INTRAVENOUS

## 2020-09-19 MED ORDER — SODIUM CHLORIDE 0.9 % IV SOLN: INTRAVENOUS | Status: AC

## 2020-09-19 MED ORDER — HEPARIN SODIUM (PORCINE) 1000 UNIT/ML IJ SOLN
INTRAMUSCULAR | Status: AC
  Filled 2020-09-19: qty 1

## 2020-09-19 MED ORDER — ACETAMINOPHEN 325 MG PO TABS
650.0000 mg | ORAL_TABLET | ORAL | Status: DC | PRN
  Administered 2020-09-19 – 2020-09-20 (×2): 650 mg via ORAL

## 2020-09-19 MED ORDER — DEXTROSE 50 % IV SOLN
INTRAVENOUS | Status: AC
  Administered 2020-09-19: 50 mL
  Filled 2020-09-19: qty 50

## 2020-09-19 MED ORDER — MORPHINE SULFATE (PF) 4 MG/ML IV SOLN: 2.0000 mg | INTRAVENOUS | Status: DC | PRN

## 2020-09-19 MED ORDER — IODIXANOL 320 MG/ML IV SOLN
INTRAVENOUS | Status: DC | PRN
  Administered 2020-09-19: 60 mL

## 2020-09-19 MED ORDER — OXYCODONE HCL 5 MG PO TABS
5.0000 mg | ORAL_TABLET | ORAL | Status: DC | PRN
  Administered 2020-09-21 (×3): 5 mg via ORAL
  Filled 2020-09-19: qty 1
  Filled 2020-09-19: qty 2
  Filled 2020-09-19 (×2): qty 1

## 2020-09-19 MED ORDER — MIDAZOLAM HCL 5 MG/5ML IJ SOLN
INTRAMUSCULAR | Status: AC
  Filled 2020-09-19: qty 5

## 2020-09-19 MED ORDER — FENTANYL CITRATE (PF) 100 MCG/2ML IJ SOLN
INTRAMUSCULAR | Status: AC
  Filled 2020-09-19: qty 2

## 2020-09-19 MED ORDER — SODIUM CHLORIDE 0.9 % IV SOLN: 250.0000 mL | INTRAVENOUS | Status: DC | PRN

## 2020-09-19 SURGICAL SUPPLY — 11 items
CANNULA 5F STIFF (CANNULA) ×2 IMPLANT
CATH ANGIO 5F PIGTAIL 65CM (CATHETERS) ×2 IMPLANT
CATH BEACON 5 .035 65 KMP TIP (CATHETERS) ×2 IMPLANT
COVER PROBE U/S 5X48 (MISCELLANEOUS) ×2 IMPLANT
DEVICE STARCLOSE SE CLOSURE (Vascular Products) ×2 IMPLANT
GLIDEWIRE ADV .035X260CM (WIRE) ×2 IMPLANT
PACK ANGIOGRAPHY (CUSTOM PROCEDURE TRAY) ×2 IMPLANT
SHEATH BRITE TIP 5FRX11 (SHEATH) ×2 IMPLANT
SYR MEDRAD MARK 7 150ML (SYRINGE) ×2 IMPLANT
TUBING CONTRAST HIGH PRESS 48 (TUBING) ×4 IMPLANT
WIRE GUIDERIGHT .035X150 (WIRE) ×2 IMPLANT

## 2020-09-19 NOTE — Progress Notes (Signed)
Md's aware, pt has been yellow mews score

## 2020-09-19 NOTE — Progress Notes (Signed)
PODIATRY / FOOT AND ANKLE SURGERY PROGRESS NOTE  Requesting Physician: Dr. Myriam ForehandAyiku  Reason for consult: Bilateral foot wounds  Chief Complaint: Bilateral foot wounds, leg swelling   HPI: Brad Frost is a 65 y.o. male who presents resting in bed fairly comfortably and appears to be tired today.  Patient recently underwent revascularization attempt to the right lower extremity.  Patient was noted to have occlusion of his previous femoropopliteal bypass.  He was noted to have occlusion of major vessels to the right lower extremity with little to no flow to the right foot and ankle.  Patient's dressing appeared to be clean and intact today to bilateral lower extremities.  Patient is complaining of some pain to the right lower extremity.  PMHx:  Past Medical History:  Diagnosis Date  . Diabetes mellitus without complication (HCC)   . Hypertension   . Peripheral vascular disease (HCC)     Surgical Hx:  Past Surgical History:  Procedure Laterality Date  . LEG SURGERY  August 20, 2014   Right Leg  BPG  . LOWER EXTREMITY ANGIOGRAPHY Left 09/19/2020   Procedure: Lower Extremity Angiography;  Surgeon: Renford DillsSchnier, Gregory G, MD;  Location: [facility] INVASIVE CV LAB;  Service: Cardiovascular;  Laterality: Left;    FHx:  Family History  Problem Relation Age of Onset  . Hypertension Mother     Social History:  reports that he has quit smoking. His smoking use included cigarettes. He has never used smokeless tobacco. He reports current alcohol use. He reports that he does not use drugs.  Allergies: No Known Allergies   Medications Prior to Admission  Medication Sig Dispense Refill  . aspirin 81 MG tablet Take 81 mg by mouth daily. (Patient not taking: No sig reported)    . metFORMIN (GLUCOPHAGE) 500 MG tablet Take 500 mg by mouth daily. (Patient not taking: No sig reported)    . metoprolol succinate (TOPROL-XL) 25 MG 24 hr tablet Take 1 tablet (25 mg total) by mouth daily. (Patient not taking: No  sig reported)    . rivaroxaban (XARELTO) 20 MG TABS tablet Take 1 tablet (20 mg total) by mouth daily with supper. Starting from January 1 after finishing twice daily dose. (Patient not taking: No sig reported) 30 tablet   . tamsulosin (FLOMAX) 0.4 MG CAPS capsule Take 0.4 mg by mouth daily. (Patient not taking: No sig reported)      Physical Exam: General: Alert and oriented.  No apparent distress.  Vascular: DP/PT pulses nonpalpable bilateral.  Feet do appear to be fairly warm to touch.  No hair growth noted to bilateral lower extremities.  Mild to moderate nonpitting edema noted to bilateral lower extremities, greatly improved compared to initial visit.  Neuro: Light touch sensation reduced to bilateral lower extremities  Derm: Lichenification of skin noted about both lower legs circumferentially from below the knee to the ankle area with severe nonpitting edema consistent with lymphedema and venous disease.  Circumferential dermal ulceration to the left lower extremity around the ankle area appears to be resolved today, edema appears to be improving and appears to be mild today.  Circumferential RLE leg wounds: No erythema, mild edema, minimal serous sanguinous drainage, mild odor.   Wound also present to the distal aspect of the right hallux that appears to be stable at this time but does appear to have dry gangrene present.  No obvious bone exposed at this time, no erythema, no edema, no signs of infection present, no drainage.  Maggots found  throughout the wound area.  Darkening skin discoloration over the right forefoot and midfoot as early changes presence of gangrenous necrosis as well.    Wound present to the dorsal medial aspect of the PIPJ of the right second toe that appears to go to the subcutaneous tissue which measures approximately 0.2 x 0.3 x 0.1 cm, no erythema, no edema, no drainage, no signs of infection present.  MSK: Mild pain on palpation to both legs and  feet  Results for orders placed or performed during the hospital encounter of 09/16/20 (from the past 48 hour(s))  Glucose, capillary     Status: Abnormal   Collection Time: 09/17/20  9:06 PM  Result Value Ref Range   Glucose-Capillary 102 (H) 70 - 99 mg/dL    Comment: Glucose reference range applies only to samples taken after fasting for at least 8 hours.   Comment 1 Notify RN   MRSA PCR Screening     Status: None   Collection Time: 09/18/20 12:55 AM   Specimen: Nasal Mucosa; Nasopharyngeal  Result Value Ref Range   MRSA by PCR NEGATIVE NEGATIVE    Comment:        The GeneXpert MRSA Assay (FDA approved for NASAL specimens only), is one component of a comprehensive MRSA colonization surveillance program. It is not intended to diagnose MRSA infection nor to guide or monitor treatment for MRSA infections. Performed at [facility], [address] [place], [place] [address]   Glucose, capillary     Status: Abnormal   Collection Time: 09/18/20  3:49 AM  Result Value Ref Range   Glucose-Capillary 100 (H) 70 - 99 mg/dL    Comment: Glucose reference range applies only to samples taken after fasting for at least 8 hours.   Comment 1 Notify RN   Glucose, capillary     Status: None   Collection Time: 09/18/20  7:53 AM  Result Value Ref Range   Glucose-Capillary 83 70 - 99 mg/dL    Comment: Glucose reference range applies only to samples taken after fasting for at least 8 hours.  Glucose, capillary     Status: None   Collection Time: 09/18/20 11:38 AM  Result Value Ref Range   Glucose-Capillary 95 70 - 99 mg/dL    Comment: Glucose reference range applies only to samples taken after fasting for at least 8 hours.  Glucose, capillary     Status: Abnormal   Collection Time: 09/18/20  4:24 PM  Result Value Ref Range   Glucose-Capillary 110 (H) 70 - 99 mg/dL    Comment: Glucose reference range applies only to samples taken after fasting for at least 8 hours.  Glucose,  capillary     Status: None   Collection Time: 09/18/20  7:56 PM  Result Value Ref Range   Glucose-Capillary 98 70 - 99 mg/dL    Comment: Glucose reference range applies only to samples taken after fasting for at least 8 hours.  CBC with Differential     Status: Abnormal   Collection Time: 09/18/20  8:36 PM  Result Value Ref Range   WBC 10.9 (H) 4.0 - 10.5 K/uL   RBC 3.49 (L) 4.22 - 5.81 MIL/uL   Hemoglobin 8.3 (L) 13.0 - 17.0 g/dL    Comment: Reticulocyte Hemoglobin testing may be clinically indicated, consider ordering this additional test [id number]    HCT 26.2 (L) 39.0 - 52.0 %   MCV 75.1 (L) 80.0 - 100.0 fL   MCH 23.8 (L) 26.0 - 34.0  pg   MCHC 31.7 30.0 - 36.0 g/dL   RDW 16.1 (H) 09.6 - 04.5 %   Platelets 377 150 - 400 K/uL   nRBC 0.0 0.0 - 0.2 %   Neutrophils Relative % 72 %   Neutro Abs 7.7 1.7 - 7.7 K/uL   Lymphocytes Relative 12 %   Lymphs Abs 1.3 0.7 - 4.0 K/uL   Monocytes Relative 14 %   Monocytes Absolute 1.6 (H) 0.1 - 1.0 K/uL   Eosinophils Relative 2 %   Eosinophils Absolute 0.2 0.0 - 0.5 K/uL   Basophils Relative 0 %   Basophils Absolute 0.0 0.0 - 0.1 K/uL   Immature Granulocytes 0 %   Abs Immature Granulocytes 0.04 0.00 - 0.07 K/uL    Comment: Performed at [facility], [address] [place], [place] [address]  Comprehensive metabolic panel     Status: Abnormal   Collection Time: 09/18/20  8:36 PM  Result Value Ref Range   Sodium 134 (L) 135 - 145 mmol/L   Potassium 3.9 3.5 - 5.1 mmol/L   Chloride 105 98 - 111 mmol/L   CO2 20 (L) 22 - 32 mmol/L   Glucose, Bld 109 (H) 70 - 99 mg/dL    Comment: Glucose reference range applies only to samples taken after fasting for at least 8 hours.   BUN 10 8 - 23 mg/dL   Creatinine, Ser 1.91 0.61 - 1.24 mg/dL   Calcium 7.8 (L) 8.9 - 10.3 mg/dL   Total Protein 5.9 (L) 6.5 - 8.1 g/dL   Albumin 1.7 (L) 3.5 - 5.0 g/dL   AST 41 15 - 41 U/L   ALT 20 0 - 44 U/L   Alkaline Phosphatase 74 38 - 126 U/L   Total  Bilirubin 1.3 (H) 0.3 - 1.2 mg/dL   GFR, Estimated >47 >82 mL/min    Comment: (NOTE) Calculated using the CKD-EPI Creatinine Equation (2021)    Anion gap 9 5 - 15    Comment: Performed at [facility], [address] [place], [place] [address]  Lactic acid, plasma     Status: None   Collection Time: 09/18/20  8:36 PM  Result Value Ref Range   Lactic Acid, Venous 0.8 0.5 - 1.9 mmol/L    Comment: Performed at [facility], [address] [place], [place] [address]  Protime-INR     Status: Abnormal   Collection Time: 09/18/20  8:36 PM  Result Value Ref Range   Prothrombin Time 31.5 (H) 11.4 - 15.2 seconds   INR 3.1 (H) 0.8 - 1.2    Comment: (NOTE) INR goal varies based on device and disease states. Performed at [facility], [address] [place], [place] [address]   APTT     Status: Abnormal   Collection Time: 09/18/20  8:36 PM  Result Value Ref Range   aPTT 55 (H) 24 - 36 seconds    Comment:        IF BASELINE aPTT IS ELEVATED, SUGGEST PATIENT RISK ASSESSMENT BE USED TO DETERMINE APPROPRIATE ANTICOAGULANT THERAPY. Performed at [facility], [address] [place], [place] [address]   Lactic acid, plasma     Status: None   Collection Time: 09/18/20 10:28 PM  Result Value Ref Range   Lactic Acid, Venous 0.9 0.5 - 1.9 mmol/L    Comment: Performed at [facility], [address] [place], [place] [address]  Glucose, capillary     Status: None   Collection Time:  09/19/20  8:09 AM  Result Value Ref Range   Glucose-Capillary 80 70 - 99 mg/dL    Comment: Glucose reference range applies only to samples taken after fasting for at least 8 hours.  Glucose, capillary     Status: Abnormal   Collection Time: 09/19/20 11:32 AM  Result Value Ref Range   Glucose-Capillary 61 (L) 70 - 99 mg/dL    Comment: Glucose reference range applies only to samples taken after fasting for at least 8 hours.  Glucose, capillary     Status: None    Collection Time: 09/19/20  1:38 PM  Result Value Ref Range   Glucose-Capillary 81 70 - 99 mg/dL    Comment: Glucose reference range applies only to samples taken after fasting for at least 8 hours.   PERIPHERAL VASCULAR CATHETERIZATION  Result Date: 09/19/2020 See Op Note   Blood pressure 115/77, pulse (!) 105, temperature 98.2 F (36.8 C), temperature source Oral, resp. rate 16, height 5\' 10"  (1.778 m), weight 76.7 kg, SpO2 98 %.  Assessment 1. 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 Dorsal R 2nd toe ulceration 3. Concern for some worsening necrosis of the right forefoot to midfoot, dry gangrene setting in 4. Circumferential venous/lymphedema wounds to lower legs 5. DM2 with polyneuropathy 6. EtOH Abuse 7. Maggot infestation  Plan -Pt seen and evaluated. -Wounds cleansed to bilateral lower extremities with gauze removing all fibrous slough and maggots present to all wounds of both feet.  Applied Betadine gauze to the right toe wounds, applied silver alginate to the circumferential right lower extremity wound, applied 4 x 4's, ABD, Kerlix, Coban with mild compression.  Applied kerlix and Coban to left lower extremity. -Dressing orders placed with wound care.  Recommend dressing changes daily.  Recommend applying Betadine paints to the first and second toes of the right foot.  Then recommend application of silver alginate to the right lower leg wounds followed by 4 x 4 gauze, Kerlix, Coban with mild compression. -Right hallux ulceration appears to be stable at this time but does appear to have dry gangrene present.  Right second toe wound also appears to be stable.  Right forefoot appears to have some more dusky changes present all the way to the midfoot area concerning for new dry gangrene setting in. -Appreciate vascular recommendations.  From podiatry standpoint no intervention can be performed to the right lower extremity due to blood flow present currently.   Patient has minimal to no flow to the right foot.  Patient would likely need AKA if amputation is needed. -No surgical intervention is indicated at this time as wounds to both legs and the right foot appears to be stable.  Edema improving overall. -Recommend continue dressing changes daily.  Patient needs to follow-up in the wound care center as well for these and would potentially benefit from [facility] boot placements in the future or multilayer compression of dressings.  This is difficult situation as the patient does have peripheral vascular disease as well which complicates the amount of compression that can be applied. -Appreciate recommendations for antibiotic management.  Patient can likely be discharged on oral broad-spectrum antibiotics when stable. -Podiatry team to sign off at this point.  Patient to follow-up in the wound care center.   Har[place] DPM 09/19/2020, 5:20 PM

## 2020-09-19 NOTE — Progress Notes (Addendum)
Progress Note    Brad Frost  DGL:[id number] DOB: [birth year]  DOA: 09/16/2020 PCP: [name], [name], MD      Brief Narrative:    Medical records reviewed and are as summarized below:  Brad Frost is a 65 y.o. male       Assessment/Plan:   Active Problems:   PVD (peripheral vascular disease) (HCC)   Type 2 diabetes mellitus with diabetic neuropathy, without long-term current use of insulin (HCC)   Cellulitis of lower extremity   Toe ulcer, right (HCC)   Body mass index is 24.25 kg/m.   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  With those chronic skin changes on the legs, it is difficult to completely rule in or rule out cellulitis.  For now, IV antibiotics will be continued for treatment of presumed cellulitis of bilateral legs.  Plan for angiography today.  Follow-up with vascular surgeon.  Type II DM with peripheral neuropathy, hypoglycemia: Glucose was 61 this morning.  This was likely from n.p.o. status for today's angiography.  NovoLog as needed for hyperglycemia.   Alcohol use disorder with alcohol withdrawal syndrome.: Ativan as needed.  CIWA protocol  Sinus tachycardia: Continue metoprolol.  Lactic acidosis: Improved.  Probably from metformin.  History of DVT: Continue Xarelto  BNP was normal.  2D echo was unremarkable.  No evidence of CHF at this time.   Diet Order            Diet NPO time specified  Diet effective midnight                    Consultants:  Podiatrist  Vascular surgeon  Procedures:  None    Medications:   . [MAR Hold] aspirin  81 mg Oral Daily  . fentaNYL      . heparin sodium (porcine)      . [MAR Hold] insulin aspart  0-9 Units Subcutaneous TID WC  . [MAR Hold] LORazepam   0-4 mg Intravenous Q12H   Or  . [MAR Hold] LORazepam  0-4 mg Oral Q12H  . [MAR Hold] metoprolol succinate  25 mg Oral Daily  . midazolam      . [MAR Hold] rivaroxaban  20 mg Oral Q supper  . [MAR Hold] sodium chloride flush  3 mL Intravenous Q12H  . [MAR Hold] sulfamethoxazole-trimethoprim  1 tablet Oral Q12H  . [MAR Hold] tamsulosin  0.4 mg Oral Daily  . [MAR Hold] thiamine  100 mg Oral Daily   Or  . [MAR Hold] thiamine  100 mg Intravenous Daily   Continuous Infusions: . [MAR Hold] sodium chloride 10 mL/hr at 09/18/20 2305  . sodium chloride 100 mL/hr at 09/19/20 0643  . [MAR Hold] cefTRIAXone (ROCEPHIN)  IV Stopped (09/18/20 2305)     Anti-infectives (From admission, onward)   Start     Dose/Rate Route Frequency Ordered Stop   09/19/20 0600  [MAR Hold]  ceFAZolin (ANCEF) IVPB 2g/100 mL premix        (MAR Hold since Fri 09/19/2020 at 1130.Hold Reason: Transfer to a Procedural area.)   2 g 200 mL/hr over 30 Minutes Intravenous On call to O.R. 09/18/20 1839 09/19/20 1244   09/18/20 2200  [MAR Hold]  sulfamethoxazole-trimethoprim (BACTRIM DS) 800-160 MG per tablet 1 tablet        (  MAR Hold since Fri 09/19/2020 at 1130.Hold Reason: Transfer to a Procedural area.)   1 tablet Oral Every 12 hours 09/18/20 1552     09/18/20 2115  [MAR Hold]  cefTRIAXone (ROCEPHIN) 2 g in sodium chloride 0.9 % 100 mL IVPB        (MAR Hold since Fri 09/19/2020 at 1130.Hold Reason: Transfer to a Procedural area.)   2 g 200 mL/hr over 30 Minutes Intravenous Every 24 hours 09/18/20 2025     09/17/20 2000  vancomycin (VANCOREADY) IVPB 1000 mg/200 mL  Status:  Discontinued        1,000 mg 200 mL/hr over 60 Minutes Intravenous Every 12 hours 09/17/20 1027 09/18/20 1552   09/17/20 0800  vancomycin (VANCOCIN) IVPB 1000 mg/200 mL premix  Status:  Discontinued        1,000 mg 200 mL/hr over 60 Minutes Intravenous Every 12 hours 09/16/20 1723 09/17/20 1027   09/16/20 1800  cefTRIAXone (ROCEPHIN) 1 g in sodium chloride  0.9 % 100 mL IVPB  Status:  Discontinued        1 g 200 mL/hr over 30 Minutes Intravenous Every 24 hours 09/16/20 1648 09/18/20 1552   09/16/20 1730  vancomycin (VANCOREADY) IVPB 750 mg/150 mL        750 mg 150 mL/hr over 60 Minutes Intravenous  Once 09/16/20 1723 09/16/20 2220   09/16/20 1445  ceFEPIme (MAXIPIME) 2 g in sodium chloride 0.9 % 100 mL IVPB        2 g 200 mL/hr over 30 Minutes Intravenous  Once 09/16/20 1442 09/16/20 1553   09/16/20 1445  vancomycin (VANCOCIN) IVPB 1000 mg/200 mL premix        1,000 mg 200 mL/hr over 60 Minutes Intravenous  Once 09/16/20 1442 09/16/20 1723             Family Communication/Anticipated D/C date and plan/Code Status   DVT prophylaxis:  rivaroxaban (XARELTO) tablet 20 mg     Code Status: Full Code  Family Communication: None Disposition Plan:    Status is: Inpatient  Remains inpatient appropriate because:IV treatments appropriate due to intensity of illness or inability to take PO and Inpatient level of care appropriate due to severity of illness   Dispo: The patient is from: Home              Anticipated d/c is to: Home              Patient currently is not medically stable to d/c.   Difficult to place patient No           Subjective:   Interval events noted.  He had fever last night with T-max of 100.6.  No shortness of breath, cough, chest pain or any urinary symptoms.   Objective:    Vitals:   09/19/20 1227 09/19/20 1232 09/19/20 1237 09/19/20 1244  BP: 119/74 105/71 134/85 (!) 140/92  Pulse: (!) 103 100 (!) 110 (!) 111  Resp: 14 17 19 19   Temp:      TempSrc:      SpO2: 100% 99% 99% 98%  Weight:      Height:       No data found.   Intake/Output Summary (Last 24 hours) at 09/19/2020 1248 Last data filed at 09/19/2020 0643 Gross per 24 hour  Intake 955.44 ml  Output 200 ml  Net 755.44 ml   Filed Weights   09/16/20 1424  Weight: 76.7 kg    Exam:   GEN: NAD SKIN:  Warm and dry EYES:  EOMI  ENT: MMM CV: RRR PULM: CTA B ABD: soft, ND, NT, +BS CNS: AAO x 3, non focal EXT: Bilateral legs are being wrapped        Data Reviewed:   I have personally reviewed following labs and imaging studies:  Labs: Labs show the following:   Basic Metabolic Panel: Recent Labs  Lab 09/16/20 1501 09/17/20 0722 09/18/20 2036  NA 129* 131* 134*  K 4.2 4.5 3.9  CL 100 103 105  CO2 18* 20* 20*  GLUCOSE 82 78 109*  BUN CREATININE 1.03 0.82 0.92  CALCIUM 7.9* 7.9* 7.8*   GFR Estimated Creatinine Clearance: 81.6 mL/min (by C-G formula based on SCr of 0.92 mg/dL). Liver Function Tests: Recent Labs  Lab 09/16/20 1501 09/18/20 2036  AST 17 41  ALT 14 20  ALKPHOS 80 74  BILITOT 0.5 1.3*  PROT 6.7 5.9*  ALBUMIN 2.1* 1.7*   No results for input(s): LIPASE, AMYLASE in the last 168 hours. No results for input(s): AMMONIA in the last 168 hours. Coagulation profile Recent Labs  Lab 09/16/20 1501 09/18/20 2036  INR 1.0 3.1*    CBC: Recent Labs  Lab 09/16/20 1501 09/17/20 0722 09/18/20 2036  WBC 6.6 9.6 10.9*  NEUTROABS 4.0  --  7.7  HGB 9.0* 8.7* 8.3*  HCT 28.8* 26.9* 26.2*  MCV 76.6* 74.1* 75.1*  PLT 361 372 377   Cardiac Enzymes: No results for input(s): CKTOTAL, CKMB, CKMBINDEX, TROPONINI in the last 168 hours. BNP (last 3 results) No results for input(s): PROBNP in the last 8760 hours. CBG: Recent Labs  Lab 09/18/20 1138 09/18/20 1624 09/18/20 1956 09/19/20 0809 09/19/20 1132  GLUCAP 95 110* 98 80 61*   D-Dimer: No results for input(s): DDIMER in the last 72 hours. Hgb A1c: Recent Labs    09/16/20 1824  HGBA1C 5.7*   Lipid Profile: No results for input(s): CHOL, HDL, LDLCALC, TRIG, CHOLHDL, LDLDIRECT in the last 72 hours. Thyroid function studies: No results for input(s): TSH, T4TOTAL, T3FREE, THYROIDAB in the last 72 hours.  Invalid input(s): FREET3 Anemia work up: Recent Labs    09/16/20 1501  VITAMINB12 275  TIBC 276   IRON 18*   Sepsis Labs: Recent Labs  Lab 09/16/20 1501 09/16/20 1526 09/16/20 1824 09/17/20 0722 09/18/20 2036 09/18/20 2228  WBC 6.6  --   --  9.6 10.9*  --   LATICACIDVEN  --  2.0* 1.6  --  0.8 0.9    Microbiology Recent Results (from the past 240 hour(s))  Culture, blood (Routine x 2)     Status: None (Preliminary result)   Collection Time: 09/16/20  2:23 PM   Specimen: BLOOD  Result Value Ref Range Status   Specimen Description BLOOD BLOOD LEFT FOREARM  Final   Special Requests   Final    BOTTLES DRAWN AEROBIC ONLY Blood Culture results may not be optimal due to an inadequate volume of blood received in culture bottles   Culture   Final    NO GROWTH 3 DAYS Performed at [facility], [address] [place], [place] [address]    Report Status PENDING  Incomplete  Resp Panel by RT-PCR (Flu A&B, Covid) Nasopharyngeal Swab     Status: None   Collection Time: 09/16/20  2:23 PM   Specimen: Nasopharyngeal Swab; Nasopharyngeal(NP) swabs in vial transport medium  Result Value Ref Range Status   SARS Coronavirus 2 by RT PCR NEGATIVE NEGATIVE Final  Comment: (NOTE) SARS-CoV-2 target nucleic acids are NOT DETECTED.  The SARS-CoV-2 RNA is generally detectable in upper respiratory specimens during the acute phase of infection. The lowest concentration of SARS-CoV-2 viral copies this assay can detect is 138 copies/mL. A negative result does not preclude SARS-Cov-2 infection and should not be used as the sole basis for treatment or other patient management decisions. A negative result may occur with  improper specimen collection/handling, submission of specimen other than nasopharyngeal swab, presence of viral mutation(s) within the areas targeted by this assay, and inadequate number of viral copies(<138 copies/mL). A negative result must be combined with clinical observations, patient history, and epidemiological information. The expected result is Negative.  Fact  Sheet for Patients:  BloggerCourse.com  Fact Sheet for Healthcare Providers:  SeriousBroker.it  This test is no t yet approved or cleared by the [place] FDA and  has been authorized for detection and/or diagnosis of SARS-CoV-2 by FDA under an Emergency Use Authorization (EUA). This EUA will remain  in effect (meaning this test can be used) for the duration of the COVID-19 declaration under Section 564(b)(1) of the Act, 21 U.S.C.section 360bbb-3(b)(1), unless the authorization is terminated  or revoked sooner.       Influenza A by PCR NEGATIVE NEGATIVE Final   Influenza B by PCR NEGATIVE NEGATIVE Final    Comment: (NOTE) The Xpert Xpress SARS-CoV-2/FLU/RSV plus assay is intended as an aid in the diagnosis of influenza from Nasopharyngeal swab specimens and should not be used as a sole basis for treatment. Nasal washings and aspirates are unacceptable for Xpert Xpress SARS-CoV-2/FLU/RSV testing.  Fact Sheet for Patients: BloggerCourse.com  Fact Sheet for Healthcare Providers: SeriousBroker.it  This test is not yet approved or cleared by the [place] FDA and has been authorized for detection and/or diagnosis of SARS-CoV-2 by FDA under an Emergency Use Authorization (EUA). This EUA will remain in effect (meaning this test can be used) for the duration of the COVID-19 declaration under Section 564(b)(1) of the Act, 21 U.S.C. section 360bbb-3(b)(1), unless the authorization is terminated or revoked.  Performed at [facility], [address] [place], [place] [address]   Culture, blood (Routine x 2)     Status: None (Preliminary result)   Collection Time: 09/16/20  3:26 PM   Specimen: BLOOD  Result Value Ref Range Status   Specimen Description BLOOD BLOOD RIGHT HAND  Final   Special Requests   Final    BOTTLES DRAWN AEROBIC AND ANAEROBIC Blood Culture  results may not be optimal due to an inadequate volume of blood received in culture bottles   Culture   Final    NO GROWTH 3 DAYS Performed at [facility] [address], [place], [place] [address]    Report Status PENDING  Incomplete  MRSA PCR Screening     Status: None   Collection Time: 09/18/20 12:55 AM   Specimen: Nasal Mucosa; Nasopharyngeal  Result Value Ref Range Status   MRSA by PCR NEGATIVE NEGATIVE Final    Comment:        The GeneXpert MRSA Assay (FDA approved for NASAL specimens only), is one component of a comprehensive MRSA colonization surveillance program. It is not intended to diagnose MRSA infection nor to guide or monitor treatment for MRSA infections. Performed at [facility], [address] [place], [place] [address]     Procedures and diagnostic studies:  No results found.  LOS: 3 days   Lashana Spang  [facility]   Pager on [email or web address]. If 7PM-7AM, please contact night-coverage at [email or web address]     09/19/2020, 12:48 PM

## 2020-09-19 NOTE — Op Note (Signed)
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  Abdominal aortogram  2.  Bilateral lower extremity distal runoff  3.  Third order catheter placement right profunda femoris artery  4.  StarClose left common femoral   Anesthesia: Conscious sedation was administered by the interventional radiology RN under my direct supervision. IV Versed plus fentanyl were utilized. Continuous ECG, pulse oximetry and blood pressure was monitored throughout the entire procedure.  Conscious sedation was administered for a total of 38 minutes and 50 seconds.  Sheath: 5 French Pinnacle sheath left common femoral retrograde  Contrast: 60 cc   Fluoroscopy Time: 3.8 minutes  Indications:  The patient presents to [facility] with gangrenous changes to the right foot.  There are diffuse ulcerations noted bilateral ankles with profound skin changes.  Pedal pulses are nonpalpable bilaterally suggesting atherosclerotic occlusive disease.  The risks and benefits as well as alternative therapies for lower extremity revascularization are reviewed with the patient all questions are answered the patient agrees to proceed.  The patient is therefore undergoing angiography with the hope for intervention for limb salvage.   Procedure:  Brad Frost Enochis a 65 y.o. male who was identified and appropriate procedural time out was performed.  The patient was then placed supine on the table and prepped and draped in the usual sterile fashion.  Ultrasound was used to evaluate the left common femoral artery.  It was echolucent and pulsatile indicating it is patent .  An ultrasound image was acquired for the  permanent record.  A micropuncture needle was used to access the left common femoral artery under direct ultrasound guidance.  The microwire was then advanced under fluoroscopic guidance without difficulty followed by the micro-sheath.  A 0.035 J wire was advanced without resistance and a 5Fr sheath was placed.    Pigtail catheter was then advanced to the level of T12 and AP projection of the aorta was obtained. Pigtail catheter was then repositioned to above the bifurcation and LAO view of the pelvis was obtained. Stiff angled Glidewire and pigtail catheter was then used across the bifurcation and the catheter was positioned in the distal external iliac artery.  RAO of the right groin was then obtained. Wire was reintroduced and negotiated into the SFA and the catheter was advanced into the SFA. Distal runoff was then performed.  Imaging of the left lower extremity was then obtained by hand-injection through the Pinnacle sheath  After review of the images the catheter was removed over wire and an LAO view of the groin was obtained. StarClose device was deployed without difficulty.   Findings:   Aortogram: Abdominal aorta is opacified with a bolus injection contrast.  There are no hemodynamically significant stenoses.  Bilateral common internal and external iliac arteries are all diffusely diseased but widely patent there are no hemodynamically significant stenoses within the aorta iliac system.  Right Lower Extremity: The common femoral demonstrates postoperative changes but is widely patent and there are surgical clips noted in the field.  The profunda femoris is widely patent and has numerous collaterals that coursed down around the knee.  The superficial femoral is a flush occlusion and there is no evidence as to where the original origin was there is no reconstitution of the superficial femoral  or popliteal arteries.  The previous bypass is occluded and again there is no evidence as to where the  origin of the bypass was.  In the mid to distal calf there is reconstitution of the peroneal which is the dominant tibial vessel and collaterals across the ankle.  Secondary to movement it is impossible to see if the pedal arch is filling.  The posterior tibial also reconstitutes but this occludes again at the level of the ankle and there is no evidence of filling of the plantar vessels.  Left Lower Extremity: The common femoral profunda femoris superficial femoral-popliteal arteries are diffusely diseased but widely patent there are no hemodynamically significant stenoses noted.  The trifurcation is diseased with occlusion of the anterior tibial and posterior tibial.  There is no visible reconstitution at the level of the ankle of the dorsalis pedis or the plantar vessels however there is severe motion artifact.  Once again, the tibioperoneal trunk and peroneal is widely patent and the dominant runoff to the foot and does appear to collateralize well crossing the ankle.  Summary: On the right given the gangrenous changes to his toes the patient has vascular anatomy that would allow for a redo femoral to peroneal bypass.  However, his skin changes and excoriation with open wounds extends above the level that the calf incision would require to access the peroneal and given that he will require prosthetic since his vein has been utilized for his initial bypass.  These confounding factors would diminish the likelihood of a successful bypass.  With respect to an amputation the patient would require a above-knee amputation given his SFA occlusion and his profoundly diseased skin below the knee.  The left lower extremity does not require arterial reconstruction at this point.  He has single-vessel runoff via the peroneal which should allow for wound healing.     Disposition: Patient was taken to the recovery room in stable condition having tolerated the procedure well.  Brad Frost 09/19/2020,1:07 PM

## 2020-09-20 ENCOUNTER — Encounter: Payer: Self-pay | Admitting: Internal Medicine

## 2020-09-20 DIAGNOSIS — I739 Peripheral vascular disease, unspecified: Secondary | ICD-10-CM | POA: Diagnosis not present

## 2020-09-20 DIAGNOSIS — L03119 Cellulitis of unspecified part of limb: Secondary | ICD-10-CM | POA: Diagnosis not present

## 2020-09-20 DIAGNOSIS — L97511 Non-pressure chronic ulcer of other part of right foot limited to breakdown of skin: Secondary | ICD-10-CM | POA: Diagnosis not present

## 2020-09-20 LAB — GLUCOSE, CAPILLARY
Glucose-Capillary: 58 mg/dL — ABNORMAL LOW (ref 70–99)
Glucose-Capillary: 76 mg/dL (ref 70–99)
Glucose-Capillary: 131 mg/dL — ABNORMAL HIGH (ref 70–99)
Glucose-Capillary: 132 mg/dL — ABNORMAL HIGH (ref 70–99)
Glucose-Capillary: 114 mg/dL — ABNORMAL HIGH (ref 70–99)

## 2020-09-20 MED ORDER — SULFAMETHOXAZOLE-TRIMETHOPRIM 800-160 MG PO TABS
1.0000 | ORAL_TABLET | Freq: Two times a day (BID) | ORAL | Status: DC
  Administered 2020-09-20 – 2020-09-23 (×6): 1 via ORAL
  Filled 2020-09-20 (×6): qty 1

## 2020-09-20 NOTE — TOC Progression Note (Signed)
Transition of Care [facility]) - Progression Note    Patient Details  Name: Brad Frost MRN: [id number] Date of Birth: 1956/04/02  Transition [facility]) CM/SW Contact  Caryn Section, RN Phone Number: 09/20/2020, 4:13 PM  Clinical Narrative: TOC in to see patient, discussed SNF recommendation.  Patient amenable to SNF.  FL2 complete.  Patient was in pain , so unable to discuss much further.  TOC contact information given.           Expected Discharge Plan and Services                                                 Social Determinants of Health (SDOH) Interventions    Readmission Risk Interventions No flowsheet data found.

## 2020-09-20 NOTE — Progress Notes (Signed)
Saline scrub and dressing change completed to BLE per order.

## 2020-09-20 NOTE — Progress Notes (Addendum)
Progress Note    AMUN STEMM  VEL:[id number] DOB: [birth year]  DOA: 09/16/2020 PCP: [name], [name], MD      Brief Narrative:    Medical records reviewed and are as summarized below:  Brad Frost is a 65 y.o. male       Assessment/Plan:   Active Problems:   PVD (peripheral vascular disease) (HCC)   Type 2 diabetes mellitus with diabetic neuropathy, without long-term current use of insulin (HCC)   Cellulitis of lower extremity   Toe ulcer, right (HCC)   Body mass index is 24.25 kg/m.   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 S/p abdominal aortogram and bilateral lower extremity distal runoff on 09/19/2020.  No plans for surgical intervention.  Type II DM with peripheral neuropathy, recent hypoglycemia: NovoLog as needed for hyperglycemia  Alcohol use disorder with alcohol withdrawal syndrome.: Ativan as needed.  CIWA protocol  Sinus tachycardia: Continue metoprolol.  Lactic acidosis: Improved.  Probably from metformin.  History of DVT: Continue Xarelto  BNP was normal.  2D echo was unremarkable.  No evidence of CHF at this time.   Diet Order            Diet Heart Room service appropriate? Yes; Fluid consistency: Thin  Diet effective now                    Consultants:  Podiatrist  Vascular surgeon  Procedures: S/p abdominal aortogram and bilateral lower extremity distal runoff on 09/19/2020.    Medications:   . aspirin  81 mg Oral Daily  . insulin aspart  0-9 Units Subcutaneous TID WC  . LORazepam  0-4 mg Intravenous Q12H   Or  . LORazepam  0-4 mg Oral Q12H  . metoprolol succinate  25 mg Oral Daily  . rivaroxaban  20 mg Oral Q supper  . sodium chloride flush  3 mL Intravenous Q12H  .  sodium chloride flush  3 mL Intravenous Q12H  . sulfamethoxazole-trimethoprim  1 tablet Oral Q12H  . tamsulosin  0.4 mg Oral Daily  . thiamine  100 mg Oral Daily   Or  . thiamine  100 mg Intravenous Daily   Continuous Infusions: . sodium chloride Stopped (09/19/20 1130)  . sodium chloride 100 mL/hr at 09/20/20 0611  . sodium chloride       Anti-infectives (From admission, onward)   Start     Dose/Rate Route Frequency Ordered Stop   09/20/20 2200  sulfamethoxazole-trimethoprim (BACTRIM DS) 800-160 MG per tablet 1 tablet        1 tablet Oral Every 12 hours 09/20/20 1531     09/19/20 0600  ceFAZolin (ANCEF) IVPB 2g/100 mL premix        2 g 200 mL/hr over 30 Minutes Intravenous On call to O.R. 09/18/20 1839 09/19/20 1250   09/18/20 2200  sulfamethoxazole-trimethoprim (BACTRIM DS) 800-160 MG per tablet 1 tablet  Status:  Discontinued        1 tablet Oral Every 12 hours 09/18/20 1552 09/19/20 1348   09/18/20 2115  cefTRIAXone (ROCEPHIN) 2 g in sodium chloride 0.9 % 100 mL IVPB  Status:  Discontinued        2 g 200 mL/hr over 30 Minutes Intravenous Every 24 hours 09/18/20 2025 09/20/20 1531   09/17/20 2000  vancomycin (VANCOREADY) IVPB 1000 mg/200 mL  Status:  Discontinued        1,000 mg 200 mL/hr over 60 Minutes Intravenous Every 12 hours 09/17/20 1027 09/18/20 1552   09/17/20 0800  vancomycin (VANCOCIN) IVPB 1000 mg/200 mL premix  Status:  Discontinued        1,000 mg 200 mL/hr over 60 Minutes Intravenous Every 12 hours 09/16/20 1723 09/17/20 1027   09/16/20 1800  cefTRIAXone (ROCEPHIN) 1 g in sodium chloride 0.9 % 100 mL IVPB  Status:  Discontinued        1 g 200 mL/hr over 30 Minutes Intravenous Every 24 hours 09/16/20 1648 09/18/20 1552   09/16/20 1730  vancomycin (VANCOREADY) IVPB 750 mg/150 mL        750 mg 150 mL/hr over 60 Minutes Intravenous  Once 09/16/20 1723 09/16/20 2220   09/16/20 1445  ceFEPIme (MAXIPIME) 2 g in sodium chloride 0.9 % 100 mL IVPB        2 g 200 mL/hr  over 30 Minutes Intravenous  Once 09/16/20 1442 09/16/20 1553   09/16/20 1445  vancomycin (VANCOCIN) IVPB 1000 mg/200 mL premix        1,000 mg 200 mL/hr over 60 Minutes Intravenous  Once 09/16/20 1442 09/16/20 1723             Family Communication/Anticipated D/C date and plan/Code Status   DVT prophylaxis:  rivaroxaban (XARELTO) tablet 20 mg     Code Status: Full Code  Family Communication: None Disposition Plan:    Status is: Inpatient  Remains inpatient appropriate because:IV treatments appropriate due to intensity of illness or inability to take PO and Inpatient level of care appropriate due to severity of illness   Dispo: The patient is from: Home              Anticipated d/c is to: Home              Patient currently is not medically stable to d/c.   Difficult to place patient No           Subjective:   Interval events noted. No fever. Pain in the legs is better   Objective:    Vitals:   09/20/20 0110 09/20/20 0608 09/20/20 0803 09/20/20 1011  BP: 111/77 103/74 109/66 97/63  Pulse: (!) 110 (!) 113 (!) 108 (!) 122  Resp: 18 17 20 20   Temp: 98.9 F (37.2 C) 98.6 F (37 C) 98.5 F (36.9 C) 99.2 F (37.3 C)  TempSrc: Oral Oral Oral Oral  SpO2: 94% 97% 95% 95%  Weight:      Height:       No data found.   Intake/Output Summary (Last 24 hours) at 09/20/2020 1531 Last data filed at 09/20/2020 1359 Gross per 24 hour  Intake 1627.22 ml  Output 250 ml  Net 1377.22 ml   Filed Weights   09/16/20 1424  Weight: 76.7 kg    Exam:   GEN: NAD SKIN: Warm and dry EYES: No pallor or icterus ENT: MMM CV: RRR PULM: CTA B ABD: soft, ND, NT, +BS CNS: AAO x 3, non focal EXT: Bilateral legs have been wrapped        Data Reviewed:   I have personally reviewed following labs and imaging studies:  Labs: Labs show the following:   Basic Metabolic Panel: Recent Labs  Lab 09/16/20 1501 09/17/20 0722 09/18/20 2036  NA 129* 131* 134*   K 4.2 4.5 3.9  CL 100 103 105  CO2 18* 20* 20*  GLUCOSE 82 78 109*  BUN 18 14 10   CREATININE 1.03 0.82 0.92  CALCIUM 7.9* 7.9* 7.8*   GFR Estimated Creatinine Clearance: 81.6 mL/min (by C-G formula based on SCr of 0.92 mg/dL). Liver Function Tests: Recent Labs  Lab 09/16/20 1501 09/18/20 2036  AST 17 41  ALT 14 20  ALKPHOS 80 74  BILITOT 0.5 1.3*  PROT 6.7 5.9*  ALBUMIN 2.1* 1.7*   No results for input(s): LIPASE, AMYLASE in the last 168 hours. No results for input(s): AMMONIA in the last 168 hours. Coagulation profile Recent Labs  Lab 09/16/20 1501 09/18/20 2036  INR 1.0 3.1*    CBC: Recent Labs  Lab 09/16/20 1501 09/17/20 0722 09/18/20 2036  WBC 6.6 9.6 10.9*  NEUTROABS 4.0  --  7.7  HGB 9.0* 8.7* 8.3*  HCT 28.8* 26.9* 26.2*  MCV 76.6* 74.1* 75.1*  PLT 361 372 377   Cardiac Enzymes: No results for input(s): CKTOTAL, CKMB, CKMBINDEX, TROPONINI in the last 168 hours. BNP (last 3 results) No results for input(s): PROBNP in the last 8760 hours. CBG: Recent Labs  Lab 09/19/20 1338 09/19/20 2143 09/20/20 0802 09/20/20 0831 09/20/20 1155  GLUCAP 81 95 58* 76 131*   D-Dimer: No results for input(s): DDIMER in the last 72 hours. Hgb A1c: No results for input(s): HGBA1C in the last 72 hours. Lipid Profile: No results for input(s): CHOL, HDL, LDLCALC, TRIG, CHOLHDL, LDLDIRECT in the last 72 hours. Thyroid function studies: No results for input(s): TSH, T4TOTAL, T3FREE, THYROIDAB in the last 72 hours.  Invalid input(s): FREET3 Anemia work up: No results for input(s): VITAMINB12, FOLATE, FERRITIN, TIBC, IRON, RETICCTPCT in the last 72 hours. Sepsis Labs: Recent Labs  Lab 09/16/20 1501 09/16/20 1526 09/16/20 1824 09/17/20 0722 09/18/20 2036 09/18/20 2228  WBC 6.6  --   --  9.6 10.9*  --   LATICACIDVEN  --  2.0* 1.6  --  0.8 0.9    Microbiology Recent Results (from the past 240 hour(s))  Culture, blood (Routine x 2)     Status: None  (Preliminary result)   Collection Time: 09/16/20  2:23 PM   Specimen: BLOOD  Result Value Ref Range Status   Specimen Description BLOOD BLOOD LEFT FOREARM  Final   Special Requests   Final    BOTTLES DRAWN AEROBIC ONLY Blood Culture results may not be optimal due to an inadequate volume of blood received in culture bottles   Culture   Final    NO GROWTH 4 DAYS Performed at [facility], [address] [place], [place] [address]    Report Status PENDING  Incomplete  Resp Panel by RT-PCR (Flu A&B, Covid) Nasopharyngeal Swab     Status: None   Collection Time: 09/16/20  2:23 PM   Specimen: Nasopharyngeal Swab; Nasopharyngeal(NP) swabs in vial transport medium  Result Value Ref Range Status   SARS Coronavirus 2 by RT PCR NEGATIVE NEGATIVE Final    Comment: (NOTE) SARS-CoV-2 target nucleic acids are NOT DETECTED.  The SARS-CoV-2 RNA is generally detectable in upper respiratory specimens during the acute phase of infection. The lowest concentration of SARS-CoV-2 viral copies this assay can detect is 138 copies/mL. A negative result does not preclude SARS-Cov-2 infection and should not be used as the sole basis for treatment or other patient management decisions. A negative result may occur with  improper specimen collection/handling, submission of specimen other than nasopharyngeal swab, presence of viral mutation(s) within the areas targeted by  this assay, and inadequate number of viral copies(<138 copies/mL). A negative result must be combined with clinical observations, patient history, and epidemiological information. The expected result is Negative.  Fact Sheet for Patients:  BloggerCourse.com  Fact Sheet for Healthcare Providers:  SeriousBroker.it  This test is no t yet approved or cleared by the [place] FDA and  has been authorized for detection and/or diagnosis of SARS-CoV-2 by FDA under an Emergency Use  Authorization (EUA). This EUA will remain  in effect (meaning this test can be used) for the duration of the COVID-19 declaration under Section 564(b)(1) of the Act, 21 U.S.C.section 360bbb-3(b)(1), unless the authorization is terminated  or revoked sooner.       Influenza A by PCR NEGATIVE NEGATIVE Final   Influenza B by PCR NEGATIVE NEGATIVE Final    Comment: (NOTE) The Xpert Xpress SARS-CoV-2/FLU/RSV plus assay is intended as an aid in the diagnosis of influenza from Nasopharyngeal swab specimens and should not be used as a sole basis for treatment. Nasal washings and aspirates are unacceptable for Xpert Xpress SARS-CoV-2/FLU/RSV testing.  Fact Sheet for Patients: BloggerCourse.com  Fact Sheet for Healthcare Providers: SeriousBroker.it  This test is not yet approved or cleared by the [place] FDA and has been authorized for detection and/or diagnosis of SARS-CoV-2 by FDA under an Emergency Use Authorization (EUA). This EUA will remain in effect (meaning this test can be used) for the duration of the COVID-19 declaration under Section 564(b)(1) of the Act, 21 U.S.C. section 360bbb-3(b)(1), unless the authorization is terminated or revoked.  Performed at [facility], [address] [place], [place] [address]   Culture, blood (Routine x 2)     Status: None (Preliminary result)   Collection Time: 09/16/20  3:26 PM   Specimen: BLOOD  Result Value Ref Range Status   Specimen Description BLOOD BLOOD RIGHT HAND  Final   Special Requests   Final    BOTTLES DRAWN AEROBIC AND ANAEROBIC Blood Culture results may not be optimal due to an inadequate volume of blood received in culture bottles   Culture   Final    NO GROWTH 4 DAYS Performed at [facility], [address] [place], [place] [address]    Report Status PENDING  Incomplete  MRSA PCR Screening     Status: None   Collection Time: 09/18/20 12:55  AM   Specimen: Nasal Mucosa; Nasopharyngeal  Result Value Ref Range Status   MRSA by PCR NEGATIVE NEGATIVE Final    Comment:        The GeneXpert MRSA Assay (FDA approved for NASAL specimens only), is one component of a comprehensive MRSA colonization surveillance program. It is not intended to diagnose MRSA infection nor to guide or monitor treatment for MRSA infections. Performed at [facility], [address] [place], [place] [address]     Procedures and diagnostic studies:  PERIPHERAL VASCULAR CATHETERIZATION  Result Date: 09/19/2020 See Op Note              LOS: 4 days   Allora Bains  [facility]   Pager on [email or web address]. If 7PM-7AM, please contact night-coverage at [email or web address]     09/20/2020, 3:31 PM

## 2020-09-21 DIAGNOSIS — I739 Peripheral vascular disease, unspecified: Secondary | ICD-10-CM | POA: Diagnosis not present

## 2020-09-21 DIAGNOSIS — L03119 Cellulitis of unspecified part of limb: Secondary | ICD-10-CM | POA: Diagnosis not present

## 2020-09-21 DIAGNOSIS — L97511 Non-pressure chronic ulcer of other part of right foot limited to breakdown of skin: Secondary | ICD-10-CM | POA: Diagnosis not present

## 2020-09-21 LAB — GLUCOSE, CAPILLARY
Glucose-Capillary: 115 mg/dL — ABNORMAL HIGH (ref 70–99)
Glucose-Capillary: 106 mg/dL — ABNORMAL HIGH (ref 70–99)
Glucose-Capillary: 100 mg/dL — ABNORMAL HIGH (ref 70–99)
Glucose-Capillary: 130 mg/dL — ABNORMAL HIGH (ref 70–99)

## 2020-09-21 LAB — CULTURE, BLOOD (ROUTINE X 2)
Culture: NO GROWTH
Culture: NO GROWTH

## 2020-09-21 NOTE — Progress Notes (Signed)
Physical Therapy Treatment Patient Details Name: FATEH KINDLE MRN: [id number] DOB: 1956-04-17 Today's Date: 09/21/2020    History of Present Illness Per MD notes: Pt is a 65 y.o. male with a history of DM, essential hypertension, peripheral vascular disease and chronic alcohol drinking who present to the hospital complaining of bilateral leg swelling and pain.  Patient also has chronic leg edema appears to be lymphedema. 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    PT Comments    Pt was pleasant and motivated to participate during the session but overall required extensive assistance with functional mobility and was unable to ambulate.  Pt found sitting in a standard height recliner and required +2 Max assist to come to standing along with heavy cuing for sequencing.  Once in standing pt required Mod A to prevent posterior LOB that improved minimally with anterior weight shifting activities.  Pt would not be safe to return to his prior living situation and will benefit from PT services in a SNF setting upon discharge to safely address deficits listed in patient problem list for decreased caregiver assistance and eventual return to PLOF.    Follow Up Recommendations  SNF;Supervision/Assistance - 24 hour     Equipment Recommendations  Other (comment) (TBD)    Recommendations for Other Services       Precautions / Restrictions Precautions Precautions: Fall Restrictions Weight Bearing Restrictions: Yes RLE Weight Bearing: Weight bearing as tolerated LLE Weight Bearing: Weight bearing as tolerated Other Position/Activity Restrictions: Bilateral post op shoes donned with WB activity    Mobility  Bed Mobility               General bed mobility  comments: NT, pt found in recliner    Transfers Overall transfer level: Needs assistance Equipment used: Rolling walker (2 wheeled) Transfers: Sit to/from Stand Sit to Stand: Max assist;From elevated surface;+2 physical assistance         General transfer comment: Max verbal and tactile cues for sequencing with heavy +2 assist to needed to stand from a standard height surface  Ambulation/Gait             General Gait Details: Pt unable to advance either LE   Stairs             Wheelchair Mobility    Modified Rankin (Stroke Patients Only)       Balance Overall balance assessment: Needs assistance   Sitting balance-Leahy Scale: Good     Standing balance support: Bilateral upper extremity supported Standing balance-Leahy Scale: Poor Standing balance comment: Min to mod A to maintain static standing balance with a RW                            Cognition Arousal/Alertness: Awake/alert Behavior During Therapy: WFL for tasks assessed/performed Overall Cognitive Status: Within Functional Limits for tasks assessed                                        Exercises Total Joint Exercises Quad Sets: Strengthening;AROM;Both;10 reps Gluteal Sets: Strengthening;Both;10 reps Hip ABduction/ADduction: AAROM;Strengthening;Both;5 reps Straight Leg Raises: Both;Strengthening;AAROM;5 reps Long Arc Quad: Strengthening;Both;AAROM;5 reps;AROM Knee Flexion: AROM;AAROM;Strengthening;Both;5 reps Other Exercises Other Exercises: Positioning  review to promote B knee ext PROM Other Exercises: Anterior weight shifting in standing to address posterior instability    General Comments        Pertinent Vitals/Pain Pain Assessment: No/denies pain    Home Living                      Prior Function            PT Goals (current goals can now be found in the care plan section) Progress towards PT goals: PT to reassess next treatment     Frequency    Min 2X/week      PT Plan Current plan remains appropriate    Co-evaluation              AM-PAC PT "6 Clicks" Mobility   Outcome Measure  Help needed turning from your back to your side while in a flat bed without using bedrails?: A Lot Help needed moving from lying on your back to sitting on the side of a flat bed without using bedrails?: A Lot Help needed moving to and from a bed to a chair (including a wheelchair)?: A Lot Help needed standing up from a chair using your arms (e.g., wheelchair or bedside chair)?: A Lot Help needed to walk in hospital room?: Total Help needed climbing 3-5 steps with a railing? : Total 6 Click Score: 10    End of Session Equipment Utilized During Treatment: Gait belt Activity Tolerance: Patient tolerated treatment well Patient left: in chair;with call bell/phone within reach;with chair alarm set Nurse Communication: Mobility status;Weight bearing status;Precautions PT Visit Diagnosis: Unsteadiness on feet (R26.81);Difficulty in walking, not elsewhere classified (R26.2);Muscle weakness (generalized) (M62.81)     Time: 9485-4627 PT Time Calculation (min) (ACUTE ONLY): 28 min  Charges:  $Therapeutic Exercise: 8-22 mins $Therapeutic Activity: 8-22 mins                     D. Scott Naylee Frankowski PT, DPT 09/21/20, 11:56 AM

## 2020-09-21 NOTE — Progress Notes (Addendum)
Progress Note    Brad Frost  EVO:[id number] DOB: March 29, 1956  DOA: 09/16/2020 PCP: [name], [name], MD      Brief Narrative:    Medical records reviewed and are as summarized below:  Brad Frost is a 65 y.o. male       Assessment/Plan:   Active Problems:   PVD (peripheral vascular disease) (HCC)   Type 2 diabetes mellitus with diabetic neuropathy, without long-term current use of insulin (HCC)   Cellulitis of lower extremity   Toe ulcer, right (HCC)   Body mass index is 24.25 kg/m.   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 S/p abdominal aortogram and bilateral lower extremity distal runoff on 09/19/2020.  No plans for surgical intervention.  Type II DM with peripheral neuropathy, recent hypoglycemia: NovoLog as needed for hyperglycemia.  Alcohol use disorder with alcohol withdrawal syndrome.: Ativan as needed.  CIWA protocol  Sinus tachycardia: Continue metoprolol.  Lactic acidosis: Improved.  Probably from metformin.  History of DVT: Continue Xarelto  BNP was normal.  2D echo was unremarkable.  No evidence of CHF at this time.  Awaiting placement to SNF.   Diet Order            Diet Heart Room service appropriate? Yes; Fluid consistency: Thin  Diet effective now                    Consultants:  Podiatrist  Vascular surgeon  Procedures: S/p abdominal aortogram and bilateral lower extremity distal runoff on 09/19/2020.    Medications:   . aspirin  81 mg Oral Daily  . insulin aspart  0-9 Units Subcutaneous TID WC  . metoprolol succinate  25 mg Oral Daily  . rivaroxaban  20 mg Oral Q supper  . sodium chloride flush  3 mL Intravenous Q12H  . sodium chloride flush  3 mL Intravenous Q12H  .  sulfamethoxazole-trimethoprim  1 tablet Oral Q12H  . tamsulosin  0.4 mg Oral Daily  . thiamine  100 mg Oral Daily   Or  . thiamine  100 mg Intravenous Daily   Continuous Infusions: . sodium chloride Stopped (09/19/20 1130)  . sodium chloride 100 mL/hr at 09/21/20 0439  . sodium chloride       Anti-infectives (From admission, onward)   Start     Dose/Rate Route Frequency Ordered Stop   09/20/20 2200  sulfamethoxazole-trimethoprim (BACTRIM DS) 800-160 MG per tablet 1 tablet        1 tablet Oral Every 12 hours 09/20/20 1531     09/19/20 0600  ceFAZolin (ANCEF) IVPB 2g/100 mL premix        2 g 200 mL/hr over 30 Minutes Intravenous On call to O.R. 09/18/20 1839 09/19/20 1250   09/18/20 2200  sulfamethoxazole-trimethoprim (BACTRIM DS) 800-160 MG per tablet 1 tablet  Status:  Discontinued        1 tablet Oral Every 12 hours 09/18/20 1552 09/19/20 1348   09/18/20 2115  cefTRIAXone (ROCEPHIN) 2 g in sodium chloride 0.9 % 100 mL IVPB  Status:  Discontinued        2 g 200 mL/hr over 30 Minutes Intravenous Every 24 hours 09/18/20 2025 09/20/20 1531   09/17/20 2000  vancomycin (VANCOREADY) IVPB 1000 mg/200 mL  Status:  Discontinued        1,000  mg 200 mL/hr over 60 Minutes Intravenous Every 12 hours 09/17/20 1027 09/18/20 1552   09/17/20 0800  vancomycin (VANCOCIN) IVPB 1000 mg/200 mL premix  Status:  Discontinued        1,000 mg 200 mL/hr over 60 Minutes Intravenous Every 12 hours 09/16/20 1723 09/17/20 1027   09/16/20 1800  cefTRIAXone (ROCEPHIN) 1 g in sodium chloride 0.9 % 100 mL IVPB  Status:  Discontinued        1 g 200 mL/hr over 30 Minutes Intravenous Every 24 hours 09/16/20 1648 09/18/20 1552   09/16/20 1730  vancomycin (VANCOREADY) IVPB 750 mg/150 mL        750 mg 150 mL/hr over 60 Minutes Intravenous  Once 09/16/20 1723 09/16/20 2220   09/16/20 1445  ceFEPIme (MAXIPIME) 2 g in sodium chloride 0.9 % 100 mL IVPB        2 g 200 mL/hr over 30 Minutes Intravenous  Once 09/16/20 1442  09/16/20 1553   09/16/20 1445  vancomycin (VANCOCIN) IVPB 1000 mg/200 mL premix        1,000 mg 200 mL/hr over 60 Minutes Intravenous  Once 09/16/20 1442 09/16/20 1723             Family Communication/Anticipated D/C date and plan/Code Status   DVT prophylaxis:  rivaroxaban (XARELTO) tablet 20 mg     Code Status: Full Code  Family Communication: None Disposition Plan:    Status is: Inpatient  Remains inpatient appropriate because:IV treatments appropriate due to intensity of illness or inability to take PO and Inpatient level of care appropriate due to severity of illness   Dispo: The patient is from: Home              Anticipated d/c is to: Home              Patient currently is not medically stable to d/c.   Difficult to place patient No           Subjective:   Interval events noted.  No new complaints. He still has some pain in the legs although it's better.  No fever   Objective:    Vitals:   09/20/20 1621 09/20/20 2145 09/21/20 0439 09/21/20 0821  BP: 109/72 107/64 119/72 102/68  Pulse: (!) 122 (!) 113 (!) 108 (!) 109  Resp: 18 19 16 18   Temp: 98.6 F (37 C) 98.7 F (37.1 C) 98.8 F (37.1 C) 99.3 F (37.4 C)  TempSrc: Oral Oral Oral Oral  SpO2: 97% 97% 96% 96%  Weight:      Height:       No data found.   Intake/Output Summary (Last 24 hours) at 09/21/2020 1452 Last data filed at 09/21/2020 1353 Gross per 24 hour  Intake 720 ml  Output 475 ml  Net 245 ml   Filed Weights   09/16/20 1424  Weight: 76.7 kg    Exam:  GEN: NAD SKIN: Warm and dry EYES: No pallor or icterus ENT: MMM CV: RRR, tachycardic PULM: CTA B ABD: soft, ND, NT, +BS CNS: AAO x 3, non focal EXT: Bilateral legs have been wrapped.        Data Reviewed:   I have personally reviewed following labs and imaging studies:  Labs: Labs show the following:   Basic Metabolic Panel: Recent Labs  Lab 09/16/20 1501 09/17/20 0722 09/18/20 2036  NA 129* 131*  134*  K 4.2 4.5 3.9  CL 100 103 105  CO2 18* 20* 20*  GLUCOSE 82  78 109*  BUN 18 14 10   CREATININE 1.03 0.82 0.92  CALCIUM 7.9* 7.9* 7.8*   GFR Estimated Creatinine Clearance: 81.6 mL/min (by C-G formula based on SCr of 0.92 mg/dL). Liver Function Tests: Recent Labs  Lab 09/16/20 1501 09/18/20 2036  AST 17 41  ALT 14 20  ALKPHOS 80 74  BILITOT 0.5 1.3*  PROT 6.7 5.9*  ALBUMIN 2.1* 1.7*   No results for input(s): LIPASE, AMYLASE in the last 168 hours. No results for input(s): AMMONIA in the last 168 hours. Coagulation profile Recent Labs  Lab 09/16/20 1501 09/18/20 2036  INR 1.0 3.1*    CBC: Recent Labs  Lab 09/16/20 1501 09/17/20 0722 09/18/20 2036  WBC 6.6 9.6 10.9*  NEUTROABS 4.0  --  7.7  HGB 9.0* 8.7* 8.3*  HCT 28.8* 26.9* 26.2*  MCV 76.6* 74.1* 75.1*  PLT 361 372 377   Cardiac Enzymes: No results for input(s): CKTOTAL, CKMB, CKMBINDEX, TROPONINI in the last 168 hours. BNP (last 3 results) No results for input(s): PROBNP in the last 8760 hours. CBG: Recent Labs  Lab 09/20/20 1155 09/20/20 1646 09/20/20 2148 09/21/20 0818 09/21/20 1232  GLUCAP 131* 132* 114* 115* 106*   D-Dimer: No results for input(s): DDIMER in the last 72 hours. Hgb A1c: No results for input(s): HGBA1C in the last 72 hours. Lipid Profile: No results for input(s): CHOL, HDL, LDLCALC, TRIG, CHOLHDL, LDLDIRECT in the last 72 hours. Thyroid function studies: No results for input(s): TSH, T4TOTAL, T3FREE, THYROIDAB in the last 72 hours.  Invalid input(s): FREET3 Anemia work up: No results for input(s): VITAMINB12, FOLATE, FERRITIN, TIBC, IRON, RETICCTPCT in the last 72 hours. Sepsis Labs: Recent Labs  Lab 09/16/20 1501 09/16/20 1526 09/16/20 1824 09/17/20 0722 09/18/20 2036 09/18/20 2228  WBC 6.6  --   --  9.6 10.9*  --   LATICACIDVEN  --  2.0* 1.6  --  0.8 0.9    Microbiology Recent Results (from the past 240 hour(s))  Culture, blood (Routine x 2)     Status:  None   Collection Time: 09/16/20  2:23 PM   Specimen: BLOOD  Result Value Ref Range Status   Specimen Description BLOOD BLOOD LEFT FOREARM  Final   Special Requests   Final    BOTTLES DRAWN AEROBIC ONLY Blood Culture results may not be optimal due to an inadequate volume of blood received in culture bottles   Culture   Final    NO GROWTH 5 DAYS Performed at [facility], [address] [place], [place]    Report Status 09/21/2020 FINAL  Final  Resp Panel by RT-PCR (Flu A&B, Covid) Nasopharyngeal Swab     Status: None   Collection Time: 09/16/20  2:23 PM   Specimen: Nasopharyngeal Swab; Nasopharyngeal(NP) swabs in vial transport medium  Result Value Ref Range Status   SARS Coronavirus 2 by RT PCR NEGATIVE NEGATIVE Final    Comment: (NOTE) SARS-CoV-2 target nucleic acids are NOT DETECTED.  The SARS-CoV-2 RNA is generally detectable in upper respiratory specimens during the acute phase of infection. The lowest concentration of SARS-CoV-2 viral copies this assay can detect is 138 copies/mL. A negative result does not preclude SARS-Cov-2 infection and should not be used as the sole basis for treatment or other patient management decisions. A negative result may occur with  improper specimen collection/handling, submission of specimen other than nasopharyngeal swab, presence of viral mutation(s) within the areas targeted by this assay, and inadequate number of viral copies(<138  copies/mL). A negative result must be combined with clinical observations, patient history, and epidemiological information. The expected result is Negative.  Fact Sheet for Patients:  BloggerCourse.comhttps://www.fda.gov/media/152166/download  Fact Sheet for Healthcare Providers:  SeriousBroker.ithttps://www.fda.gov/media/152162/download  This test is no t yet approved or cleared by the [place] FDA and  has been authorized for detection and/or diagnosis of SARS-CoV-2 by FDA under an Emergency Use Authorization  (EUA). This EUA will remain  in effect (meaning this test can be used) for the duration of the COVID-19 declaration under Section 564(b)(1) of the Act, 21 U.S.C.section 360bbb-3(b)(1), unless the authorization is terminated  or revoked sooner.       Influenza A by PCR NEGATIVE NEGATIVE Final   Influenza B by PCR NEGATIVE NEGATIVE Final    Comment: (NOTE) The Xpert Xpress SARS-CoV-2/FLU/RSV plus assay is intended as an aid in the diagnosis of influenza from Nasopharyngeal swab specimens and should not be used as a sole basis for treatment. Nasal washings and aspirates are unacceptable for Xpert Xpress SARS-CoV-2/FLU/RSV testing.  Fact Sheet for Patients: BloggerCourse.comhttps://www.fda.gov/media/152166/download  Fact Sheet for Healthcare Providers: SeriousBroker.ithttps://www.fda.gov/media/152162/download  This test is not yet approved or cleared by the [place] FDA and has been authorized for detection and/or diagnosis of SARS-CoV-2 by FDA under an Emergency Use Authorization (EUA). This EUA will remain in effect (meaning this test can be used) for the duration of the COVID-19 declaration under Section 564(b)(1) of the Act, 21 U.S.C. section 360bbb-3(b)(1), unless the authorization is terminated or revoked.  Performed at [facility], [address] [place], [place] [address]   Culture, blood (Routine x 2)     Status: None   Collection Time: 09/16/20  3:26 PM   Specimen: BLOOD  Result Value Ref Range Status   Specimen Description BLOOD BLOOD RIGHT HAND  Final   Special Requests   Final    BOTTLES DRAWN AEROBIC AND ANAEROBIC Blood Culture results may not be optimal due to an inadequate volume of blood received in culture bottles   Culture   Final    NO GROWTH 5 DAYS Performed at [facility], [address] [place], [place] [address]    Report Status 09/21/2020 FINAL  Final  MRSA PCR Screening     Status: None   Collection Time: 09/18/20 12:55 AM   Specimen: Nasal Mucosa;  Nasopharyngeal  Result Value Ref Range Status   MRSA by PCR NEGATIVE NEGATIVE Final    Comment:        The GeneXpert MRSA Assay (FDA approved for NASAL specimens only), is one component of a comprehensive MRSA colonization surveillance program. It is not intended to diagnose MRSA infection nor to guide or monitor treatment for MRSA infections. Performed at [facility], [address] [place], [place] [address]     Procedures and diagnostic studies:  No results found.             LOS: 5 days   Guenther Dunshee  [facility]   Pager on [email or web address]. If 7PM-7AM, please contact night-coverage at [email or web address]     09/21/2020, 2:52 PM

## 2020-09-22 DIAGNOSIS — L03119 Cellulitis of unspecified part of limb: Secondary | ICD-10-CM | POA: Diagnosis not present

## 2020-09-22 DIAGNOSIS — I739 Peripheral vascular disease, unspecified: Secondary | ICD-10-CM | POA: Diagnosis not present

## 2020-09-22 LAB — CBC WITH DIFFERENTIAL/PLATELET
Abs Immature Granulocytes: 0.04 10*3/uL (ref 0.00–0.07)
Basophils Absolute: 0.1 10*3/uL (ref 0.0–0.1)
Basophils Relative: 1 %
Eosinophils Absolute: 0.7 10*3/uL — ABNORMAL HIGH (ref 0.0–0.5)
Eosinophils Relative: 7 %
HCT: 23.9 % — ABNORMAL LOW (ref 39.0–52.0)
Hemoglobin: 7.6 g/dL — ABNORMAL LOW (ref 13.0–17.0)
Immature Granulocytes: 0 %
Lymphocytes Relative: 16 %
Lymphs Abs: 1.7 10*3/uL (ref 0.7–4.0)
MCH: 24.1 pg — ABNORMAL LOW (ref 26.0–34.0)
MCHC: 31.8 g/dL (ref 30.0–36.0)
MCV: 75.9 fL — ABNORMAL LOW (ref 80.0–100.0)
Monocytes Absolute: 1.1 10*3/uL — ABNORMAL HIGH (ref 0.1–1.0)
Monocytes Relative: 11 %
Neutro Abs: 7 10*3/uL (ref 1.7–7.7)
Neutrophils Relative %: 65 %
Platelets: 366 10*3/uL (ref 150–400)
RBC: 3.15 MIL/uL — ABNORMAL LOW (ref 4.22–5.81)
RDW: 20.9 % — ABNORMAL HIGH (ref 11.5–15.5)
WBC: 10.6 10*3/uL — ABNORMAL HIGH (ref 4.0–10.5)
nRBC: 0 % (ref 0.0–0.2)

## 2020-09-22 LAB — BASIC METABOLIC PANEL
Anion gap: 5 (ref 5–15)
BUN: 8 mg/dL (ref 8–23)
CO2: 23 mmol/L (ref 22–32)
Calcium: 7.9 mg/dL — ABNORMAL LOW (ref 8.9–10.3)
Chloride: 108 mmol/L (ref 98–111)
Creatinine, Ser: 0.79 mg/dL (ref 0.61–1.24)
GFR, Estimated: >60 mL/min (ref >60)
Glucose, Bld: 82 mg/dL (ref 70–99)
Potassium: 4.3 mmol/L (ref 3.5–5.1)
Sodium: 136 mmol/L (ref 135–145)

## 2020-09-22 LAB — GLUCOSE, CAPILLARY
Glucose-Capillary: 85 mg/dL (ref 70–99)
Glucose-Capillary: 79 mg/dL (ref 70–99)

## 2020-09-22 NOTE — Progress Notes (Signed)
Pt refuses blood sugar testing per schedule,  educated on the importance of same still refuses. Not presenting with any signs of hypoglycemia, observation continues.

## 2020-09-22 NOTE — Progress Notes (Signed)
Progress Note    Brad Frost  WUJ:[id number] DOB: 01/28/1956  DOA: 09/16/2020 PCP: [name], [place], MD      Brief Narrative:    Medical records reviewed and are as summarized below:  Brad Frost is a 65 y.o. male       Assessment/Plan:   Active Problems:   PVD (peripheral vascular disease) (HCC)   Type 2 diabetes mellitus with diabetic neuropathy, without long-term current use of insulin (HCC)   Cellulitis of lower extremity   Toe ulcer, right (HCC)   Body mass index is 24.25 kg/m.   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 S/p abdominal aortogram and bilateral lower extremity distal runoff on 09/19/2020.  No plans for surgical intervention.  Type II DM with peripheral neuropathy, recent hypoglycemia: Glucose levels are stable.  Continue NovoLog as needed for hyperglycemia.    Alcohol use disorder with alcohol withdrawal syndrome.: Ativan as needed.  CIWA protocol  Chronic sinus tachycardia: Continue metoprolol.  Lactic acidosis: Improved.  Probably from metformin.  History of DVT: Continue Xarelto  BNP was normal.  2D echo was unremarkable.  No evidence of CHF at this time.  Awaiting placement to SNF.   Diet Order            Diet Heart Room service appropriate? Yes; Fluid consistency: Thin  Diet effective now                    Consultants:  Podiatrist  Vascular surgeon  Procedures: S/p abdominal aortogram and bilateral lower extremity distal runoff on 09/19/2020.    Medications:   . aspirin  81 mg Oral Daily  . insulin aspart  0-9 Units Subcutaneous TID WC  . metoprolol succinate  25 mg Oral Daily  . rivaroxaban  20 mg Oral Q supper  . sodium chloride flush  3 mL Intravenous Q12H  . sodium chloride flush  3 mL  Intravenous Q12H  . sulfamethoxazole-trimethoprim  1 tablet Oral Q12H  . tamsulosin  0.4 mg Oral Daily  . thiamine  100 mg Oral Daily   Or  . thiamine  100 mg Intravenous Daily   Continuous Infusions: . sodium chloride Stopped (09/19/20 1130)  . sodium chloride 100 mL/hr at 09/21/20 0439  . sodium chloride       Anti-infectives (From admission, onward)   Start     Dose/Rate Route Frequency Ordered Stop   09/20/20 2200  sulfamethoxazole-trimethoprim (BACTRIM DS) 800-160 MG per tablet 1 tablet        1 tablet Oral Every 12 hours 09/20/20 1531     09/19/20 0600  ceFAZolin (ANCEF) IVPB 2g/100 mL premix        2 g 200 mL/hr over 30 Minutes Intravenous On call to O.R. 09/18/20 1839 09/19/20 1250   09/18/20 2200  sulfamethoxazole-trimethoprim (BACTRIM DS) 800-160 MG per tablet 1 tablet  Status:  Discontinued        1 tablet Oral Every 12 hours 09/18/20 1552 09/19/20 1348   09/18/20 2115  cefTRIAXone (ROCEPHIN) 2 g in sodium chloride 0.9 % 100 mL IVPB  Status:  Discontinued        2 g 200 mL/hr over 30 Minutes Intravenous Every 24 hours 09/18/20 2025 09/20/20 1531   09/17/20 2000  vancomycin (VANCOREADY) IVPB 1000 mg/200 mL  Status:  Discontinued        1,000 mg 200 mL/hr over 60 Minutes Intravenous Every 12 hours 09/17/20 1027 09/18/20 1552   09/17/20 0800  vancomycin (VANCOCIN) IVPB 1000 mg/200 mL premix  Status:  Discontinued        1,000 mg 200 mL/hr over 60 Minutes Intravenous Every 12 hours 09/16/20 1723 09/17/20 1027   09/16/20 1800  cefTRIAXone (ROCEPHIN) 1 g in sodium chloride 0.9 % 100 mL IVPB  Status:  Discontinued        1 g 200 mL/hr over 30 Minutes Intravenous Every 24 hours 09/16/20 1648 09/18/20 1552   09/16/20 1730  vancomycin (VANCOREADY) IVPB 750 mg/150 mL        750 mg 150 mL/hr over 60 Minutes Intravenous  Once 09/16/20 1723 09/16/20 2220   09/16/20 1445  ceFEPIme (MAXIPIME) 2 g in sodium chloride 0.9 % 100 mL IVPB        2 g 200 mL/hr over 30 Minutes Intravenous   Once 09/16/20 1442 09/16/20 1553   09/16/20 1445  vancomycin (VANCOCIN) IVPB 1000 mg/200 mL premix        1,000 mg 200 mL/hr over 60 Minutes Intravenous  Once 09/16/20 1442 09/16/20 1723             Family Communication/Anticipated D/C date and plan/Code Status   DVT prophylaxis:  rivaroxaban (XARELTO) tablet 20 mg     Code Status: Full Code  Family Communication: None Disposition Plan:    Status is: Inpatient  Remains inpatient appropriate because:IV treatments appropriate due to intensity of illness or inability to take PO and Inpatient level of care appropriate due to severity of illness   Dispo: The patient is from: Home              Anticipated d/c is to: Home              Patient currently is not medically stable to d/c.   Difficult to place patient No           Subjective:   Interval events noted.  No new complaints. He still has some pain in the legs although it's better.  No fever   Objective:    Vitals:   09/22/20 0021 09/22/20 0502 09/22/20 0744 09/22/20 1127  BP: 107/74 109/66 103/70 99/69  Pulse: (!) 107 100 (!) 107 (!) 102  Resp: 16 18 16 16   Temp: 98.5 F (36.9 C) 98.7 F (37.1 C) 99.2 F (37.3 C) 99.6 F (37.6 C)  TempSrc: Oral Oral Oral Oral  SpO2: 97% 98% 96% 98%  Weight:      Height:       No data found.   Intake/Output Summary (Last 24 hours) at 09/22/2020 1445 Last data filed at 09/22/2020 0503 Gross per 24 hour  Intake --  Output 550 ml  Net -550 ml   Filed Weights   09/16/20 1424  Weight: 76.7 kg    Exam:  GEN: NAD SKIN: Warm and dry EYES: No pallor or icterus ENT: MMM CV: RRR, tachycardic PULM: CTA B ABD: soft, ND, NT, +BS CNS: AAO x 3, non focal EXT: Bilateral legs and feet have been wrapped        Data Reviewed:   I have personally reviewed following labs and imaging studies:  Labs: Labs show the following:   Basic Metabolic Panel: Recent Labs  Lab 09/16/20 1501 09/17/20 0722  09/18/20 2036  NA 129* 131* 134*  K 4.2 4.5 3.9  CL 100 103  105  CO2 18* 20* 20*  GLUCOSE 82 78 109*  BUN 18 14 10   CREATININE 1.03 0.82 0.92  CALCIUM 7.9* 7.9* 7.8*   GFR Estimated Creatinine Clearance: 81.6 mL/min (by C-G formula based on SCr of 0.92 mg/dL). Liver Function Tests: Recent Labs  Lab 09/16/20 1501 09/18/20 2036  AST 17 41  ALT 14 20  ALKPHOS 80 74  BILITOT 0.5 1.3*  PROT 6.7 5.9*  ALBUMIN 2.1* 1.7*   No results for input(s): LIPASE, AMYLASE in the last 168 hours. No results for input(s): AMMONIA in the last 168 hours. Coagulation profile Recent Labs  Lab 09/16/20 1501 09/18/20 2036  INR 1.0 3.1*    CBC: Recent Labs  Lab 09/16/20 1501 09/17/20 0722 09/18/20 2036  WBC 6.6 9.6 10.9*  NEUTROABS 4.0  --  7.7  HGB 9.0* 8.7* 8.3*  HCT 28.8* 26.9* 26.2*  MCV 76.6* 74.1* 75.1*  PLT 361 372 377   Cardiac Enzymes: No results for input(s): CKTOTAL, CKMB, CKMBINDEX, TROPONINI in the last 168 hours. BNP (last 3 results) No results for input(s): PROBNP in the last 8760 hours. CBG: Recent Labs  Lab 09/21/20 0818 09/21/20 1232 09/21/20 1643 09/21/20 2114 09/22/20 0745  GLUCAP 115* 106* 100* 130* 85   D-Dimer: No results for input(s): DDIMER in the last 72 hours. Hgb A1c: No results for input(s): HGBA1C in the last 72 hours. Lipid Profile: No results for input(s): CHOL, HDL, LDLCALC, TRIG, CHOLHDL, LDLDIRECT in the last 72 hours. Thyroid function studies: No results for input(s): TSH, T4TOTAL, T3FREE, THYROIDAB in the last 72 hours.  Invalid input(s): FREET3 Anemia work up: No results for input(s): VITAMINB12, FOLATE, FERRITIN, TIBC, IRON, RETICCTPCT in the last 72 hours. Sepsis Labs: Recent Labs  Lab 09/16/20 1501 09/16/20 1526 09/16/20 1824 09/17/20 0722 09/18/20 2036 09/18/20 2228  WBC 6.6  --   --  9.6 10.9*  --   LATICACIDVEN  --  2.0* 1.6  --  0.8 0.9    Microbiology Recent Results (from the past 240 hour(s))  Culture, blood  (Routine x 2)     Status: None   Collection Time: 09/16/20  2:23 PM   Specimen: BLOOD  Result Value Ref Range Status   Specimen Description BLOOD BLOOD LEFT FOREARM  Final   Special Requests   Final    BOTTLES DRAWN AEROBIC ONLY Blood Culture results may not be optimal due to an inadequate volume of blood received in culture bottles   Culture   Final    NO GROWTH 5 DAYS Performed at [facility], [address] [place], [place]    Report Status 09/21/2020 FINAL  Final  Resp Panel by RT-PCR (Flu A&B, Covid) Nasopharyngeal Swab     Status: None   Collection Time: 09/16/20  2:23 PM   Specimen: Nasopharyngeal Swab; Nasopharyngeal(NP) swabs in vial transport medium  Result Value Ref Range Status   SARS Coronavirus 2 by RT PCR NEGATIVE NEGATIVE Final    Comment: (NOTE) SARS-CoV-2 target nucleic acids are NOT DETECTED.  The SARS-CoV-2 RNA is generally detectable in upper respiratory specimens during the acute phase of infection. The lowest concentration of SARS-CoV-2 viral copies this assay can detect is 138 copies/mL. A negative result does not preclude SARS-Cov-2 infection and should not be used as the sole basis for treatment or other patient management decisions. A negative result may occur with  improper specimen collection/handling, submission of specimen other than nasopharyngeal swab, presence of viral mutation(s) within the areas targeted  by this assay, and inadequate number of viral copies(<138 copies/mL). A negative result must be combined with clinical observations, patient history, and epidemiological information. The expected result is Negative.  Fact Sheet for Patients:  BloggerCourse.com  Fact Sheet for Healthcare Providers:  SeriousBroker.it  This test is no t yet approved or cleared by the [place] FDA and  has been authorized for detection and/or diagnosis of SARS-CoV-2 by FDA under an  Emergency Use Authorization (EUA). This EUA will remain  in effect (meaning this test can be used) for the duration of the COVID-19 declaration under Section 564(b)(1) of the Act, 21 U.S.C.section 360bbb-3(b)(1), unless the authorization is terminated  or revoked sooner.       Influenza A by PCR NEGATIVE NEGATIVE Final   Influenza B by PCR NEGATIVE NEGATIVE Final    Comment: (NOTE) The Xpert Xpress SARS-CoV-2/FLU/RSV plus assay is intended as an aid in the diagnosis of influenza from Nasopharyngeal swab specimens and should not be used as a sole basis for treatment. Nasal washings and aspirates are unacceptable for Xpert Xpress SARS-CoV-2/FLU/RSV testing.  Fact Sheet for Patients: BloggerCourse.com  Fact Sheet for Healthcare Providers: SeriousBroker.it  This test is not yet approved or cleared by the [place] FDA and has been authorized for detection and/or diagnosis of SARS-CoV-2 by FDA under an Emergency Use Authorization (EUA). This EUA will remain in effect (meaning this test can be used) for the duration of the COVID-19 declaration under Section 564(b)(1) of the Act, 21 U.S.C. section 360bbb-3(b)(1), unless the authorization is terminated or revoked.  Performed at [facility], [address] [place], [place] [address]   Culture, blood (Routine x 2)     Status: None   Collection Time: 09/16/20  3:26 PM   Specimen: BLOOD  Result Value Ref Range Status   Specimen Description BLOOD BLOOD RIGHT HAND  Final   Special Requests   Final    BOTTLES DRAWN AEROBIC AND ANAEROBIC Blood Culture results may not be optimal due to an inadequate volume of blood received in culture bottles   Culture   Final    NO GROWTH 5 DAYS Performed at [facility], [address] [place], [place] [address]    Report Status 09/21/2020 FINAL  Final  MRSA PCR Screening     Status: None   Collection Time: 09/18/20 12:55 AM    Specimen: Nasal Mucosa; Nasopharyngeal  Result Value Ref Range Status   MRSA by PCR NEGATIVE NEGATIVE Final    Comment:        The GeneXpert MRSA Assay (FDA approved for NASAL specimens only), is one component of a comprehensive MRSA colonization surveillance program. It is not intended to diagnose MRSA infection nor to guide or monitor treatment for MRSA infections. Performed at [facility], [address] [place], [place] [address]     Procedures and diagnostic studies:  No results found.             LOS: 6 days   Hong Moring  [facility]   Pager on [email or web address]. If 7PM-7AM, please contact night-coverage at [email or web address]     09/22/2020, 2:45 PM

## 2020-09-22 NOTE — TOC Progression Note (Signed)
Transition of Care [facility]) - Progression Note    Patient Details  Name: Brad Frost MRN: [id number] Date of Birth: [birth year]  Transition [facility] CM/SW Peggs, RN Phone Number: 09/22/2020, 2:08 PM  Clinical Narrative:   Met with the patient to discuss DC plan, PASSR Obtained, [place] sent, he wants to go to [facility], I reached out to request a bed, He has not had but 1 covid vaccine and does not want to get more        Expected Discharge Plan and Services                                                 Social Determinants of Health (SDOH) Interventions    Readmission Risk Interventions No flowsheet data found.

## 2020-09-23 DIAGNOSIS — L03119 Cellulitis of unspecified part of limb: Secondary | ICD-10-CM | POA: Diagnosis not present

## 2020-09-23 DIAGNOSIS — I739 Peripheral vascular disease, unspecified: Secondary | ICD-10-CM | POA: Diagnosis not present

## 2020-09-23 DIAGNOSIS — L97511 Non-pressure chronic ulcer of other part of right foot limited to breakdown of skin: Secondary | ICD-10-CM | POA: Diagnosis not present

## 2020-09-23 LAB — CBC WITH DIFFERENTIAL/PLATELET
Abs Immature Granulocytes: 0.05 10*3/uL (ref 0.00–0.07)
Basophils Absolute: 0.1 10*3/uL (ref 0.0–0.1)
Basophils Relative: 1 %
Eosinophils Absolute: 0.6 10*3/uL — ABNORMAL HIGH (ref 0.0–0.5)
Eosinophils Relative: 6 %
HCT: 25 % — ABNORMAL LOW (ref 39.0–52.0)
Hemoglobin: 7.9 g/dL — ABNORMAL LOW (ref 13.0–17.0)
Immature Granulocytes: 1 %
Lymphocytes Relative: 14 %
Lymphs Abs: 1.4 10*3/uL (ref 0.7–4.0)
MCH: 23.9 pg — ABNORMAL LOW (ref 26.0–34.0)
MCHC: 31.6 g/dL (ref 30.0–36.0)
MCV: 75.8 fL — ABNORMAL LOW (ref 80.0–100.0)
Monocytes Absolute: 0.9 10*3/uL (ref 0.1–1.0)
Monocytes Relative: 9 %
Neutro Abs: 7 10*3/uL (ref 1.7–7.7)
Neutrophils Relative %: 69 %
Platelets: 357 10*3/uL (ref 150–400)
RBC: 3.3 MIL/uL — ABNORMAL LOW (ref 4.22–5.81)
RDW: 20.9 % — ABNORMAL HIGH (ref 11.5–15.5)
WBC: 10 10*3/uL (ref 4.0–10.5)
nRBC: 0 % (ref 0.0–0.2)

## 2020-09-23 LAB — TYPE AND SCREEN
ABO/RH(D): O POS
Antibody Screen: NEGATIVE

## 2020-09-23 LAB — RESP PANEL BY RT-PCR (FLU A&B, COVID) ARPGX2
Influenza A by PCR: NEGATIVE
Influenza B by PCR: NEGATIVE
SARS Coronavirus 2 by RT PCR: NEGATIVE

## 2020-09-23 MED ORDER — POLYETHYLENE GLYCOL 3350 17 G PO PACK
17.0000 g | PACK | Freq: Every day | ORAL | Status: DC
  Administered 2020-09-23: 17 g via ORAL
  Filled 2020-09-23: qty 1

## 2020-09-23 MED ORDER — SULFAMETHOXAZOLE-TRIMETHOPRIM 800-160 MG PO TABS: 1.0000 | ORAL_TABLET | Freq: Two times a day (BID) | ORAL | Status: AC

## 2020-09-23 MED ORDER — SODIUM CHLORIDE 0.9 % IV SOLN
200.0000 mg | Freq: Once | INTRAVENOUS | Status: AC
  Administered 2020-09-23: 200 mg via INTRAVENOUS
  Filled 2020-09-23: qty 10

## 2020-09-23 MED ORDER — BISACODYL 10 MG RE SUPP: 10.0000 mg | Freq: Every day | RECTAL | Status: DC | PRN

## 2020-09-23 MED ORDER — ACETAMINOPHEN 325 MG PO TABS: 650.0000 mg | ORAL_TABLET | ORAL | Status: AC | PRN

## 2020-09-23 NOTE — TOC Progression Note (Addendum)
Transition of Care [facility]) - Progression Note    Patient Details  Name: Brad Frost MRN: [id number] Date of Birth: 02-06-1956  [facility] of Care [facility]) CM/SW Contact  Barrie Dunker, RN Phone Number: 09/23/2020, 1:37 PM  Clinical Narrative:    Spoke with the patient and he verified that he has regular Medicare and Medicaid, he requested that I call his brother [name] to have him bring his wallet with his Medicare card in it, the patient will be going to room 25 A and report can be called to [phone], Notified the patient about the room number and he will call his family, Notified the Dr and the nurse, the patient wants to transport in EMS and understands that if insurance does not cover he will be responsible for the bill       Expected Discharge Plan and Services                                                 Social Determinants of Health (SDOH) Interventions    Readmission Risk Interventions No flowsheet data found.

## 2020-09-23 NOTE — Progress Notes (Signed)
Patient able to make needs known. Scheduled to discharge to [facility], SNF for continued skilled care room 25A. Called facility, [phone], to give report and was placed on hold for 10 mins before having to disconnect. Called back and after being on hold for 8 mins, the line was picked up and disconnected. AVS has been placed in chart.

## 2020-09-23 NOTE — Progress Notes (Signed)
The pt has refused lab this morning , twice. He made a deal with RN, that he will have them done between 3 and 4 pm.

## 2020-09-23 NOTE — Progress Notes (Signed)
Patient has refused blood sugar finger stick for breakfast, lunch and dinner. Patient has been educated on importance of monitoring and controlling blood sugars and still declines to have done.

## 2020-09-23 NOTE — Discharge Summary (Signed)
Physician Discharge Summary  Brad Frost XBM:[id number] DOB: 1956/01/17 DOA: 09/16/2020  PCP: [name], [name], MD  Admit date: 09/16/2020 Discharge date: 09/23/2020  Discharge disposition: Skilled nursing facility   Recommendations for Outpatient Follow-Up:   Follow-up with the wound care clinic within 1 to 2 weeks of discharge. Follow-up with physician at the nursing home within 3 days of discharge.   Discharge Diagnosis:   Active Problems:   PVD (peripheral vascular disease) (HCC)   Type 2 diabetes mellitus with diabetic neuropathy, without long-term current use of insulin (HCC)   Cellulitis of lower extremity   Toe ulcer, right (HCC)    Discharge Condition: Stable.  Diet recommendation:  Diet Order            Diet - low sodium heart healthy           Diet Heart Room service appropriate? Yes; Fluid consistency: Thin  Diet effective now                   Code Status: Full Code     Hospital Course:   Mr. Brad Frost is a 65 year old man with medical history significant for type II DM, hypertension, PVD, alcohol use disorder, history of DVT on Xarelto, chronic lymphedema bilateral legs, chronic sinus tachycardia. He presented to the hospital because of wounds on bilateral legs with maggots infestation.  He reported increasing pain and swelling of bilateral legs.  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  He was treated with empiric IV antibiotics for probable bilateral leg cellulitis.  Podiatrist and vascular surgeon were consulted to assist with management.  He underwent abdominal aortogram and bilateral lower extremity distal runoff.  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  He received  local wound care and podiatrist recommended conservative management with antibiotics.  Follow-up with the wound care clinic is strongly recommended.  He was also treated with Ativan for alcohol withdrawal syndrome.  He has been advised to stop drinking alcohol.  He was evaluated by PT and OT recommended discharge to SNF.  His condition has improved and he is deemed stable for discharge to SNF today.   Medical Consultants:    Podiatrist  Vascular surgeon   Discharge Exam:    Vitals:   09/22/20 1955 09/23/20 0431 09/23/20 0751 09/23/20 1147  BP:  (!) 101/52 107/63 (!) 91/54  Pulse: (!) 110 (!) 102 (!) 103 100  Resp: Temp: 99 F (37.2 C) 98.8 F (37.1 C) 99.3 F (37.4 C) 99.8 F (37.7 C)  TempSrc: Oral Oral Oral Oral  SpO2: 96% 96% 96% 96%  Weight:      Height:         GEN: NAD SKIN: Warm and dry. EYES: EOMI ENT: MMM CV: RRR PULM: CTA B ABD: soft, ND, NT, +BS CNS: AAO x 3, non focal EXT: Chronic bilateral leg lymphedema.  Right hallux ulcer    The results of significant diagnostics from this hospitalization (including imaging, microbiology, ancillary and laboratory) are listed below for reference.     Procedures and Diagnostic Studies:   DG Chest Port 1 View  Result Date: 09/16/2020 CLINICAL DATA:  Bilateral leg infections. EXAM: PORTABLE CHEST 1 VIEW COMPARISON:  Chest x-ray 04/12/2019. FINDINGS: Mediastinum hilar structures normal. Low  lung volumes. Mild left base atelectasis/scarring. Mild left base infiltrate cannot be excluded. Stable elevation left hemidiaphragm. No pleural effusion or pneumothorax. IMPRESSION: Mild left base atelectasis/scarring. Mild left base infiltrate cannot be excluded. Stable elevation left hemidiaphragm. Electronically Signed   By: Maisie Fus  Register   On: 09/16/2020 15:31   DG Foot 2 Views Right  Result Date: 09/16/2020 CLINICAL DATA:  ?Osteomyelitis EXAM: RIGHT FOOT - 2 VIEW COMPARISON:  None. FINDINGS: Diffuse  osteopenia. There is diffuse soft tissue swelling with possible wound at the lateral hindfoot. There is no evidence of acute fracture. There is mild first MTP degenerative change in additional scattered interphalangeal joint degenerative change. Vascular calcifications. IMPRESSION: No radiographic evidence of osteomyelitis. MRI would be more sensitive. Diffuse soft tissue swelling with possible wound along the lateral hindfoot. Diffuse osteopenia.  Mild first MTP degenerative arthritis. Electronically Signed   By: Caprice Renshaw   On: 09/16/2020 15:32   ECHOCARDIOGRAM COMPLETE  Result Date: 09/17/2020    ECHOCARDIOGRAM REPORT   Patient Name:   Brad Frost [facility] Date of Exam: 09/16/2020 Medical Rec #:  [id number]         Height:       70.0 in Accession #:    [id number]        Weight:       169.0 lb Date of Birth:  Apr 28, 1955          BSA:          1.943 m Patient Age:    65 years          BP:           116/72 mmHg Patient Gender: M                 HR:           126 bpm. Exam Location:  [facility] Procedure: 2D Echo, Cardiac Doppler and Color Doppler Indications:     I50.31 Acute Diastolic CHF  History:         Patient has prior history of Echocardiogram examinations, most                  recent 04/13/2019. Risk Factors:Hypertension and Diabetes.                  Peripheral vascular disease.  Sonographer:     Sedonia Small Rodgers-Jones Referring Phys:  [id number] Marrion Coy Diagnosing Phys: Arnoldo Hooker MD IMPRESSIONS  1. Left ventricular ejection fraction, by estimation, is 65 to 70%. The left ventricle has normal function. The left ventricle has no regional wall motion abnormalities. Left ventricular diastolic parameters were normal.  2. Right ventricular systolic function is normal. The right ventricular size is normal.  3. The mitral valve is normal in structure. Trivial mitral valve regurgitation.  4. The aortic valve is normal in structure. Aortic valve regurgitation is trivial. FINDINGS  Left Ventricle: Left ventricular  ejection fraction, by estimation, is 65 to 70%. The left ventricle has normal function. The left ventricle has no regional wall motion abnormalities. The left ventricular internal cavity size was normal in size. There is  no left ventricular hypertrophy. Left ventricular diastolic parameters were normal. Right Ventricle: The right ventricular size is normal. No increase in right ventricular wall thickness. Right ventricular systolic function is normal. Left Atrium: Left atrial size was normal in size. Right Atrium: Right atrial size was normal in size. Pericardium: There is no evidence of pericardial effusion. Mitral Valve: The mitral valve is normal in structure.  Trivial mitral valve regurgitation. Tricuspid Valve: The tricuspid valve is normal in structure. Tricuspid valve regurgitation is trivial. Aortic Valve: The aortic valve is normal in structure. Aortic valve regurgitation is trivial. Aortic valve mean gradient measures 10.0 mmHg. Aortic valve peak gradient measures 15.4 mmHg. Aortic valve area, by VTI measures 2.17 cm. Pulmonic Valve: The pulmonic valve was normal in structure. Pulmonic valve regurgitation is not visualized. Aorta: The aortic root and ascending aorta are structurally normal, with no evidence of dilitation. IAS/Shunts: No atrial level shunt detected by color flow Doppler.  LEFT VENTRICLE PLAX 2D LVIDd:         3.65 cm  Diastology LVIDs:         2.69 cm  LV e' medial:    10.10 cm/s LV PW:         0.92 cm  LV E/e' medial:  8.3 LV IVS:        1.00 cm  LV e' lateral:   12.30 cm/s LVOT diam:     2.00 cm  LV E/e' lateral: 6.8 LV SV:         55 LV SV Index:   28 LVOT Area:     3.14 cm  RIGHT VENTRICLE RV Basal diam:  3.37 cm RV S prime:     23.60 cm/s TAPSE (M-mode): 2.5 cm LEFT ATRIUM             Index       RIGHT ATRIUM           Index LA diam:        4.10 cm 2.11 cm/m  RA Area:     11.20 cm LA Vol (A2C):   41.7 ml 21.46 ml/m RA Volume:   24.90 ml  12.81 ml/m LA Vol (A4C):   26.7 ml 13.74  ml/m LA Biplane Vol: 35.8 ml 18.42 ml/m  AORTIC VALVE AV Area (Vmax):    1.73 cm AV Area (Vmean):   1.83 cm AV Area (VTI):     2.17 cm AV Vmax:           196.00 cm/s AV Vmean:          148.000 cm/s AV VTI:            0.254 m AV Peak Grad:      15.4 mmHg AV Mean Grad:      10.0 mmHg LVOT Vmax:         108.00 cm/s LVOT Vmean:        86.100 cm/s LVOT VTI:          0.176 m LVOT/AV VTI ratio: 0.69  AORTA Ao Root diam: 3.10 cm MITRAL VALVE                TRICUSPID VALVE MV Area (PHT): 3.85 cm     TR Peak grad:   22.3 mmHg MV Decel Time: 197 msec     TR Vmax:        236.00 cm/s MV E velocity: 83.40 cm/s MV A velocity: 107.00 cm/s  SHUNTS MV E/A ratio:  0.78         Systemic VTI:  0.18 m                             Systemic Diam: 2.00 cm Arnoldo HookerBruce Kowalski MD Electronically signed by Arnoldo HookerBruce Kowalski MD Signature Date/Time: 09/17/2020/12:36:11 PM    Final      Labs:   Basic Metabolic  Panel: Recent Labs  Lab 09/16/20 1501 09/17/20 0722 09/18/20 2036 09/22/20 1651  NA 129* 131* 134* 136  K 4.2 4.5 3.9 4.3  CL 100 103 105 108  CO2 18* 20* 20* 23  GLUCOSE 82 78 109* 82  BUN 18 14 10 8   CREATININE 1.03 0.82 0.92 0.79  CALCIUM 7.9* 7.9* 7.8* 7.9*   GFR Estimated Creatinine Clearance: 93.8 mL/min (by C-G formula based on SCr of 0.79 mg/dL). Liver Function Tests: Recent Labs  Lab 09/16/20 1501 09/18/20 2036  AST 17 41  ALT 14 20  ALKPHOS 80 74  BILITOT 0.5 1.3*  PROT 6.7 5.9*  ALBUMIN 2.1* 1.7*   No results for input(s): LIPASE, AMYLASE in the last 168 hours. No results for input(s): AMMONIA in the last 168 hours. Coagulation profile Recent Labs  Lab 09/16/20 1501 09/18/20 2036  INR 1.0 3.1*    CBC: Recent Labs  Lab 09/16/20 1501 09/17/20 0722 09/18/20 2036 09/22/20 1651 09/23/20 0952  WBC 6.6 9.6 10.9* 10.6* 10.0  NEUTROABS 4.0  --  7.7 7.0 7.0  HGB 9.0* 8.7* 8.3* 7.6* 7.9*  HCT 28.8* 26.9* 26.2* 23.9* 25.0*  MCV 76.6* 74.1* 75.1* 75.9* 75.8*  PLT 361 372 377 366 357    Cardiac Enzymes: No results for input(s): CKTOTAL, CKMB, CKMBINDEX, TROPONINI in the last 168 hours. BNP: Invalid input(s): POCBNP CBG: Recent Labs  Lab 09/21/20 1232 09/21/20 1643 09/21/20 2114 09/22/20 0745 09/22/20 1647  GLUCAP 106* 100* 130* 85 79   D-Dimer No results for input(s): DDIMER in the last 72 hours. Hgb A1c No results for input(s): HGBA1C in the last 72 hours. Lipid Profile No results for input(s): CHOL, HDL, LDLCALC, TRIG, CHOLHDL, LDLDIRECT in the last 72 hours. Thyroid function studies No results for input(s): TSH, T4TOTAL, T3FREE, THYROIDAB in the last 72 hours.  Invalid input(s): FREET3 Anemia work up No results for input(s): VITAMINB12, FOLATE, FERRITIN, TIBC, IRON, RETICCTPCT in the last 72 hours. Microbiology Recent Results (from the past 240 hour(s))  Culture, blood (Routine x 2)     Status: None   Collection Time: 09/16/20  2:23 PM   Specimen: BLOOD  Result Value Ref Range Status   Specimen Description BLOOD BLOOD LEFT FOREARM  Final   Special Requests   Final    BOTTLES DRAWN AEROBIC ONLY Blood Culture results may not be optimal due to an inadequate volume of blood received in culture bottles   Culture   Final    NO GROWTH 5 DAYS Performed at [facility], [address] [place], [place]    Report Status 09/21/2020 FINAL  Final  Resp Panel by RT-PCR (Flu A&B, Covid) Nasopharyngeal Swab     Status: None   Collection Time: 09/16/20  2:23 PM   Specimen: Nasopharyngeal Swab; Nasopharyngeal(NP) swabs in vial transport medium  Result Value Ref Range Status   SARS Coronavirus 2 by RT PCR NEGATIVE NEGATIVE Final    Comment: (NOTE) SARS-CoV-2 target nucleic acids are NOT DETECTED.  The SARS-CoV-2 RNA is generally detectable in upper respiratory specimens during the acute phase of infection. The lowest concentration of SARS-CoV-2 viral copies this assay can detect is 138 copies/mL. A negative result does not preclude  SARS-Cov-2 infection and should not be used as the sole basis for treatment or other patient management decisions. A negative result may occur with  improper specimen collection/handling, submission of specimen other than nasopharyngeal swab, presence of viral mutation(s) within the areas targeted by this assay, and inadequate  number of viral copies(<138 copies/mL). A negative result must be combined with clinical observations, patient history, and epidemiological information. The expected result is Negative.  Fact Sheet for Patients:  BloggerCourse.com  Fact Sheet for Healthcare Providers:  SeriousBroker.it  This test is no t yet approved or cleared by the [place] FDA and  has been authorized for detection and/or diagnosis of SARS-CoV-2 by FDA under an Emergency Use Authorization (EUA). This EUA will remain  in effect (meaning this test can be used) for the duration of the COVID-19 declaration under Section 564(b)(1) of the Act, 21 U.S.C.section 360bbb-3(b)(1), unless the authorization is terminated  or revoked sooner.       Influenza A by PCR NEGATIVE NEGATIVE Final   Influenza B by PCR NEGATIVE NEGATIVE Final    Comment: (NOTE) The Xpert Xpress SARS-CoV-2/FLU/RSV plus assay is intended as an aid in the diagnosis of influenza from Nasopharyngeal swab specimens and should not be used as a sole basis for treatment. Nasal washings and aspirates are unacceptable for Xpert Xpress SARS-CoV-2/FLU/RSV testing.  Fact Sheet for Patients: BloggerCourse.com  Fact Sheet for Healthcare Providers: SeriousBroker.it  This test is not yet approved or cleared by the [place] FDA and has been authorized for detection and/or diagnosis of SARS-CoV-2 by FDA under an Emergency Use Authorization (EUA). This EUA will remain in effect (meaning this test can be used) for the duration of  the COVID-19 declaration under Section 564(b)(1) of the Act, 21 U.S.C. section 360bbb-3(b)(1), unless the authorization is terminated or revoked.  Performed at [facility], [address] [place], [place] [address]   Culture, blood (Routine x 2)     Status: None   Collection Time: 09/16/20  3:26 PM   Specimen: BLOOD  Result Value Ref Range Status   Specimen Description BLOOD BLOOD RIGHT HAND  Final   Special Requests   Final    BOTTLES DRAWN AEROBIC AND ANAEROBIC Blood Culture results may not be optimal due to an inadequate volume of blood received in culture bottles   Culture   Final    NO GROWTH 5 DAYS Performed at [facility], [address] [place], [place] [address]    Report Status 09/21/2020 FINAL  Final  MRSA PCR Screening     Status: None   Collection Time: 09/18/20 12:55 AM   Specimen: Nasal Mucosa; Nasopharyngeal  Result Value Ref Range Status   MRSA by PCR NEGATIVE NEGATIVE Final    Comment:        The GeneXpert MRSA Assay (FDA approved for NASAL specimens only), is one component of a comprehensive MRSA colonization surveillance program. It is not intended to diagnose MRSA infection nor to guide or monitor treatment for MRSA infections. Performed at [facility], [address] [place], [place] [address]      Discharge Instructions:   Discharge Instructions    Ambulatory referral to Wound Clinic   Complete by: As directed    Follow up within  1 to 2 weeks of discharge   Diet - low sodium heart healthy   Complete by: As directed    Discharge wound care:   Complete by: As directed    AFTER LEG HAVE BEEN SCRUBBED WITH SALINE   Change daily or PRN saturation/strikethrough.  Apply silver alginate to any open wounds (if not available then use xeroform sheets), top with 4x4, ABD, kerlix,  4" coban from base of toes to the patellar notch with slight stretch for compression.  Apply betadine  gauze to the right 1st and 2nd  toes and wrap lightly with 4x4 gauze, kerlix and incorporate into dressing as shown above   Increase activity slowly   Complete by: As directed      Allergies as of 09/23/2020   No Known Allergies     Medication List    STOP taking these medications   metFORMIN 500 MG tablet Commonly known as: GLUCOPHAGE     TAKE these medications   acetaminophen 325 MG tablet Commonly known as: TYLENOL Take 2 tablets (650 mg total) by mouth every 4 (four) hours as needed for headache or mild pain.   aspirin 81 MG tablet Take 81 mg by mouth daily.   metoprolol succinate 25 MG 24 hr tablet Commonly known as: TOPROL-XL Take 1 tablet (25 mg total) by mouth daily.   rivaroxaban 20 MG Tabs tablet Commonly known as: XARELTO Take 1 tablet (20 mg total) by mouth daily with supper. Starting from January 1 after finishing twice daily dose.   sulfamethoxazole-trimethoprim 800-160 MG tablet Commonly known as: BACTRIM DS Take 1 tablet by mouth every 12 (twelve) hours for 3 days.   tamsulosin 0.4 MG Caps capsule Commonly known as: FLOMAX Take 0.4 mg by mouth daily.            Discharge Care Instructions  (From admission, onward)         Start     Ordered   09/23/20 0000  Discharge wound care:       Comments: AFTER LEG HAVE BEEN SCRUBBED WITH SALINE   Change daily or PRN saturation/strikethrough.  Apply silver alginate to any open wounds (if not available then use xeroform sheets), top with 4x4, ABD, kerlix,  4" coban from base of toes to the patellar notch with slight stretch for compression.  Apply betadine gauze to the right 1st and 2nd toes and wrap lightly with 4x4 gauze, kerlix and incorporate into dressing as shown above   09/23/20 1412          Contact information for follow-up providers    [facility]. Schedule an appointment as soon as possible for a visit.   Specialty: Wound Care Contact information: [address] [id number] ar [phone]           Contact information for after-discharge care    Destination    [facility] CARE Preferred SNF .   Service: Skilled Nursing Contact information: [address] [place] [address] [phone]                   Time coordinating discharge: 34 minutes  Signed:  Lurene Shadow  [facility] 09/23/2020, 2:12 PM   Pager on [email or web address]. If 7PM-7AM, please contact night-coverage at [email or web address]

## 2020-09-23 NOTE — NC FL2 (Signed)
[place] MEDICAID FL2 LEVEL OF CARE SCREENING TOOL     IDENTIFICATION  Patient Name: Brad Frost Birthdate: 09/21/1955 Sex: male Admission Date (Current Location): 09/16/2020  [place] and [place] Number:  Chiropodist and Address:  [facility], [address] [place], [place] [address]      Provider Number: [id number]  Attending Physician Name and Address:  Lurene Shadow, MD  Relative Name and Phone Number:  Kenzie, [employer] banker, corporate)   [phone] [facility] Phone)    Current Level of Care:   Recommended Level of Care: Skilled Nursing Facility Prior Approval Number:    Date Approved/Denied:   PASRR Number: [id number]  Discharge Plan: SNF    Current Diagnoses: Patient Active Problem List   Diagnosis Date Noted  . Toe ulcer, right (HCC) 09/17/2020  . Cellulitis of lower extremity 09/16/2020  . COVID-19 04/13/2019  . Hypotension   . COVID-19 virus infection 04/04/2019  . Pneumonia due to COVID-19 virus   . Bilateral pulmonary embolism (HCC)   . Lung mass   . AKI (acute kidney injury) (HCC)   . Type 2 diabetes mellitus with diabetic neuropathy, without long-term current use of insulin (HCC)   . PVD (peripheral vascular disease) (HCC) 02/14/2015    Orientation RESPIRATION BLADDER Height & Weight     Self,Time,Situation,Place  Normal Continent,External catheter Weight: 76.7 kg Height:  5\' 10"  (177.8 cm)  BEHAVIORAL SYMPTOMS/MOOD NEUROLOGICAL BOWEL NUTRITION STATUS      Continent Diet (Heart; Dietitian to order nutritional supplements as indicated per patient diet order and condition)  AMBULATORY STATUS COMMUNICATION OF NEEDS Skin   Extensive Assist Verbally Other (Comment) (Toe dehisencse Left Anterior; L pretibial blisters, dehis pre tibial right)                       Personal Care Assistance Level of Assistance  Bathing,Feeding,Dressing Bathing Assistance: Limited assistance Feeding assistance: Limited  assistance Dressing Assistance: Maximum assistance     Functional Limitations Info  Sight,Hearing,Speech Sight Info: Adequate Hearing Info: Adequate Speech Info: Adequate    SPECIAL CARE FACTORS FREQUENCY  PT (By licensed PT),OT (By licensed OT)     PT Frequency: min 5x weekly OT Frequency: min 5x weekly            Contractures Contractures Info: Not present    Additional Factors Info  Code Status,Allergies Code Status Info: FULL CODE Allergies Info: NO Known Allergies           Current Medications (09/23/2020):  This is the current hospital active medication list Current Facility-Administered Medications  Medication Dose Route Frequency Provider Last Rate Last Admin  . 0.9 %  sodium chloride infusion  250 mL Intravenous PRN Schnier, 09/25/2020, MD   Stopped at 09/19/20 1130  . 0.9 %  sodium chloride infusion   Intravenous Continuous Schnier, 09/21/20, MD 100 mL/hr at 09/23/20 0347 New Bag at 09/23/20 0347  . 0.9 %  sodium chloride infusion  250 mL Intravenous PRN Schnier, 09/25/20, MD      . acetaminophen (TYLENOL) tablet 650 mg  650 mg Oral Q4H PRN Latina Craver Gilda Crease, MD   650 mg at 09/20/20 2123  . aspirin chewable tablet 81 mg  81 mg Oral Daily Schnier, 2124, MD   81 mg at 09/23/20 1000  . bisacodyl (DULCOLAX) suppository 10 mg  10 mg Rectal Daily PRN 09/25/20, MD      . ibuprofen (ADVIL) tablet 400 mg  400 mg  Oral Q4H PRN Gilda Crease Latina Craver, MD   400 mg at 09/20/20 1643  . insulin aspart (novoLOG) injection 0-9 Units  0-9 Units Subcutaneous TID WC Schnier, Latina Craver, MD      . iron sucrose (VENOFER) 200 mg in sodium chloride 0.9 % 100 mL IVPB  200 mg Intravenous Once Lurene Shadow, MD      . metoprolol succinate (TOPROL-XL) 24 hr tablet 25 mg  25 mg Oral Daily Schnier, Latina Craver, MD   25 mg at 09/23/20 1000  . morphine 4 MG/ML injection 2 mg  2 mg Intravenous Q1H PRN Schnier, Latina Craver, MD      . oxyCODONE (Oxy IR/ROXICODONE) immediate release tablet  5-10 mg  5-10 mg Oral Q4H PRN Schnier, Latina Craver, MD   5 mg at 09/21/20 2129  . polyethylene glycol (MIRALAX / GLYCOLAX) packet 17 g  17 g Oral Daily Lurene Shadow, MD      . rivaroxaban Carlena Hurl) tablet 20 mg  20 mg Oral Q supper Schnier, Latina Craver, MD   20 mg at 09/22/20 1936  . sodium chloride flush (NS) 0.9 % injection 3 mL  3 mL Intravenous Q12H Schnier, Latina Craver, MD   3 mL at 09/21/20 1048  . sodium chloride flush (NS) 0.9 % injection 3 mL  3 mL Intravenous PRN Schnier, Latina Craver, MD      . sodium chloride flush (NS) 0.9 % injection 3 mL  3 mL Intravenous Q12H Schnier, Latina Craver, MD   3 mL at 09/22/20 2310  . sodium chloride flush (NS) 0.9 % injection 3 mL  3 mL Intravenous PRN Schnier, Latina Craver, MD   3 mL at 09/22/20 2311  . sulfamethoxazole-trimethoprim (BACTRIM DS) 800-160 MG per tablet 1 tablet  1 tablet Oral Q12H Lurene Shadow, MD   1 tablet at 09/23/20 1000  . tamsulosin (FLOMAX) capsule 0.4 mg  0.4 mg Oral Daily Schnier, Latina Craver, MD   0.4 mg at 09/23/20 1000  . thiamine tablet 100 mg  100 mg Oral Daily Schnier, Latina Craver, MD   100 mg at 09/23/20 1000   Or  . thiamine (B-1) injection 100 mg  100 mg Intravenous Daily Schnier, Latina Craver, MD         Discharge Medications: Please see discharge summary for a list of discharge medications.  Relevant Imaging Results:  Relevant Lab Results:   Additional Information [id number]  Barrie Dunker, RN

## 2020-09-23 NOTE — TOC Progression Note (Signed)
Transition of Care [facility]) - Progression Note    Patient Details  Name: Brad Frost MRN: [id number] Date of Birth: 1956/04/16  Transition of Care [facility]) CM/SW Contact  Barrie Dunker, RN Phone Number: 09/23/2020, 3:16 PM  Clinical Narrative:     [facility] to [facility] non urgent Transport to [facility] room 25A, the patient stated that he will notify his family I notified the Nurse and the Doctor that EMS has been called, Covid results are back and are negative, the patient understands if insurance does not cover transport he will be responsible for the bill, he requested to go by EMS       Expected Discharge Plan and Services           Expected Discharge Date: 09/23/20                                     Social Determinants of Health (SDOH) Interventions    Readmission Risk Interventions No flowsheet data found.

## 2020-09-23 NOTE — Progress Notes (Signed)
Pt refused blood sugar finger stick

## 2020-09-23 NOTE — Progress Notes (Signed)
MD notified of IV loss prior to being able to complete Iron infusion.

## 2020-09-23 NOTE — Plan of Care (Signed)

## 2020-09-30 ENCOUNTER — Ambulatory Visit: Payer: Medicare Other | Admitting: Physician Assistant

## 2020-10-06 ENCOUNTER — Encounter: Payer: Medicare Other | Attending: Physician Assistant | Admitting: Physician Assistant

## 2020-10-06 ENCOUNTER — Other Ambulatory Visit: Payer: Self-pay

## 2020-10-06 DIAGNOSIS — I872 Venous insufficiency (chronic) (peripheral): Secondary | ICD-10-CM | POA: Diagnosis not present

## 2020-10-06 DIAGNOSIS — I890 Lymphedema, not elsewhere classified: Secondary | ICD-10-CM | POA: Diagnosis not present

## 2020-10-06 DIAGNOSIS — I739 Peripheral vascular disease, unspecified: Secondary | ICD-10-CM | POA: Diagnosis not present

## 2020-10-06 DIAGNOSIS — L97812 Non-pressure chronic ulcer of other part of right lower leg with fat layer exposed: Secondary | ICD-10-CM | POA: Insufficient documentation

## 2020-10-06 DIAGNOSIS — L89610 Pressure ulcer of right heel, unstageable: Secondary | ICD-10-CM | POA: Insufficient documentation

## 2020-10-06 DIAGNOSIS — L97512 Non-pressure chronic ulcer of other part of right foot with fat layer exposed: Secondary | ICD-10-CM | POA: Diagnosis not present

## 2020-10-06 DIAGNOSIS — I10 Essential (primary) hypertension: Secondary | ICD-10-CM | POA: Insufficient documentation

## 2020-10-06 DIAGNOSIS — Z7901 Long term (current) use of anticoagulants: Secondary | ICD-10-CM | POA: Insufficient documentation

## 2020-10-06 DIAGNOSIS — F17200 Nicotine dependence, unspecified, uncomplicated: Secondary | ICD-10-CM | POA: Insufficient documentation

## 2020-10-06 NOTE — Progress Notes (Signed)
SULTAN, PARGAS ([id number]) Visit Report for 10/06/2020 Abuse/Suicide Risk Screen Details Patient Name: Brad Frost, Brad Frost. Date of Service: 10/06/2020 2:30 PM Medical Record Number: [id number] Patient Account Number: [id number] Date of Birth/Sex: [birth year] (65 y.o. M) Treating RN: Rogers Blocker Primary Care Damarco Keysor: [name], [name] Other Clinician: Referring Sarahbeth Cashin: Eloisa Northern Treating Daniah Zaldivar/Extender: Rowan Blase in Treatment: 0 Abuse/Suicide Risk Screen Items Answer ABUSE RISK SCREEN: Has anyone close to you tried to hurt or harm you recentlyo No Do you feel uncomfortable with anyone in your familyo No Has anyone forced you do things that you didnot want to doo No Electronic Signature(s) Signed: 10/06/2020 4:30:24 PM By: Phillis Haggis, Dondra Prader RN Entered By: Phillis Haggis, Kenia on 10/06/2020 14:39:46 Zuba, Dollene Cleveland ([id number]) -------------------------------------------------------------------------------- Activities of Daily Living Details Patient Name: Brad Frost. Date of Service: 10/06/2020 2:30 PM Medical Record Number: [id number] Patient Account Number: [id number] Date of Birth/Sex: 1956/01/25 (65 y.o. M) Treating RN: Rogers Blocker Primary Care Oneda Duffett: [name], [name] Other Clinician: Referring Zarai Orsborn: Eloisa Northern Treating Avila Albritton/Extender: Rowan Blase in Treatment: 0 Activities of Daily Living Items Answer Activities of Daily Living (Please select one for each item) Drive Automobile Not Able Take Medications Need Assistance Use Telephone Need Assistance Care for Appearance Need Assistance Use Toilet Need Assistance Bath / Shower Need Assistance Dress Self Need Assistance Feed Self Need Assistance Walk Need Assistance Get In / Out Bed Need Assistance Housework Need Assistance Prepare Meals Need Assistance Handle Money Need Assistance Shop for Self Need Assistance Electronic Signature(s) Signed: 10/06/2020 4:30:24 PM By: Phillis Haggis, Dondra Prader RN Entered By: Phillis Haggis, Dondra Prader on 10/06/2020 14:40:15 Mccambridge, Dollene Cleveland ([id number]) -------------------------------------------------------------------------------- Education Screening Details Patient Name: Brad Frost. Date of Service: 10/06/2020 2:30 PM Medical Record Number: [id number] Patient Account Number: [id number] Date of Birth/Sex: May 22, 1955 (65 y.o. M) Treating RN: Rogers Blocker Primary Care Lynda Capistran: [name], [name] Other Clinician: Referring Jinger Middlesworth: Eloisa Northern Treating Ranika Mcniel/Extender: Rowan Blase in Treatment: 0 Primary Learner Assessed: Patient Learning Preferences/Education Level/Primary Language Learning Preference: Explanation, Demonstration Highest Education Level: College or Above Preferred Language: English Cognitive Barrier Language Barrier: No Translator Needed: No Memory Deficit: No Emotional Barrier: No Cultural/Religious Beliefs Affecting Medical Care: No Physical Barrier Impaired Vision: No Impaired Hearing: No Decreased Hand dexterity: No Knowledge/Comprehension Knowledge Level: Medium Comprehension Level: Medium Ability to understand written instructions: Medium Ability to understand verbal instructions: Medium Motivation Anxiety Level: Calm Cooperation: Cooperative Education Importance: Acknowledges Need Interest in Health Problems: Asks Questions Perception: Coherent Willingness to Engage in Self-Management Medium Activities: Readiness to Engage in Self-Management Medium Activities: Electronic Signature(s) Signed: 10/06/2020 4:30:24 PM By: Phillis Haggis, Dondra Prader RN Entered By: Phillis Haggis, Dondra Prader on 10/06/2020 14:40:43 Dasher, Dollene Cleveland ([id number]) -------------------------------------------------------------------------------- Fall Risk Assessment Details Patient Name: Brad Frost. Date of Service: 10/06/2020 2:30 PM Medical Record Number: [id number] Patient Account Number:  [id number] Date of Birth/Sex: 05-15-1955 (65 y.o. M) Treating RN: Rogers Blocker Primary Care Ramiah Helfrich: [name], [name] Other Clinician: Referring Kaydan Wong: Eloisa Northern Treating Asif Muchow/Extender: Rowan Blase in Treatment: 0 Fall Risk Assessment Items Have you had 2 or more falls in the last 12 monthso 0 No Have you had any fall that resulted in injury in the last 12 monthso 0 No FALLS RISK SCREEN History of falling - immediate or within 3 months 0 No Secondary diagnosis (Do you have 2 or more medical diagnoseso) 15 Yes Ambulatory aid None/bed rest/wheelchair/nurse 0 Yes Crutches/cane/walker 0 No Furniture 0 No Intravenous therapy Access/Saline/Heparin Lock 0 No Gait/Transferring Normal/ bed rest/  wheelchair 0 Yes Weak (short steps with or without shuffle, stooped but able to lift head while walking, may 0 No seek support from furniture) Impaired (short steps with shuffle, may have difficulty arising from chair, head down, impaired 0 No balance) Mental Status Oriented to own ability 0 Yes Electronic Signature(s) Signed: 10/06/2020 4:30:24 PM By: Phillis Haggis, Dondra Prader RN Entered By: Phillis Haggis, Kenia on 10/06/2020 14:41:11 Javier, Dollene Cleveland ([id number]) -------------------------------------------------------------------------------- Foot Assessment Details Patient Name: Brad Frost. Date of Service: 10/06/2020 2:30 PM Medical Record Number: [id number] Patient Account Number: [id number] Date of Birth/Sex: 1955-10-04 (65 y.o. M) Treating RN: Rogers Blocker Primary Care Jacque Garrels: [name], [name] Other Clinician: Referring Alayzha An: Eloisa Northern Treating Estephani Popper/Extender: Rowan Blase in Treatment: 0 Foot Assessment Items Site Locations + = Sensation present, - = Sensation absent, C = Callus, U = Ulcer R = Redness, W = Warmth, M = Maceration, PU = Pre-ulcerative lesion F = Fissure, S = Swelling, D = Dryness Assessment Right: Left: Other Deformity: No No Prior Foot  Ulcer: No No Prior Amputation: No No Charcot Joint: No No Ambulatory Status: Non-ambulatory Assistance Device: Wheelchair Gait: Surveyor, mining) Signed: 10/06/2020 4:30:24 PM By: Phillis Haggis, Dondra Prader RN Entered By: Phillis Haggis, Dondra Prader on 10/06/2020 14:47:16 Topper, Dollene Cleveland ([id number]) -------------------------------------------------------------------------------- Nutrition Risk Screening Details Patient Name: Brad Frost. Date of Service: 10/06/2020 2:30 PM Medical Record Number: [id number] Patient Account Number: [id number] Date of Birth/Sex: 1956/03/22 (65 y.o. M) Treating RN: Rogers Blocker Primary Care Tekisha Darcey: [name], [name] Other Clinician: Referring Lizmary Nader: Eloisa Northern Treating Shoaib Siefker/Extender: Rowan Blase in Treatment: 0 Height (in): 70 Weight (lbs): 172 Body Mass Index (BMI): 24.7 Nutrition Risk Screening Items Score Screening NUTRITION RISK SCREEN: I have an illness or condition that made me change the kind and/or amount of food I eat 0 No I eat fewer than two meals per day 0 No I eat few fruits and vegetables, or milk products 0 No I have three or more drinks of beer, liquor or wine almost every day 0 No I have tooth or mouth problems that make it hard for me to eat 0 No I don't always have enough money to buy the food I need 0 No I eat alone most of the time 0 No I take three or more different prescribed or over-the-counter drugs a day 1 Yes Without wanting to, I have lost or gained 10 pounds in the last six months 0 No I am not always physically able to shop, cook and/or feed myself 0 No Nutrition Protocols Good Risk Protocol 0 No interventions needed Moderate Risk Protocol High Risk Proctocol Risk Level: Good Risk Score: 1 Electronic Signature(s) Signed: 10/06/2020 4:30:24 PM By: Phillis Haggis, Dondra Prader RN Entered By: Phillis Haggis, Dondra Prader on 10/06/2020 14:41:27

## 2020-10-06 NOTE — Progress Notes (Signed)
Brad, Frost ([id number]) Visit Report for 10/06/2020 Chief Complaint Document Details Patient Name: Brad Frost, Brad Frost. Date of Service: 10/06/2020 2:30 PM Medical Record Number: [id number] Patient Account Number: [id number] Date of Birth/Sex: [birth year] (65 y.o. M) Treating RN: Primary Care Provider: [name], [name] Other Clinician: Referring Provider: Eloisa Northern Treating Provider/Extender: Rowan Blase in Treatment: 0 Information Obtained from: Patient Chief Complaint Right LE Ulcers with Arterial Insufficiency Electronic Signature(s) Signed: 10/06/2020 3:53:00 PM By: Lenda Kelp PA-C Entered By: Lenda Kelp on 10/06/2020 15:52:59 Kleve, Dollene Cleveland ([id number]) -------------------------------------------------------------------------------- HPI Details Patient Name: Brad Frost. Date of Service: 10/06/2020 2:30 PM Medical Record Number: [id number] Patient Account Number: [id number] Date of Birth/Sex: 03-Sep-1955 (65 y.o. M) Treating RN: Primary Care Provider: [name], [name] Other Clinician: Referring Provider: Eloisa Northern Treating Provider/Extender: Rowan Blase in Treatment: 0 History of Present Illness HPI Description: 10/06/2020 upon evaluation today patient appears to be doing somewhat poorly in regard to his foot ulcer unfortunately. He has been tolerating the dressing changes without complication. Fortunately there does not appear to be any evidence of active infection which is great news. No fevers, chills, nausea, vomiting, or diarrhea. With that being said he does unfortunately have significant peripheral vascular disease. He does have a vascular surgeon, Dr. Gilda Crease who has been taking care of him. I did review his note from [facility] from 09/19/2020 and this was an abdominal aortogram. Subsequently the patient did have some vascular work to try to improve blood flow. There was comment on whether or not he could undergo a repeat bypass  surgery as he previously had one of the side. Subsequently it was suggested that this would not be something the patient would actually tolerate and therefore it is not advised that is the right way to go at this point. With that being said the patient does seem to be doing okay from the standpoint of infection currently but nonetheless I am very concerned about the fact that if things do not continue to improve he may actually be looking at above-knee amputation which is what Dr. Gilda Crease has alluded to as well to be honest. With that being said I did review the patient's wounds with him today I explained what the critical nature of the peripheral vascular disease diagnosis was in this case and the fact that if we cannot get the wounds to heal he would be looking at an above-knee amputation he voiced understanding. The patient is on Eliquis which appears to be as a result of a stent being placed try to keep this patent and open. With that being said even so I am not certain whether or not he is going to be able to actually heal this wound. I think that there is a chance for healing but also a high likelihood that this may be a very difficult prospect. Unfortunately it was noted as well that the patient did have maggots in around the wounds on his toes upon evaluation today. Obviously this is something that we did attempt to addressed as much as possible today although again there is no guarantee that everything was completely removed and these were very tiny so it also may be that others hatch in the interim between now and when we were to see him back. The patient was somewhat of a poor historian and did not come with any paperwork from the facility so I did not have any further information on him to be perfectly honest. I do know  that however he is supposed to be on the Eliquis at this point. He does have chronic venous insufficiency noted as well as lymphedema. Electronic Signature(s) Signed:  10/06/2020 5:01:09 PM By: Lenda Kelp PA-C Entered By: Lenda Kelp on 10/06/2020 17:01:09 Fronek, Dollene Cleveland ([id number]) -------------------------------------------------------------------------------- Physical Exam Details Patient Name: Brad Frost Date of Service: 10/06/2020 2:30 PM Medical Record Number: [id number] Patient Account Number: [id number] Date of Birth/Sex: 1955/08/17 (65 y.o. M) Treating RN: Primary Care Provider: [name], [name] Other Clinician: Referring Provider: Eloisa Northern Treating Provider/Extender: Rowan Blase in Treatment: 0 Constitutional sitting or standing blood pressure is within target range for patient.. pulse regular and within target range for patient.Marland Kitchen respirations regular, non- labored and within target range for patient.Marland Kitchen temperature within target range for patient.. Well-nourished and well-hydrated in no acute distress. Eyes conjunctiva clear no eyelid edema noted. pupils equal round and reactive to light and accommodation. Ears, Nose, Mouth, and Throat no gross abnormality of ear auricles or external auditory canals. normal hearing noted during conversation. mucus membranes moist. Respiratory normal breathing without difficulty. Cardiovascular Absent posterior tibial and dorsalis pedis pulses bilateral lower extremities. Patient has bilateral stage III lymphedema.. Musculoskeletal normal gait and posture. no significant deformity or arthritic changes, no loss or range of motion, no clubbing. Psychiatric this patient is able to make decisions and demonstrates good insight into disease process. Alert and Oriented x 3. pleasant and cooperative. Notes Upon inspection patient's wounds actually showed signs of fairly good granulation epithelization at this point. There does not appear to be any evidence of infection which is great and overall I am extremely pleased with where things stand at this time. He did have maggots noted in the wound  bed which though unfortunate does not appear to be causing him any discomfort or problems. He was extremely surprised however to note that this was noted today. Obviously that was not expected whatsoever. We did clear away as many of these as possible in order to aid in overall improving the wounds as much as possible. The patient did not have any discomfort with any of the removal which was great news as well. Otherwise no sharp debridement was performed obviously due to the fact that he has significant peripheral vascular disease I really not interested in performing a lot of sharp debridement I think that could be more detrimental to him than good as far as that is concerned. I think the biggest thing is good to be trying to keep the area clean and dry is much as possible I think Betadine and dry gauze is probably our best bet in this regard. I do think that he is can need to have this changed daily in order to keep things moving in the appropriate direction. Electronic Signature(s) Signed: 10/06/2020 5:05:36 PM By: Lenda Kelp PA-C Entered By: Lenda Kelp on 10/06/2020 17:05:36 Kellen, Dollene Cleveland ([id number]) -------------------------------------------------------------------------------- Physician Orders Details Patient Name: Brad Frost Date of Service: 10/06/2020 2:30 PM Medical Record Number: [id number] Patient Account Number: [id number] Date of Birth/Sex: Mar 17, 1956 (65 y.o. M) Treating RN: Hansel Feinstein Primary Care Provider: [name], [name] Other Clinician: Referring Provider: Eloisa Northern Treating Provider/Extender: Rowan Blase in Treatment: 0 Verbal / Phone Orders: No Diagnosis Coding ICD-10 Coding Code Description I73.89 Other specified peripheral vascular diseases I87.2 Venous insufficiency (chronic) (peripheral) L97.512 Non-pressure chronic ulcer of other part of right foot with fat layer exposed L97.812 Non-pressure chronic ulcer of other part of right lower leg  with fat  layer exposed L89.610 Pressure ulcer of right heel, unstageable Z79.01 Long term (current) use of anticoagulants Follow-up Appointments o Return Appointment in 2 weeks. Bathing/ Shower/ Hygiene o Wash wounds with antibacterial soap and water. o May shower; gently cleanse wound with antibacterial soap, rinse and pat dry prior to dressing wounds - always change dressings immediately after shower o No tub bath. Edema Control - Lymphedema / Segmental Compressive Device / Other o Elevate legs to the level of the heart and pump ankles as often as possible o DO YOUR BEST to sleep in the bed at night. DO NOT sleep in your recliner. Long hours of sitting in a recliner leads to swelling of the legs and/or potential wounds on your backside. Additional Orders / Instructions o Follow Nutritious Diet and Increase Protein Intake o Other: - Send current med list to wound care appt at [facility]; FOLLOW DAILY DRESSING CHANGES-SEE ORDERS Wound Treatment Wound #1 - Lower Leg Wound Laterality: Right, Posterior Cleanser: Normal Saline 1 x Per Day/30 Days Discharge Instructions: Wash your hands with soap and water. Remove old dressing, discard into plastic bag and place into trash. Cleanse the wound with Normal Saline prior to applying a clean dressing using gauze sponges, not tissues or cotton balls. Do not scrub or use excessive force. Pat dry using gauze sponges, not tissue or cotton balls. Primary Dressing: Silvercel 4 1/4x 4 1/4 (in/in) 1 x Per Day/30 Days Discharge Instructions: Apply Silvercel 4 1/4x 4 1/4 (in/in) as instructed Secondary Dressing: ABD Pad 5x9 (in/in) 1 x Per Day/30 Days Discharge Instructions: Cover with ABD pad Secondary Dressing: Kerlix 4.5 x 4.1 (in/yd) 1 x Per Day/30 Days Discharge Instructions: LIGHTLY COVER WITH Kerlix TO SECURE OVER SILVER ALGINATE 4.5 x 4.1 (in/yd) as instructed Secured With: 45M Medipore H Soft Cloth Surgical Tape, 2x2 (in/yd) 1 x Per  Day/30 Days Discharge Instructions: TO SECURE KERLIX Wound #10 - Toe Fourth Wound Laterality: Right, Lateral Cleanser: Betadine-swabs 1 x Per Day/30 Days Discharge Instructions: Apply Betadine as directed Cleanser: Normal Saline 1 x Per Day/30 Days ENOCHMahamed, Zalewski ([id number]) Discharge Instructions: Wash your hands with soap and water. Remove old dressing, discard into plastic bag and place into trash. Cleanse the wound with Normal Saline prior to applying a clean dressing using gauze sponges, not tissues or cotton balls. Do not scrub or use excessive force. Pat dry using gauze sponges, not tissue or cotton balls. Primary Dressing: Gauze 1 x Per Day/30 Days Discharge Instructions: TO COVER WOUNDS BETEEN TOES Secondary Dressing: ABD Pad 5x9 (in/in) 1 x Per Day/30 Days Discharge Instructions: Cover with ABD pad FOR PADDING/COVERAGE Secondary Dressing: Kerlix 4.5 x 4.1 (in/yd) 1 x Per Day/30 Days Discharge Instructions: LIGHTLY Apply Kerlix 4.5 x 4.1 (in/yd) as instructed Secured With: 45M Medipore H Soft Cloth Surgical Tape, 2x2 (in/yd) 1 x Per Day/30 Days Discharge Instructions: TO HOLD KERLIX Wound #11 - Toe Fifth Wound Laterality: Right, Medial Cleanser: Betadine-swabs 1 x Per Day/30 Days Discharge Instructions: Apply Betadine as directed Cleanser: Normal Saline 1 x Per Day/30 Days Discharge Instructions: Wash your hands with soap and water. Remove old dressing, discard into plastic bag and place into trash. Cleanse the wound with Normal Saline prior to applying a clean dressing using gauze sponges, not tissues or cotton balls. Do not scrub or use excessive force. Pat dry using gauze sponges, not tissue or cotton balls. Primary Dressing: Gauze 1 x Per Day/30 Days Discharge Instructions: TO COVER WOUNDS BETEEN TOES Secondary Dressing: ABD Pad 5x9 (  in/in) 1 x Per Day/30 Days Discharge Instructions: Cover with ABD pad FOR PADDING/COVERAGE Secondary Dressing: Kerlix 4.5 x 4.1 (in/yd) 1 x  Per Day/30 Days Discharge Instructions: LIGHTLY Apply Kerlix 4.5 x 4.1 (in/yd) as instructed Secured With: 44M Medipore H Soft Cloth Surgical Tape, 2x2 (in/yd) 1 x Per Day/30 Days Discharge Instructions: TO HOLD KERLIX Wound #12 - Toe Fifth Wound Laterality: Right, Lateral Cleanser: Betadine-swabs 1 x Per Day/30 Days Discharge Instructions: Apply Betadine as directed Cleanser: Normal Saline 1 x Per Day/30 Days Discharge Instructions: Wash your hands with soap and water. Remove old dressing, discard into plastic bag and place into trash. Cleanse the wound with Normal Saline prior to applying a clean dressing using gauze sponges, not tissues or cotton balls. Do not scrub or use excessive force. Pat dry using gauze sponges, not tissue or cotton balls. Primary Dressing: Gauze 1 x Per Day/30 Days Discharge Instructions: TO COVER WOUNDS BETEEN TOES Secondary Dressing: ABD Pad 5x9 (in/in) 1 x Per Day/30 Days Discharge Instructions: Cover with ABD pad FOR PADDING/COVERAGE Secondary Dressing: Kerlix 4.5 x 4.1 (in/yd) 1 x Per Day/30 Days Discharge Instructions: LIGHTLY Apply Kerlix 4.5 x 4.1 (in/yd) as instructed Secured With: 44M Medipore H Soft Cloth Surgical Tape, 2x2 (in/yd) 1 x Per Day/30 Days Discharge Instructions: TO HOLD KERLIX Wound #2 - Foot Wound Laterality: Dorsal, Right Cleanser: Betadine-swabs 1 x Per Day/30 Days Discharge Instructions: Apply Betadine as directed Cleanser: Normal Saline 1 x Per Day/30 Days Discharge Instructions: Wash your hands with soap and water. Remove old dressing, discard into plastic bag and place into trash. Cleanse the wound with Normal Saline prior to applying a clean dressing using gauze sponges, not tissues or cotton balls. Do not scrub or use excessive force. Pat dry using gauze sponges, not tissue or cotton balls. Sisley, KARTEL WOLBERT ([id number]) Primary Dressing: Gauze 1 x Per Day/30 Days Discharge Instructions: TO COVER WOUNDS BETEEN TOES Secondary  Dressing: ABD Pad 5x9 (in/in) 1 x Per Day/30 Days Discharge Instructions: Cover with ABD pad FOR PADDING/COVERAGE Secondary Dressing: Kerlix 4.5 x 4.1 (in/yd) 1 x Per Day/30 Days Discharge Instructions: LIGHTLY Apply Kerlix 4.5 x 4.1 (in/yd) as instructed Secured With: 44M Medipore H Soft Cloth Surgical Tape, 2x2 (in/yd) 1 x Per Day/30 Days Discharge Instructions: TO HOLD KERLIX Wound #3 - Calcaneus Wound Laterality: Right Cleanser: Betadine-swabs 1 x Per Day/30 Days Discharge Instructions: Apply Betadine as directed Cleanser: Normal Saline 1 x Per Day/30 Days Discharge Instructions: Wash your hands with soap and water. Remove old dressing, discard into plastic bag and place into trash. Cleanse the wound with Normal Saline prior to applying a clean dressing using gauze sponges, not tissues or cotton balls. Do not scrub or use excessive force. Pat dry using gauze sponges, not tissue or cotton balls. Primary Dressing: Gauze 1 x Per Day/30 Days Discharge Instructions: TO COVER WOUNDS BETEEN TOES Secondary Dressing: ABD Pad 5x9 (in/in) 1 x Per Day/30 Days Discharge Instructions: Cover with ABD pad FOR PADDING/COVERAGE Secondary Dressing: Kerlix 4.5 x 4.1 (in/yd) 1 x Per Day/30 Days Discharge Instructions: LIGHTLY Apply Kerlix 4.5 x 4.1 (in/yd) as instructed Secured With: 44M Medipore H Soft Cloth Surgical Tape, 2x2 (in/yd) 1 x Per Day/30 Days Discharge Instructions: TO HOLD KERLIX Wound #4 - Toe Great Wound Laterality: Right Cleanser: Betadine-swabs 1 x Per Day/30 Days Discharge Instructions: Apply Betadine as directed Cleanser: Normal Saline 1 x Per Day/30 Days Discharge Instructions: Wash your hands with soap and water. Remove old dressing, discard into plastic  bag and place into trash. Cleanse the wound with Normal Saline prior to applying a clean dressing using gauze sponges, not tissues or cotton balls. Do not scrub or use excessive force. Pat dry using gauze sponges, not tissue or cotton  balls. Primary Dressing: Gauze 1 x Per Day/30 Days Discharge Instructions: TO COVER WOUNDS BETEEN TOES Secondary Dressing: ABD Pad 5x9 (in/in) 1 x Per Day/30 Days Discharge Instructions: Cover with ABD pad FOR PADDING/COVERAGE Secondary Dressing: Kerlix 4.5 x 4.1 (in/yd) 1 x Per Day/30 Days Discharge Instructions: LIGHTLY Apply Kerlix 4.5 x 4.1 (in/yd) as instructed Secured With: 61M Medipore H Soft Cloth Surgical Tape, 2x2 (in/yd) 1 x Per Day/30 Days Discharge Instructions: TO HOLD KERLIX Wound #5 - Toe Second Wound Laterality: Right, Medial Cleanser: Betadine-swabs 1 x Per Day/30 Days Discharge Instructions: Apply Betadine as directed Cleanser: Normal Saline 1 x Per Day/30 Days Discharge Instructions: Wash your hands with soap and water. Remove old dressing, discard into plastic bag and place into trash. Cleanse the wound with Normal Saline prior to applying a clean dressing using gauze sponges, not tissues or cotton balls. Do not scrub or use excessive force. Pat dry using gauze sponges, not tissue or cotton balls. Primary Dressing: Gauze 1 x Per Day/30 Days Discharge Instructions: TO COVER WOUNDS BETEEN TOES Secondary Dressing: ABD Pad 5x9 (in/in) 1 x Per Day/30 Days Drew, SEITH AIKEY ([id number]) Discharge Instructions: Cover with ABD pad FOR PADDING/COVERAGE Secondary Dressing: Kerlix 4.5 x 4.1 (in/yd) 1 x Per Day/30 Days Discharge Instructions: LIGHTLY Apply Kerlix 4.5 x 4.1 (in/yd) as instructed Secured With: 61M Medipore H Soft Cloth Surgical Tape, 2x2 (in/yd) 1 x Per Day/30 Days Discharge Instructions: TO HOLD KERLIX Wound #6 - Toe Second Wound Laterality: Right, Lateral Cleanser: Betadine-swabs 1 x Per Day/30 Days Discharge Instructions: Apply Betadine as directed Cleanser: Normal Saline 1 x Per Day/30 Days Discharge Instructions: Wash your hands with soap and water. Remove old dressing, discard into plastic bag and place into trash. Cleanse the wound with Normal Saline prior  to applying a clean dressing using gauze sponges, not tissues or cotton balls. Do not scrub or use excessive force. Pat dry using gauze sponges, not tissue or cotton balls. Primary Dressing: Gauze 1 x Per Day/30 Days Discharge Instructions: TO COVER WOUNDS BETEEN TOES Secondary Dressing: ABD Pad 5x9 (in/in) 1 x Per Day/30 Days Discharge Instructions: Cover with ABD pad FOR PADDING/COVERAGE Secondary Dressing: Kerlix 4.5 x 4.1 (in/yd) 1 x Per Day/30 Days Discharge Instructions: LIGHTLY Apply Kerlix 4.5 x 4.1 (in/yd) as instructed Secured With: 61M Medipore H Soft Cloth Surgical Tape, 2x2 (in/yd) 1 x Per Day/30 Days Discharge Instructions: TO HOLD KERLIX Wound #7 - Toe Third Wound Laterality: Right, Medial Cleanser: Betadine-swabs 1 x Per Day/30 Days Discharge Instructions: Apply Betadine as directed Cleanser: Normal Saline 1 x Per Day/30 Days Discharge Instructions: Wash your hands with soap and water. Remove old dressing, discard into plastic bag and place into trash. Cleanse the wound with Normal Saline prior to applying a clean dressing using gauze sponges, not tissues or cotton balls. Do not scrub or use excessive force. Pat dry using gauze sponges, not tissue or cotton balls. Primary Dressing: Gauze 1 x Per Day/30 Days Discharge Instructions: TO COVER WOUNDS BETEEN TOES Secondary Dressing: ABD Pad 5x9 (in/in) 1 x Per Day/30 Days Discharge Instructions: Cover with ABD pad FOR PADDING/COVERAGE Secondary Dressing: Kerlix 4.5 x 4.1 (in/yd) 1 x Per Day/30 Days Discharge Instructions: LIGHTLY Apply Kerlix 4.5 x 4.1 (in/yd) as instructed  Secured With: 67M Medipore H Soft Cloth Surgical Tape, 2x2 (in/yd) 1 x Per Day/30 Days Discharge Instructions: TO HOLD KERLIX Wound #8 - Toe Third Wound Laterality: Right Cleanser: Betadine-swabs 1 x Per Day/30 Days Discharge Instructions: Apply Betadine as directed Cleanser: Normal Saline 1 x Per Day/30 Days Discharge Instructions: Wash your hands with soap  and water. Remove old dressing, discard into plastic bag and place into trash. Cleanse the wound with Normal Saline prior to applying a clean dressing using gauze sponges, not tissues or cotton balls. Do not scrub or use excessive force. Pat dry using gauze sponges, not tissue or cotton balls. Primary Dressing: Gauze 1 x Per Day/30 Days Discharge Instructions: TO COVER WOUNDS BETEEN TOES Secondary Dressing: ABD Pad 5x9 (in/in) 1 x Per Day/30 Days Discharge Instructions: Cover with ABD pad FOR PADDING/COVERAGE Secondary Dressing: Kerlix 4.5 x 4.1 (in/yd) 1 x Per Day/30 Days Discharge Instructions: LIGHTLY Apply Kerlix 4.5 x 4.1 (in/yd) as instructed Bastarache, Tong Elbert Ewings ([id number]) Secured With: 67M Medipore H Soft Cloth Surgical Tape, 2x2 (in/yd) 1 x Per Day/30 Days Discharge Instructions: TO HOLD KERLIX Wound #9 - Toe Fourth Wound Laterality: Right, Medial Cleanser: Betadine-swabs 1 x Per Day/30 Days Discharge Instructions: Apply Betadine as directed Cleanser: Normal Saline 1 x Per Day/30 Days Discharge Instructions: Wash your hands with soap and water. Remove old dressing, discard into plastic bag and place into trash. Cleanse the wound with Normal Saline prior to applying a clean dressing using gauze sponges, not tissues or cotton balls. Do not scrub or use excessive force. Pat dry using gauze sponges, not tissue or cotton balls. Primary Dressing: Gauze 1 x Per Day/30 Days Discharge Instructions: TO COVER WOUNDS BETEEN TOES Secondary Dressing: ABD Pad 5x9 (in/in) 1 x Per Day/30 Days Discharge Instructions: Cover with ABD pad FOR PADDING/COVERAGE Secondary Dressing: Kerlix 4.5 x 4.1 (in/yd) 1 x Per Day/30 Days Discharge Instructions: LIGHTLY Apply Kerlix 4.5 x 4.1 (in/yd) as instructed Secured With: 67M Medipore H Soft Cloth Surgical Tape, 2x2 (in/yd) 1 x Per Day/30 Days Discharge Instructions: TO Posey Rea Electronic Signature(s) Signed: 10/06/2020 4:11:38 PM By: Hansel Feinstein Signed:  10/06/2020 5:46:17 PM By: Lenda Kelp PA-C Entered By: Hansel Feinstein on 10/06/2020 16:09:54 Friebel, Dollene Cleveland ([id number]) -------------------------------------------------------------------------------- Problem List Details Patient Name: Brad Frost. Date of Service: 10/06/2020 2:30 PM Medical Record Number: [id number] Patient Account Number: [id number] Date of Birth/Sex: [birth year] (65 y.o. M) Treating RN: Primary Care Provider: [name], [name] Other Clinician: Referring Provider: Eloisa Northern Treating Provider/Extender: Rowan Blase in Treatment: 0 Active Problems ICD-10 Encounter Code Description Active Date MDM Diagnosis I73.89 Other specified peripheral vascular diseases 10/06/2020 No Yes I87.2 Venous insufficiency (chronic) (peripheral) 10/06/2020 No Yes L97.512 Non-pressure chronic ulcer of other part of right foot with fat layer 10/06/2020 No Yes exposed L97.812 Non-pressure chronic ulcer of other part of right lower leg with fat layer 10/06/2020 No Yes exposed L89.610 Pressure ulcer of right heel, unstageable 10/06/2020 No Yes Z79.01 Long term (current) use of anticoagulants 10/06/2020 No Yes Inactive Problems Resolved Problems Electronic Signature(s) Signed: 10/06/2020 3:52:20 PM By: Lenda Kelp PA-C Entered By: Lenda Kelp on 10/06/2020 15:52:20 Scheurich, Dollene Cleveland ([id number]) -------------------------------------------------------------------------------- Progress Note Details Patient Name: Brad Frost. Date of Service: 10/06/2020 2:30 PM Medical Record Number: [id number] Patient Account Number: [id number] Date of Birth/Sex: 05/01/1955 (65 y.o. M) Treating RN: Primary Care Provider: [name], [name] Other Clinician: Referring Provider: Eloisa Northern Treating Provider/Extender: Rowan Blase in Treatment: 0 Subjective Chief Complaint Information  obtained from Patient Right LE Ulcers with Arterial Insufficiency History of Present Illness (HPI) 10/06/2020  upon evaluation today patient appears to be doing somewhat poorly in regard to his foot ulcer unfortunately. He has been tolerating the dressing changes without complication. Fortunately there does not appear to be any evidence of active infection which is great news. No fevers, chills, nausea, vomiting, or diarrhea. With that being said he does unfortunately have significant peripheral vascular disease. He does have a vascular surgeon, Dr. Gilda Crease who has been taking care of him. I did review his note from [facility] from 09/19/2020 and this was an abdominal aortogram. Subsequently the patient did have some vascular work to try to improve blood flow. There was comment on whether or not he could undergo a repeat bypass surgery as he previously had one of the side. Subsequently it was suggested that this would not be something the patient would actually tolerate and therefore it is not advised that is the right way to go at this point. With that being said the patient does seem to be doing okay from the standpoint of infection currently but nonetheless I am very concerned about the fact that if things do not continue to improve he may actually be looking at above-knee amputation which is what Dr. Gilda Crease has alluded to as well to be honest. With that being said I did review the patient's wounds with him today I explained what the critical nature of the peripheral vascular disease diagnosis was in this case and the fact that if we cannot get the wounds to heal he would be looking at an above-knee amputation he voiced understanding. The patient is on Eliquis which appears to be as a result of a stent being placed try to keep this patent and open. With that being said even so I am not certain whether or not he is going to be able to actually heal this wound. I think that there is a chance for healing but also a high likelihood that this may be a very difficult prospect. Unfortunately it was  noted as well that the patient did have maggots in around the wounds on his toes upon evaluation today. Obviously this is something that we did attempt to addressed as much as possible today although again there is no guarantee that everything was completely removed and these were very tiny so it also may be that others hatch in the interim between now and when we were to see him back. The patient was somewhat of a poor historian and did not come with any paperwork from the facility so I did not have any further information on him to be perfectly honest. I do know that however he is supposed to be on the Eliquis at this point. He does have chronic venous insufficiency noted as well as lymphedema. Patient History Information obtained from Patient. Allergies No Known Drug Allergies Social History Current some day smoker, Alcohol Use - Never, Drug Use - No History, Caffeine Use - Moderate. Medical History Cardiovascular Patient has history of Hypertension Review of Systems (ROS) Constitutional Symptoms (General Health) Denies complaints or symptoms of Fatigue, Fever, Chills, Marked Weight Change. Eyes Denies complaints or symptoms of Dry Eyes, Vision Changes, Glasses / Contacts. Ear/Nose/Mouth/Throat Denies complaints or symptoms of Difficult clearing ears, Sinusitis. Hematologic/Lymphatic Denies complaints or symptoms of Bleeding / Clotting Disorders, Human Immunodeficiency Virus. Respiratory Denies complaints or symptoms of Chronic or frequent coughs, Shortness of Breath. Cardiovascular Denies complaints or symptoms  of Chest pain, LE edema. Gastrointestinal Denies complaints or symptoms of Frequent diarrhea, Nausea, Vomiting. Endocrine Denies complaints or symptoms of Hepatitis, Thyroid disease, Polydypsia (Excessive Thirst). Genitourinary Denies complaints or symptoms of Kidney failure/ Dialysis, Incontinence/dribbling. Immunological Denies complaints or symptoms of Hives,  Itching. Integumentary (Skin) Kranz, Ziere L. ([id number]) Complains or has symptoms of Wounds. Denies complaints or symptoms of Bleeding or bruising tendency, Breakdown, Swelling. Musculoskeletal Denies complaints or symptoms of Muscle Pain, Muscle Weakness. Neurologic Denies complaints or symptoms of Numbness/parasthesias, Focal/Weakness. Psychiatric Denies complaints or symptoms of Anxiety, Claustrophobia. Objective Constitutional sitting or standing blood pressure is within target range for patient.. pulse regular and within target range for patient.Marland Kitchen respirations regular, non- labored and within target range for patient.Marland Kitchen temperature within target range for patient.. Well-nourished and well-hydrated in no acute distress. Vitals Time Taken: 2:34 PM, Height: 70 in, Source: Stated, Weight: 172 lbs, Source: Stated, BMI: 24.7, Temperature: 98.4 F, Pulse: 83 bpm, Respiratory Rate: 18 breaths/min, Blood Pressure: 103/58 mmHg. Eyes conjunctiva clear no eyelid edema noted. pupils equal round and reactive to light and accommodation. Ears, Nose, Mouth, and Throat no gross abnormality of ear auricles or external auditory canals. normal hearing noted during conversation. mucus membranes moist. Respiratory normal breathing without difficulty. Cardiovascular Absent posterior tibial and dorsalis pedis pulses bilateral lower extremities. Patient has bilateral stage III lymphedema.. Musculoskeletal normal gait and posture. no significant deformity or arthritic changes, no loss or range of motion, no clubbing. Psychiatric this patient is able to make decisions and demonstrates good insight into disease process. Alert and Oriented x 3. pleasant and cooperative. General Notes: Upon inspection patient's wounds actually showed signs of fairly good granulation epithelization at this point. There does not appear to be any evidence of infection which is great and overall I am extremely pleased with  where things stand at this time. He did have maggots noted in the wound bed which though unfortunate does not appear to be causing him any discomfort or problems. He was extremely surprised however to note that this was noted today. Obviously that was not expected whatsoever. We did clear away as many of these as possible in order to aid in overall improving the wounds as much as possible. The patient did not have any discomfort with any of the removal which was great news as well. Otherwise no sharp debridement was performed obviously due to the fact that he has significant peripheral vascular disease I really not interested in performing a lot of sharp debridement I think that could be more detrimental to him than good as far as that is concerned. I think the biggest thing is good to be trying to keep the area clean and dry is much as possible I think Betadine and dry gauze is probably our best bet in this regard. I do think that he is can need to have this changed daily in order to keep things moving in the appropriate direction. Integumentary (Hair, Skin) Wound #1 status is Open. Original cause of wound was Gradually Appeared. The date acquired was: 10/07/2019. The wound is located on the Right,Posterior Lower Leg. The wound measures 10cm length x 11cm width x 0.1cm depth; 86.394cm^2 area and 8.639cm^3 volume. There is Fat Layer (Subcutaneous Tissue) exposed. There is no tunneling or undermining noted. There is a medium amount of serosanguineous drainage noted. There is large (67-100%) red, pink granulation within the wound bed. There is a small (1-33%) amount of necrotic tissue within the wound bed including Eschar and Adherent Slough.  Wound #10 status is Open. Original cause of wound was Gradually Appeared. The date acquired was: 10/06/2020. The wound is located on the Right,Lateral Toe Fourth. The wound measures 1cm length x 0.8cm width x 0.1cm depth; 0.628cm^2 area and 0.063cm^3 volume. There  is Fat Layer (Subcutaneous Tissue) exposed. There is no tunneling or undermining noted. There is a medium amount of serosanguineous drainage noted. There is small (1-33%) pink granulation within the wound bed. There is a large (67-100%) amount of necrotic tissue within the wound bed including Adherent Slough. Wound #11 status is Open. Original cause of wound was Gradually Appeared. The date acquired was: 10/06/2020. The wound is located on the Right,Medial Toe Fifth. The wound measures 1.2cm length x 1.3cm width x 0.1cm depth; 1.225cm^2 area and 0.123cm^3 volume. There is Fat Layer (Subcutaneous Tissue) exposed. There is no tunneling or undermining noted. There is a medium amount of serosanguineous drainage noted. There is small (1-33%) pink granulation within the wound bed. There is a large (67-100%) amount of necrotic tissue within the wound bed including Adherent Slough. Wound #12 status is Open. Original cause of wound was Gradually Appeared. The date acquired was: 10/06/2020. The wound is located on the Right,Lateral Toe Fifth. The wound measures 0.8cm length x 1cm width x 0.1cm depth; 0.628cm^2 area and 0.063cm^3 volume. There is Fat Lupo, Bayler L. ([id number]) Layer (Subcutaneous Tissue) exposed. There is no tunneling or undermining noted. There is a medium amount of serosanguineous drainage noted. There is large (67-100%) red granulation within the wound bed. There is a small (1-33%) amount of necrotic tissue within the wound bed including Adherent Slough. Wound #2 status is Open. Original cause of wound was Gradually Appeared. The date acquired was: 10/07/2019. The wound is located on the Right,Dorsal Foot. The wound measures 6.5cm length x 6cm width x 0.2cm depth; 30.631cm^2 area and 6.126cm^3 volume. There is Fat Layer (Subcutaneous Tissue) exposed. There is no tunneling or undermining noted. There is a medium amount of serosanguineous drainage noted. There is small (1-33%) pink  granulation within the wound bed. There is a large (67-100%) amount of necrotic tissue within the wound bed including Eschar and Adherent Slough. Wound #3 status is Open. Original cause of wound was Pressure Injury. The date acquired was: 10/06/2020. The wound is located on the Right Calcaneus. The wound measures 5.3cm length x 7.3cm width x 0.1cm depth; 30.387cm^2 area and 3.039cm^3 volume. There is no tunneling or undermining noted. There is a none present amount of drainage noted. There is no granulation within the wound bed. There is a large (67-100%) amount of necrotic tissue within the wound bed including Adherent Slough. Wound #4 status is Open. Original cause of wound was Gradually Appeared. The date acquired was: 10/06/2020. The wound is located on the Right Toe Great. The wound measures 1.5cm length x 2.7cm width x 0.2cm depth; 3.181cm^2 area and 0.636cm^3 volume. There is Fat Layer (Subcutaneous Tissue) exposed. There is no tunneling or undermining noted. There is a medium amount of serosanguineous drainage noted. There is small (1-33%) pink granulation within the wound bed. There is a large (67-100%) amount of necrotic tissue within the wound bed including Eschar and Adherent Slough. Wound #5 status is Open. Original cause of wound was Gradually Appeared. The date acquired was: 10/06/2020. The wound is located on the Right,Medial Toe Second. The wound measures 1.9cm length x 2cm width x 0.2cm depth; 2.985cm^2 area and 0.597cm^3 volume. There is Fat Layer (Subcutaneous Tissue) exposed. There is no tunneling or undermining  noted. There is a medium amount of serosanguineous drainage noted. There is no granulation within the wound bed. There is a large (67-100%) amount of necrotic tissue within the wound bed including Eschar and Adherent Slough. Wound #6 status is Open. Original cause of wound was Gradually Appeared. The date acquired was: 10/06/2020. The wound is located on the Right,Lateral  Toe Second. The wound measures 2.5cm length x 1cm width x 0.2cm depth; 1.963cm^2 area and 0.393cm^3 volume. There is Fat Layer (Subcutaneous Tissue) exposed. There is no tunneling or undermining noted. There is a medium amount of serosanguineous drainage noted. There is no granulation within the wound bed. There is a large (67-100%) amount of necrotic tissue within the wound bed including Eschar and Adherent Slough. Wound #7 status is Open. Original cause of wound was Gradually Appeared. The date acquired was: 10/06/2020. The wound is located on the Right,Medial Toe Third. The wound measures 2.3cm length x 0.8cm width x 0.2cm depth; 1.445cm^2 area and 0.289cm^3 volume. There is Fat Layer (Subcutaneous Tissue) exposed. There is no tunneling or undermining noted. There is a medium amount of serosanguineous drainage noted. There is no granulation within the wound bed. There is a large (67-100%) amount of necrotic tissue within the wound bed including Adherent Slough. Wound #8 status is Open. Original cause of wound was Gradually Appeared. The date acquired was: 10/06/2020. The wound is located on the Right Toe Third. The wound measures 1.2cm length x 1.2cm width x 0.2cm depth; 1.131cm^2 area and 0.226cm^3 volume. There is Fat Layer (Subcutaneous Tissue) exposed. There is no tunneling or undermining noted. There is a medium amount of serosanguineous drainage noted. There is small (1-33%) pink granulation within the wound bed. There is a large (67-100%) amount of necrotic tissue within the wound bed including Adherent Slough. Wound #9 status is Open. Original cause of wound was Gradually Appeared. The date acquired was: 10/06/2020. The wound is located on the Right,Medial Toe Fourth. The wound measures 1cm length x 1.2cm width x 0.1cm depth; 0.942cm^2 area and 0.094cm^3 volume. There is Fat Layer (Subcutaneous Tissue) exposed. There is no tunneling or undermining noted. There is a medium amount of  serosanguineous drainage noted. There is no granulation within the wound bed. There is a large (67-100%) amount of necrotic tissue within the wound bed including Eschar and Adherent Slough. 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 Bathing/ Shower/ Hygiene: Wash wounds with antibacterial soap and water. LINN, GOETZE ([id number]) May shower; gently cleanse wound with antibacterial soap, rinse and pat dry prior to dressing wounds - always change dressings immediately after shower No tub bath. Edema Control - Lymphedema / Segmental Compressive Device / Other: Elevate legs to the level of the heart and pump ankles as often as possible DO YOUR BEST to sleep in the bed at night. DO NOT sleep in your recliner. Long hours of sitting in a recliner leads to swelling of the legs and/or potential wounds on your backside. Additional Orders / Instructions: Follow Nutritious Diet and Increase Protein Intake Other: - Send current med list to wound care appt at [facility] V[facility]IT; FOLLOW DAILY DRESSING CHANGES-SEE ORDERS WOUND #1: - Lower Leg Wound Laterality: Right, Posterior Cleanser: Normal  Saline 1 x Per Day/30 Days Discharge Instructions: Wash your hands with soap and water. Remove old dressing, discard into plastic bag and place into trash. Cleanse the wound with Normal Saline prior to applying a clean dressing using gauze sponges, not tissues or cotton balls. Do not scrub or use excessive force. Pat dry using gauze sponges, not tissue or cotton balls. Primary Dressing: Silvercel 4 1/4x 4 1/4 (in/in) 1 x Per Day/30  Days Discharge Instructions: Apply Silvercel 4 1/4x 4 1/4 (in/in) as instructed Secondary Dressing: ABD Pad 5x9 (in/in) 1 x Per Day/30 Days Discharge Instructions: Cover with ABD pad Secondary Dressing: Kerlix 4.5 x 4.1 (in/yd) 1 x Per Day/30 Days Discharge Instructions: LIGHTLY COVER WITH Kerlix TO SECURE OVER SILVER ALGINATE 4.5 x 4.1 (in/yd) as instructed Secured With: 380M Medipore H Soft Cloth Surgical Tape, 2x2 (in/yd) 1 x Per Day/30 Days Discharge Instructions: TO SECURE KERLIX WOUND #10: - Toe Fourth Wound Laterality: Right, Lateral Cleanser: Betadine-swabs 1 x Per Day/30 Days Discharge Instructions: Apply Betadine as directed Cleanser: Normal Saline 1 x Per Day/30 Days Discharge Instructions: Wash your hands with soap and water. Remove old dressing, discard into plastic bag and place into trash. Cleanse the wound with Normal Saline prior to applying a clean dressing using gauze sponges, not tissues or cotton balls. Do not scrub or use excessive force. Pat dry using gauze sponges, not tissue or cotton balls. Primary Dressing: Gauze 1 x Per Day/30 Days Discharge Instructions: TO COVER WOUNDS BETEEN TOES Secondary Dressing: ABD Pad 5x9 (in/in) 1 x Per Day/30 Days Discharge Instructions: Cover with ABD pad FOR PADDING/COVERAGE Secondary Dressing: Kerlix 4.5 x 4.1 (in/yd) 1 x Per Day/30 Days Discharge Instructions: LIGHTLY Apply Kerlix 4.5 x 4.1 (in/yd) as instructed Secured With: 380M Medipore H Soft Cloth Surgical Tape, 2x2 (in/yd) 1 x Per Day/30 Days Discharge Instructions: TO HOLD KERLIX WOUND #11: - Toe Fifth Wound Laterality: Right, Medial Cleanser: Betadine-swabs 1 x Per Day/30 Days Discharge Instructions: Apply Betadine as directed Cleanser: Normal Saline 1 x Per Day/30 Days Discharge Instructions: Wash your hands with soap and water. Remove old dressing, discard into plastic bag and place into trash. Cleanse the wound with Normal Saline prior to applying a clean dressing using  gauze sponges, not tissues or cotton balls. Do not scrub or use excessive force. Pat dry using gauze sponges, not tissue or cotton balls. Primary Dressing: Gauze 1 x Per Day/30 Days Discharge Instructions: TO COVER WOUNDS BETEEN TOES Secondary Dressing: ABD Pad 5x9 (in/in) 1 x Per Day/30 Days Discharge Instructions: Cover with ABD pad FOR PADDING/COVERAGE Secondary Dressing: Kerlix 4.5 x 4.1 (in/yd) 1 x Per Day/30 Days Discharge Instructions: LIGHTLY Apply Kerlix 4.5 x 4.1 (in/yd) as instructed Secured With: 380M Medipore H Soft Cloth Surgical Tape, 2x2 (in/yd) 1 x Per Day/30 Days Discharge Instructions: TO HOLD KERLIX WOUND #12: - Toe Fifth Wound Laterality: Right, Lateral Cleanser: Betadine-swabs 1 x Per Day/30 Days Discharge Instructions: Apply Betadine as directed Cleanser: Normal Saline 1 x Per Day/30 Days Discharge Instructions: Wash your hands with soap and water. Remove old dressing, discard into plastic bag and place into trash. Cleanse the wound with Normal Saline prior to applying a clean dressing using gauze sponges, not tissues or cotton balls. Do not scrub or use excessive force. Pat dry using gauze sponges, not tissue or cotton balls. Primary Dressing: Gauze 1 x Per Day/30 Days Discharge Instructions: TO COVER WOUNDS BETEEN TOES Secondary Dressing: ABD Pad 5x9 (in/in) 1 x Per Day/30 Days Discharge  Instructions: Cover with ABD pad FOR PADDING/COVERAGE Secondary Dressing: Kerlix 4.5 x 4.1 (in/yd) 1 x Per Day/30 Days Discharge Instructions: LIGHTLY Apply Kerlix 4.5 x 4.1 (in/yd) as instructed Secured With: 87M Medipore H Soft Cloth Surgical Tape, 2x2 (in/yd) 1 x Per Day/30 Days Discharge Instructions: TO HOLD KERLIX WOUND #2: - Foot Wound Laterality: Dorsal, Right Cleanser: Betadine-swabs 1 x Per Day/30 Days Discharge Instructions: Apply Betadine as directed Cleanser: Normal Saline 1 x Per Day/30 Days Discharge Instructions: Wash your hands with soap and water. Remove old  dressing, discard into plastic bag and place into trash. Cleanse the wound with Normal Saline prior to applying a clean dressing using gauze sponges, not tissues or cotton balls. Do not scrub or use excessive force. Pat dry using gauze sponges, not tissue or cotton balls. Primary Dressing: Gauze 1 x Per Day/30 Days Discharge Instructions: TO COVER WOUNDS BETEEN TOES Secondary Dressing: ABD Pad 5x9 (in/in) 1 x Per Day/30 Days Discharge Instructions: Cover with ABD pad FOR PADDING/COVERAGE Secondary Dressing: Kerlix 4.5 x 4.1 (in/yd) 1 x Per Day/30 Days Shaneyfelt, Omare LMarland Kitchen ([id number]) Discharge Instructions: LIGHTLY Apply Kerlix 4.5 x 4.1 (in/yd) as instructed Secured With: 87M Medipore H Soft Cloth Surgical Tape, 2x2 (in/yd) 1 x Per Day/30 Days Discharge Instructions: TO HOLD KERLIX WOUND #3: - Calcaneus Wound Laterality: Right Cleanser: Betadine-swabs 1 x Per Day/30 Days Discharge Instructions: Apply Betadine as directed Cleanser: Normal Saline 1 x Per Day/30 Days Discharge Instructions: Wash your hands with soap and water. Remove old dressing, discard into plastic bag and place into trash. Cleanse the wound with Normal Saline prior to applying a clean dressing using gauze sponges, not tissues or cotton balls. Do not scrub or use excessive force. Pat dry using gauze sponges, not tissue or cotton balls. Primary Dressing: Gauze 1 x Per Day/30 Days Discharge Instructions: TO COVER WOUNDS BETEEN TOES Secondary Dressing: ABD Pad 5x9 (in/in) 1 x Per Day/30 Days Discharge Instructions: Cover with ABD pad FOR PADDING/COVERAGE Secondary Dressing: Kerlix 4.5 x 4.1 (in/yd) 1 x Per Day/30 Days Discharge Instructions: LIGHTLY Apply Kerlix 4.5 x 4.1 (in/yd) as instructed Secured With: 87M Medipore H Soft Cloth Surgical Tape, 2x2 (in/yd) 1 x Per Day/30 Days Discharge Instructions: TO HOLD KERLIX WOUND #4: - Toe Great Wound Laterality: Right Cleanser: Betadine-swabs 1 x Per Day/30 Days Discharge  Instructions: Apply Betadine as directed Cleanser: Normal Saline 1 x Per Day/30 Days Discharge Instructions: Wash your hands with soap and water. Remove old dressing, discard into plastic bag and place into trash. Cleanse the wound with Normal Saline prior to applying a clean dressing using gauze sponges, not tissues or cotton balls. Do not scrub or use excessive force. Pat dry using gauze sponges, not tissue or cotton balls. Primary Dressing: Gauze 1 x Per Day/30 Days Discharge Instructions: TO COVER WOUNDS BETEEN TOES Secondary Dressing: ABD Pad 5x9 (in/in) 1 x Per Day/30 Days Discharge Instructions: Cover with ABD pad FOR PADDING/COVERAGE Secondary Dressing: Kerlix 4.5 x 4.1 (in/yd) 1 x Per Day/30 Days Discharge Instructions: LIGHTLY Apply Kerlix 4.5 x 4.1 (in/yd) as instructed Secured With: 87M Medipore H Soft Cloth Surgical Tape, 2x2 (in/yd) 1 x Per Day/30 Days Discharge Instructions: TO HOLD KERLIX WOUND #5: - Toe Second Wound Laterality: Right, Medial Cleanser: Betadine-swabs 1 x Per Day/30 Days Discharge Instructions: Apply Betadine as directed Cleanser: Normal Saline 1 x Per Day/30 Days Discharge Instructions: Wash your hands with soap and water. Remove old dressing, discard into plastic bag and place into trash. Cleanse the  wound with Normal Saline prior to applying a clean dressing using gauze sponges, not tissues or cotton balls. Do not scrub or use excessive force. Pat dry using gauze sponges, not tissue or cotton balls. Primary Dressing: Gauze 1 x Per Day/30 Days Discharge Instructions: TO COVER WOUNDS BETEEN TOES Secondary Dressing: ABD Pad 5x9 (in/in) 1 x Per Day/30 Days Discharge Instructions: Cover with ABD pad FOR PADDING/COVERAGE Secondary Dressing: Kerlix 4.5 x 4.1 (in/yd) 1 x Per Day/30 Days Discharge Instructions: LIGHTLY Apply Kerlix 4.5 x 4.1 (in/yd) as instructed Secured With: 27M Medipore H Soft Cloth Surgical Tape, 2x2 (in/yd) 1 x Per Day/30 Days Discharge  Instructions: TO HOLD KERLIX WOUND #6: - Toe Second Wound Laterality: Right, Lateral Cleanser: Betadine-swabs 1 x Per Day/30 Days Discharge Instructions: Apply Betadine as directed Cleanser: Normal Saline 1 x Per Day/30 Days Discharge Instructions: Wash your hands with soap and water. Remove old dressing, discard into plastic bag and place into trash. Cleanse the wound with Normal Saline prior to applying a clean dressing using gauze sponges, not tissues or cotton balls. Do not scrub or use excessive force. Pat dry using gauze sponges, not tissue or cotton balls. Primary Dressing: Gauze 1 x Per Day/30 Days Discharge Instructions: TO COVER WOUNDS BETEEN TOES Secondary Dressing: ABD Pad 5x9 (in/in) 1 x Per Day/30 Days Discharge Instructions: Cover with ABD pad FOR PADDING/COVERAGE Secondary Dressing: Kerlix 4.5 x 4.1 (in/yd) 1 x Per Day/30 Days Discharge Instructions: LIGHTLY Apply Kerlix 4.5 x 4.1 (in/yd) as instructed Secured With: 27M Medipore H Soft Cloth Surgical Tape, 2x2 (in/yd) 1 x Per Day/30 Days Discharge Instructions: TO HOLD KERLIX WOUND #7: - Toe Third Wound Laterality: Right, Medial Cleanser: Betadine-swabs 1 x Per Day/30 Days Discharge Instructions: Apply Betadine as directed Cleanser: Normal Saline 1 x Per Day/30 Days Discharge Instructions: Wash your hands with soap and water. Remove old dressing, discard into plastic bag and place into trash. Cleanse the wound with Normal Saline prior to applying a clean dressing using gauze sponges, not tissues or cotton balls. Do not scrub or use excessive force. Pat dry using gauze sponges, not tissue or cotton balls. Primary Dressing: Gauze 1 x Per Day/30 Days Discharge Instructions: TO COVER WOUNDS BETEEN TOES Secondary Dressing: ABD Pad 5x9 (in/in) 1 x Per Day/30 Days Discharge Instructions: Cover with ABD pad FOR PADDING/COVERAGE Secondary Dressing: Kerlix 4.5 x 4.1 (in/yd) 1 x Per Day/30 Days Discharge Instructions: LIGHTLY Apply  Kerlix 4.5 x 4.1 (in/yd) as instructed Secured With: 27M Medipore H Soft Cloth Surgical Tape, 2x2 (in/yd) 1 x Per Day/30 Days Discharge Instructions: TO HOLD KERLIX WOUND #8: - Toe Third Wound Laterality: Right Cleanser: Betadine-swabs 1 x Per Day/30 Days Ickes, Alexis Elbert Ewings ([id number]) Discharge Instructions: Apply Betadine as directed Cleanser: Normal Saline 1 x Per Day/30 Days Discharge Instructions: Wash your hands with soap and water. Remove old dressing, discard into plastic bag and place into trash. Cleanse the wound with Normal Saline prior to applying a clean dressing using gauze sponges, not tissues or cotton balls. Do not scrub or use excessive force. Pat dry using gauze sponges, not tissue or cotton balls. Primary Dressing: Gauze 1 x Per Day/30 Days Discharge Instructions: TO COVER WOUNDS BETEEN TOES Secondary Dressing: ABD Pad 5x9 (in/in) 1 x Per Day/30 Days Discharge Instructions: Cover with ABD pad FOR PADDING/COVERAGE Secondary Dressing: Kerlix 4.5 x 4.1 (in/yd) 1 x Per Day/30 Days Discharge Instructions: LIGHTLY Apply Kerlix 4.5 x 4.1 (in/yd) as instructed Secured With: 27M Medipore H Soft Cloth Surgical  Tape, 2x2 (in/yd) 1 x Per Day/30 Days Discharge Instructions: TO HOLD KERLIX WOUND #9: - Toe Fourth Wound Laterality: Right, Medial Cleanser: Betadine-swabs 1 x Per Day/30 Days Discharge Instructions: Apply Betadine as directed Cleanser: Normal Saline 1 x Per Day/30 Days Discharge Instructions: Wash your hands with soap and water. Remove old dressing, discard into plastic bag and place into trash. Cleanse the wound with Normal Saline prior to applying a clean dressing using gauze sponges, not tissues or cotton balls. Do not scrub or use excessive force. Pat dry using gauze sponges, not tissue or cotton balls. Primary Dressing: Gauze 1 x Per Day/30 Days Discharge Instructions: TO COVER WOUNDS BETEEN TOES Secondary Dressing: ABD Pad 5x9 (in/in) 1 x Per Day/30  Days Discharge Instructions: Cover with ABD pad FOR PADDING/COVERAGE Secondary Dressing: Kerlix 4.5 x 4.1 (in/yd) 1 x Per Day/30 Days Discharge Instructions: LIGHTLY Apply Kerlix 4.5 x 4.1 (in/yd) as instructed Secured With: 69M Medipore H Soft Cloth Surgical Tape, 2x2 (in/yd) 1 x Per Day/30 Days Discharge Instructions: TO HOLD KERLIX 1. Would recommend currently that we go ahead and initiate treatment with a initiation of Betadine followed by dry gauze between the toes and then an ABD pad over the toes to protect and secure. 2. I would recommend silver alginate to the leg although on the top of the foot as well as the heel will also be using the Betadine and dry gauze. 3. I am also going to suggest that we use roll gauze secured lightly in order to help with the securing of the bandages in place. I think this is probably the best plan. 4. I did discuss with the patient that I do believe that he would benefit from close monitoring I plan to see him every 2 weeks and the dressing should be changed daily in my opinion. We will see patient back for reevaluation in 2 weeks here in the clinic. If anything worsens or changes patient will contact our office for additional recommendations. Electronic Signature(s) Signed: 10/06/2020 5:36:39 PM By: Lenda Kelp PA-C Entered By: Lenda Kelp on 10/06/2020 17:36:39 Gimbel, Dollene Cleveland ([id number]) -------------------------------------------------------------------------------- ROS/PFSH Details Patient Name: Brad Frost Date of Service: 10/06/2020 2:30 PM Medical Record Number: [id number] Patient Account Number: [id number] Date of Birth/Sex: [birth year] (65 y.o. M) Treating RN: Rogers Blocker Primary Care Provider: [name], [name] Other Clinician: Referring Provider: Eloisa Northern Treating Provider/Extender: Rowan Blase in Treatment: 0 Information Obtained From Patient Constitutional Symptoms (General Health) Complaints and  Symptoms: Negative for: Fatigue; Fever; Chills; Marked Weight Change Eyes Complaints and Symptoms: Negative for: Dry Eyes; Vision Changes; Glasses / Contacts Ear/Nose/Mouth/Throat Complaints and Symptoms: Negative for: Difficult clearing ears; Sinusitis Hematologic/Lymphatic Complaints and Symptoms: Negative for: Bleeding / Clotting Disorders; Human Immunodeficiency Virus Respiratory Complaints and Symptoms: Negative for: Chronic or frequent coughs; Shortness of Breath Cardiovascular Complaints and Symptoms: Negative for: Chest pain; LE edema Medical History: Positive for: Hypertension Gastrointestinal Complaints and Symptoms: Negative for: Frequent diarrhea; Nausea; Vomiting Endocrine Complaints and Symptoms: Negative for: Hepatitis; Thyroid disease; Polydypsia (Excessive Thirst) Genitourinary Complaints and Symptoms: Negative for: Kidney failure/ Dialysis; Incontinence/dribbling Immunological Complaints and Symptoms: Negative for: Hives; Itching Integumentary (Skin) Koon, Axyl L. ([id number]) Complaints and Symptoms: Positive for: Wounds Negative for: Bleeding or bruising tendency; Breakdown; Swelling Musculoskeletal Complaints and Symptoms: Negative for: Muscle Pain; Muscle Weakness Neurologic Complaints and Symptoms: Negative for: Numbness/parasthesias; Focal/Weakness Psychiatric Complaints and Symptoms: Negative for: Anxiety; Claustrophobia Oncologic Immunizations Pneumococcal Vaccine: Received Pneumococcal Vaccination: No Implantable Devices None Family and Social  History Current some day smoker; Alcohol Use: Never; Drug Use: No History; Caffeine Use: Moderate Electronic Signature(s) Signed: 10/06/2020 4:30:24 PM By: Phillis Haggis, Dondra Prader RN Signed: 10/06/2020 5:46:17 PM By: Lenda Kelp PA-C Entered By: Phillis Haggis, Dondra Prader on 10/06/2020 14:39:28 Hovatter, Dollene Cleveland  ([id number]) -------------------------------------------------------------------------------- SuperBill Details Patient Name: Brad Frost. Date of Service: 10/06/2020 Medical Record Number: [id number] Patient Account Number: [id number] Date of Birth/Sex: [birth year] (65 y.o. 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 of right heel, unstageable Z79.01 Long term (current) use of anticoagulants Facility Procedures CPT4 Code: 78295621 Description: 99213 - WOUND CARE VISIT-LEV 3 EST PT Modifier: Quantity: 1 Physician Procedures CPT4 Code: 3086578 Description: 99213 - WC PHYS LEVEL 3 - EST PT Modifier: Quantity: 1 CPT4 Code: Description: ICD-10 Diagnosis Description I73.89 Other specified peripheral vascular diseases I87.2 Venous insufficiency (chronic) (peripheral) 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

## 2020-10-08 NOTE — Progress Notes (Signed)
MECHEL, SCHUTTER ([id number]) Visit Report for 10/06/2020 Allergy List Details Patient Name: Brad Frost, Brad Frost. Date of Service: 10/06/2020 2:30 PM Medical Record Number: [id number] Patient Account Number: [id number] Date of Birth/Sex: 01-Aug-1955 (65 y.o. M) Treating RN: Rogers Blocker Primary Care Bani Gianfrancesco: [name], [name] Other Clinician: Referring Eniyah Eastmond: Eloisa Northern Treating Shawntae Lowy/Extender: Allen Derry Weeks in Treatment: 0 Allergies Active Allergies No Known Drug Allergies Allergy Notes Electronic Signature(s) Signed: 10/06/2020 4:30:24 PM By: Phillis Haggis, Dondra Prader RN Entered By: Phillis Haggis, Dondra Prader on 10/06/2020 14:37:30 Uehara, Dollene Cleveland ([id number]) -------------------------------------------------------------------------------- Arrival Information Details Patient Name: Brad Frost Date of Service: 10/06/2020 2:30 PM Medical Record Number: [id number] Patient Account Number: [id number] Date of Birth/Sex: [birth year] (65 y.o. M) Treating RN: Rogers Blocker Primary Care Jaimin Krupka: [name], [name] Other Clinician: Referring Marylouise Mallet: Eloisa Northern Treating Baylin Gamblin/Extender: Rowan Blase in Treatment: 0 Visit Information Patient Arrived: Wheel Chair Arrival Time: 14:31 Accompanied By: self Transfer Assistance: Manual Patient Identification Verified: Yes Secondary Verification Process Completed: Yes Electronic Signature(s) Signed: 10/06/2020 4:30:24 PM By: Phillis Haggis, Dondra Prader RN Entered By: Phillis Haggis, Dondra Prader on 10/06/2020 14:31:12 Zellmer, Dollene Cleveland ([id number]) -------------------------------------------------------------------------------- Clinic Level of Care Assessment Details Patient Name: Brad Frost. Date of Service: 10/06/2020 2:30 PM Medical Record Number: [id number] Patient Account Number: [id number] Date of Birth/Sex: 1956/04/16 (65 y.o. M) Treating RN: Hansel Feinstein Primary Care Lateasha Breuer: [name], [name] Other Clinician: Referring Daiwik Buffalo: Eloisa Northern Treating Marguarite Markov/Extender: Rowan Blase in Treatment: 0 Clinic Level of Care Assessment Items TOOL 1 Quantity Score X - Use when EandM and Procedure is performed on INITIAL visit 1 0 ASSESSMENTS - Nursing Assessment / Reassessment X - General Physical Exam (combine w/ comprehensive assessment (listed just below) when performed on new 1 20 pt. evals) X- 1 25 Comprehensive Assessment (HX, ROS, Risk Assessments, Wounds Hx, etc.) ASSESSMENTS - Wound and Skin Assessment / Reassessment []  - Dermatologic / Skin Assessment (not related to wound area) 0 ASSESSMENTS - Ostomy and/or Continence Assessment and Care []  - Incontinence Assessment and Management 0 []  - 0 Ostomy Care Assessment and Management (repouching, etc.) PROCESS - Coordination of Care []  - Simple Patient / Family Education for ongoing care 0 X- 1 20 Complex (extensive) Patient / Family Education for ongoing care X- 1 10 Staff obtains Consents, Records, Test Results / Process Orders X- 1 10 Staff telephones HHA, Nursing Homes / Clarify orders / etc []  - 0 Routine Transfer to another Facility (non-emergent condition) []  - 0 Routine Hospital Admission (non-emergent condition) X- 1 15 New Admissions / / Ordering NPWT, Apligraf, etc. []  - 0 Emergency Hospital Admission (emergent condition) PROCESS - Special Needs []  - Pediatric / Minor Patient Management 0 []  - 0 Isolation Patient Management []  - 0 Hearing / Language / Visual special needs []  - 0 Assessment of Community assistance (transportation, D/C planning, etc.) []  - 0 Additional assistance / Altered mentation []  - 0 Support Surface(s) Assessment (bed, cushion, seat, etc.) INTERVENTIONS - Miscellaneous []  - External ear exam 0 []  - 0 Patient Transfer (multiple staff / / Similar devices) []  - 0 Simple Staple / Suture removal (25 or less) []  - 0 Complex Staple / Suture removal (26 or more) []  -  0 Hypo/Hyperglycemic Management (do not check if billed separately) []  - 0 Ankle / Brachial Index (ABI) - do not check if billed separately Has the patient been seen at the hospital within the last three years: Yes Total Score: 100 Level Of Care: New/Established - Level 3  Brad Frost, Brad Frost ([id number]) Electronic Signature(s) Signed: 10/06/2020 4:11:38 PM By: Hansel Feinstein Entered By: Hansel Feinstein on 10/06/2020 15:58:11 Grim, Dollene Cleveland ([id number]) -------------------------------------------------------------------------------- Encounter Discharge Information Details Patient Name: Brad Frost Date of Service: 10/06/2020 2:30 PM Medical Record Number: [id number] Patient Account Number: [id number] Date of Birth/Sex: [birth year] (65 y.o. M) Treating RN: Yevonne Pax Primary Care Wynn Alldredge: [name], [name] Other Clinician: Referring Tonnia Bardin: Eloisa Northern Treating Kohler Pellerito/Extender: Rowan Blase in Treatment: 0 Encounter Discharge Information Items Discharge Condition: Stable Ambulatory Status: Wheelchair Discharge Destination: Skilled Nursing Facility Telephoned: No Orders Sent: Yes Transportation: Other Accompanied By: caregiver Schedule Follow-up Appointment: Yes Clinical Summary of Care: Patient Declined Electronic Signature(s) Signed: 10/08/2020 3:34:38 PM By: Yevonne Pax RN Entered By: Yevonne Pax on 10/06/2020 16:26:09 Rivet, Dollene Cleveland ([id number]) -------------------------------------------------------------------------------- Lower Extremity Assessment Details Patient Name: Brad Frost. Date of Service: 10/06/2020 2:30 PM Medical Record Number: [id number] Patient Account Number: [id number] Date of Birth/Sex: Jun 13, 1955 (65 y.o. M) Treating RN: Rogers Blocker Primary Care Kyrollos Cordell: [name], [name] Other Clinician: Referring Yassine Brunsman: Eloisa Northern Treating Burleigh Brockmann/Extender: Allen Derry Weeks in Treatment: 0 Edema Assessment Assessed: [Left: Yes] [Right:  Yes] Edema: [Left: Yes] [Right: Yes] Calf Left: Right: Point of Measurement: 28 cm From Medial Instep 41.5 cm 36 cm Ankle Left: Right: Point of Measurement: 10 cm From Medial Instep 28 cm 25.5 cm Knee To Floor Left: Right: From Medial Instep 44 cm 45 cm Vascular Assessment Pulses: Dorsalis Pedis Palpable: [Left:No Yes] [Right:No Inaudible] Electronic Signature(s) Signed: 10/06/2020 4:30:24 PM By: Phillis Haggis, Dondra Prader RN Entered By: Phillis Haggis, Dondra Prader on 10/06/2020 15:37:00 Isenberg, Dollene Cleveland ([id number]) -------------------------------------------------------------------------------- Multi Wound Chart Details Patient Name: Brad Frost. Date of Service: 10/06/2020 2:30 PM Medical Record Number: [id number] Patient Account Number: [id number] Date of Birth/Sex: 04-08-1956 (65 y.o. 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 Comorbid History: Hypertension Hypertension Hypertension Date Acquired: 10/07/2019 10/06/2020 10/06/2020 Weeks of Treatment: 0 0 0 Wound Status: Open Open Open Pending Amputation on No Yes Yes Presentation: Measurements L x W x D (cm) 10x11x0.1 1x0.8x0.1 1.2x1.3x0.1 Area (cm) : 86.394 0.628 1.225 Volume (cm) : 8.639 0.063 0.123 % Reduction in Area: 0.00% 0.00% 0.00% % Reduction in Volume: 0.00% 0.00% 0.00% Classification: Full Thickness Without Exposed Full Thickness Without Exposed Full  Thickness Without Exposed Support Structures Support Structures Support Structures Exudate Amount: Medium Medium Medium Exudate Type: Serosanguineous Serosanguineous Serosanguineous Exudate Color: red, brown red, brown red, brown Granulation Amount: Large (67-100%) Small (1-33%) Small (1-33%) Granulation Quality: Red, Pink Pink Pink Necrotic Amount: Small (1-33%) Large (67-100%) Large (67-100%) Necrotic Tissue: Eschar, Adherent Slough Adherent [employer] Exposed Structures: Fat Layer (Subcutaneous Tissue): Fat Layer (Subcutaneous Tissue): Fat Layer (Subcutaneous Tissue): Yes Yes Yes Fascia: No Fascia: No Fascia: No Tendon: No Tendon: No Tendon: No Muscle: No Muscle: No Muscle: No Joint: No Joint: No Joint: No Bone: No Bone: No Bone: No Epithelialization: Large (67-100%) None None Wound Number: 12 2 3  Photos: No Photos Radell, Brad Frost ([id number]) Wound Location: Right, Lateral Toe Fifth Right, Dorsal Foot Right Calcaneus Wounding Event: Gradually Appeared Gradually Appeared Pressure Injury 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 Comorbid History: Hypertension Hypertension Hypertension Date Acquired: 10/06/2020 10/07/2019 10/06/2020 Weeks of Treatment: 0 0 0 Wound Status: Open Open Open Pending Amputation on Yes No No Presentation: Measurements L x W x D (cm) 0.8x1x0.1 6.5x6x0.2 5.3x7.3x0.1 Area (cm) : 0.628 30.631 30.387 Volume (cm) : 0.063 6.126 3.039 % Reduction in Area: 0.00% 0.00% 0.00% % Reduction in Volume: 0.00% 0.00% 0.00% Classification: Full Thickness Without Exposed Full Thickness Without Exposed Unstageable/Unclassified Support Structures Support Structures Exudate Amount: Medium Medium None Present Exudate Type: Serosanguineous Serosanguineous N/A Exudate Color: red, brown red, brown N/A Granulation Amount: Large (67-100%) Small (1-33%) None Present (0%) Granulation Quality: Red Pink  N/A Necrotic Amount: Small (1-33%) Large (67-100%) Large (67-100%) Necrotic Tissue: Adherent Slough Eschar, Adherent Slough Adherent Slough Exposed Structures: Fat Layer (Subcutaneous Tissue): Fat Layer (Subcutaneous Tissue): Fascia: No Yes Yes Fat Layer (Subcutaneous Tissue): Fascia: No Fascia: No No Tendon: No Tendon: No Tendon: No Muscle: No Muscle: No Muscle: No Joint: No Joint: No Joint: No Bone: No Bone: No Bone: No Epithelialization: None None N/A Wound Number: Photos: Wound Location: Right Toe Great Right, Medial Toe Second Right, Lateral Toe Second Wounding Event: Gradually Appeared Gradually Appeared Gradually Appeared 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 Comorbid History: Hypertension Hypertension Hypertension Date Acquired: 10/06/2020 10/06/2020 10/06/2020 Weeks of Treatment: 0 0 0 Wound Status: Open Open Open Pending Amputation on Yes Yes Yes Presentation: Measurements L x W x D (cm) 1.5x2.7x0.2 1.9x2x0.2 2.5x1x0.2 Area (cm) : 3.181 2.985 1.963 Volume (cm) : 0.636 0.597 0.393 % Reduction in Area: 0.00% 0.00% 0.00% % Reduction in Volume: 0.00% 0.00% 0.00% Classification: Full Thickness Without Exposed Full Thickness Without Exposed Full Thickness Without Exposed Support Structures Support Structures Support Structures Exudate Amount: Medium Medium Medium Exudate Type: Serosanguineous Serosanguineous Serosanguineous Exudate Color: red, brown red, brown red, brown Granulation Amount: Small (1-33%) None Present (0%) None Present (0%) Granulation Quality: Pink N/A N/A Necrotic Amount: Large (67-100%) Large (67-100%) Large (67-100%) Necrotic Tissue: Eschar, Adherent Slough Eschar, Adherent Slough Eschar, Adherent Slough Exposed Structures: Fat Layer (Subcutaneous Tissue): Fat Layer (Subcutaneous Tissue): Fat Layer (Subcutaneous Tissue): Yes Yes Yes Fascia: No Fascia: No Fascia:  No Tendon: No Tendon: No Tendon: No Muscle: No Muscle: No Muscle: No Joint: No Joint: No Joint: No Bone: No Bone: No Bone: No Mabus, Cuauhtemoc L. 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 Comorbid History: Hypertension Hypertension Hypertension Date Acquired: 10/06/2020 10/06/2020 10/06/2020 Weeks of Treatment: 0 0 0 Wound Status: Open Open Open Pending Amputation on Yes Yes Yes Presentation: Measurements L x W x D (cm) 2.3x0.8x0.2 1.2x1.2x0.2 1x1.2x0.1 Area (cm) : 1.445 1.131 0.942 Volume (cm) : 0.289 0.226 0.094 % Reduction in Area: 0.00% 0.00% 0.00% % Reduction in Volume: 0.00% 0.00% 0.00% Classification: Full Thickness Without Exposed Full Thickness Without Exposed Full Thickness Without Exposed Support Structures Support Structures Support Structures Exudate Amount: Medium Medium Medium Exudate Type: Serosanguineous Serosanguineous Serosanguineous Exudate Color: red, brown red, brown red, brown Granulation Amount: None Present (0%) Small (1-33%) None Present (0%) Granulation Quality: N/A Pink N/A Necrotic Amount: Large (67-100%) Large (67-100%) Large (67-100%) Necrotic Tissue: Adherent Slough Adherent Slough Eschar, Adherent Slough Exposed Structures: Fat Layer (Subcutaneous Tissue): Fat Layer (Subcutaneous Tissue): Fat Layer (  Subcutaneous Tissue): Yes Yes Yes Fascia: No Fascia: No Tendon: No Tendon: No Muscle: No Muscle: No Joint: No Joint: No Bone: No Bone: No Epithelialization: None None None Treatment Notes Electronic Signature(s) Signed: 10/06/2020 4:11:38 PM By: Hansel Feinstein Entered By: Hansel Feinstein on 10/06/2020 15:57:26 Fabrizio, Dollene Cleveland  ([id number]) -------------------------------------------------------------------------------- Multi-Disciplinary Care Plan Details Patient Name: Brad Frost. Date of Service: 10/06/2020 2:30 PM Medical Record Number: [id number] Patient Account Number: [id number] Date of Birth/Sex: 02/24/1956 (65 y.o. M) Treating RN: Hansel Feinstein Primary Care Rufino Staup: [name], [name] Other Clinician: Referring Gaelen Brager: Eloisa Northern Treating Tyniya Kuyper/Extender: Rowan Blase in Treatment: 0 Active Inactive Orientation to the Wound Care Program Nursing Diagnoses: Knowledge deficit related to the wound healing center program Goals: Patient/caregiver will verbalize understanding of the Wound Healing Center Program Date Initiated: 10/06/2020 Target Resolution Date: 10/23/2020 Goal Status: Active Interventions: Provide education on orientation to the wound center Notes: Wound/Skin Impairment Nursing Diagnoses: Impaired tissue integrity Knowledge deficit related to smoking impact on wound healing Knowledge deficit related to ulceration/compromised skin integrity Goals: Ulcer/skin breakdown will have a volume reduction of 30% by week 4 Date Initiated: 10/06/2020 Target Resolution Date: 11/03/2020 Goal Status: Active Ulcer/skin breakdown will have a volume reduction of 50% by week 8 Date Initiated: 10/06/2020 Target Resolution Date: 12/01/2020 Goal Status: Active Ulcer/skin breakdown will have a volume reduction of 80% by week 12 Date Initiated: 10/06/2020 Target Resolution Date: 12/29/2020 Goal Status: Active Interventions: Assess patient/caregiver ability to obtain necessary supplies Assess patient/caregiver ability to perform ulcer/skin care regimen upon admission and as needed Assess ulceration(s) every visit Notes: Electronic Signature(s) Signed: 10/06/2020 4:11:38 PM By: Hansel Feinstein Entered By: Hansel Feinstein on 10/06/2020 15:57:13 Pompey, Dollene Cleveland  ([id number]) -------------------------------------------------------------------------------- Pain Assessment Details Patient Name: Brad Frost. Date of Service: 10/06/2020 2:30 PM Medical Record Number: [id number] Patient Account Number: [id number] Date of Birth/Sex: 21-Dec-1955 (65 y.o. M) Treating RN: Rogers Blocker Primary Care Chryl Holten: [name], [name] Other Clinician: Referring Zafir Schauer: Eloisa Northern Treating Kennady Zimmerle/Extender: Rowan Blase in Treatment: 0 Active Problems Location of Pain Severity and Description of Pain Patient Has Paino Yes Site Locations Pain Location: Pain in Ulcers Rate the pain. Current Pain Level: 6 Pain Management and Medication Current Pain Management: Electronic Signature(s) Signed: 10/06/2020 4:30:24 PM By: Phillis Haggis, Dondra Prader RN Entered By: Phillis Haggis, Kenia on 10/06/2020 14:31:24 Russi, Dollene Cleveland ([id number]) -------------------------------------------------------------------------------- Patient/Caregiver Education Details Patient Name: Brad Frost. Date of Service: 10/06/2020 2:30 PM Medical Record Number: [id number] Patient Account Number: [id number] Date of Birth/Gender: 10-05-1955 (65 y.o. M) Treating RN: Hansel Feinstein Primary Care Physician: [name], [name] Other Clinician: Referring Physician: Eloisa Northern Treating Physician/Extender: Rowan Blase in Treatment: 0 Education Assessment Education Provided To: Patient Education Topics Provided Basic Hygiene: Methods: Explain/Verbal Responses: State content correctly Infection: Offloading: Welcome To The Wound Care Center: Methods: Explain/Verbal Responses: State content correctly Wound/Skin Impairment: Methods: Explain/Verbal Responses: State content correctly Electronic Signature(s) Signed: 10/06/2020 4:11:38 PM By: Hansel Feinstein Entered By: Hansel Feinstein on 10/06/2020 15:58:48 Mooneyhan, Dollene Cleveland  ([id number]) -------------------------------------------------------------------------------- Wound Assessment Details Patient Name: Brad Frost. Date of Service: 10/06/2020 2:30 PM Medical Record Number: [id number] Patient Account Number: [id number] Date of Birth/Sex: 03/18/1956 (65 y.o. M) Treating RN: Rogers Blocker Primary Care Tanai Bouler: [name], [name] Other Clinician: Referring Myrle Dues: Eloisa Northern Treating Lorilyn Laitinen/Extender: Allen Derry Weeks in Treatment: 0 Wound Status Wound Number: 1 Primary Etiology: Venous Leg Ulcer Wound Location: Right, Posterior Lower Leg Wound Status: Open Wounding Event: Gradually Appeared Comorbid History: Hypertension Date Acquired: 10/07/2019 Tania Ade  Of Treatment: 0 Clustered Wound: No Photos Wound Measurements Length: (cm) 10 Width: (cm) 11 Depth: (cm) 0.1 Area: (cm) 86.394 Volume: (cm) 8.639 % Reduction in Area: 0% % Reduction in Volume: 0% Epithelialization: Large (67-100%) Tunneling: No Undermining: No Wound Description Classification: Full Thickness Without Exposed Support Structures Exudate Amount: Medium Exudate Type: Serosanguineous Exudate Color: red, brown Foul Odor After Cleansing: No Slough/Fibrino Yes Wound Bed Granulation Amount: Large (67-100%) Exposed Structure Granulation Quality: Red, Pink Fascia Exposed: No Necrotic Amount: Small (1-33%) Fat Layer (Subcutaneous Tissue) Exposed: Yes Necrotic Quality: Eschar, Adherent Slough Tendon Exposed: No Muscle Exposed: No Joint Exposed: No Bone Exposed: No Treatment Notes Wound #1 (Lower Leg) Wound Laterality: Right, Posterior Cleanser Normal Saline Discharge Instruction: Wash your hands with soap and water. Remove old dressing, discard into plastic bag and place into trash. Cleanse the wound with Normal Saline prior to applying a clean dressing using gauze sponges, not tissues or cotton balls. Do not scrub or use excessive force. Pat dry using gauze sponges, not  tissue or cotton balls. Jaquess, JAHLON BAINES ([id number]) Peri-Wound Care Topical Primary Dressing Silvercel 4 1/4x 4 1/4 (in/in) Discharge Instruction: Apply Silvercel 4 1/4x 4 1/4 (in/in) as instructed Secondary Dressing ABD Pad 5x9 (in/in) Discharge Instruction: Cover with ABD pad Kerlix 4.5 x 4.1 (in/yd) Discharge Instruction: LIGHTLY COVER WITH Kerlix TO SECURE OVER SILVER ALGINATE 4.5 x 4.1 (in/yd) as instructed Secured With 64M Medipore H Soft Cloth Surgical Tape, 2x2 (in/yd) Discharge Instruction: TO SECURE KERLIX Compression Wrap Compression Stockings Add-Ons Electronic Signature(s) Signed: 10/06/2020 3:40:51 PM By: Phillis Haggis, Dondra Prader RN Entered By: Phillis Haggis, Kenia on 10/06/2020 15:40:51 Spurr, Dollene Cleveland ([id number]) -------------------------------------------------------------------------------- Wound Assessment Details Patient Name: Brad Frost. Date of Service: 10/06/2020 2:30 PM Medical Record Number: [id number] Patient Account Number: [id number] Date of Birth/Sex: 27-Sep-1955 (65 y.o. M) Treating RN: Rogers Blocker Primary Care Kajuan Guyton: [name], [name] Other Clinician: Referring Kelsea Mousel: Eloisa Northern Treating Siris Hoos/Extender: Allen Derry Weeks in Treatment: 0 Wound Status Wound Number: 10 Primary Etiology: Arterial Insufficiency Ulcer Wound Location: Right Toe Fourth Wound Status: Open Wounding Event: Gradually Appeared Comorbid History: Hypertension Date Acquired: 10/06/2020 Weeks Of Treatment: 0 Clustered Wound: No Photos Wound Measurements Length: (cm) 1 Width: (cm) 0.8 Depth: (cm) 0.1 Area: (cm) 0.628 Volume: (cm) 0.063 % Reduction in Area: 0% % Reduction in Volume: 0% Epithelialization: None Tunneling: No Undermining: No Wound Description Classification: Full Thickness Without Exposed Support Structu Exudate Amount: Medium Exudate Type: Serosanguineous Exudate Color: red, brown res Wound Bed Granulation Amount: Small (1-33%)  Exposed Structure Granulation Quality: Pink Fascia Exposed: No Necrotic Amount: Large (67-100%) Fat Layer (Subcutaneous Tissue) Exposed: Yes Necrotic Quality: Adherent Slough Tendon Exposed: No Muscle Exposed: No Joint Exposed: No Bone Exposed: No Electronic Signature(s) Signed: 10/06/2020 3:54:54 PM By: Phillis Haggis, Dondra Prader RN Entered By: Phillis Haggis, Kenia on 10/06/2020 15:54:54 Hallinan, Dollene Cleveland ([id number]) -------------------------------------------------------------------------------- Wound Assessment Details Patient Name: Brad Frost. Date of Service: 10/06/2020 2:30 PM Medical Record Number: [id number] Patient Account Number: [id number] Date of Birth/Sex: [birth year] (65 y.o. M) Treating RN: Rogers Blocker Primary Care Crystal Scarberry: [name], [name] Other Clinician: Referring Rula [place]: Eloisa Northern Treating Malia Corsi/Extender: Allen Derry Weeks in Treatment: 0 Wound Status Wound Number: 11 Primary Etiology: Arterial Insufficiency Ulcer Wound Location: Right, Medial Toe Fifth Wound Status: Open Wounding Event: Gradually Appeared Comorbid History: Hypertension Date Acquired: 10/06/2020 Weeks Of Treatment: 0 Clustered Wound: No Pending Amputation On Presentation Photos Wound Measurements Length: (cm) 1.2 Width: (cm) 1.3 Depth: (cm) 0.1 Area: (cm) 1.225 Volume: (cm)  0.123 % Reduction in Area: 0% % Reduction in Volume: 0% Epithelialization: None Tunneling: No Undermining: No Wound Description Classification: Full Thickness Without Exposed Support Structu Exudate Amount: Medium Exudate Type: Serosanguineous Exudate Color: red, brown res Foul Odor After Cleansing: No Slough/Fibrino Yes Wound Bed Granulation Amount: Small (1-33%) Exposed Structure Granulation Quality: Pink Fascia Exposed: No Necrotic Amount: Large (67-100%) Fat Layer (Subcutaneous Tissue) Exposed: Yes Necrotic Quality: Adherent Slough Tendon Exposed: No Muscle Exposed: No Joint Exposed:  No Bone Exposed: No Treatment Notes Wound #11 (Toe Fifth) Wound Laterality: Right, Medial Cleanser Betadine-swabs Discharge Instruction: Apply Betadine as directed Normal Saline Constancio, [name] ([id number]) Discharge Instruction: Wash your hands with soap and water. Remove old dressing, discard into plastic bag and place into trash. Cleanse the wound with Normal Saline prior to applying a clean dressing using gauze sponges, not tissues or cotton balls. Do not scrub or use excessive force. Pat dry using gauze sponges, not tissue or cotton balls. Peri-Wound Care Topical Primary Dressing Gauze Discharge Instruction: TO COVER WOUNDS BETEEN TOES Secondary Dressing ABD Pad 5x9 (in/in) Discharge Instruction: Cover with ABD pad FOR PADDING/COVERAGE Kerlix 4.5 x 4.1 (in/yd) Discharge Instruction: LIGHTLY Apply Kerlix 4.5 x 4.1 (in/yd) as instructed Secured With 58M Medipore H Soft Cloth Surgical Tape, 2x2 (in/yd) Discharge Instruction: TO HOLD KERLIX Compression Wrap Compression Stockings Add-Ons Electronic Signature(s) Signed: 10/06/2020 3:56:49 PM By: Phillis HaggisSanchez Pereyda, Dondra PraderKenia RN Entered By: Phillis HaggisSanchez Pereyda, Kenia on 10/06/2020 15:56:49 Spratt, [name] ([id number]) -------------------------------------------------------------------------------- Wound Assessment Details Patient Name: Brad RichardENOCH, Calyn L. Date of Service: 10/06/2020 2:30 PM Medical Record Number: [id number] Patient Account Number: [id number] Date of Birth/Sex: 01/28/1956 [age] y.o. M) Treating RN: Rogers BlockerSanchez, Kenia Primary Care Dadrian Ballantine: [name], [place] Other Clinician: Referring Marijke Guadiana: Eloisa NorthernAmin, Saad Treating Darionna Banke/Extender: Allen DerryStone, Hoyt Weeks in Treatment: 0 Wound Status Wound Number: 12 Primary Etiology: Arterial Insufficiency Ulcer Wound Location: Right, Lateral Toe Fifth Wound Status: Open Wounding Event: Gradually Appeared Comorbid History: Hypertension Date Acquired: 10/06/2020 Weeks Of Treatment: 0 Clustered  Wound: No Pending Amputation On Presentation Photos Wound Measurements Length: (cm) 0.8 Width: (cm) 1 Depth: (cm) 0.1 Area: (cm) 0.628 Volume: (cm) 0.063 % Reduction in Area: 0% % Reduction in Volume: 0% Epithelialization: None Tunneling: No Undermining: No Wound Description Classification: Full Thickness Without Exposed Support Structu Exudate Amount: Medium Exudate Type: Serosanguineous Exudate Color: red, brown res Foul Odor After Cleansing: No Slough/Fibrino Yes Wound Bed Granulation Amount: Large (67-100%) Exposed Structure Granulation Quality: Red Fascia Exposed: No Necrotic Amount: Small (1-33%) Fat Layer (Subcutaneous Tissue) Exposed: Yes Necrotic Quality: Adherent Slough Tendon Exposed: No Muscle Exposed: No Joint Exposed: No Bone Exposed: No Treatment Notes Wound #12 (Toe Fifth) Wound Laterality: Right, Lateral Cleanser Betadine-swabs Discharge Instruction: Apply Betadine as directed Normal Saline Weninger, Eason Elbert EwingsL. ([id number]) Discharge Instruction: Wash your hands with soap and water. Remove old dressing, discard into plastic bag and place into trash. Cleanse the wound with Normal Saline prior to applying a clean dressing using gauze sponges, not tissues or cotton balls. Do not scrub or use excessive force. Pat dry using gauze sponges, not tissue or cotton balls. Peri-Wound Care Topical Primary Dressing Gauze Discharge Instruction: TO COVER WOUNDS BETEEN TOES Secondary Dressing ABD Pad 5x9 (in/in) Discharge Instruction: Cover with ABD pad FOR PADDING/COVERAGE Kerlix 4.5 x 4.1 (in/yd) Discharge Instruction: LIGHTLY Apply Kerlix 4.5 x 4.1 (in/yd) as instructed Secured With 58M Medipore H Soft Cloth Surgical Tape, 2x2 (in/yd) Discharge Instruction: TO HOLD KERLIX Compression Wrap Compression Stockings Add-Ons Electronic Signature(s) Signed: 10/06/2020 3:57:25 PM By:  Phillis Haggis, Kenia RN Entered By: Phillis Haggis, Kenia on 10/06/2020  15:57:25 Frayre, Dollene Cleveland ([id number]) -------------------------------------------------------------------------------- Wound Assessment Details Patient Name: Brad Frost, Brad Frost. Date of Service: 10/06/2020 2:30 PM Medical Record Number: [id number] Patient Account Number: [id number] Date of Birth/Sex: [birth year] (65 y.o. M) Treating RN: Rogers Blocker Primary Care Jaaziah Schulke: [name], [name] Other Clinician: Referring Dakotah Orrego: Eloisa Northern Treating Reade Trefz/Extender: Allen Derry Weeks in Treatment: 0 Wound Status Wound Number: 2 Primary Etiology: Arterial Insufficiency Ulcer Wound Location: Right, Dorsal Foot Wound Status: Open Wounding Event: Gradually Appeared Comorbid History: Hypertension Date Acquired: 10/07/2019 Weeks Of Treatment: 0 Clustered Wound: No Photos Wound Measurements Length: (cm) 6.5 Width: (cm) 6 Depth: (cm) 0.2 Area: (cm) 30.631 Volume: (cm) 6.126 % Reduction in Area: 0% % Reduction in Volume: 0% Epithelialization: None Tunneling: No Undermining: No Wound Description Classification: Full Thickness Without Exposed Support Structures Exudate Amount: Medium Exudate Type: Serosanguineous Exudate Color: red, brown Foul Odor After Cleansing: No Slough/Fibrino Yes Wound Bed Granulation Amount: Small (1-33%) Exposed Structure Granulation Quality: Pink Fascia Exposed: No Necrotic Amount: Large (67-100%) Fat Layer (Subcutaneous Tissue) Exposed: Yes Necrotic Quality: Eschar, Adherent Slough Tendon Exposed: No Muscle Exposed: No Joint Exposed: No Bone Exposed: No Treatment Notes Wound #2 (Foot) Wound Laterality: Dorsal, Right Cleanser Betadine-swabs Discharge Instruction: Apply Betadine as directed Normal Saline Discharge Instruction: Wash your hands with soap and water. Remove old dressing, discard into plastic bag and place into trash. Cleanse the wound with Normal Saline prior to applying a clean dressing using gauze sponges, not tissues or cotton balls.  Do not Crombie, Alfio L. ([id number]) scrub or use excessive force. Pat dry using gauze sponges, not tissue or cotton balls. Peri-Wound Care Topical Primary Dressing Gauze Discharge Instruction: TO COVER WOUNDS BETEEN TOES Secondary Dressing ABD Pad 5x9 (in/in) Discharge Instruction: Cover with ABD pad FOR PADDING/COVERAGE Kerlix 4.5 x 4.1 (in/yd) Discharge Instruction: LIGHTLY Apply Kerlix 4.5 x 4.1 (in/yd) as instructed Secured With 69M Medipore H Soft Cloth Surgical Tape, 2x2 (in/yd) Discharge Instruction: TO HOLD KERLIX Compression Wrap Compression Stockings Add-Ons Electronic Signature(s) Signed: 10/06/2020 3:41:42 PM By: Phillis Haggis, Dondra Prader RN Entered By: Phillis Haggis, Kenia on 10/06/2020 15:41:41 Queenan, Dollene Cleveland ([id number]) -------------------------------------------------------------------------------- Wound Assessment Details Patient Name: Brad Frost. Date of Service: 10/06/2020 2:30 PM Medical Record Number: [id number] Patient Account Number: [id number] Date of Birth/Sex: 03/21/1956 (65 y.o. M) Treating RN: Rogers Blocker Primary Care Shakiya Mcneary: [name], [name] Other Clinician: Referring Deaisa Merida: Eloisa Northern Treating Makayla Lanter/Extender: Allen Derry Weeks in Treatment: 0 Wound Status Wound Number: 3 Primary Etiology: Pressure Ulcer Wound Location: Right Calcaneus Wound Status: Open Wounding Event: Pressure Injury Comorbid History: Hypertension Date Acquired: 10/06/2020 Weeks Of Treatment: 0 Clustered Wound: No Photos Wound Measurements Length: (cm) 5.3 Width: (cm) 7.3 Depth: (cm) 0.1 Area: (cm) 30.387 Volume: (cm) 3.039 % Reduction in Area: 0% % Reduction in Volume: 0% Tunneling: No Undermining: No Wound Description Classification: Unstageable/Unclassified Exudate Amount: None Present Foul Odor After Cleansing: No Slough/Fibrino No Wound Bed Granulation Amount: None Present (0%) Exposed Structure Necrotic Amount: Large (67-100%) Fascia  Exposed: No Necrotic Quality: Adherent Slough Fat Layer (Subcutaneous Tissue) Exposed: No Tendon Exposed: No Muscle Exposed: No Joint Exposed: No Bone Exposed: No Treatment Notes Wound #3 (Calcaneus) Wound Laterality: Right Cleanser Betadine-swabs Discharge Instruction: Apply Betadine as directed Normal Saline Discharge Instruction: Wash your hands with soap and water. Remove old dressing, discard into plastic bag and place into trash. Cleanse the wound with Normal Saline prior to applying a clean dressing using gauze sponges, not  tissues or cotton balls. Do not Voit, Jaydrian L. ([id number]) scrub or use excessive force. Pat dry using gauze sponges, not tissue or cotton balls. Peri-Wound Care Topical Primary Dressing Gauze Discharge Instruction: TO COVER WOUNDS BETEEN TOES Secondary Dressing ABD Pad 5x9 (in/in) Discharge Instruction: Cover with ABD pad FOR PADDING/COVERAGE Kerlix 4.5 x 4.1 (in/yd) Discharge Instruction: LIGHTLY Apply Kerlix 4.5 x 4.1 (in/yd) as instructed Secured With 20M Medipore H Soft Cloth Surgical Tape, 2x2 (in/yd) Discharge Instruction: TO HOLD KERLIX Compression Wrap Compression Stockings Add-Ons Electronic Signature(s) Signed: 10/06/2020 3:42:24 PM By: Phillis Haggis, Dondra Prader RN Entered By: Phillis Haggis, Kenia on 10/06/2020 15:42:24 Frei, Dollene Cleveland ([id number]) -------------------------------------------------------------------------------- Wound Assessment Details Patient Name: Brad Frost. Date of Service: 10/06/2020 2:30 PM Medical Record Number: [id number] Patient Account Number: [id number] Date of Birth/Sex: [birth year] (65 y.o. M) Treating RN: Rogers Blocker Primary Care Drexel Ivey: [name], [name] Other Clinician: Referring Clive Parcel: Eloisa Northern Treating Jakobee Brackins/Extender: Allen Derry Weeks in Treatment: 0 Wound Status Wound Number: 4 Primary Etiology: Arterial Insufficiency Ulcer Wound Location: Right Toe Great Wound Status:  Open Wounding Event: Gradually Appeared Comorbid History: Hypertension Date Acquired: 10/06/2020 Weeks Of Treatment: 0 Clustered Wound: No Pending Amputation On Presentation Photos Wound Measurements Length: (cm) 1.5 Width: (cm) 2.7 Depth: (cm) 0.2 Area: (cm) 3.181 Volume: (cm) 0.636 % Reduction in Area: 0% % Reduction in Volume: 0% Epithelialization: None Tunneling: No Undermining: No Wound Description Classification: Full Thickness Without Exposed Support Structu Exudate Amount: Medium Exudate Type: Serosanguineous Exudate Color: red, brown res Foul Odor After Cleansing: No Slough/Fibrino Yes Wound Bed Granulation Amount: Small (1-33%) Exposed Structure Granulation Quality: Pink Fascia Exposed: No Necrotic Amount: Large (67-100%) Fat Layer (Subcutaneous Tissue) Exposed: Yes Necrotic Quality: Eschar, Adherent Slough Tendon Exposed: No Muscle Exposed: No Joint Exposed: No Bone Exposed: No Treatment Notes Wound #4 (Toe Great) Wound Laterality: Right Cleanser Betadine-swabs Discharge Instruction: Apply Betadine as directed Normal Saline Castleman, Dollene Cleveland ([id number]) Discharge Instruction: Wash your hands with soap and water. Remove old dressing, discard into plastic bag and place into trash. Cleanse the wound with Normal Saline prior to applying a clean dressing using gauze sponges, not tissues or cotton balls. Do not scrub or use excessive force. Pat dry using gauze sponges, not tissue or cotton balls. Peri-Wound Care Topical Primary Dressing Gauze Discharge Instruction: TO COVER WOUNDS BETEEN TOES Secondary Dressing ABD Pad 5x9 (in/in) Discharge Instruction: Cover with ABD pad FOR PADDING/COVERAGE Kerlix 4.5 x 4.1 (in/yd) Discharge Instruction: LIGHTLY Apply Kerlix 4.5 x 4.1 (in/yd) as instructed Secured With 20M Medipore H Soft Cloth Surgical Tape, 2x2 (in/yd) Discharge Instruction: TO HOLD KERLIX Compression Wrap Compression  Stockings Add-Ons Electronic Signature(s) Signed: 10/06/2020 3:43:19 PM By: Phillis Haggis, Dondra Prader RN Entered By: Phillis Haggis, Kenia on 10/06/2020 15:43:18 Sinor, Dollene Cleveland ([id number]) -------------------------------------------------------------------------------- Wound Assessment Details Patient Name: Brad Frost. Date of Service: 10/06/2020 2:30 PM Medical Record Number: [id number] Patient Account Number: [id number] Date of Birth/Sex: 1955-10-03 (65 y.o. M) Treating RN: Rogers Blocker Primary Care Saraih Lorton: [name], [name] Other Clinician: Referring Trenity Pha: Eloisa Northern Treating Aleila Syverson/Extender: Allen Derry Weeks in Treatment: 0 Wound Status Wound Number: 5 Primary Etiology: Arterial Insufficiency Ulcer Wound Location: Right, Medial Toe Second Wound Status: Open Wounding Event: Gradually Appeared Comorbid History: Hypertension Date Acquired: 10/06/2020 Weeks Of Treatment: 0 Clustered Wound: No Pending Amputation On Presentation Photos Wound Measurements Length: (cm) 1.9 Width: (cm) 2 Depth: (cm) 0.2 Area: (cm) 2.985 Volume: (cm) 0.597 % Reduction in Area: 0% % Reduction in Volume: 0% Epithelialization: None Tunneling: No  Undermining: No Wound Description Classification: Full Thickness Without Exposed Support Structu Exudate Amount: Medium Exudate Type: Serosanguineous Exudate Color: red, brown res Foul Odor After Cleansing: No Slough/Fibrino Yes Wound Bed Granulation Amount: None Present (0%) Exposed Structure Necrotic Amount: Large (67-100%) Fascia Exposed: No Necrotic Quality: Eschar, Adherent Slough Fat Layer (Subcutaneous Tissue) Exposed: Yes Tendon Exposed: No Muscle Exposed: No Joint Exposed: No Bone Exposed: No Treatment Notes Wound #5 (Toe Second) Wound Laterality: Right, Medial Cleanser Betadine-swabs Discharge Instruction: Apply Betadine as directed Normal Saline Dai, Dollene Cleveland ([id number]) Discharge Instruction: Wash your hands  with soap and water. Remove old dressing, discard into plastic bag and place into trash. Cleanse the wound with Normal Saline prior to applying a clean dressing using gauze sponges, not tissues or cotton balls. Do not scrub or use excessive force. Pat dry using gauze sponges, not tissue or cotton balls. Peri-Wound Care Topical Primary Dressing Gauze Discharge Instruction: TO COVER WOUNDS BETEEN TOES Secondary Dressing ABD Pad 5x9 (in/in) Discharge Instruction: Cover with ABD pad FOR PADDING/COVERAGE Kerlix 4.5 x 4.1 (in/yd) Discharge Instruction: LIGHTLY Apply Kerlix 4.5 x 4.1 (in/yd) as instructed Secured With 28M Medipore H Soft Cloth Surgical Tape, 2x2 (in/yd) Discharge Instruction: TO HOLD KERLIX Compression Wrap Compression Stockings Add-Ons Electronic Signature(s) Signed: 10/06/2020 3:44:04 PM By: Phillis Haggis, Dondra Prader RN Entered By: Phillis Haggis, Kenia on 10/06/2020 15:44:04 Mcgovern, Dollene Cleveland ([id number]) -------------------------------------------------------------------------------- Wound Assessment Details Patient Name: Brad Frost. Date of Service: 10/06/2020 2:30 PM Medical Record Number: [id number] Patient Account Number: [id number] Date of Birth/Sex: August 09, 1955 (65 y.o. M) Treating RN: Rogers Blocker Primary Care Beanca Kiester: [name], [name] Other Clinician: Referring Dereona Kolodny: Eloisa Northern Treating Adele Milson/Extender: Allen Derry Weeks in Treatment: 0 Wound Status Wound Number: 6 Primary Etiology: Arterial Insufficiency Ulcer Wound Location: Right, Lateral Toe Second Wound Status: Open Wounding Event: Gradually Appeared Comorbid History: Hypertension Date Acquired: 10/06/2020 Weeks Of Treatment: 0 Clustered Wound: No Pending Amputation On Presentation Photos Wound Measurements Length: (cm) 2.5 Width: (cm) 1 Depth: (cm) 0.2 Area: (cm) 1.963 Volume: (cm) 0.393 % Reduction in Area: 0% % Reduction in Volume: 0% Epithelialization: None Tunneling:  No Undermining: No Wound Description Classification: Full Thickness Without Exposed Support Structu Exudate Amount: Medium Exudate Type: Serosanguineous Exudate Color: red, brown res Foul Odor After Cleansing: No Slough/Fibrino Yes Wound Bed Granulation Amount: None Present (0%) Exposed Structure Necrotic Amount: Large (67-100%) Fascia Exposed: No Necrotic Quality: Eschar, Adherent Slough Fat Layer (Subcutaneous Tissue) Exposed: Yes Tendon Exposed: No Muscle Exposed: No Joint Exposed: No Bone Exposed: No Treatment Notes Wound #6 (Toe Second) Wound Laterality: Right, Lateral Cleanser Betadine-swabs Discharge Instruction: Apply Betadine as directed Normal Saline Gusler, Dollene Cleveland ([id number]) Discharge Instruction: Wash your hands with soap and water. Remove old dressing, discard into plastic bag and place into trash. Cleanse the wound with Normal Saline prior to applying a clean dressing using gauze sponges, not tissues or cotton balls. Do not scrub or use excessive force. Pat dry using gauze sponges, not tissue or cotton balls. Peri-Wound Care Topical Primary Dressing Gauze Discharge Instruction: TO COVER WOUNDS BETEEN TOES Secondary Dressing ABD Pad 5x9 (in/in) Discharge Instruction: Cover with ABD pad FOR PADDING/COVERAGE Kerlix 4.5 x 4.1 (in/yd) Discharge Instruction: LIGHTLY Apply Kerlix 4.5 x 4.1 (in/yd) as instructed Secured With 28M Medipore H Soft Cloth Surgical Tape, 2x2 (in/yd) Discharge Instruction: TO HOLD KERLIX Compression Wrap Compression Stockings Add-Ons Electronic Signature(s) Signed: 10/06/2020 3:45:40 PM By: Lajean Manes RN Previous Signature: 10/06/2020 3:44:47 PM Version By: Phillis Haggis, Dondra Prader RN Entered By:  Phillis Haggis, Kenia on 10/06/2020 15:45:40 Calvi, Dollene Cleveland ([id number]) -------------------------------------------------------------------------------- Wound Assessment Details Patient Name: Brad Frost, ZIETLOW. Date of  Service: 10/06/2020 2:30 PM Medical Record Number: [id number] Patient Account Number: [id number] Date of Birth/Sex: [birth year] (65 y.o. M) Treating RN: Rogers Blocker Primary Care Le Ferraz: [name], [name] Other Clinician: Referring Melquiades Kovar: Eloisa Northern Treating Tacey Dimaggio/Extender: Allen Derry Weeks in Treatment: 0 Wound Status Wound Number: 7 Primary Etiology: Arterial Insufficiency Ulcer Wound Location: Right, Medial Toe Third Wound Status: Open Wounding Event: Gradually Appeared Comorbid History: Hypertension Date Acquired: 10/06/2020 Weeks Of Treatment: 0 Clustered Wound: No Pending Amputation On Presentation Photos Wound Measurements Length: (cm) 2.3 Width: (cm) 0.8 Depth: (cm) 0.2 Area: (cm) 1.445 Volume: (cm) 0.289 % Reduction in Area: 0% % Reduction in Volume: 0% Epithelialization: None Tunneling: No Undermining: No Wound Description Classification: Full Thickness Without Exposed Support Structu Exudate Amount: Medium Exudate Type: Serosanguineous Exudate Color: red, brown res Foul Odor After Cleansing: No Slough/Fibrino Yes Wound Bed Granulation Amount: None Present (0%) Exposed Structure Necrotic Amount: Large (67-100%) Fascia Exposed: No Necrotic Quality: Adherent Slough Fat Layer (Subcutaneous Tissue) Exposed: Yes Tendon Exposed: No Muscle Exposed: No Joint Exposed: No Bone Exposed: No Treatment Notes Wound #7 (Toe Third) Wound Laterality: Right, Medial Cleanser Betadine-swabs Discharge Instruction: Apply Betadine as directed Normal Saline Dunshee, Dollene Cleveland ([id number]) Discharge Instruction: Wash your hands with soap and water. Remove old dressing, discard into plastic bag and place into trash. Cleanse the wound with Normal Saline prior to applying a clean dressing using gauze sponges, not tissues or cotton balls. Do not scrub or use excessive force. Pat dry using gauze sponges, not tissue or cotton balls. Peri-Wound Care Topical Primary  Dressing Gauze Discharge Instruction: TO COVER WOUNDS BETEEN TOES Secondary Dressing ABD Pad 5x9 (in/in) Discharge Instruction: Cover with ABD pad FOR PADDING/COVERAGE Kerlix 4.5 x 4.1 (in/yd) Discharge Instruction: LIGHTLY Apply Kerlix 4.5 x 4.1 (in/yd) as instructed Secured With 11M Medipore H Soft Cloth Surgical Tape, 2x2 (in/yd) Discharge Instruction: TO HOLD KERLIX Compression Wrap Compression Stockings Add-Ons Electronic Signature(s) Signed: 10/06/2020 3:46:30 PM By: Phillis Haggis, Dondra Prader RN Entered By: Phillis Haggis, Kenia on 10/06/2020 15:46:29 Chipps, Dollene Cleveland ([id number]) -------------------------------------------------------------------------------- Wound Assessment Details Patient Name: Brad Frost. Date of Service: 10/06/2020 2:30 PM Medical Record Number: [id number] Patient Account Number: [id number] Date of Birth/Sex: [birth year] (65 y.o. M) Treating RN: Rogers Blocker Primary Care Khalil Szczepanik: [name], [name] Other Clinician: Referring Corydon Schweiss: Eloisa Northern Treating Tekeisha Hakim/Extender: Allen Derry Weeks in Treatment: 0 Wound Status Wound Number: 8 Primary Etiology: Arterial Insufficiency Ulcer Wound Location: Right Toe Third Wound Status: Open Wounding Event: Gradually Appeared Comorbid History: Hypertension Date Acquired: 10/06/2020 Weeks Of Treatment: 0 Clustered Wound: No Pending Amputation On Presentation Photos Wound Measurements Length: (cm) 1.2 Width: (cm) 1.2 Depth: (cm) 0.2 Area: (cm) 1.131 Volume: (cm) 0.226 % Reduction in Area: 0% % Reduction in Volume: 0% Epithelialization: None Tunneling: No Undermining: No Wound Description Classification: Full Thickness Without Exposed Support Structu Exudate Amount: Medium Exudate Type: Serosanguineous Exudate Color: red, brown res Foul Odor After Cleansing: No Slough/Fibrino Yes Wound Bed Granulation Amount: Small (1-33%) Exposed Structure Granulation Quality: Pink Fascia Exposed:  No Necrotic Amount: Large (67-100%) Fat Layer (Subcutaneous Tissue) Exposed: Yes Necrotic Quality: Adherent Slough Tendon Exposed: No Muscle Exposed: No Joint Exposed: No Bone Exposed: No Electronic Signature(s) Signed: 10/06/2020 3:47:29 PM By: Lajean Manes RN Previous Signature: 10/06/2020 3:47:09 PM Version By: Phillis Haggis, Dondra Prader RN Entered By: Phillis Haggis, Kenia on 10/06/2020 15:47:29 Harpenau, Dollene Cleveland ([id number]) --------------------------------------------------------------------------------  Wound Assessment Details Patient Name: Brad Frost, Brad Frost. Date of Service: 10/06/2020 2:30 PM Medical Record Number: [id number] Patient Account Number: [id number] Date of Birth/Sex: 02/28/1956 (65 y.o. M) Treating RN: Rogers Blocker Primary Care Bernis Schreur: [name], [name] Other Clinician: Referring Jarquavious Fentress: Eloisa Northern Treating Jerris Fleer/Extender: Allen Derry Weeks in Treatment: 0 Wound Status Wound Number: 9 Primary Etiology: Arterial Insufficiency Ulcer Wound Location: Right, Medial Toe Fourth Wound Status: Open Wounding Event: Gradually Appeared Comorbid History: Hypertension Date Acquired: 10/06/2020 Weeks Of Treatment: 0 Clustered Wound: No Photos Wound Measurements Length: (cm) 1 Width: (cm) 1.2 Depth: (cm) 0.1 Area: (cm) 0.942 Volume: (cm) 0.094 % Reduction in Area: 0% % Reduction in Volume: 0% Epithelialization: None Tunneling: No Undermining: No Wound Description Classification: Full Thickness Without Exposed Support Structu Exudate Amount: Medium Exudate Type: Serosanguineous Exudate Color: red, brown res Foul Odor After Cleansing: No Slough/Fibrino Yes Wound Bed Granulation Amount: None Present (0%) Exposed Structure Necrotic Amount: Large (67-100%) Fat Layer (Subcutaneous Tissue) Exposed: Yes Necrotic Quality: Eschar, Adherent Scientist, physiological) Signed: 10/06/2020 3:48:27 PM By: Phillis Haggis, Dondra Prader RN Entered By: Phillis Haggis, Dondra Prader on 10/06/2020 15:48:26 Diliberto, Dollene Cleveland ([id number]) -------------------------------------------------------------------------------- Vitals Details Patient Name: Brad Frost. Date of Service: 10/06/2020 2:30 PM Medical Record Number: [id number] Patient Account Number: [id number] Date of Birth/Sex: 10-23-1955 (65 y.o. M) Treating RN: Rogers Blocker Primary Care Emmersen Garraway: [name], [name] Other Clinician: Referring Sokhna Christoph: Eloisa Northern Treating Kennie Karapetian/Extender: Rowan Blase in Treatment: 0 Vital Signs Time Taken: 14:34 Temperature (F): 98.4 Height (in): 70 Pulse (bpm): 83 Source: Stated Respiratory Rate (breaths/min): 18 Weight (lbs): 172 Blood Pressure (mmHg): 103/58 Source: Stated Reference Range: 80 - 120 mg / dl Body Mass Index (BMI): 24.7 Electronic Signature(s) Signed: 10/06/2020 4:30:24 PM By: Phillis Haggis, Dondra Prader RN Entered By: Phillis Haggis, Dondra Prader on 10/06/2020 14:37:06

## 2020-10-09 ENCOUNTER — Other Ambulatory Visit: Payer: Self-pay

## 2020-10-09 ENCOUNTER — Emergency Department: Payer: No Typology Code available for payment source

## 2020-10-09 ENCOUNTER — Inpatient Hospital Stay
Admission: EM | Admit: 2020-10-09 | Discharge: 2020-10-14 | DRG: 300 | Disposition: A | Discharge status: Skilled Nursing Facility | Payer: No Typology Code available for payment source | Source: Skilled Nursing Facility | Attending: Internal Medicine | Admitting: Internal Medicine

## 2020-10-09 DIAGNOSIS — I70261 Atherosclerosis of native arteries of extremities with gangrene, right leg: Secondary | ICD-10-CM | POA: Diagnosis present

## 2020-10-09 DIAGNOSIS — Z8249 Family history of ischemic heart disease and other diseases of the circulatory system: Secondary | ICD-10-CM | POA: Diagnosis not present

## 2020-10-09 DIAGNOSIS — I5032 Chronic diastolic (congestive) heart failure: Secondary | ICD-10-CM | POA: Diagnosis present

## 2020-10-09 DIAGNOSIS — I7092 Chronic total occlusion of artery of the extremities: Secondary | ICD-10-CM | POA: Diagnosis not present

## 2020-10-09 DIAGNOSIS — M8580 Other specified disorders of bone density and structure, unspecified site: Secondary | ICD-10-CM | POA: Diagnosis present

## 2020-10-09 DIAGNOSIS — E11621 Type 2 diabetes mellitus with foot ulcer: Secondary | ICD-10-CM | POA: Diagnosis present

## 2020-10-09 DIAGNOSIS — E1140 Type 2 diabetes mellitus with diabetic neuropathy, unspecified: Secondary | ICD-10-CM | POA: Diagnosis present

## 2020-10-09 DIAGNOSIS — I739 Peripheral vascular disease, unspecified: Secondary | ICD-10-CM

## 2020-10-09 DIAGNOSIS — I110 Hypertensive heart disease with heart failure: Secondary | ICD-10-CM | POA: Diagnosis present

## 2020-10-09 DIAGNOSIS — Z8616 Personal history of COVID-19: Secondary | ICD-10-CM | POA: Diagnosis not present

## 2020-10-09 DIAGNOSIS — Z9114 Patient's other noncompliance with medication regimen: Secondary | ICD-10-CM

## 2020-10-09 DIAGNOSIS — I96 Gangrene, not elsewhere classified: Secondary | ICD-10-CM | POA: Diagnosis present

## 2020-10-09 DIAGNOSIS — E1152 Type 2 diabetes mellitus with diabetic peripheral angiopathy with gangrene: Principal | ICD-10-CM | POA: Diagnosis present

## 2020-10-09 DIAGNOSIS — F17210 Nicotine dependence, cigarettes, uncomplicated: Secondary | ICD-10-CM | POA: Diagnosis present

## 2020-10-09 DIAGNOSIS — Z20822 Contact with and (suspected) exposure to covid-19: Secondary | ICD-10-CM | POA: Diagnosis present

## 2020-10-09 DIAGNOSIS — L03119 Cellulitis of unspecified part of limb: Secondary | ICD-10-CM | POA: Diagnosis present

## 2020-10-09 DIAGNOSIS — D638 Anemia in other chronic diseases classified elsewhere: Secondary | ICD-10-CM | POA: Diagnosis present

## 2020-10-09 LAB — CBC WITH DIFFERENTIAL/PLATELET
Abs Immature Granulocytes: 0.03 10*3/uL (ref 0.00–0.07)
Basophils Absolute: 0 10*3/uL (ref 0.0–0.1)
Basophils Relative: 0 %
Eosinophils Absolute: 0.9 10*3/uL — ABNORMAL HIGH (ref 0.0–0.5)
Eosinophils Relative: 10 %
HCT: 29.3 % — ABNORMAL LOW (ref 39.0–52.0)
Hemoglobin: 8.7 g/dL — ABNORMAL LOW (ref 13.0–17.0)
Immature Granulocytes: 0 %
Lymphocytes Relative: 18 %
Lymphs Abs: 1.6 10*3/uL (ref 0.7–4.0)
MCH: 24 pg — ABNORMAL LOW (ref 26.0–34.0)
MCHC: 29.7 g/dL — ABNORMAL LOW (ref 30.0–36.0)
MCV: 80.7 fL (ref 80.0–100.0)
Monocytes Absolute: 0.9 10*3/uL (ref 0.1–1.0)
Monocytes Relative: 10 %
Neutro Abs: 5.5 10*3/uL (ref 1.7–7.7)
Neutrophils Relative %: 62 %
Platelets: 582 10*3/uL — ABNORMAL HIGH (ref 150–400)
RBC: 3.63 MIL/uL — ABNORMAL LOW (ref 4.22–5.81)
RDW: 22.1 % — ABNORMAL HIGH (ref 11.5–15.5)
Smear Review: NORMAL
WBC: 8.9 10*3/uL (ref 4.0–10.5)
nRBC: 0 % (ref 0.0–0.2)

## 2020-10-09 LAB — COMPREHENSIVE METABOLIC PANEL
ALT: 11 U/L (ref 0–44)
AST: 15 U/L (ref 15–41)
Albumin: 2.4 g/dL — ABNORMAL LOW (ref 3.5–5.0)
Alkaline Phosphatase: 73 U/L (ref 38–126)
Anion gap: 8 (ref 5–15)
BUN: 6 mg/dL — ABNORMAL LOW (ref 8–23)
CO2: 23 mmol/L (ref 22–32)
Calcium: 8.3 mg/dL — ABNORMAL LOW (ref 8.9–10.3)
Chloride: 104 mmol/L (ref 98–111)
Creatinine, Ser: 0.82 mg/dL (ref 0.61–1.24)
GFR, Estimated: >60 mL/min (ref >60)
Glucose, Bld: 103 mg/dL — ABNORMAL HIGH (ref 70–99)
Potassium: 4 mmol/L (ref 3.5–5.1)
Sodium: 135 mmol/L (ref 135–145)
Total Bilirubin: 0.5 mg/dL (ref 0.3–1.2)
Total Protein: 7.4 g/dL (ref 6.5–8.1)

## 2020-10-09 LAB — RESP PANEL BY RT-PCR (FLU A&B, COVID) ARPGX2
Influenza A by PCR: NEGATIVE
Influenza B by PCR: NEGATIVE
SARS Coronavirus 2 by RT PCR: NEGATIVE

## 2020-10-09 LAB — LACTIC ACID, PLASMA: Lactic Acid, Venous: 1.8 mmol/L (ref 0.5–1.9)

## 2020-10-09 LAB — CBG MONITORING, ED: Glucose-Capillary: 84 mg/dL (ref 70–99)

## 2020-10-09 MED ORDER — MORPHINE SULFATE (PF) 4 MG/ML IV SOLN
4.0000 mg | Freq: Once | INTRAVENOUS | Status: AC
  Administered 2020-10-09: 4 mg via INTRAVENOUS
  Filled 2020-10-09: qty 1

## 2020-10-09 MED ORDER — HEPARIN (PORCINE) 25000 UT/250ML-% IV SOLN
1200.0000 [IU]/h | INTRAVENOUS | Status: DC
  Administered 2020-10-09: 1200 [IU]/h via INTRAVENOUS
  Filled 2020-10-09 (×2): qty 250

## 2020-10-09 MED ORDER — HYDROCODONE-ACETAMINOPHEN 5-325 MG PO TABS: 1.0000 | ORAL_TABLET | Freq: Four times a day (QID) | ORAL | Status: DC | PRN

## 2020-10-09 MED ORDER — PIPERACILLIN-TAZOBACTAM 3.375 G IVPB 30 MIN
3.3750 g | Freq: Once | INTRAVENOUS | Status: AC
  Administered 2020-10-09: 3.375 g via INTRAVENOUS
  Filled 2020-10-09: qty 50

## 2020-10-09 MED ORDER — SODIUM CHLORIDE 0.9 % IV SOLN
2.0000 g | Freq: Once | INTRAVENOUS | Status: DC
  Administered 2020-10-09: 2 g via INTRAVENOUS
  Filled 2020-10-09: qty 20

## 2020-10-09 MED ORDER — SODIUM CHLORIDE 0.9 % IV BOLUS
1000.0000 mL | Freq: Once | INTRAVENOUS | Status: AC
  Administered 2020-10-09: 1000 mL via INTRAVENOUS

## 2020-10-09 MED ORDER — ASPIRIN EC 81 MG PO TBEC
81.0000 mg | DELAYED_RELEASE_TABLET | Freq: Every day | ORAL | Status: DC
  Administered 2020-10-09 – 2020-10-14 (×5): 81 mg via ORAL
  Filled 2020-10-09 (×8): qty 1

## 2020-10-09 MED ORDER — CEFAZOLIN SODIUM-DEXTROSE 1-4 GM/50ML-% IV SOLN
1.0000 g | Freq: Three times a day (TID) | INTRAVENOUS | Status: DC
  Administered 2020-10-10 – 2020-10-13 (×9): 1 g via INTRAVENOUS
  Filled 2020-10-09 (×13): qty 50

## 2020-10-09 MED ORDER — HEPARIN BOLUS VIA INFUSION
5000.0000 [IU] | Freq: Once | INTRAVENOUS | Status: AC
  Administered 2020-10-09: 5000 [IU] via INTRAVENOUS
  Filled 2020-10-09: qty 5000

## 2020-10-09 MED ORDER — VANCOMYCIN HCL IN DEXTROSE 1-5 GM/200ML-% IV SOLN
1000.0000 mg | Freq: Once | INTRAVENOUS | Status: AC
Start: 2020-10-09 — End: 2020-10-09
  Administered 2020-10-09: 1000 mg via INTRAVENOUS
  Filled 2020-10-09: qty 200

## 2020-10-09 MED ORDER — ACETAMINOPHEN 325 MG PO TABS
650.0000 mg | ORAL_TABLET | ORAL | Status: DC | PRN
  Administered 2020-10-11 – 2020-10-12 (×3): 650 mg via ORAL
  Filled 2020-10-09 (×3): qty 2

## 2020-10-09 MED ORDER — SODIUM CHLORIDE 0.9 % IV SOLN: INTRAVENOUS | Status: DC

## 2020-10-09 MED ORDER — HEPARIN BOLUS VIA INFUSION: 4000.0000 [IU] | Freq: Once | INTRAVENOUS | Status: DC

## 2020-10-09 MED ORDER — ONDANSETRON HCL 4 MG/2ML IJ SOLN: 4.0000 mg | Freq: Four times a day (QID) | INTRAMUSCULAR | Status: DC | PRN

## 2020-10-09 MED ORDER — TAMSULOSIN HCL 0.4 MG PO CAPS
0.4000 mg | ORAL_CAPSULE | Freq: Every day | ORAL | Status: DC
  Administered 2020-10-10 – 2020-10-14 (×5): 0.4 mg via ORAL
  Filled 2020-10-09 (×6): qty 1

## 2020-10-09 MED ORDER — METOPROLOL SUCCINATE ER 25 MG PO TB24
25.0000 mg | ORAL_TABLET | Freq: Every day | ORAL | Status: DC
  Administered 2020-10-11 – 2020-10-14 (×4): 25 mg via ORAL
  Filled 2020-10-09 (×6): qty 1

## 2020-10-09 MED ORDER — ONDANSETRON HCL 4 MG PO TABS: 4.0000 mg | ORAL_TABLET | Freq: Four times a day (QID) | ORAL | Status: DC | PRN

## 2020-10-09 NOTE — Consult Note (Signed)
ANTICOAGULATION CONSULT NOTE - Initial Consult  Pharmacy Consult for heparin Indication:  limb ischemia  No Known Allergies  Patient Measurements: Height: 5\' 10"  (177.8 cm) Weight: 76.7 kg (169 lb) IBW/kg (Calculated) : 73 Heparin Dosing Weight: 76.7 kg   Vital Signs: Temp: 98.3 F (36.8 C) (06/16 2123) Temp Source: Oral (06/16 2123) BP: 117/78 (06/16 2100) Pulse Rate: 84 (06/16 2115)  Labs: Recent Labs    10/09/20 1750  HGB 8.7*  HCT 29.3*  PLT 582*  CREATININE 0.82     Estimated Creatinine Clearance: 90.3 mL/min (by C-G formula based on SCr of 0.82 mg/dL).   Medical History: Past Medical History:  Diagnosis Date   Diabetes mellitus without complication (HCC)    Hypertension    Peripheral vascular disease (HCC)     Medications:  Xarelto on PTA med list -- unknown last dose on group home MAR going back to 10/02/20  Assessment: 65 yo M presenting from wound care for treatment of gangrene in R foot. XR concerning for osteomyelitis. PMH includes HTN, diabetes, and peripheral vascular disease. Pharmacy consulted to dose and monitor heparin.   WBC 8.9, afebrile  Baseline aPTT, HL ordered, PT/INR  Goal of Therapy:  Heparin level 0.3-0.7 units/ml Monitor platelets by anticoagulation protocol: Yes   Plan:  Give 5000 units bolus x 1 Start heparin infusion at 1200 units/hr Check anti-Xa level in 6 hours and daily while on heparin Continue to monitor H&H and platelets  77, PharmD, BCPS Clinical Pharmacist   10/09/2020 9:36 PM

## 2020-10-09 NOTE — ED Notes (Signed)
EDP assessing popliteal and pedal pulses with doppler.

## 2020-10-09 NOTE — ED Notes (Addendum)
EDP at bedside examining pt. Pt R foot is wrapped in gause. Pt was sent to ED from wound care center for eval.  Pt has pronounced peripheral vascular disease in both lower legs.  R foot has several areas that appear blackened. R posterior heel, medial/dorsal aspect of R foot, lateral aspect of R foot, and ends of all toes of R foot.  Also multiple red, shallow wounds noted on RLE (calf).

## 2020-10-09 NOTE — ED Triage Notes (Signed)
Pt to ER via [facility] from [facility].   Pt was seen by his wound care doctor today and was advised to come to the emergency department for further treatment of the gangrene in his right foot. Denies fevers.   EMS VSS.

## 2020-10-09 NOTE — ED Provider Notes (Signed)
[facility] Emergency Department Provider Note  ____________________________________________  Time seen: Approximately 6:31 PM  I have reviewed the triage vital signs and the nursing notes.   HISTORY  Chief Complaint Wound Infection    HPI Brad Frost is a 65 y.o. male with a history of diabetes, peripheral vascular disease who was sent to the ED from wound care today due to gangrene of the right foot.  Patient denies fevers but does have pain in the right midfoot which is worse with movement and pressure.  Denies chest pain shortness of breath or palpitations, denies a history of atrial fibrillation.  Does not take any kind of blood thinners.  Patient reports that he last saw wound care about 2 or 3 weeks ago, but all of the new wounds on his foot have popped up in the last 2 days.                 Past Medical History:  Diagnosis Date  . Diabetes mellitus without complication (HCC)   . Hypertension   . Peripheral vascular disease [facility](HCC)      Patient Active Problem List   Diagnosis Date Noted  . Toe ulcer, right (HCC) 09/17/2020  . Cellulitis of lower extremity 09/16/2020  . COVID-19 04/13/2019  . Hypotension   . COVID-19 virus infection 04/04/2019  . Pneumonia due to COVID-19 virus   . Bilateral pulmonary embolism (HCC)   . Lung mass   . AKI (acute kidney injury) (HCC)   . Type 2 diabetes mellitus with diabetic neuropathy, without long-term current use of insulin (HCC)   . PVD (peripheral vascular disease) (HCC) 02/14/2015     Past Surgical History:  Procedure Laterality Date  . LEG SURGERY  August 20, 2014   Right Leg  BPG  . LOWER EXTREMITY ANGIOGRAPHY Left 09/19/2020   Procedure: Lower Extremity Angiography;  Surgeon: Renford DillsSchnier, Gregory G, MD;  Location: [facility] INVASIVE CV LAB;  Service: Cardiovascular;  Laterality: Left;     Prior to Admission medications   Medication Sig Start Date End Date Taking? Authorizing Provider   acetaminophen (TYLENOL) 325 MG tablet Take 2 tablets (650 mg total) by mouth every 4 (four) hours as needed for headache or mild pain. 09/23/20   Lurene ShadowAyiku, Bernard, MD  aspirin 81 MG tablet Take 81 mg by mouth daily. Patient not taking: No sig reported    [provider]  metoprolol succinate (TOPROL-XL) 25 MG 24 hr tablet Take 1 tablet (25 mg total) by mouth daily. Patient not taking: No sig reported 04/09/19   Arnetha CourserAmin, Sumayya, MD  rivaroxaban (XARELTO) 20 MG TABS tablet Take 1 tablet (20 mg total) by mouth daily with supper. Starting from January 1 after finishing twice daily dose. Patient not taking: No sig reported 04/26/19   Arnetha CourserAmin, Sumayya, MD  tamsulosin (FLOMAX) 0.4 MG CAPS capsule Take 0.4 mg by mouth daily. Patient not taking: No sig reported    [provider]     Allergies Patient has no known allergies.   Family History  Problem Relation Age of Onset  . Hypertension Mother     Social History Social History   Tobacco Use  . Smoking status: Former    Pack years: 0.00    Types: Cigarettes  . Smokeless tobacco: Never  Substance Use Topics  . Alcohol use: Yes  . Drug use: No    Review of Systems  Constitutional:   No fever or chills.  ENT:   No sore throat. No  rhinorrhea. Cardiovascular:   No chest pain or syncope. Respiratory:   No dyspnea or cough. Gastrointestinal:   Negative for abdominal pain, vomiting and diarrhea.  Musculoskeletal:   Right foot pain and swelling All other systems reviewed and are negative except as documented above in ROS and HPI.  ____________________________________________   PHYSICAL EXAM:  VITAL SIGNS: ED Triage Vitals  Enc Vitals Group     BP 10/09/20 1751 109/71     Pulse Rate 10/09/20 1751 90     Resp 10/09/20 1750 18     Temp 10/09/20 1749 99 F (37.2 C)     Temp Source 10/09/20 1749 Oral     SpO2 10/09/20 1750 95 %     Weight 10/09/20 1748 169 lb (76.7 kg)     Height 10/09/20 1748 5\' 10"  (1.778 m)      Head Circumference --      Peak Flow --      Pain Score 10/09/20 1748 7     Pain Loc --      Pain Edu? --      Excl. in GC? --     Vital signs reviewed, nursing assessments reviewed.   Constitutional:   Alert and oriented. Non-toxic appearance. Eyes:   Conjunctivae are normal. EOMI. PERRL. ENT      Head:   Normocephalic and atraumatic.      Nose:   Wearing a mask.      Mouth/Throat:   Wearing a mask.      Neck:   No meningismus. Full ROM. Hematological/Lymphatic/Immunilogical:   No cervical lymphadenopathy. Cardiovascular:   RRR.   No murmurs. Right foot is warm with intact capillary refill.   Right femoral pulses palpable and strong Right popliteal pulse is not palpable, but strong signal on Doppler Right DP pulse is not palpable, weak signal on Doppler  Left lower extremity unremarkable  Respiratory:   Normal respiratory effort without tachypnea/retractions. Breath sounds are clear and equal bilaterally. No wheezes/rales/rhonchi. Gastrointestinal:   Soft and nontender. Non distended. There is no CVA tenderness.  No rebound, rigidity, or guarding. Genitourinary:   deferred Musculoskeletal:   Normal range of motion in all extremities.  Chronic venous stasis dermatitis bilateral lower extremities.  Multiple necrotic wounds at the tip of all 5 right toes, interdigital webspaces, medial dorsal right foot, right distal lateral foot, posterior heel.  Toe spaces are macerated with some purulent drainage, other necrotic wounds are dry.  See images above. There is additional soft tissue ulceration on the posterior lower leg with underlying pink tissue.  Neurologic:   Normal speech and language.  Motor grossly intact. No acute focal neurologic deficits are appreciated.  Skin:    Skin is warm, dry with multiple wounds as above.  Erythema and tenderness of the right foot as well..  ____________________________________________    LABS (pertinent positives/negatives) (all labs ordered  are listed, but only abnormal results are displayed) Labs Reviewed  COMPREHENSIVE METABOLIC PANEL - Abnormal; Notable for the following components:      Result Value   Glucose, Bld 103 (*)    BUN 6 (*)    Calcium 8.3 (*)    Albumin 2.4 (*)    All other components within normal limits  CBC WITH DIFFERENTIAL/PLATELET - Abnormal; Notable for the following components:   RBC 3.63 (*)    Hemoglobin 8.7 (*)    HCT 29.3 (*)    MCH 24.0 (*)    MCHC 29.7 (*)    RDW 22.1 (*)  Platelets 582 (*)    Eosinophils Absolute 0.9 (*)    All other components within normal limits  CULTURE, BLOOD (ROUTINE X 2)  CULTURE, BLOOD (ROUTINE X 2)  RESP PANEL BY RT-PCR (FLU A&B, COVID) ARPGX2  LACTIC ACID, PLASMA  LACTIC ACID, PLASMA  URINALYSIS, COMPLETE (UACMP) WITH MICROSCOPIC  CBG MONITORING, ED   ____________________________________________   EKG    ____________________________________________    RADIOLOGY  DG Tibia/Fibula Right  Result Date: 10/09/2020 CLINICAL DATA:  65 year old male with right foot gangrene. EXAM: RIGHT FOOT - 2 VIEW; RIGHT TIBIA AND FIBULA - 2 VIEW COMPARISON:  Right foot radiograph dated 09/16/2020. FINDINGS: There is no acute fracture or dislocation. Evaluation however is very limited due to advanced osteopenia. There is degenerative changes of the first MTP joint. No periosteal elevation or bone erosion to suggest acute osteomyelitis. Evaluation however is very limited. MRI may provide better evaluation if there is high clinical concern for acute osteomyelitis. There is degenerative changes of the ankle. There is diffuse subcutaneous edema as well as vascular calcification. No radiopaque foreign object or soft tissue gas. IMPRESSION: 1. No acute fracture or dislocation. 2. Osteopenia. 3. Diffuse subcutaneous edema. Electronically Signed   By: Elgie Collard M.D.   On: 10/09/2020 19:09   DG Chest Portable 1 View  Result Date: 10/09/2020 CLINICAL DATA:  65 year old male  with right foot pain. EXAM: PORTABLE CHEST 1 VIEW COMPARISON:  Chest radiograph dated 09/16/2020. FINDINGS: No focal consolidation, pleural effusion, or pneumothorax. Minimal left lung base atelectasis. The cardiac silhouette is within limits. No acute osseous pathology. IMPRESSION: No active disease. Electronically Signed   By: Elgie Collard M.D.   On: 10/09/2020 19:06   DG Foot 2 Views Right  Result Date: 10/09/2020 CLINICAL DATA:  64 year old male with right foot gangrene. EXAM: RIGHT FOOT - 2 VIEW; RIGHT TIBIA AND FIBULA - 2 VIEW COMPARISON:  Right foot radiograph dated 09/16/2020. FINDINGS: There is no acute fracture or dislocation. Evaluation however is very limited due to advanced osteopenia. There is degenerative changes of the first MTP joint. No periosteal elevation or bone erosion to suggest acute osteomyelitis. Evaluation however is very limited. MRI may provide better evaluation if there is high clinical concern for acute osteomyelitis. There is degenerative changes of the ankle. There is diffuse subcutaneous edema as well as vascular calcification. No radiopaque foreign object or soft tissue gas. IMPRESSION: 1. No acute fracture or dislocation. 2. Osteopenia. 3. Diffuse subcutaneous edema. Electronically Signed   By: Elgie Collard M.D.   On: 10/09/2020 19:09    ____________________________________________   PROCEDURES .Critical Care  Date/Time: 10/09/2020 7:09 PM Performed by: Sharman Cheek, MD Authorized by: Sharman Cheek, MD   Critical care provider statement:    Critical care time (minutes):  35   Critical care time was exclusive of:  Separately billable procedures and treating other patients   Critical care was necessary to treat or prevent imminent or life-threatening deterioration of the following conditions:  Circulatory failure   Critical care was time spent personally by me on the following activities:  Development of treatment plan with patient or surrogate,  discussions with consultants, evaluation of patient's response to treatment, examination of patient, obtaining history from patient or surrogate, ordering and performing treatments and interventions, ordering and review of laboratory studies, ordering and review of radiographic studies, pulse oximetry, re-evaluation of patient's condition and review of old charts  ____________________________________________  DIFFERENTIAL DIAGNOSIS   Cellulitis, osteomyelitis, vascular insufficiency  CLINICAL IMPRESSION / ASSESSMENT AND  PLAN / ED COURSE  Medications ordered in the ED: Medications  vancomycin (VANCOCIN) IVPB 1000 mg/200 mL premix (has no administration in time range)  piperacillin-tazobactam (ZOSYN) IVPB 3.375 g (has no administration in time range)  sodium chloride 0.9 % bolus 1,000 mL (1,000 mLs Intravenous New Bag/Given 10/09/20 1905)    Pertinent labs & imaging results that were available during my care of the patient were reviewed by me and considered in my medical decision making (see chart for details).  Brad Frost was evaluated in Emergency Department on 10/09/2020 for the symptoms described in the history of present illness. He was evaluated in the context of the global COVID-19 pandemic, which necessitated consideration that the patient might be at risk for infection with the SARS-CoV-2 virus that causes COVID-19. Institutional protocols and algorithms that pertain to the evaluation of patients at risk for COVID-19 are in a state of rapid change based on information released by regulatory bodies including the CDC and federal and state organizations. These policies and algorithms were followed during the patient's care in the ED.     Clinical Course as of 10/09/20 1938  Thu Oct 09, 2020  1836 Patient presents with multiple wounds on the right foot and lower leg.  Has the appearance of dry gangrene due to vascular disease, but there is maceration and purulence of the toe wounds  concerning for infection as well. [PS]  1937 Discussed with vascular surgery Dr. Wyn Quaker  who agrees with heparinization for now, admission for medical stabilization pending vascular evaluation in the morning. [PS]    Clinical Course User Index [PS] Sharman Cheek, MD     ____________________________________________   FINAL CLINICAL IMPRESSION(S) / ED DIAGNOSES    Final diagnoses:  Gangrene of right foot (HCC)  PVD (peripheral vascular disease) [facility])     ED Discharge Orders     None       Portions of this note were generated with dragon dictation software. Dictation errors may occur despite best attempts at proofreading.   Sharman Cheek, MD 10/09/20 [phone]

## 2020-10-09 NOTE — H&P (Signed)
History and Physical    Brad Frost:[id number] DOB: 1955-06-29 DOA: 10/09/2020  PCP: [name], [name], MD   Patient coming from: Home  I have personally briefly reviewed patient's old medical records in [facility]  Chief Complaint: Right Foot Pain  HPI: Brad Frost is a 65 y.o. male with medical history significant for type 2 diabetes mellitus, essential hypertension, peripheral vascular disease, nicotine dependence, chronic lymphedema and chronic alcohol use who was seen by the wound care specialist and referred to the emergency room on for gangrene involving the toes on his right foot and a large eschar over the dorsum of the right foot. He denies having any fever or chills, no chest pain, no shortness of breath, no dizziness, no lightheadedness, no abdominal pain, no nausea, no vomiting, no palpitations, no diaphoresis, no cough, no urinary symptoms, no changes in his bowel habits. Labs show sodium 135, potassium 4.0, chloride 104, bicarb 23, glucose 103, BUN 6, creatinine 0.82, calcium 8.3, alkaline phosphatase 73, albumin 2.4, AST 15, ALT 11, total protein 7.4, lactic acid 1.8, white count 8.9, hemoglobin 8.7, hematocrit 29.3, MCV 80.7, RDW 22.1, platelet count 582 Chest x-ray reviewed by me shows no acute cardiopulmonary disease X-ray of the right tibia-fibula/right foot x-ray shows no acute fracture or dislocation.Osteopenia. Diffuse subcutaneous edema.    ED Course: Patient is a 65 year old male this history of peripheral vascular disease, nicotine dependence, medication noncompliance who was sent to the ER from wound care clinic for evaluation of multiple lower extremity wounds which include gangrene of the toes on the right foot, wound involving the R posterior heel, medial/dorsal aspect of R foot and lateral aspect of R foot Patient also has multiple red, shallow wounds noted on RLE (calf). ER physician discussed with vascular surgery and patient was started on a  heparin drip. He also received IV antibiotics for concerns of superimposed cellulitis. Patient will be admitted to the hospital for further evaluation.   Review of Systems: As per HPI otherwise all other systems reviewed and negative.    Past Medical History:  Diagnosis Date   Diabetes mellitus without complication (HCC)    Hypertension    Peripheral vascular disease [facility])     Past Surgical History:  Procedure Laterality Date   LEG SURGERY  August 20, 2014   Right Leg  BPG   LOWER EXTREMITY ANGIOGRAPHY Left 09/19/2020   Procedure: Lower Extremity Angiography;  Surgeon: Renford Dills, MD;  Location: [facility] INVASIVE CV LAB;  Service: Cardiovascular;  Laterality: Left;     reports that he has quit smoking. His smoking use included cigarettes. He has never used smokeless tobacco. He reports current alcohol use. He reports that he does not use drugs.  No Known Allergies  Family History  Problem Relation Age of Onset   Hypertension Mother       Prior to Admission medications   Medication Sig Start Date End Date Taking? Authorizing Provider  acetaminophen (TYLENOL) 325 MG tablet Take 2 tablets (650 mg total) by mouth every 4 (four) hours as needed for headache or mild pain. 09/23/20   Lurene Shadow, MD  aspirin 81 MG tablet Take 81 mg by mouth daily. Patient not taking: No sig reported    [provider]  metoprolol succinate (TOPROL-XL) 25 MG 24 hr tablet Take 1 tablet (25 mg total) by mouth daily. Patient not taking: No sig reported 04/09/19   Arnetha Courser, MD  rivaroxaban (XARELTO) 20 MG TABS tablet Take 1 tablet (20  mg total) by mouth daily with supper. Starting from January 1 after finishing twice daily dose. Patient not taking: No sig reported 04/26/19   Arnetha Courser, MD  tamsulosin (FLOMAX) 0.4 MG CAPS capsule Take 0.4 mg by mouth daily. Patient not taking: No sig reported    [provider]    Physical Exam: Vitals:   10/09/20 1945 10/09/20 2000  10/09/20 2015 10/09/20 2030  BP:  105/79  120/75  Pulse: 84 79 94 83  Resp:      Temp:      TempSrc:      SpO2: 100% 100% 100% 100%  Weight:      Height:         Vitals:   10/09/20 1945 10/09/20 2000 10/09/20 2015 10/09/20 2030  BP:  105/79  120/75  Pulse: 84 79 94 83  Resp:      Temp:      TempSrc:      SpO2: 100% 100% 100% 100%  Weight:      Height:          Constitutional: Alert and oriented x 3 . Not in any apparent distress HEENT:      Head: Normocephalic and atraumatic.         Eyes: PERLA, EOMI, Conjunctivae are normal. Sclera is non-icteric.       Mouth/Throat: Mucous membranes are moist.       Neck: Supple with no signs of meningismus. Cardiovascular: Regular rate and rhythm. No murmurs, gallops, or rubs. 2+ symmetrical distal pulses are present . No JVD. No LE edema Respiratory: Respiratory effort normal .Lungs sounds clear bilaterally. No wheezes, crackles, or rhonchi.  Gastrointestinal: Soft, non tender, and non distended with positive bowel sounds.  Genitourinary: No CVA tenderness. Musculoskeletal: Chronic venous stasis dermatitis bilateral lower extremities.  Multiple necrotic wounds at the tip of all 5 right toes, interdigital webspaces, medial dorsal right foot, right distal lateral foot, posterior heel.  Toe spaces are macerated with some purulent drainage, other necrotic wounds are dry.  See images above. There is additional soft tissue ulceration on the posterior lower leg with underlying pink tissue. Neurologic:  Face is symmetric. Moving all extremities. No gross focal neurologic deficits . Skin: Skin is warm, dry.   Psychiatric: Mood and affect are normal    Labs on Admission: I have personally reviewed following labs and imaging studies  CBC: Recent Labs  Lab 10/09/20 1750  WBC 8.9  NEUTROABS 5.5  HGB 8.7*  HCT 29.3*  MCV 80.7  PLT 582*   Basic Metabolic Panel: Recent Labs  Lab 10/09/20 1750  NA 135  K 4.0  CL 104  CO2 23   GLUCOSE 103*  BUN 6*  CREATININE 0.82  CALCIUM 8.3*   GFR: Estimated Creatinine Clearance: 90.3 mL/min (by C-G formula based on SCr of 0.82 mg/dL). Liver Function Tests: Recent Labs  Lab 10/09/20 1750  AST 15  ALT 11  ALKPHOS 73  BILITOT 0.5  PROT 7.4  ALBUMIN 2.4*   No results for input(s): LIPASE, AMYLASE in the last 168 hours. No results for input(s): AMMONIA in the last 168 hours. Coagulation Profile: No results for input(s): INR, PROTIME in the last 168 hours. Cardiac Enzymes: No results for input(s): CKTOTAL, CKMB, CKMBINDEX, TROPONINI in the last 168 hours. BNP (last 3 results) No results for input(s): PROBNP in the last 8760 hours. HbA1C: No results for input(s): HGBA1C in the last 72 hours. CBG: Recent Labs  Lab 10/09/20 1752  GLUCAP 84  Lipid Profile: No results for input(s): CHOL, HDL, LDLCALC, TRIG, CHOLHDL, LDLDIRECT in the last 72 hours. Thyroid Function Tests: No results for input(s): TSH, T4TOTAL, FREET4, T3FREE, THYROIDAB in the last 72 hours. Anemia Panel: No results for input(s): VITAMINB12, FOLATE, FERRITIN, TIBC, IRON, RETICCTPCT in the last 72 hours. Urine analysis: No results found for: COLORURINE, APPEARANCEUR, LABSPEC, PHURINE, GLUCOSEU, HGBUR, BILIRUBINUR, KETONESUR, PROTEINUR, UROBILINOGEN, NITRITE, LEUKOCYTESUR  Radiological Exams on Admission: DG Tibia/Fibula Right  Result Date: 10/09/2020 CLINICAL DATA:  65 year old male with right foot gangrene. EXAM: RIGHT FOOT - 2 VIEW; RIGHT TIBIA AND FIBULA - 2 VIEW COMPARISON:  Right foot radiograph dated 09/16/2020. FINDINGS: There is no acute fracture or dislocation. Evaluation however is very limited due to advanced osteopenia. There is degenerative changes of the first MTP joint. No periosteal elevation or bone erosion to suggest acute osteomyelitis. Evaluation however is very limited. MRI may provide better evaluation if there is high clinical concern for acute osteomyelitis. There is  degenerative changes of the ankle. There is diffuse subcutaneous edema as well as vascular calcification. No radiopaque foreign object or soft tissue gas. IMPRESSION: 1. No acute fracture or dislocation. 2. Osteopenia. 3. Diffuse subcutaneous edema. Electronically Signed   By: Elgie CollardArash  Radparvar M.D.   On: 10/09/2020 19:09   DG Chest Portable 1 View  Result Date: 10/09/2020 CLINICAL DATA:  65 year old male with right foot pain. EXAM: PORTABLE CHEST 1 VIEW COMPARISON:  Chest radiograph dated 09/16/2020. FINDINGS: No focal consolidation, pleural effusion, or pneumothorax. Minimal left lung base atelectasis. The cardiac silhouette is within limits. No acute osseous pathology. IMPRESSION: No active disease. Electronically Signed   By: Elgie CollardArash  Radparvar M.D.   On: 10/09/2020 19:06   DG Foot 2 Views Right  Result Date: 10/09/2020 CLINICAL DATA:  65 year old male with right foot gangrene. EXAM: RIGHT FOOT - 2 VIEW; RIGHT TIBIA AND FIBULA - 2 VIEW COMPARISON:  Right foot radiograph dated 09/16/2020. FINDINGS: There is no acute fracture or dislocation. Evaluation however is very limited due to advanced osteopenia. There is degenerative changes of the first MTP joint. No periosteal elevation or bone erosion to suggest acute osteomyelitis. Evaluation however is very limited. MRI may provide better evaluation if there is high clinical concern for acute osteomyelitis. There is degenerative changes of the ankle. There is diffuse subcutaneous edema as well as vascular calcification. No radiopaque foreign object or soft tissue gas. IMPRESSION: 1. No acute fracture or dislocation. 2. Osteopenia. 3. Diffuse subcutaneous edema. Electronically Signed   By: Elgie CollardArash  Radparvar M.D.   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 He is afebrile and has no leukocytosis Will place patient empirically on Ancef Consult vascular surgery and podiatry    Peripheral vascular disease Continue aspirin, high intensity statins and beta-blockers    Anemia of chronic disease H&H is stable   Nicotine dependence Patient states that he has cut back on the  number of cigarettes that he smokes since he went to the rest home He declines a nicotine transdermal patch at this time    DVT prophylaxis: Heparin Code Status: full code  Family Communication: Greater than 50% of time was spent discussing patient's condition and plan of care with him at the bedside.  All questions and concerns have been addressed.  He verbalizes understanding and agrees with the plan. Disposition Plan: Back to previous home environment Consults called: Podiatry, vascular surgery Status: At the time of admission, it appears that the appropriate admission status for this patient is inpatient. This is judged to be stable and necessary to provide the required intensity of service to ensure the patient's safety given the presenting symptoms, physical exam findings, and initial radiographic and laboratory data in the context of their comorbid conditions. Patient requires inpatient status due to high intensity of service, high risk of further deterioration and high frequency of surveillance required.    Lucile Shutters MD [facility]     10/09/2020, 9:23 PM

## 2020-10-09 NOTE — ED Notes (Signed)
Pt refusing second iv or multiple sticks for blood cultures. He asked to wait until his nerves calmed down.

## 2020-10-09 NOTE — Consult Note (Signed)
PHARMACY -  BRIEF ANTIBIOTIC NOTE   Pharmacy has received consult(s) for vancomycin from an ED provider.  The patient's profile has been reviewed for ht/wt/allergies/indication/available labs.    One time order(s) placed for vancomycin 1 g IV x 1 dose per EDP  Further antibiotics/pharmacy consults should be ordered by admitting physician if indicated.                       Thank you,  Reatha Armour, PharmD Pharmacy Resident  10/09/2020 6:42 PM

## 2020-10-09 NOTE — ED Notes (Signed)
Bloodwork not obtained yet. Blood flow in [place] noted to be very sluggish.

## 2020-10-10 ENCOUNTER — Encounter
Admission: EM | Disposition: A | Discharge status: Skilled Nursing Facility | Payer: Medicare Other | Source: Skilled Nursing Facility | Attending: Internal Medicine

## 2020-10-10 ENCOUNTER — Encounter: Payer: Self-pay | Admitting: Internal Medicine

## 2020-10-10 DIAGNOSIS — I96 Gangrene, not elsewhere classified: Secondary | ICD-10-CM

## 2020-10-10 DIAGNOSIS — I70261 Atherosclerosis of native arteries of extremities with gangrene, right leg: Secondary | ICD-10-CM

## 2020-10-10 DIAGNOSIS — I739 Peripheral vascular disease, unspecified: Secondary | ICD-10-CM

## 2020-10-10 DIAGNOSIS — I7092 Chronic total occlusion of artery of the extremities: Secondary | ICD-10-CM

## 2020-10-10 HISTORY — PX: LOWER EXTREMITY ANGIOGRAPHY: CATH118251

## 2020-10-10 LAB — BASIC METABOLIC PANEL
Anion gap: 6 (ref 5–15)
BUN: 6 mg/dL — ABNORMAL LOW (ref 8–23)
CO2: 25 mmol/L (ref 22–32)
Calcium: 8.1 mg/dL — ABNORMAL LOW (ref 8.9–10.3)
Chloride: 109 mmol/L (ref 98–111)
Creatinine, Ser: 0.75 mg/dL (ref 0.61–1.24)
GFR, Estimated: >60 mL/min (ref >60)
Glucose, Bld: 86 mg/dL (ref 70–99)
Potassium: 3.6 mmol/L (ref 3.5–5.1)
Sodium: 140 mmol/L (ref 135–145)

## 2020-10-10 LAB — CBC
HCT: 26.1 % — ABNORMAL LOW (ref 39.0–52.0)
Hemoglobin: 7.8 g/dL — ABNORMAL LOW (ref 13.0–17.0)
MCH: 23.9 pg — ABNORMAL LOW (ref 26.0–34.0)
MCHC: 29.9 g/dL — ABNORMAL LOW (ref 30.0–36.0)
MCV: 79.8 fL — ABNORMAL LOW (ref 80.0–100.0)
Platelets: 437 10*3/uL — ABNORMAL HIGH (ref 150–400)
RBC: 3.27 MIL/uL — ABNORMAL LOW (ref 4.22–5.81)
RDW: 22.1 % — ABNORMAL HIGH (ref 11.5–15.5)
WBC: 9 10*3/uL (ref 4.0–10.5)
nRBC: 0 % (ref 0.0–0.2)

## 2020-10-10 LAB — APTT: aPTT: 77 seconds — ABNORMAL HIGH (ref 24–36)

## 2020-10-10 LAB — HEPARIN LEVEL (UNFRACTIONATED): Heparin Unfractionated: 0.42 IU/mL (ref 0.30–0.70)

## 2020-10-10 LAB — PROTIME-INR
INR: 1.2 (ref 0.8–1.2)
Prothrombin Time: 15.1 seconds (ref 11.4–15.2)

## 2020-10-10 LAB — LACTIC ACID, PLASMA: Lactic Acid, Venous: 1.1 mmol/L (ref 0.5–1.9)

## 2020-10-10 SURGERY — LOWER EXTREMITY ANGIOGRAPHY
Anesthesia: Moderate Sedation | Laterality: Right

## 2020-10-10 MED ORDER — CEFAZOLIN SODIUM-DEXTROSE 2-4 GM/100ML-% IV SOLN
INTRAVENOUS | Status: AC
  Filled 2020-10-10: qty 100

## 2020-10-10 MED ORDER — CEFAZOLIN SODIUM-DEXTROSE 2-4 GM/100ML-% IV SOLN: 2.0000 g | INTRAVENOUS | Status: DC

## 2020-10-10 MED ORDER — IODIXANOL 320 MG/ML IV SOLN
INTRAVENOUS | Status: DC | PRN
  Administered 2020-10-10: 20 mL via INTRA_ARTERIAL

## 2020-10-10 MED ORDER — FENTANYL CITRATE (PF) 100 MCG/2ML IJ SOLN
INTRAMUSCULAR | Status: AC
  Filled 2020-10-10: qty 2

## 2020-10-10 MED ORDER — MIDAZOLAM HCL 5 MG/5ML IJ SOLN
INTRAMUSCULAR | Status: AC
  Filled 2020-10-10: qty 5

## 2020-10-10 MED ORDER — ATORVASTATIN CALCIUM 20 MG PO TABS
80.0000 mg | ORAL_TABLET | Freq: Every day | ORAL | Status: DC
  Administered 2020-10-10 – 2020-10-14 (×5): 80 mg via ORAL
  Filled 2020-10-10 (×5): qty 4

## 2020-10-10 MED ORDER — MORPHINE SULFATE (PF) 2 MG/ML IV SOLN
2.0000 mg | INTRAVENOUS | Status: DC | PRN
  Administered 2020-10-11: 2 mg via INTRAVENOUS
  Filled 2020-10-10 (×3): qty 1

## 2020-10-10 MED ORDER — SODIUM CHLORIDE 0.9 % IV SOLN: INTRAVENOUS | Status: DC

## 2020-10-10 MED ORDER — HEPARIN SODIUM (PORCINE) 1000 UNIT/ML IJ SOLN
INTRAMUSCULAR | Status: AC
  Filled 2020-10-10: qty 1

## 2020-10-10 MED ORDER — FENTANYL CITRATE (PF) 100 MCG/2ML IJ SOLN
INTRAMUSCULAR | Status: DC | PRN
  Administered 2020-10-10: 50 ug via INTRAVENOUS

## 2020-10-10 MED ORDER — MIDAZOLAM HCL 2 MG/2ML IJ SOLN
INTRAMUSCULAR | Status: DC | PRN
  Administered 2020-10-10: 2 mg via INTRAVENOUS
  Administered 2020-10-10: 1 mg via INTRAVENOUS

## 2020-10-10 SURGICAL SUPPLY — 9 items
CATH ANGIO 5F PIGTAIL 65CM (CATHETERS) ×1 IMPLANT
COVER PROBE U/S 5X48 (MISCELLANEOUS) ×1 IMPLANT
DEVICE STARCLOSE SE CLOSURE (Vascular Products) ×1 IMPLANT
GLIDEWIRE ADV .035X260CM (WIRE) ×1 IMPLANT
PACK ANGIOGRAPHY (CUSTOM PROCEDURE TRAY) ×2 IMPLANT
SHEATH BRITE TIP 5FRX11 (SHEATH) ×1 IMPLANT
SYR MEDRAD MARK 7 150ML (SYRINGE) ×1 IMPLANT
TUBING CONTRAST HIGH PRESS 72 (TUBING) ×1 IMPLANT
WIRE GUIDERIGHT .035X150 (WIRE) ×2 IMPLANT

## 2020-10-10 NOTE — Consult Note (Signed)
  Subjective:  Patient ID: Brad Frost, male    DOB: 1955-09-19,  MRN: [id number]  A 65 y.o. male presents with significant for type 2 diabetes mellitus, essential hypertension, peripheral vascular disease, nicotine dependence, chronic lymphedema and chronic alcohol use who was seen at the wound care center and sent over to the emergency room.  Was consulted for gangrenous changes to the right second digit as well as the dorsal foot and posterior heel.  He states they have been present for quite some time.  He states that he does not have any vascular specialist that he follows up with.  He denies anything or doing anything for the wound.  He denies any other acute complaints.  Objective:   Vitals:   10/09/20 2141 10/10/20 0359  BP: 107/66 (!) 91/54  Pulse: 88 86  Resp: 17 16  Temp: 97.8 F (36.6 C) 98 F (36.7 C)  SpO2: 100% 93%   General AA&O x3. Normal mood and affect.  Vascular Dorsalis pedis and posterior tibial pulses 2/4 bilat. Brisk capillary refill to all digits. Pedal hair present.  Neurologic Epicritic sensation grossly intact.  Dermatologic Right second digit and dorsal foot and posterior heel eschar noted to be dry and very stable.  No clinical signs of infection.  No malodor present.  No redness or cellulitis noted.  Orthopedic: MMT 5/5 in dorsiflexion, plantarflexion, inversion, and eversion. Normal joint ROM without pain or crepitus.       Assessment & Plan:  Patient was evaluated and treated and all questions answered.  Right second digit, third digit dorsal medial foot and posterior heel dry stable eschar/gangrene -All questions and concerns were addressed with the patient directly and answered. -Clinically at this time there is no clinical signs of infection this is primarily vascular in nature. -He will benefit from a entirely fully vascular work-up.  Awaiting vascular surgery consult. -From our standpoint with no OR indicated at this time.  We will  continue to monitor it clinically especially after the vascular flow has been restored. -If he is not a candidate for vascular intervention then we will just continue with local wound care and if he gets clinically infected then may need a major amputation.  Patient is a high risk of a major amputation. -We will continue to follow from afar once vascular flow has been restored.  We can likely follow-up with him in an outpatient setting 1 week from discharge. -Local wound care with Betadine wet-to-dry dressing -Weightbearing as tolerated with a surgical shoe bilaterally  Candelaria Stagers, DPM  Accessible via secure chat for questions or concerns.

## 2020-10-10 NOTE — Progress Notes (Signed)
Dr. Georgeann Oppenheim aware of the patient being confused after angiogram & wanting to speak with someone for a 2nd opinion regarding amputation.

## 2020-10-10 NOTE — H&P (View-Only) (Signed)
[facility] Vascular Consult Note  MRN : [id number]  Brad Frost is a 65 y.o. (01/12/1956) male who presents with chief complaint of  Chief Complaint  Patient presents with   Wound Infection  .  History of Present Illness: Presents to the hospital with gangrene of the right foot and we are consulted by Dr. Scotty CourtStafford [facility] department for vascular evaluation.  He does not see a vascular surgeon.  He has longstanding ulcerations to the right foot which are gradually worsening.  He has dark eschar on the top of the foot, on the heel, and a nonhealing wound on the posterior calf as well as toe ulcerations.  He has thick scaling skin and significant swelling in both lower extremities.  The legs are chronically sore and swollen.  The has alcohol and tobacco dependence as well as multiple other issues as listed below.  He is a poor historian.  He presented to the emergency department because his pain has just gotten to the point that he can no longer bear it.  Current Facility-Administered Medications  Medication Dose Route Frequency Provider Last Rate Last Admin   0.9 %  sodium chloride infusion   Intravenous Continuous Agbata, Tochukwu, MD 100 mL/hr at 10/10/20 0827 New Bag at 10/10/20 0827   0.9 %  sodium chloride infusion   Intravenous Continuous Ava Deguire, Marlow BaarsJason S, MD       acetaminophen (TYLENOL) tablet 650 mg  650 mg Oral Q4H PRN Agbata, Tochukwu, MD       aspirin EC tablet 81 mg  81 mg Oral Daily Agbata, Tochukwu, MD   81 mg at 10/09/20 2345   atorvastatin (LIPITOR) tablet 80 mg  80 mg Oral Daily Lolita PatellaSreenath, Sudheer B, MD   80 mg at 10/10/20 1230   ceFAZolin (ANCEF) IVPB 1 g/50 mL premix  1 g Intravenous Q8H Agbata, Tochukwu, MD       ceFAZolin (ANCEF) IVPB 2g/100 mL premix  2 g Intravenous 30 min Pre-Op Annice Needyew, Daymion Nazaire S, MD       heparin ADULT infusion 100 units/mL (25000 units/23350mL)  1,200 Units/hr Intravenous Continuous Sharen HonesDolan, Carissa E, RPH 12 mL/hr at  10/10/20 1230 1,200 Units/hr at 10/10/20 1230   metoprolol succinate (TOPROL-XL) 24 hr tablet 25 mg  25 mg Oral Daily Agbata, Tochukwu, MD       morphine 2 MG/ML injection 2 mg  2 mg Intravenous Q4H PRN Agbata, Tochukwu, MD       ondansetron (ZOFRAN) tablet 4 mg  4 mg Oral Q6H PRN Agbata, Tochukwu, MD       Or   ondansetron (ZOFRAN) injection 4 mg  4 mg Intravenous Q6H PRN Agbata, Tochukwu, MD       tamsulosin (FLOMAX) capsule 0.4 mg  0.4 mg Oral Daily Agbata, Tochukwu, MD   0.4 mg at 10/10/20 0815    Past Medical History:  Diagnosis Date   Diabetes mellitus without complication (HCC)    Hypertension    Peripheral vascular disease (HCC)     Past Surgical History:  Procedure Laterality Date   LEG SURGERY  August 20, 2014   Right Leg  BPG   LOWER EXTREMITY ANGIOGRAPHY Left 09/19/2020   Procedure: Lower Extremity Angiography;  Surgeon: Renford DillsSchnier, Gregory G, MD;  Location: [facility] INVASIVE CV LAB;  Service: Cardiovascular;  Laterality: Left;     Social History   Tobacco Use   Smoking status: Former    Pack years: 0.00    Types: Cigarettes  Smokeless tobacco: Never  Substance Use Topics   Alcohol use: Yes   Drug use: No     Family History  Problem Relation Age of Onset   Hypertension Mother   No bleeding disorders, clotting disorders, autoimmune diseases, or aneurysms  No Known Allergies   REVIEW OF SYSTEMS (Negative unless checked)  Constitutional: [] Weight loss  [] Fever  [] Chills Cardiac: [] Chest pain   [] Chest pressure   [] Palpitations   [] Shortness of breath when laying flat   [] Shortness of breath at rest   [] Shortness of breath with exertion. Vascular:  [] Pain in legs with walking   [] Pain in legs at rest   [] Pain in legs when laying flat   [] Claudication   [x] Pain in feet when walking  [x] Pain in feet at rest  [x] Pain in feet when laying flat   [] History of DVT   [] Phlebitis   [x] Swelling in legs   [] Varicose veins   [x] Non-healing ulcers Pulmonary:   [] Uses home oxygen    [] Productive cough   [] Hemoptysis   [] Wheeze  [] COPD   [] Asthma Neurologic:  [] Dizziness  [] Blackouts   [] Seizures   [] History of stroke   [] History of TIA  [] Aphasia   [] Temporary blindness   [] Dysphagia   [] Weakness or numbness in arms   [] Weakness or numbness in legs Musculoskeletal:  [x] Arthritis   [] Joint swelling   [x] Joint pain   [] Low back pain Hematologic:  [] Easy bruising  [] Easy bleeding   [] Hypercoagulable state   [] Anemic  [] Hepatitis Gastrointestinal:  [] Blood in stool   [] Vomiting blood  [] Gastroesophageal reflux/heartburn   [] Difficulty swallowing. Genitourinary:  [] Chronic kidney disease   [] Difficult urination  [] Frequent urination  [] Burning with urination   [] Blood in urine Skin:  [] Rashes   [x] Ulcers   [x] Wounds Psychological:  [] History of anxiety   []  History of major depression.  Physical Examination  Vitals:   10/09/20 2141 10/10/20 0359 10/10/20 0814 10/10/20 1111  BP: 107/66 (!) 91/54 107/72 107/72  Pulse: 88 86 87 86  Resp: 17 16 18 17   Temp: 97.8 F (36.6 C) 98 F (36.7 C) 98.2 F (36.8 C) 98.4 F (36.9 C)  TempSrc: Oral Oral Oral Oral  SpO2: 100% 93% 97% 100%  Weight:      Height:       Body mass index is 24.25 kg/m. Gen:  WD/WN, NAD Head: [place]/AT, No temporalis wasting.  Ear/Nose/Throat: Hearing grossly intact, nares w/o erythema or drainage, oropharynx w/o Erythema/Exudate Eyes: Sclera non-icteric, conjunctiva clear Neck: Trachea midline.  No JVD.  Pulmonary:  Good air movement, respirations not labored, equal bilaterally.  Cardiac: RRR, normal S1, S2. Vascular:  Vessel Right Left  Radial Palpable Palpable                          PT Not palpable Not palpable  DP Not palpable 1+ palpable   Musculoskeletal: M/S 5/5 throughout.  Right foot is chronically ischemic.  Both legs have 1-2+ swelling with thick scaling skin consistent with chronic lymphedema.  Ulceration and gangrenous changes to the right foot as above Neurologic: Sensation  grossly intact in extremities.  Symmetrical.  Speech is fluent. Motor exam as listed above. Psychiatric: Judgment and insight appear fair.  He is not a great historian. Dermatologic: Multiple open wounds and gangrenous eschar to the right foot.  These include the right heel, the plantar medial aspect of the right foot, several toes, and a quarter sized ulceration on the right posterior calf.  CBC Lab Results  Component Value Date   WBC 9.0 10/10/2020   HGB 7.8 (L) 10/10/2020   HCT 26.1 (L) 10/10/2020   MCV 79.8 (L) 10/10/2020   PLT 437 (H) 10/10/2020    BMET    Component Value Date/Time   NA 140 10/10/2020 0618   K 3.6 10/10/2020 0618   CL 109 10/10/2020 0618   CO2 25 10/10/2020 0618   GLUCOSE 86 10/10/2020 0618   BUN 6 (L) 10/10/2020 0618   CREATININE 0.75 10/10/2020 0618   CALCIUM 8.1 (L) 10/10/2020 0618   GFRNONAA >60 10/10/2020 0618   GFRAA >60 04/14/2019 1345   Estimated Creatinine Clearance: 92.5 mL/min (by C-G formula based on SCr of 0.75 mg/dL).  COAG Lab Results  Component Value Date   INR 1.2 10/10/2020   INR 3.1 (H) 09/18/2020   INR 1.0 09/16/2020    Radiology DG Tibia/Fibula Right  Result Date: 10/09/2020 CLINICAL DATA:  65 year old male with right foot gangrene. EXAM: RIGHT FOOT - 2 VIEW; RIGHT TIBIA AND FIBULA - 2 VIEW COMPARISON:  Right foot radiograph dated 09/16/2020. FINDINGS: There is no acute fracture or dislocation. Evaluation however is very limited due to advanced osteopenia. There is degenerative changes of the first MTP joint. No periosteal elevation or bone erosion to suggest acute osteomyelitis. Evaluation however is very limited. MRI may provide better evaluation if there is high clinical concern for acute osteomyelitis. There is degenerative changes of the ankle. There is diffuse subcutaneous edema as well as vascular calcification. No radiopaque foreign object or soft tissue gas. IMPRESSION: 1. No acute fracture or dislocation. 2.  Osteopenia. 3. Diffuse subcutaneous edema. Electronically Signed   By: Elgie Collard M.D.   On: 10/09/2020 19:09   PERIPHERAL VASCULAR CATHETERIZATION  Result Date: 09/19/2020 See Op Note  DG Chest Portable 1 View  Result Date: 10/09/2020 CLINICAL DATA:  65 year old male with right foot pain. EXAM: PORTABLE CHEST 1 VIEW COMPARISON:  Chest radiograph dated 09/16/2020. FINDINGS: No focal consolidation, pleural effusion, or pneumothorax. Minimal left lung base atelectasis. The cardiac silhouette is within limits. No acute osseous pathology. IMPRESSION: No active disease. Electronically Signed   By: Elgie Collard M.D.   On: 10/09/2020 19:06   DG Chest Port 1 View  Result Date: 09/16/2020 CLINICAL DATA:  Bilateral leg infections. EXAM: PORTABLE CHEST 1 VIEW COMPARISON:  Chest x-ray 04/12/2019. FINDINGS: Mediastinum hilar structures normal. Low lung volumes. Mild left base atelectasis/scarring. Mild left base infiltrate cannot be excluded. Stable elevation left hemidiaphragm. No pleural effusion or pneumothorax. IMPRESSION: Mild left base atelectasis/scarring. Mild left base infiltrate cannot be excluded. Stable elevation left hemidiaphragm. Electronically Signed   By: Maisie Fus  Register   On: 09/16/2020 15:31   DG Foot 2 Views Right  Result Date: 10/09/2020 CLINICAL DATA:  65 year old male with right foot gangrene. EXAM: RIGHT FOOT - 2 VIEW; RIGHT TIBIA AND FIBULA - 2 VIEW COMPARISON:  Right foot radiograph dated 09/16/2020. FINDINGS: There is no acute fracture or dislocation. Evaluation however is very limited due to advanced osteopenia. There is degenerative changes of the first MTP joint. No periosteal elevation or bone erosion to suggest acute osteomyelitis. Evaluation however is very limited. MRI may provide better evaluation if there is high clinical concern for acute osteomyelitis. There is degenerative changes of the ankle. There is diffuse subcutaneous edema as well as vascular calcification.  No radiopaque foreign object or soft tissue gas. IMPRESSION: 1. No acute fracture or dislocation. 2. Osteopenia. 3. Diffuse subcutaneous edema. Electronically Signed  By: Elgie Collard M.D.   On: 10/09/2020 19:09   DG Foot 2 Views Right  Result Date: 09/16/2020 CLINICAL DATA:  ?Osteomyelitis EXAM: RIGHT FOOT - 2 VIEW COMPARISON:  None. FINDINGS: Diffuse osteopenia. There is diffuse soft tissue swelling with possible wound at the lateral hindfoot. There is no evidence of acute fracture. There is mild first MTP degenerative change in additional scattered interphalangeal joint degenerative change. Vascular calcifications. IMPRESSION: No radiographic evidence of osteomyelitis. MRI would be more sensitive. Diffuse soft tissue swelling with possible wound along the lateral hindfoot. Diffuse osteopenia.  Mild first MTP degenerative arthritis. Electronically Signed   By: Caprice Renshaw   On: 09/16/2020 15:32   ECHOCARDIOGRAM COMPLETE  Result Date: 09/17/2020    ECHOCARDIOGRAM REPORT   Patient Name:   Brad Frost [facility] Date of Exam: 09/16/2020 Medical Rec #:  [id number]         Height:       70.0 in Accession #:    [id number]        Weight:       169.0 lb Date of Birth:  Jun 21, 1955          BSA:          1.943 m Patient Age:    64 years          BP:           116/72 mmHg Patient Gender: M                 HR:           126 bpm. Exam Location:  [facility] Procedure: 2D Echo, Cardiac Doppler and Color Doppler Indications:     I50.31 Acute Diastolic CHF  History:         Patient has prior history of Echocardiogram examinations, most                  recent 04/13/2019. Risk Factors:Hypertension and Diabetes.                  Peripheral vascular disease.  Sonographer:     Sedonia Small Rodgers-Jones Referring Phys:  [id number] Marrion Coy Diagnosing Phys: Arnoldo Hooker MD IMPRESSIONS  1. Left ventricular ejection fraction, by estimation, is 65 to 70%. The left ventricle has normal function. The left ventricle has no regional wall motion  abnormalities. Left ventricular diastolic parameters were normal.  2. Right ventricular systolic function is normal. The right ventricular size is normal.  3. The mitral valve is normal in structure. Trivial mitral valve regurgitation.  4. The aortic valve is normal in structure. Aortic valve regurgitation is trivial. FINDINGS  Left Ventricle: Left ventricular ejection fraction, by estimation, is 65 to 70%. The left ventricle has normal function. The left ventricle has no regional wall motion abnormalities. The left ventricular internal cavity size was normal in size. There is  no left ventricular hypertrophy. Left ventricular diastolic parameters were normal. Right Ventricle: The right ventricular size is normal. No increase in right ventricular wall thickness. Right ventricular systolic function is normal. Left Atrium: Left atrial size was normal in size. Right Atrium: Right atrial size was normal in size. Pericardium: There is no evidence of pericardial effusion. Mitral Valve: The mitral valve is normal in structure. Trivial mitral valve regurgitation. Tricuspid Valve: The tricuspid valve is normal in structure. Tricuspid valve regurgitation is trivial. Aortic Valve: The aortic valve is normal in structure. Aortic valve regurgitation is trivial. Aortic valve mean gradient measures 10.0 mmHg. Aortic valve peak  gradient measures 15.4 mmHg. Aortic valve area, by VTI measures 2.17 cm. Pulmonic Valve: The pulmonic valve was normal in structure. Pulmonic valve regurgitation is not visualized. Aorta: The aortic root and ascending aorta are structurally normal, with no evidence of dilitation. IAS/Shunts: No atrial level shunt detected by color flow Doppler.  LEFT VENTRICLE PLAX 2D LVIDd:         3.65 cm  Diastology LVIDs:         2.69 cm  LV e' medial:    10.10 cm/s LV PW:         0.92 cm  LV E/e' medial:  8.3 LV IVS:        1.00 cm  LV e' lateral:   12.30 cm/s LVOT diam:     2.00 cm  LV E/e' lateral: 6.8 LV SV:          55 LV SV Index:   28 LVOT Area:     3.14 cm  RIGHT VENTRICLE RV Basal diam:  3.37 cm RV S prime:     23.60 cm/s TAPSE (M-mode): 2.5 cm LEFT ATRIUM             Index       RIGHT ATRIUM           Index LA diam:        4.10 cm 2.11 cm/m  RA Area:     11.20 cm LA Vol (A2C):   41.7 ml 21.46 ml/m RA Volume:   24.90 ml  12.81 ml/m LA Vol (A4C):   26.7 ml 13.74 ml/m LA Biplane Vol: 35.8 ml 18.42 ml/m  AORTIC VALVE AV Area (Vmax):    1.73 cm AV Area (Vmean):   1.83 cm AV Area (VTI):     2.17 cm AV Vmax:           196.00 cm/s AV Vmean:          148.000 cm/s AV VTI:            0.254 m AV Peak Grad:      15.4 mmHg AV Mean Grad:      10.0 mmHg LVOT Vmax:         108.00 cm/s LVOT Vmean:        86.100 cm/s LVOT VTI:          0.176 m LVOT/AV VTI ratio: 0.69  AORTA Ao Root diam: 3.10 cm MITRAL VALVE                TRICUSPID VALVE MV Area (PHT): 3.85 cm     TR Peak grad:   22.3 mmHg MV Decel Time: 197 msec     TR Vmax:        236.00 cm/s MV E velocity: 83.40 cm/s MV A velocity: 107.00 cm/s  SHUNTS MV E/A ratio:  0.78         Systemic VTI:  0.18 m                             Systemic Diam: 2.00 cm Arnoldo Hooker MD Electronically signed by Arnoldo Hooker MD Signature Date/Time: 09/17/2020/12:36:11 PM    Final       Assessment/Plan 1.  Gangrenous changes to the right lower extremity with clearly mall perfused foot and leg.  This is a critical and limb threatening situation.  I suspect that even with revascularization, amputation is the most likely outcome.  He does need angiogram to see  at what level amputation would be and to give him any chance at limb salvage.  Podiatry has seen the patient.  We will try to get him on the schedule for later today as the schedule allows.  If we are unable to get his angiogram done today, this will be done Monday. 2.  Diabetes. Stable on outpatient medications and blood glucose control important in reducing the progression of atherosclerotic disease. Also, involved in wound healing. On  appropriate medications. 3.  Hypertension.  Stable on outpatient medications and blood pressure control important in reducing the progression of atherosclerotic disease. On appropriate oral medications. 4.  Alcohol and tobacco abuse.  Tobacco abuse is part of a risk factor for arterial disease and alcohol abuse may lead to withdrawal and other issues while in house.   Marquisha Nikolov, MD  10/10/2020 1:21 PM    This note was created with Dragon medical transcription system.  Any error is purely unintentional  

## 2020-10-10 NOTE — Consult Note (Addendum)
ANTICOAGULATION CONSULT NOTE   Pharmacy Consult for heparin Indication:  limb ischemia  No Known Allergies  Patient Measurements: Height: 5\' 10"  (177.8 cm) Weight: 76.7 kg (169 lb) IBW/kg (Calculated) : 73 Heparin Dosing Weight: 76.7 kg   Vital Signs: Temp: 98 F (36.7 C) (06/17 0359) Temp Source: Oral (06/17 0359) BP: 91/54 (06/17 0359) Pulse Rate: 86 (06/17 0359)  Labs: Recent Labs    10/09/20 1750 10/10/20 0618  HGB 8.7* 7.8*  HCT 29.3* 26.1*  PLT 582* 437*  HEPARINUNFRC  --  0.42  CREATININE 0.82  --      Estimated Creatinine Clearance: 90.3 mL/min (by C-G formula based on SCr of 0.82 mg/dL).   Medical History: Past Medical History:  Diagnosis Date   Diabetes mellitus without complication (HCC)    Hypertension    Peripheral vascular disease (HCC)     Medications:  Xarelto on PTA med list -- unknown last dose on group home MAR going back to 10/02/20  Assessment: 65 yo M presenting from wound care for treatment of gangrene in R foot. XR concerning for osteomyelitis. PMH includes HTN, diabetes, and peripheral vascular disease. Pharmacy consulted to dose and monitor heparin.   WBC 8.9, afebrile  Baseline aPTT, HL ordered, PT/INR  Goal of Therapy:  Heparin level 0.3-0.7 units/ml Monitor platelets by anticoagulation protocol: Yes  06/17 0618 HL 0.42, aPTT 77    Plan:  HL therapeutic x 1. Continue heparin infusion at 1200 units/kr.  Per lab tech, pt does not like"getting suck" and wanted to "just be left alone" last night when attempting to draw baseline aPTT lvl last night.  Will recheck levels every 8 hours instead of 6 hrs to minimize the number of needle sticks.  Recheck HL in 8 hrs to confirm. CBC daily while on heparin.  7/17, PharmD, [facility] 10/10/2020 7:05 AM

## 2020-10-10 NOTE — Op Note (Signed)
Brad Frost  Percutaneous Study/Intervention Procedural Note   Date of Surgery: 10/10/2020  Surgeon(s):Ermina Oberman    Assistants:none  Pre-operative Diagnosis: PAD with gangrene right lower extremity  Post-operative diagnosis:  Same  Procedure(s) Performed:             1.  Ultrasound guidance for vascular access left femoral artery             2.  Catheter placement into right common femoral artery from left femoral approach             3.  Aortogram and selective right lower extremity angiogram             4.  StarClose closure device left femoral artery  EBL: 3 cc  Contrast: 20 cc  Fluoro Time: 1.6 minutes  Moderate Conscious Sedation Time: approximately 7 minutes using 3 mg of Versed and 50 mcg of Fentanyl              Indications:  Patient is a 65 y.o.male with gangrenous changes to the right foot and lower leg. The patient is brought in for angiography for further evaluation and potential treatment.  Due to the limb threatening nature of the situation, angiogram was performed for attempted limb salvage. The patient is aware that if the procedure fails, amputation would be expected.  The patient also understands that even with successful revascularization, amputation may still be required due to the severity of the situation.  Risks and benefits are discussed and informed consent is obtained.   Procedure:  The patient was identified and appropriate procedural time out was performed.  The patient was then placed supine on the table and prepped and draped in the usual sterile fashion. Moderate conscious sedation was administered during a face to face encounter with the patient throughout the procedure with my supervision of the RN administering medicines and monitoring the patient's vital signs, pulse oximetry, telemetry and mental status throughout from the start of the procedure until the patient was taken to the recovery room. Ultrasound was used to evaluate  the left common femoral artery.  It was patent .  A digital ultrasound image was acquired.  A Seldinger needle was used to access the left common femoral artery under direct ultrasound guidance and a permanent image was performed.  A 0.035 J wire was advanced without resistance and a 5Fr sheath was placed.  Pigtail catheter was placed into the aorta and an AP aortogram was performed. This demonstrated normal renal arteries and normal aorta and iliac segments without significant stenosis. I then crossed the aortic bifurcation and advanced to the right femoral head. Selective right lower extremity angiogram was then performed. This demonstrated normal common femoral artery with a large profunda femoris artery but a flush occlusion of the SFA.  There is total occlusion of the superficial femoral artery and popliteal artery with faint reconstitution of a diseased peroneal artery and posterior tibial artery.  The posterior tibial artery occluded again after reconstitution above the ankle and did not fill the foot.  The peroneal artery went to its typical course to the ankle.  The patient had essentially no revascularization options that would be suitable.  He had already had vein harvest from that side and with open wounds and gangrenous changes prostatic would not be appropriate and really did not have a good target for distal bypass.  His tissue loss is so extensive venous disease and malperfusion is so severe, I think at this point an  above-knee amputation is really the only reasonable option.  I elected to terminate the procedure. The sheath was removed and StarClose closure device was deployed in the left femoral artery with excellent hemostatic result. The patient was taken to the recovery room in stable condition having tolerated the procedure well.  Findings:               Aortogram: Normal renal arteries, normal aorta and iliac arteries without significant stenosis             Right lower Extremity: Normal  common femoral artery with a large profunda femoris artery but a flush occlusion of the SFA.  There is total occlusion of the superficial femoral artery and popliteal artery with faint reconstitution of a diseased peroneal artery and posterior tibial artery.  The posterior tibial artery occluded again after reconstitution above the ankle and did not fill the foot.  The peroneal artery went to its typical course to the ankle.   Disposition: Patient was taken to the recovery room in stable condition having tolerated the procedure well.  Complications: None  Brad Frost 10/10/2020 3:36 PM   This note was created with Dragon Medical transcription system. Any errors in dictation are purely unintentional.

## 2020-10-10 NOTE — Consult Note (Signed)
[facility] Vascular Consult Note  MRN : [id number]  Brad RichardFrederick L Frost is a 65 y.o. (01/12/1956) male who presents with chief complaint of  Chief Complaint  Patient presents with   Wound Infection  .  History of Present Illness: Presents to the hospital with gangrene of the right foot and we are consulted by Dr. Scotty CourtStafford [facility] department for vascular evaluation.  He does not see a vascular surgeon.  He has longstanding ulcerations to the right foot which are gradually worsening.  He has dark eschar on the top of the foot, on the heel, and a nonhealing wound on the posterior calf as well as toe ulcerations.  He has thick scaling skin and significant swelling in both lower extremities.  The legs are chronically sore and swollen.  The has alcohol and tobacco dependence as well as multiple other issues as listed below.  He is a poor historian.  He presented to the emergency department because his pain has just gotten to the point that he can no longer bear it.  Current Facility-Administered Medications  Medication Dose Route Frequency Provider Last Rate Last Admin   0.9 %  sodium chloride infusion   Intravenous Continuous Agbata, Tochukwu, MD 100 mL/hr at 10/10/20 0827 New Bag at 10/10/20 0827   0.9 %  sodium chloride infusion   Intravenous Continuous Rajni Holsworth, Marlow BaarsJason S, MD       acetaminophen (TYLENOL) tablet 650 mg  650 mg Oral Q4H PRN Agbata, Tochukwu, MD       aspirin EC tablet 81 mg  81 mg Oral Daily Agbata, Tochukwu, MD   81 mg at 10/09/20 2345   atorvastatin (LIPITOR) tablet 80 mg  80 mg Oral Daily Lolita PatellaSreenath, Sudheer B, MD   80 mg at 10/10/20 1230   ceFAZolin (ANCEF) IVPB 1 g/50 mL premix  1 g Intravenous Q8H Agbata, Tochukwu, MD       ceFAZolin (ANCEF) IVPB 2g/100 mL premix  2 g Intravenous 30 min Pre-Op Annice Needyew, Lucile Hillmann S, MD       heparin ADULT infusion 100 units/mL (25000 units/23350mL)  1,200 Units/hr Intravenous Continuous Sharen HonesDolan, Carissa E, RPH 12 mL/hr at  10/10/20 1230 1,200 Units/hr at 10/10/20 1230   metoprolol succinate (TOPROL-XL) 24 hr tablet 25 mg  25 mg Oral Daily Agbata, Tochukwu, MD       morphine 2 MG/ML injection 2 mg  2 mg Intravenous Q4H PRN Agbata, Tochukwu, MD       ondansetron (ZOFRAN) tablet 4 mg  4 mg Oral Q6H PRN Agbata, Tochukwu, MD       Or   ondansetron (ZOFRAN) injection 4 mg  4 mg Intravenous Q6H PRN Agbata, Tochukwu, MD       tamsulosin (FLOMAX) capsule 0.4 mg  0.4 mg Oral Daily Agbata, Tochukwu, MD   0.4 mg at 10/10/20 0815    Past Medical History:  Diagnosis Date   Diabetes mellitus without complication (HCC)    Hypertension    Peripheral vascular disease (HCC)     Past Surgical History:  Procedure Laterality Date   LEG SURGERY  August 20, 2014   Right Leg  BPG   LOWER EXTREMITY ANGIOGRAPHY Left 09/19/2020   Procedure: Lower Extremity Angiography;  Surgeon: Renford DillsSchnier, Gregory G, MD;  Location: [facility] INVASIVE CV LAB;  Service: Cardiovascular;  Laterality: Left;     Social History   Tobacco Use   Smoking status: Former    Pack years: 0.00    Types: Cigarettes  Smokeless tobacco: Never  Substance Use Topics   Alcohol use: Yes   Drug use: No     Family History  Problem Relation Age of Onset   Hypertension Mother   No bleeding disorders, clotting disorders, autoimmune diseases, or aneurysms  No Known Allergies   REVIEW OF SYSTEMS (Negative unless checked)  Constitutional: [] Weight loss  [] Fever  [] Chills Cardiac: [] Chest pain   [] Chest pressure   [] Palpitations   [] Shortness of breath when laying flat   [] Shortness of breath at rest   [] Shortness of breath with exertion. Vascular:  [] Pain in legs with walking   [] Pain in legs at rest   [] Pain in legs when laying flat   [] Claudication   [x] Pain in feet when walking  [x] Pain in feet at rest  [x] Pain in feet when laying flat   [] History of DVT   [] Phlebitis   [x] Swelling in legs   [] Varicose veins   [x] Non-healing ulcers Pulmonary:   [] Uses home oxygen    [] Productive cough   [] Hemoptysis   [] Wheeze  [] COPD   [] Asthma Neurologic:  [] Dizziness  [] Blackouts   [] Seizures   [] History of stroke   [] History of TIA  [] Aphasia   [] Temporary blindness   [] Dysphagia   [] Weakness or numbness in arms   [] Weakness or numbness in legs Musculoskeletal:  [x] Arthritis   [] Joint swelling   [x] Joint pain   [] Low back pain Hematologic:  [] Easy bruising  [] Easy bleeding   [] Hypercoagulable state   [] Anemic  [] Hepatitis Gastrointestinal:  [] Blood in stool   [] Vomiting blood  [] Gastroesophageal reflux/heartburn   [] Difficulty swallowing. Genitourinary:  [] Chronic kidney disease   [] Difficult urination  [] Frequent urination  [] Burning with urination   [] Blood in urine Skin:  [] Rashes   [x] Ulcers   [x] Wounds Psychological:  [] History of anxiety   []  History of major depression.  Physical Examination  Vitals:   10/09/20 2141 10/10/20 0359 10/10/20 0814 10/10/20 1111  BP: 107/66 (!) 91/54 107/72 107/72  Pulse: 88 86 87 86  Resp: 17 16 18 17   Temp: 97.8 F (36.6 C) 98 F (36.7 C) 98.2 F (36.8 C) 98.4 F (36.9 C)  TempSrc: Oral Oral Oral Oral  SpO2: 100% 93% 97% 100%  Weight:      Height:       Body mass index is 24.25 kg/m. Gen:  WD/WN, NAD Head: [place]/AT, No temporalis wasting.  Ear/Nose/Throat: Hearing grossly intact, nares w/o erythema or drainage, oropharynx w/o Erythema/Exudate Eyes: Sclera non-icteric, conjunctiva clear Neck: Trachea midline.  No JVD.  Pulmonary:  Good air movement, respirations not labored, equal bilaterally.  Cardiac: RRR, normal S1, S2. Vascular:  Vessel Right Left  Radial Palpable Palpable                          PT Not palpable Not palpable  DP Not palpable 1+ palpable   Musculoskeletal: M/S 5/5 throughout.  Right foot is chronically ischemic.  Both legs have 1-2+ swelling with thick scaling skin consistent with chronic lymphedema.  Ulceration and gangrenous changes to the right foot as above Neurologic: Sensation  grossly intact in extremities.  Symmetrical.  Speech is fluent. Motor exam as listed above. Psychiatric: Judgment and insight appear fair.  He is not a great historian. Dermatologic: Multiple open wounds and gangrenous eschar to the right foot.  These include the right heel, the plantar medial aspect of the right foot, several toes, and a quarter sized ulceration on the right posterior calf.  CBC Lab Results  Component Value Date   WBC 9.0 10/10/2020   HGB 7.8 (L) 10/10/2020   HCT 26.1 (L) 10/10/2020   MCV 79.8 (L) 10/10/2020   PLT 437 (H) 10/10/2020    BMET    Component Value Date/Time   NA 140 10/10/2020 0618   K 3.6 10/10/2020 0618   CL 109 10/10/2020 0618   CO2 25 10/10/2020 0618   GLUCOSE 86 10/10/2020 0618   BUN 6 (L) 10/10/2020 0618   CREATININE 0.75 10/10/2020 0618   CALCIUM 8.1 (L) 10/10/2020 0618   GFRNONAA >60 10/10/2020 0618   GFRAA >60 04/14/2019 1345   Estimated Creatinine Clearance: 92.5 mL/min (by C-G formula based on SCr of 0.75 mg/dL).  COAG Lab Results  Component Value Date   INR 1.2 10/10/2020   INR 3.1 (H) 09/18/2020   INR 1.0 09/16/2020    Radiology DG Tibia/Fibula Right  Result Date: 10/09/2020 CLINICAL DATA:  65 year old male with right foot gangrene. EXAM: RIGHT FOOT - 2 VIEW; RIGHT TIBIA AND FIBULA - 2 VIEW COMPARISON:  Right foot radiograph dated 09/16/2020. FINDINGS: There is no acute fracture or dislocation. Evaluation however is very limited due to advanced osteopenia. There is degenerative changes of the first MTP joint. No periosteal elevation or bone erosion to suggest acute osteomyelitis. Evaluation however is very limited. MRI may provide better evaluation if there is high clinical concern for acute osteomyelitis. There is degenerative changes of the ankle. There is diffuse subcutaneous edema as well as vascular calcification. No radiopaque foreign object or soft tissue gas. IMPRESSION: 1. No acute fracture or dislocation. 2.  Osteopenia. 3. Diffuse subcutaneous edema. Electronically Signed   By: Elgie Collard M.D.   On: 10/09/2020 19:09   PERIPHERAL VASCULAR CATHETERIZATION  Result Date: 09/19/2020 See Op Note  DG Chest Portable 1 View  Result Date: 10/09/2020 CLINICAL DATA:  65 year old male with right foot pain. EXAM: PORTABLE CHEST 1 VIEW COMPARISON:  Chest radiograph dated 09/16/2020. FINDINGS: No focal consolidation, pleural effusion, or pneumothorax. Minimal left lung base atelectasis. The cardiac silhouette is within limits. No acute osseous pathology. IMPRESSION: No active disease. Electronically Signed   By: Elgie Collard M.D.   On: 10/09/2020 19:06   DG Chest Port 1 View  Result Date: 09/16/2020 CLINICAL DATA:  Bilateral leg infections. EXAM: PORTABLE CHEST 1 VIEW COMPARISON:  Chest x-ray 04/12/2019. FINDINGS: Mediastinum hilar structures normal. Low lung volumes. Mild left base atelectasis/scarring. Mild left base infiltrate cannot be excluded. Stable elevation left hemidiaphragm. No pleural effusion or pneumothorax. IMPRESSION: Mild left base atelectasis/scarring. Mild left base infiltrate cannot be excluded. Stable elevation left hemidiaphragm. Electronically Signed   By: Maisie Fus  Register   On: 09/16/2020 15:31   DG Foot 2 Views Right  Result Date: 10/09/2020 CLINICAL DATA:  65 year old male with right foot gangrene. EXAM: RIGHT FOOT - 2 VIEW; RIGHT TIBIA AND FIBULA - 2 VIEW COMPARISON:  Right foot radiograph dated 09/16/2020. FINDINGS: There is no acute fracture or dislocation. Evaluation however is very limited due to advanced osteopenia. There is degenerative changes of the first MTP joint. No periosteal elevation or bone erosion to suggest acute osteomyelitis. Evaluation however is very limited. MRI may provide better evaluation if there is high clinical concern for acute osteomyelitis. There is degenerative changes of the ankle. There is diffuse subcutaneous edema as well as vascular calcification.  No radiopaque foreign object or soft tissue gas. IMPRESSION: 1. No acute fracture or dislocation. 2. Osteopenia. 3. Diffuse subcutaneous edema. Electronically Signed  By: Elgie Collard M.D.   On: 10/09/2020 19:09   DG Foot 2 Views Right  Result Date: 09/16/2020 CLINICAL DATA:  ?Osteomyelitis EXAM: RIGHT FOOT - 2 VIEW COMPARISON:  None. FINDINGS: Diffuse osteopenia. There is diffuse soft tissue swelling with possible wound at the lateral hindfoot. There is no evidence of acute fracture. There is mild first MTP degenerative change in additional scattered interphalangeal joint degenerative change. Vascular calcifications. IMPRESSION: No radiographic evidence of osteomyelitis. MRI would be more sensitive. Diffuse soft tissue swelling with possible wound along the lateral hindfoot. Diffuse osteopenia.  Mild first MTP degenerative arthritis. Electronically Signed   By: Caprice Renshaw   On: 09/16/2020 15:32   ECHOCARDIOGRAM COMPLETE  Result Date: 09/17/2020    ECHOCARDIOGRAM REPORT   Patient Name:   Brad Frost [facility] Date of Exam: 09/16/2020 Medical Rec #:  [id number]         Height:       70.0 in Accession #:    [id number]        Weight:       169.0 lb Date of Birth:  Jun 21, 1955          BSA:          1.943 m Patient Age:    64 years          BP:           116/72 mmHg Patient Gender: M                 HR:           126 bpm. Exam Location:  [facility] Procedure: 2D Echo, Cardiac Doppler and Color Doppler Indications:     I50.31 Acute Diastolic CHF  History:         Patient has prior history of Echocardiogram examinations, most                  recent 04/13/2019. Risk Factors:Hypertension and Diabetes.                  Peripheral vascular disease.  Sonographer:     Sedonia Small Rodgers-Jones Referring Phys:  [id number] Marrion Coy Diagnosing Phys: Arnoldo Hooker MD IMPRESSIONS  1. Left ventricular ejection fraction, by estimation, is 65 to 70%. The left ventricle has normal function. The left ventricle has no regional wall motion  abnormalities. Left ventricular diastolic parameters were normal.  2. Right ventricular systolic function is normal. The right ventricular size is normal.  3. The mitral valve is normal in structure. Trivial mitral valve regurgitation.  4. The aortic valve is normal in structure. Aortic valve regurgitation is trivial. FINDINGS  Left Ventricle: Left ventricular ejection fraction, by estimation, is 65 to 70%. The left ventricle has normal function. The left ventricle has no regional wall motion abnormalities. The left ventricular internal cavity size was normal in size. There is  no left ventricular hypertrophy. Left ventricular diastolic parameters were normal. Right Ventricle: The right ventricular size is normal. No increase in right ventricular wall thickness. Right ventricular systolic function is normal. Left Atrium: Left atrial size was normal in size. Right Atrium: Right atrial size was normal in size. Pericardium: There is no evidence of pericardial effusion. Mitral Valve: The mitral valve is normal in structure. Trivial mitral valve regurgitation. Tricuspid Valve: The tricuspid valve is normal in structure. Tricuspid valve regurgitation is trivial. Aortic Valve: The aortic valve is normal in structure. Aortic valve regurgitation is trivial. Aortic valve mean gradient measures 10.0 mmHg. Aortic valve peak  gradient measures 15.4 mmHg. Aortic valve area, by VTI measures 2.17 cm. Pulmonic Valve: The pulmonic valve was normal in structure. Pulmonic valve regurgitation is not visualized. Aorta: The aortic root and ascending aorta are structurally normal, with no evidence of dilitation. IAS/Shunts: No atrial level shunt detected by color flow Doppler.  LEFT VENTRICLE PLAX 2D LVIDd:         3.65 cm  Diastology LVIDs:         2.69 cm  LV e' medial:    10.10 cm/s LV PW:         0.92 cm  LV E/e' medial:  8.3 LV IVS:        1.00 cm  LV e' lateral:   12.30 cm/s LVOT diam:     2.00 cm  LV E/e' lateral: 6.8 LV SV:          55 LV SV Index:   28 LVOT Area:     3.14 cm  RIGHT VENTRICLE RV Basal diam:  3.37 cm RV S prime:     23.60 cm/s TAPSE (M-mode): 2.5 cm LEFT ATRIUM             Index       RIGHT ATRIUM           Index LA diam:        4.10 cm 2.11 cm/m  RA Area:     11.20 cm LA Vol (A2C):   41.7 ml 21.46 ml/m RA Volume:   24.90 ml  12.81 ml/m LA Vol (A4C):   26.7 ml 13.74 ml/m LA Biplane Vol: 35.8 ml 18.42 ml/m  AORTIC VALVE AV Area (Vmax):    1.73 cm AV Area (Vmean):   1.83 cm AV Area (VTI):     2.17 cm AV Vmax:           196.00 cm/s AV Vmean:          148.000 cm/s AV VTI:            0.254 m AV Peak Grad:      15.4 mmHg AV Mean Grad:      10.0 mmHg LVOT Vmax:         108.00 cm/s LVOT Vmean:        86.100 cm/s LVOT VTI:          0.176 m LVOT/AV VTI ratio: 0.69  AORTA Ao Root diam: 3.10 cm MITRAL VALVE                TRICUSPID VALVE MV Area (PHT): 3.85 cm     TR Peak grad:   22.3 mmHg MV Decel Time: 197 msec     TR Vmax:        236.00 cm/s MV E velocity: 83.40 cm/s MV A velocity: 107.00 cm/s  SHUNTS MV E/A ratio:  0.78         Systemic VTI:  0.18 m                             Systemic Diam: 2.00 cm Arnoldo Hooker MD Electronically signed by Arnoldo Hooker MD Signature Date/Time: 09/17/2020/12:36:11 PM    Final       Assessment/Plan 1.  Gangrenous changes to the right lower extremity with clearly mall perfused foot and leg.  This is a critical and limb threatening situation.  I suspect that even with revascularization, amputation is the most likely outcome.  He does need angiogram to see  at what level amputation would be and to give him any chance at limb salvage.  Podiatry has seen the patient.  We will try to get him on the schedule for later today as the schedule allows.  If we are unable to get his angiogram done today, this will be done Monday. 2.  Diabetes. Stable on outpatient medications and blood glucose control important in reducing the progression of atherosclerotic disease. Also, involved in wound healing. On  appropriate medications. 3.  Hypertension.  Stable on outpatient medications and blood pressure control important in reducing the progression of atherosclerotic disease. On appropriate oral medications. 4.  Alcohol and tobacco abuse.  Tobacco abuse is part of a risk factor for arterial disease and alcohol abuse may lead to withdrawal and other issues while in house.   [name], MD  10/10/2020 1:21 PM    This note was created with Dragon medical transcription system.  Any error is purely unintentional

## 2020-10-10 NOTE — TOC Initial Note (Signed)
Transition of Care [facility]) - Initial/Assessment Note    Patient Details  Name: Brad Frost MRN: [id number] Date of Birth: 08-02-1955  Transition of [facility]) CM/SW Contact:    Chapman Fitch, RN Phone Number: 10/10/2020, 1:54 PM  Clinical Narrative:                 Patient admitted with Gangrene.  Patient admitted from [facility].  He was there for short term rehab  Patient states prior to that he was living alone in an apartment  Plan for angiogram with likley amputation.  Will need PT eval when medically appropriate.    Per patient "the care I was getting there was terrible,  I will not got back".  Patient states that he would consider another facility at discharge, pending SNF recommendation    Expected Discharge Plan: Skilled Nursing Facility Barriers to Discharge: Continued Medical Work up   Patient Goals and CMS Choice        Expected Discharge Plan and Services Expected Discharge Plan: Skilled Nursing Facility       Living arrangements for the past 2 months: Skilled Nursing Facility                                      Prior Living Arrangements/Services Living arrangements for the past 2 months: Skilled Nursing Facility Lives with:: Facility Resident Patient language and need for interpreter reviewed:: Yes Do you feel safe going back to the place where you live?: No   Does not want to return to [facility].  will consider another facility  Need for Family Participation in Patient Care: Yes (Comment) Care giver support system in place?: Yes (comment)   Criminal Activity/Legal Involvement Pertinent to Current Situation/Hospitalization: No - Comment as needed  Activities of Daily Living Home Assistive Devices/Equipment: Walker (specify type), Wheelchair ADL Screening (condition at time of admission) Patient's cognitive ability adequate to safely complete daily activities?: Yes Is the patient deaf or have difficulty hearing?: No Does  the patient have difficulty seeing, even when wearing glasses/contacts?: No Does the patient have difficulty concentrating, remembering, or making decisions?: No Patient able to express need for assistance with ADLs?: Yes Does the patient have difficulty dressing or bathing?: Yes Independently performs ADLs?: No Communication: Independent Dressing (OT): Needs assistance Is this a change from baseline?: Pre-admission baseline Grooming: Needs assistance Is this a change from baseline?: Pre-admission baseline Feeding: Independent Bathing: Needs assistance Is this a change from baseline?: Pre-admission baseline Toileting: Needs assistance Is this a change from baseline?: Pre-admission baseline In/Out Bed: Needs assistance Is this a change from baseline?: Pre-admission baseline Does the patient have difficulty walking or climbing stairs?: Yes Weakness of Legs: Both Weakness of Arms/Hands: None  Permission Sought/Granted                  Emotional Assessment Appearance:: Appears stated age     Orientation: : Oriented to Self, Oriented to Place, Oriented to  Time, Oriented to Situation Alcohol / Substance Use: Not Applicable Psych Involvement: No (comment)  Admission diagnosis:  Gangrene (HCC) [I96] PVD (peripheral vascular disease) (HCC) [I73.9] Gangrene of right foot (HCC) [I96] Patient Active Problem List   Diagnosis Date Noted   Gangrene (HCC) 10/09/2020   Anemia of chronic disease 10/09/2020   Toe ulcer, right (HCC) 09/17/2020   Cellulitis of lower extremity 09/16/2020   COVID-19 04/13/2019   Hypotension  COVID-19 virus infection 04/04/2019   Pneumonia due to COVID-19 virus    Bilateral pulmonary embolism (HCC)    Lung mass    AKI (acute kidney injury) (HCC)    Type 2 diabetes mellitus with diabetic neuropathy, without long-term current use of insulin (HCC)    PVD (peripheral vascular disease) (HCC) 02/14/2015   PCP:  [name], [name], MD Pharmacy:  No Pharmacies  Listed    Social Determinants of Health (SDOH) Interventions    Readmission Risk Interventions No flowsheet data found.

## 2020-10-10 NOTE — Progress Notes (Addendum)
OT Cancellation Note  Patient Details Name: Brad Frost MRN: [id number] DOB: 02-May-1955   Cancelled Treatment:    Reason Eval/Treat Not Completed: Patient not medically ready. Consult received, chart reviewed. Per chart, pt NPO for angiography and possible vascular surgery/amputation later today 2/2 gangrene right foot. Will hold OT evaluation at this time and re-attempt at later date/time as medically appropriate s/p surgery with any necessary weightbearing restrictions/precautions for therapy.  Wynona Canes, MPH, MS, OTR/L ascom [phone] 10/10/20, 3:02 PM

## 2020-10-10 NOTE — Interval H&P Note (Signed)
History and Physical Interval Note:  10/10/2020 2:15 PM  Brad Frost  has presented today for surgery, with the diagnosis of gangrene right foot.  The various methods of treatment have been discussed with the patient and family. After consideration of risks, benefits and other options for treatment, the patient has consented to  Procedure(s): Lower Extremity Angiography (Right) as a surgical intervention.  The patient's history has been reviewed, patient examined, no change in status, stable for surgery.  I have reviewed the patient's chart and labs.  Questions were answered to the patient's satisfaction.     [name]

## 2020-10-10 NOTE — Progress Notes (Signed)
PROGRESS NOTE    Brad Frost  ZOX:[id number] DOB: 04/02/1956 DOA: 10/09/2020 PCP: [name], [place], MD    Brief Narrative:  65 y.o. male with medical history significant for type 2 diabetes mellitus, essential hypertension, peripheral vascular disease, nicotine dependence, chronic lymphedema and chronic alcohol use who was seen by the wound care specialist and referred to the emergency room on for gangrene involving the toes on his right foot and a large eschar over the dorsum of the right foot. He denies having any fever or chills, no chest pain, no shortness of breath, no dizziness, no lightheadedness, no abdominal pain, no nausea, no vomiting, no palpitations, no diaphoresis, no cough, no urinary symptoms, no changes in his bowel habits.  Started on Ancef for suspected overlying cellulitis and seen in consultation by podiatry and vascular surgery.  Per podiatry no surgical intervention until vascular involvement to evaluate vasculature.  Per vascular surgery note this is a critical limb threatening situation.  Most likely outcome would be amputation.  Patient will need angiography to assess vasculature to the area determine the next best course of action.  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 He is afebrile and has no leukocytosis -Seen in consultation by vascular and podiatry -Per vascular surgery this is a limb  threatening situation and most likely outcome will be amputation -Needs angiography to assess vasculature have any chance of the limb salvage Plan: N.p.o. for angiography today Continue Ancef Continue heparin GTT Vascular surgery follow-up As needed pain control       Peripheral vascular disease Continue aspirin, high intensity statins and beta-blockers Therapy evaluations when able       Anemia of chronic disease H&H is stable     Nicotine dependence Patient states that he has cut back on the number of cigarettes that he smokes since he went to the rest home He declines a nicotine transdermal patch at this time    DVT prophylaxis: Heparin GTT Code Status: Full Family Communication: None today Disposition Plan: Status is: Inpatient  Remains inpatient appropriate because:Inpatient level of care appropriate due to severity of illness  Dispo: The patient is from: SNF              Anticipated d/c is to: SNF              Patient currently is not medically stable to d/c.   Difficult to place patient No       Level of care: Med-Surg  Consultants:  Vascular surgery Podiatry  Procedures:  Lower extremity angiography 6/17  Antimicrobials:  Ancef   Subjective: Patient seen and examined.  Resting in bed.  Visibly no distress.  Objective: Vitals:   10/10/20 0359 10/10/20 0814 10/10/20 1111 10/10/20 1323  BP: (!) 91/54 107/72 107/72 116/71  Pulse: 86 87 86 80  Resp: 16 18 17 15   Temp: 98 F (36.7 C) 98.2 F (36.8 C) 98.4 F (36.9  C) 98.5 F (36.9 C)  TempSrc: Oral Oral Oral Oral  SpO2: 93% 97% 100% 90%  Weight:      Height:        Intake/Output Summary (Last 24 hours) at 10/10/2020 1430 Last data filed at 10/10/2020 0331 Gross per 24 hour  Intake 103.49 ml  Output --  Net 103.49 ml   Filed Weights   10/09/20 1748  Weight: 76.7 kg    Examination:  General exam: Appears calm and comfortable  Respiratory system: Clear to auscultation.  Respiratory effort normal. Cardiovascular system: S1 & S2 heard, RRR. No JVD, murmurs, rubs, gallops or clicks. No pedal edema. Gastrointestinal system: Abdomen is nondistended, soft and nontender. No organomegaly or masses felt. Normal bowel sounds heard. Central nervous system: Alert and oriented. No focal neurological deficits. Extremities: Extensive bilateral lower extremity swelling with multiple necrotic wounds and black eschar formation.  Images and chart Skin: No rashes, lesions or ulcers Psychiatry: Judgement and insight appear normal. Mood & affect appropriate.     Data Reviewed: I have personally reviewed following labs and imaging studies  CBC: Recent Labs  Lab 10/09/20 1750 10/10/20 0618  WBC 8.9 9.0  NEUTROABS 5.5  --   HGB 8.7* 7.8*  HCT 29.3* 26.1*  MCV 80.7 79.8*  PLT 582* 437*   Basic Metabolic Panel: Recent Labs  Lab 10/09/20 1750 10/10/20 0618  NA 135 140  K 4.0 3.6  CL 104 109  CO2 23 25  GLUCOSE 103* 86  BUN 6* 6*  CREATININE 0.82 0.75  CALCIUM 8.3* 8.1*   GFR: Estimated Creatinine Clearance: 92.5 mL/min (by C-G formula based on SCr of 0.75 mg/dL). Liver Function Tests: Recent Labs  Lab 10/09/20 1750  AST 15  ALT 11  ALKPHOS 73  BILITOT 0.5  PROT 7.4  ALBUMIN 2.4*   No results for input(s): LIPASE, AMYLASE in the last 168 hours. No results for input(s): AMMONIA in the last 168 hours. Coagulation Profile: Recent Labs  Lab 10/10/20 0621  INR 1.2   Cardiac Enzymes: No results for input(s): CKTOTAL, CKMB, CKMBINDEX, TROPONINI in the last 168 hours. BNP (last 3 results) No results for input(s): PROBNP in the last 8760 hours. HbA1C: No results for input(s): HGBA1C in the last 72 hours. CBG: Recent Labs  Lab 10/09/20 1752  GLUCAP 84   Lipid Profile: No results for input(s): CHOL, HDL, LDLCALC, TRIG, CHOLHDL, LDLDIRECT in the last 72 hours. Thyroid Function Tests: No results for input(s): TSH, T4TOTAL, FREET4, T3FREE, THYROIDAB  in the last 72 hours. Anemia Panel: No results for input(s): VITAMINB12, FOLATE, FERRITIN, TIBC, IRON, RETICCTPCT in the last 72 hours. Sepsis Labs: Recent Labs  Lab 10/09/20 1950 10/10/20 2637  LATICACIDVEN 1.8 1.1    Recent Results (from the past 240 hour(s))  Resp Panel by RT-PCR (Flu A&B, Covid) Nasopharyngeal Swab     Status: None   Collection Time: 10/09/20  6:35 PM   Specimen: Nasopharyngeal Swab; Nasopharyngeal(NP) swabs in vial transport medium  Result Value Ref Range Status   SARS Coronavirus 2 by RT PCR NEGATIVE NEGATIVE Final    Comment: (NOTE) SARS-CoV-2 target nucleic acids are NOT DETECTED.  The SARS-CoV-2 RNA is generally detectable in upper respiratory specimens during the acute phase of infection. The lowest concentration of SARS-CoV-2 viral copies this assay can detect is 138 copies/mL. A negative result does not preclude SARS-Cov-2 infection and should not be used as the sole basis for treatment or other patient management decisions. A negative result may occur  with  improper specimen collection/handling, submission of specimen other than nasopharyngeal swab, presence of viral mutation(s) within the areas targeted by this assay, and inadequate number of viral copies(<138 copies/mL). A negative result must be combined with clinical observations, patient history, and epidemiological information. The expected result is Negative.  Fact Sheet for Patients:  BloggerCourse.com  Fact Sheet for Healthcare Providers:  SeriousBroker.it  This test is no t yet approved or cleared by the [place] FDA and  has been authorized for detection and/or diagnosis of SARS-CoV-2 by FDA under an Emergency Use Authorization (EUA). This EUA will remain  in effect (meaning this test can be used) for the duration of the COVID-19 declaration under Section 564(b)(1) of the Act, 21 U.S.C.section 360bbb-3(b)(1), unless the  authorization is terminated  or revoked sooner.       Influenza A by PCR NEGATIVE NEGATIVE Final   Influenza B by PCR NEGATIVE NEGATIVE Final    Comment: (NOTE) The Xpert Xpress SARS-CoV-2/FLU/RSV plus assay is intended as an aid in the diagnosis of influenza from Nasopharyngeal swab specimens and should not be used as a sole basis for treatment. Nasal washings and aspirates are unacceptable for Xpert Xpress SARS-CoV-2/FLU/RSV testing.  Fact Sheet for Patients: BloggerCourse.com  Fact Sheet for Healthcare Providers: SeriousBroker.it  This test is not yet approved or cleared by the [place] FDA and has been authorized for detection and/or diagnosis of SARS-CoV-2 by FDA under an Emergency Use Authorization (EUA). This EUA will remain in effect (meaning this test can be used) for the duration of the COVID-19 declaration under Section 564(b)(1) of the Act, 21 U.S.C. section 360bbb-3(b)(1), unless the authorization is terminated or revoked.  Performed at [facility], [address] [place], [place] [address]          Radiology Studies: DG Tibia/Fibula Right  Result Date: 10/09/2020 CLINICAL DATA:  65 year old male with right foot gangrene. EXAM: RIGHT FOOT - 2 VIEW; RIGHT TIBIA AND FIBULA - 2 VIEW COMPARISON:  Right foot radiograph dated 09/16/2020. FINDINGS: There is no acute fracture or dislocation. Evaluation however is very limited due to advanced osteopenia. There is degenerative changes of the first MTP joint. No periosteal elevation or bone erosion to suggest acute osteomyelitis. Evaluation however is very limited. MRI may provide better evaluation if there is high clinical concern for acute osteomyelitis. There is degenerative changes of the ankle. There is diffuse subcutaneous edema as well as vascular calcification. No radiopaque foreign object or soft tissue gas. IMPRESSION: 1. No acute fracture or  dislocation. 2. Osteopenia. 3. Diffuse subcutaneous edema. Electronically Signed   By: Elgie Collard M.D.   On: 10/09/2020 19:09   DG Chest Portable 1 View  Result Date: 10/09/2020 CLINICAL DATA:  64 year old male with right foot pain. EXAM: PORTABLE CHEST 1 VIEW COMPARISON:  Chest radiograph dated 09/16/2020. FINDINGS: No focal consolidation, pleural effusion, or pneumothorax. Minimal left lung base atelectasis. The cardiac silhouette is within limits. No acute osseous pathology. IMPRESSION: No active disease. Electronically Signed   By: Elgie Collard M.D.   On: 10/09/2020 19:06   DG Foot 2 Views Right  Result Date: 10/09/2020 CLINICAL DATA:  65 year old male with right foot gangrene. EXAM: RIGHT FOOT - 2 VIEW; RIGHT TIBIA AND FIBULA - 2 VIEW COMPARISON:  Right foot radiograph dated 09/16/2020. FINDINGS: There is no acute fracture or dislocation. Evaluation however is very limited due to advanced osteopenia. There is degenerative changes of the first MTP joint. No periosteal elevation or bone erosion to  suggest acute osteomyelitis. Evaluation however is very limited. MRI may provide better evaluation if there is high clinical concern for acute osteomyelitis. There is degenerative changes of the ankle. There is diffuse subcutaneous edema as well as vascular calcification. No radiopaque foreign object or soft tissue gas. IMPRESSION: 1. No acute fracture or dislocation. 2. Osteopenia. 3. Diffuse subcutaneous edema. Electronically Signed   By: Elgie Collard M.D.   On: 10/09/2020 19:09        Scheduled Meds:  aspirin EC  81 mg Oral Daily   atorvastatin  80 mg Oral Daily   metoprolol succinate  25 mg Oral Daily   tamsulosin  0.4 mg Oral Daily   Continuous Infusions:  ceFAZolin     sodium chloride 100 mL/hr at 10/10/20 0827   sodium chloride      ceFAZolin (ANCEF) IV      ceFAZolin (ANCEF) IV     heparin Stopped (10/10/20 1230)     LOS: 1 day    Time spent: 25  minutes    Tresa Moore, MD [facility] Pager [phone]  If 7PM-7AM, please contact night-coverage 10/10/2020, 2:30 PM

## 2020-10-10 NOTE — Progress Notes (Signed)
Patient refused to get a second IV but agreed to have IV team stick him in the morning.  He also agreed to a one time lab draw in the morning.  Arturo Morton

## 2020-10-11 DIAGNOSIS — I96 Gangrene, not elsewhere classified: Secondary | ICD-10-CM | POA: Diagnosis not present

## 2020-10-11 MED ORDER — ENOXAPARIN SODIUM 40 MG/0.4ML IJ SOSY
40.0000 mg | PREFILLED_SYRINGE | INTRAMUSCULAR | Status: DC
  Administered 2020-10-11 – 2020-10-13 (×3): 40 mg via SUBCUTANEOUS
  Filled 2020-10-11 (×3): qty 0.4

## 2020-10-11 NOTE — Progress Notes (Signed)
      Daily Progress Note   Assessment/Planning:   POD #1 s/p RLE runoff  Pt has dry gangrene in R foot Angio suggests pt needs R AKA Pt is undecided on AKA at this point and wants me to come back tomorrow   Subjective  - 1 Day Post-Op   No bleeding from left groin   Objective   Vitals:   10/10/20 1636 10/10/20 2034 10/11/20 0529 10/11/20 0747  BP: 99/68 120/75 114/78 110/72  Pulse: 79 81 92 90  Resp: 18 16 18 18   Temp: 97.7 F (36.5 C) 97.7 F (36.5 C) 97.8 F (36.6 C) 98.6 F (37 C)  TempSrc: Oral Oral Oral Oral  SpO2: 95% 100% 99% 97%  Weight:      Height:         Intake/Output Summary (Last 24 hours) at 10/11/2020 0823 Last data filed at 10/11/2020 0611 Gross per 24 hour  Intake 1886.61 ml  Output 675 ml  Net 1211.61 ml    VASC L groin: no bleeding, no hematoma, no bruit    Laboratory   CBC CBC Latest Ref Rng & Units 10/10/2020 10/09/2020 09/23/2020  WBC 4.0 - 10.5 K/uL 9.0 8.9 10.0  Hemoglobin 13.0 - 17.0 g/dL 7.8(L) 8.7(L) 7.9(L)  Hematocrit 39.0 - 52.0 % 26.1(L) 29.3(L) 25.0(L)  Platelets 150 - 400 K/uL 437(H) 582(H) 357    BMET    Component Value Date/Time   NA 140 10/10/2020 0618   K 3.6 10/10/2020 0618   CL 109 10/10/2020 0618   CO2 25 10/10/2020 0618   GLUCOSE 86 10/10/2020 0618   BUN 6 (L) 10/10/2020 0618   CREATININE 0.75 10/10/2020 0618   CALCIUM 8.1 (L) 10/10/2020 0618   GFRNONAA >60 10/10/2020 0618   GFRAA >60 04/14/2019 1345     04/16/2019, MD, FACS, FSVS Covering for [facility]: [phone]  10/11/2020, 8:23 AM

## 2020-10-11 NOTE — Progress Notes (Signed)
PT Cancellation Note  Patient Details Name: Brad Frost MRN: [id number] DOB: [birth year]   Cancelled Treatment:    Reason Eval/Treat Not Completed: Other (comment);Patient declined, no reason specified (Pt declined to participate in PT eval today stating "I am not going to walk today, I need a day without you all coming in here to bother me") PT to follow-up as pt allows.   Devoria Albe 10/11/2020, 11:08 AM

## 2020-10-11 NOTE — Progress Notes (Signed)
PROGRESS NOTE    Brad Frost  ZJQ:[id number] DOB: 01-Mar-1956 DOA: 10/09/2020 PCP: [name], [name], MD    Brief Narrative:  65 y.o. male with medical history significant for type 2 diabetes mellitus, essential hypertension, peripheral vascular disease, nicotine dependence, chronic lymphedema and chronic alcohol use who was seen by the wound care specialist and referred to the emergency room on for gangrene involving the toes on his right foot and a large eschar over the dorsum of the right foot. He denies having any fever or chills, no chest pain, no shortness of breath, no dizziness, no lightheadedness, no abdominal pain, no nausea, no vomiting, no palpitations, no diaphoresis, no cough, no urinary symptoms, no changes in his bowel habits.  Started on Ancef for suspected overlying cellulitis and seen in consultation by podiatry and vascular surgery.  Per podiatry no surgical intervention until vascular involvement to evaluate vasculature.  Per vascular surgery note this is a critical limb threatening situation.  Most likely outcome would be amputation.  Patient will need angiography to assess vasculature to the area determine the next best course of action.  Status post angiography on 6/17.  Vasculature to right lower extremity is severely diseased.  At this point vascular surgery recommending right AKA.  Patient struggling with this decision.  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 He is afebrile and has no leukocytosis -Seen in consultation by vascular and podiatry -Per vascular surgery this is a limb threatening situation and most likely outcome will be amputation -Needs angiography to assess vasculature have any chance of the limb salvage -Angiography 6/17: Extensive tissue loss and severe malperfusion.  Right AKA recommended Plan: Continue Ancef for now.  Patient needs a right AKA.  Struggling with this idea.  Vascular surgery to follow-up     Peripheral vascular disease Continue aspirin, high intensity statins and beta-blockers Therapy evaluations when able       Anemia of chronic disease H&H is stable     Nicotine dependence Patient states that he has cut back on the number of cigarettes that he smokes since he went to the rest home He declines a nicotine transdermal patch at this time    DVT prophylaxis: SQ Lovenox Code Status: Full Family Communication: None today Disposition Plan: Status is: Inpatient  Remains inpatient appropriate because:Inpatient level of care appropriate due to severity of illness  Dispo: The patient is from: SNF              Anticipated d/c is to: SNF              Patient currently is not medically stable to d/c.   Difficult to place patient No  Patient has severe lower extremity vascular disease.  Too much non viable tissue.  Vascular surgery recommending right AKA.  Patient still undecided.     Level of care: Med-Surg  Consultants:  Vascular surgery Podiatry  Procedures:  Lower extremity  angiography 6/17  Antimicrobials:  Ancef   Subjective: Patient seen and examined.  Sitting up in chair.  Upset about AKA recommendation  Objective: Vitals:   10/10/20 2034 10/11/20 0529 10/11/20 0747 10/11/20 1118  BP: 120/75 114/78 110/72 91/67  Pulse: 81 92 90 97  Resp: 16 18 18 18   Temp: 97.7 F (36.5 C) 97.8 F (36.6 C) 98.6 F (37 C) 97.7 F  (36.5 C)  TempSrc: Oral Oral Oral Oral  SpO2: 100% 99% 97% 99%  Weight:      Height:        Intake/Output Summary (Last 24 hours) at 10/11/2020 1210 Last data filed at 10/11/2020 10/13/2020 Gross per 24 hour  Intake 1886.61 ml  Output 675 ml  Net 1211.61 ml   Filed Weights   10/09/20 1748  Weight: 76.7 kg    Examination:  General exam: Appears calm and comfortable  Respiratory system: Clear to auscultation. Respiratory effort normal. Cardiovascular system: S1 & S2 heard, RRR. No JVD, murmurs, rubs, gallops or clicks. No pedal edema. Gastrointestinal system: Abdomen is nondistended, soft and nontender. No organomegaly or masses felt. Normal bowel sounds heard. Central nervous system: Alert and oriented. No focal neurological deficits. Extremities: Extensive bilateral lower extremity swelling with multiple necrotic wounds and black eschar formation.  Images and chart Skin: No rashes, lesions or ulcers Psychiatry: Judgement and insight appear normal. Mood & affect appropriate.     Data Reviewed: I have personally reviewed following labs and imaging studies  CBC: Recent Labs  Lab 10/09/20 1750 10/10/20 0618  WBC 8.9 9.0  NEUTROABS 5.5  --   HGB 8.7* 7.8*  HCT 29.3* 26.1*  MCV 80.7 79.8*  PLT 582* 437*   Basic Metabolic Panel: Recent Labs  Lab 10/09/20 1750 10/10/20 0618  NA 135 140  K 4.0 3.6  CL 104 109  CO2 23 25  GLUCOSE 103* 86  BUN 6* 6*  CREATININE 0.82 0.75  CALCIUM 8.3* 8.1*   GFR: Estimated Creatinine Clearance: 92.5 mL/min (by C-G formula based on SCr of 0.75 mg/dL). Liver Function Tests: Recent Labs  Lab 10/09/20 1750  AST 15  ALT 11  ALKPHOS 73  BILITOT 0.5  PROT 7.4  ALBUMIN 2.4*   No results for input(s): LIPASE, AMYLASE in the last 168 hours. No results for input(s): AMMONIA in the last 168 hours. Coagulation Profile: Recent Labs  Lab 10/10/20 0621  INR 1.2   Cardiac Enzymes: No results for input(s): CKTOTAL, CKMB, CKMBINDEX,  TROPONINI in the last 168 hours. BNP (last 3 results) No results for input(s): PROBNP in the last 8760 hours. HbA1C: No results for input(s): HGBA1C in the last 72 hours. CBG: Recent Labs  Lab 10/09/20 1752  GLUCAP 84   Lipid Profile: No results for input(s): CHOL, HDL, LDLCALC, TRIG, CHOLHDL, LDLDIRECT in the last 72 hours. Thyroid Function Tests: No results for input(s): TSH, T4TOTAL, FREET4, T3FREE, THYROIDAB in the last 72 hours. Anemia Panel: No results for input(s): VITAMINB12, FOLATE, FERRITIN, TIBC, IRON, RETICCTPCT in the last 72 hours. Sepsis Labs: Recent Labs  Lab 10/09/20 1950 10/10/20 10/12/20  LATICACIDVEN 1.8 1.1    Recent Results (from the past 240 hour(s))  Resp Panel by RT-PCR (Flu A&B, Covid) Nasopharyngeal Swab     Status: None   Collection Time: 10/09/20  6:35 PM   Specimen: Nasopharyngeal Swab; Nasopharyngeal(NP) swabs in vial transport medium  Result Value Ref Range Status   SARS Coronavirus 2 by RT PCR NEGATIVE NEGATIVE Final    Comment: (  NOTE) SARS-CoV-2 target nucleic acids are NOT DETECTED.  The SARS-CoV-2 RNA is generally detectable in upper respiratory specimens during the acute phase of infection. The lowest concentration of SARS-CoV-2 viral copies this assay can detect is 138 copies/mL. A negative result does not preclude SARS-Cov-2 infection and should not be used as the sole basis for treatment or other patient management decisions. A negative result may occur with  improper specimen collection/handling, submission of specimen other than nasopharyngeal swab, presence of viral mutation(s) within the areas targeted by this assay, and inadequate number of viral copies(<138 copies/mL). A negative result must be combined with clinical observations, patient history, and epidemiological information. The expected result is Negative.  Fact Sheet for Patients:  BloggerCourse.com  Fact Sheet for Healthcare Providers:   SeriousBroker.it  This test is no t yet approved or cleared by the [place] FDA and  has been authorized for detection and/or diagnosis of SARS-CoV-2 by FDA under an Emergency Use Authorization (EUA). This EUA will remain  in effect (meaning this test can be used) for the duration of the COVID-19 declaration under Section 564(b)(1) of the Act, 21 U.S.C.section 360bbb-3(b)(1), unless the authorization is terminated  or revoked sooner.       Influenza A by PCR NEGATIVE NEGATIVE Final   Influenza B by PCR NEGATIVE NEGATIVE Final    Comment: (NOTE) The Xpert Xpress SARS-CoV-2/FLU/RSV plus assay is intended as an aid in the diagnosis of influenza from Nasopharyngeal swab specimens and should not be used as a sole basis for treatment. Nasal washings and aspirates are unacceptable for Xpert Xpress SARS-CoV-2/FLU/RSV testing.  Fact Sheet for Patients: BloggerCourse.com  Fact Sheet for Healthcare Providers: SeriousBroker.it  This test is not yet approved or cleared by the [place] FDA and has been authorized for detection and/or diagnosis of SARS-CoV-2 by FDA under an Emergency Use Authorization (EUA). This EUA will remain in effect (meaning this test can be used) for the duration of the COVID-19 declaration under Section 564(b)(1) of the Act, 21 U.S.C. section 360bbb-3(b)(1), unless the authorization is terminated or revoked.  Performed at [facility], [address] [place], [place] [address]   Culture, blood (Routine X 2) w Reflex to ID Panel     Status: None (Preliminary result)   Collection Time: 10/10/20  6:16 AM   Specimen: BLOOD  Result Value Ref Range Status   Specimen Description BLOOD LEFT ANTECUBITAL  Final   Special Requests   Final    BOTTLES DRAWN AEROBIC AND ANAEROBIC Blood Culture adequate volume   Culture   Final    NO GROWTH < 24 HOURS Performed at [facility], [address] [place], [place] [address]    Report Status PENDING  Incomplete  Culture, blood (Routine X 2) w Reflex to ID Panel     Status: None (Preliminary result)   Collection Time: 10/10/20  6:21 AM   Specimen: BLOOD  Result Value Ref Range Status   Specimen Description BLOOD BLOOD LEFT HAND  Final   Special Requests   Final    BOTTLES DRAWN AEROBIC AND ANAEROBIC Blood Culture adequate volume   Culture   Final    NO GROWTH < 24 HOURS Performed at [facility], [address] [place], [place] [address]    Report Status PENDING  Incomplete         Radiology Studies: DG Tibia/Fibula Right  Result Date: 10/09/2020 CLINICAL DATA:  65 year old male with right foot gangrene. EXAM: RIGHT FOOT - 2  VIEW; RIGHT TIBIA AND FIBULA - 2 VIEW COMPARISON:  Right foot radiograph dated 09/16/2020. FINDINGS: There is no acute fracture or dislocation. Evaluation however is very limited due to advanced osteopenia. There is degenerative changes of the first MTP joint. No periosteal elevation or bone erosion to suggest acute osteomyelitis. Evaluation however is very limited. MRI may provide better evaluation if there is high clinical concern for acute osteomyelitis. There is degenerative changes of the ankle. There is diffuse subcutaneous edema as well as vascular calcification. No radiopaque foreign object or soft tissue gas. IMPRESSION: 1. No acute fracture or dislocation. 2. Osteopenia. 3. Diffuse subcutaneous edema. Electronically Signed   By: Elgie CollardArash  Radparvar M.D.   On: 10/09/2020 19:09   PERIPHERAL VASCULAR CATHETERIZATION  Result Date: 10/10/2020 See surgical note for result.  DG Chest Portable 1 View  Result Date: 10/09/2020 CLINICAL DATA:  65 year old male with right foot pain. EXAM: PORTABLE CHEST 1 VIEW COMPARISON:  Chest radiograph dated 09/16/2020. FINDINGS: No focal consolidation, pleural effusion, or pneumothorax. Minimal left lung base atelectasis. The cardiac  silhouette is within limits. No acute osseous pathology. IMPRESSION: No active disease. Electronically Signed   By: Elgie CollardArash  Radparvar M.D.   On: 10/09/2020 19:06   DG Foot 2 Views Right  Result Date: 10/09/2020 CLINICAL DATA:  65 year old male with right foot gangrene. EXAM: RIGHT FOOT - 2 VIEW; RIGHT TIBIA AND FIBULA - 2 VIEW COMPARISON:  Right foot radiograph dated 09/16/2020. FINDINGS: There is no acute fracture or dislocation. Evaluation however is very limited due to advanced osteopenia. There is degenerative changes of the first MTP joint. No periosteal elevation or bone erosion to suggest acute osteomyelitis. Evaluation however is very limited. MRI may provide better evaluation if there is high clinical concern for acute osteomyelitis. There is degenerative changes of the ankle. There is diffuse subcutaneous edema as well as vascular calcification. No radiopaque foreign object or soft tissue gas. IMPRESSION: 1. No acute fracture or dislocation. 2. Osteopenia. 3. Diffuse subcutaneous edema. Electronically Signed   By: Elgie CollardArash  Radparvar M.D.   On: 10/09/2020 19:09        Scheduled Meds:  aspirin EC  81 mg Oral Daily   atorvastatin  80 mg Oral Daily   metoprolol succinate  25 mg Oral Daily   tamsulosin  0.4 mg Oral Daily   Continuous Infusions:   ceFAZolin (ANCEF) IV 1 g (10/11/20 0611)     LOS: 2 days    Time spent: 15 minutes    Tresa MooreSudheer B Sara Keys, MD [facility] Pager [phone]  If 7PM-7AM, please contact night-coverage 10/11/2020, 12:10 PM

## 2020-10-11 NOTE — Plan of Care (Signed)

## 2020-10-11 NOTE — Progress Notes (Signed)
OT Cancellation Note  Patient Details Name: DYQUAN MINKS MRN: [id number] DOB: January 20, 1956   Cancelled Treatment:    Reason Eval/Treat Not Completed: Patient declined, no reason specified (Pt. refused therapy today. Per Angiography an AKA is now recommended. Pt. is still deciding about surgery.Will hold OT today, monitor pt., and intervene as appropriate.)  Olegario Messier, MS, OTR/L 10/11/2020, 4:14 PM

## 2020-10-12 DIAGNOSIS — I96 Gangrene, not elsewhere classified: Secondary | ICD-10-CM | POA: Diagnosis not present

## 2020-10-12 MED ORDER — SODIUM CHLORIDE 0.9 % IV SOLN
INTRAVENOUS | Status: DC | PRN
  Administered 2020-10-12 – 2020-10-13 (×2): 1000 mL via INTRAVENOUS

## 2020-10-12 NOTE — Progress Notes (Signed)
PROGRESS NOTE    Brad Frost  ACZ:[id number] DOB: 10-Apr-1956 DOA: 10/09/2020 PCP: [name], [name], MD    Brief Narrative:  65 y.o. male with medical history significant for type 2 diabetes mellitus, essential hypertension, peripheral vascular disease, nicotine dependence, chronic lymphedema and chronic alcohol use who was seen by the wound care specialist and referred to the emergency room on for gangrene involving the toes on his right foot and a large eschar over the dorsum of the right foot. He denies having any fever or chills, no chest pain, no shortness of breath, no dizziness, no lightheadedness, no abdominal pain, no nausea, no vomiting, no palpitations, no diaphoresis, no cough, no urinary symptoms, no changes in his bowel habits.  Started on Ancef for suspected overlying cellulitis and seen in consultation by podiatry and vascular surgery.  Per podiatry no surgical intervention until vascular involvement to evaluate vasculature.  Per vascular surgery note this is a critical limb threatening situation.  Most likely outcome would be amputation.  Patient will need angiography to assess vasculature to the area determine the next best course of action.  Status post angiography on 6/17.  Vasculature to right lower extremity is severely diseased.  At this point vascular surgery recommending right AKA.  Patient struggling with this decision.  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 He is afebrile and has no leukocytosis -Seen in consultation by vascular and podiatry -Per vascular surgery this is a limb threatening situation and most likely outcome will be amputation -Needs angiography to assess vasculature have any chance of the limb salvage -Angiography 6/17: Extensive tissue loss and severe malperfusion.  Right AKA recommended Plan: Vascular surgery to follow-up tomorrow morning and discussed with patient further regarding recommendation for right AKA     Peripheral vascular disease Continue aspirin, high intensity statins and beta-blockers Therapy evaluations when able       Anemia of chronic disease H&H is stable     Nicotine dependence Patient states that he has cut back on the number of cigarettes that he smokes since he went to the rest home He declines a nicotine transdermal patch at this time    DVT prophylaxis: SQ Lovenox Code Status: Full Family Communication: None today Disposition Plan: Status is: Inpatient  Remains inpatient appropriate because:Inpatient level of care appropriate due to severity of illness  Dispo: The patient is from: SNF              Anticipated d/c is to: SNF              Patient currently is not medically stable to d/c.   Difficult to place patient No  Patient has severe lower extremity vascular disease.  Too much non viable tissue.  Vascular surgery recommending right AKA.  Patient still undecided.     Level of care: Med-Surg  Consultants:  Vascular surgery Podiatry  Procedures:  Lower extremity angiography 6/17  Antimicrobials:  Ancef   Subjective: Patient seen and examined.  Sitting in chair.  No apparent distress  Objective: Vitals:   10/11/20 1531 10/11/20 2254 10/12/20 0650 10/12/20 0925  BP: 101/68 110/77 120/78 124/70  Pulse: 86 95 92 89  Resp: 14 16 18 18   Temp: 97.6 F (36.4 C)  98.5 F (36.9 C) 98.5 F (36.9 C) 99.1 F (37.3 C)  TempSrc: Oral Oral Oral Oral  SpO2: 100% 99% 100% 99%  Weight:      Height:        Intake/Output Summary (Last 24 hours) at 10/12/2020 1045 Last data filed at 10/12/2020 0900 Gross per 24 hour  Intake 240 ml  Output --  Net 240 ml   Filed Weights   10/09/20 1748  Weight: 76.7 kg    Examination:  General exam: Appears calm and comfortable  Respiratory system: Clear to auscultation. Respiratory effort normal. Cardiovascular system: S1 & S2 heard, RRR. No JVD, murmurs, rubs, gallops or clicks. No pedal edema. Gastrointestinal system: Abdomen is nondistended, soft and nontender. No organomegaly or masses felt. Normal bowel sounds heard. Central nervous system: Alert and oriented. No focal neurological deficits. Extremities: Extensive bilateral lower extremity swelling with multiple necrotic wounds and black eschar formation.  Images and chart Skin: No rashes, lesions or ulcers Psychiatry: Judgement and insight appear normal. Mood & affect appropriate.     Data Reviewed: I have personally reviewed following labs and imaging studies  CBC: Recent Labs  Lab 10/09/20 1750 10/10/20 0618  WBC 8.9 9.0  NEUTROABS 5.5  --   HGB 8.7* 7.8*  HCT 29.3* 26.1*  MCV 80.7 79.8*  PLT 582* 437*   Basic Metabolic Panel: Recent Labs  Lab 10/09/20 1750 10/10/20 0618  NA 135 140  K 4.0 3.6  CL 104 109  CO2 23 25  GLUCOSE 103* 86  BUN 6* 6*  CREATININE 0.82 0.75  CALCIUM 8.3* 8.1*   GFR: Estimated Creatinine Clearance: 92.5 mL/min (by C-G formula based on SCr of 0.75 mg/dL). Liver Function Tests: Recent Labs  Lab 10/09/20 1750  AST 15  ALT 11  ALKPHOS 73  BILITOT 0.5  PROT 7.4  ALBUMIN 2.4*   No results for input(s): LIPASE, AMYLASE in the last 168 hours. No results for input(s): AMMONIA in the last 168 hours. Coagulation Profile: Recent Labs  Lab 10/10/20 0621  INR 1.2   Cardiac Enzymes: No results for  input(s): CKTOTAL, CKMB, CKMBINDEX, TROPONINI in the last 168 hours. BNP (last 3 results) No results for input(s): PROBNP in the last 8760 hours. HbA1C: No results for input(s): HGBA1C in the last 72 hours. CBG: Recent Labs  Lab 10/09/20 1752  GLUCAP 84   Lipid Profile: No results for input(s): CHOL, HDL, LDLCALC, TRIG, CHOLHDL, LDLDIRECT in the last 72 hours. Thyroid Function Tests: No results for input(s): TSH, T4TOTAL, FREET4, T3FREE, THYROIDAB in the last 72 hours. Anemia Panel: No results for input(s): VITAMINB12, FOLATE, FERRITIN, TIBC, IRON, RETICCTPCT in the last 72 hours. Sepsis Labs: Recent Labs  Lab 10/09/20 1950 10/10/20 10/12/20  LATICACIDVEN 1.8 1.1    Recent Results (from the past 240 hour(s))  Resp Panel by RT-PCR (Flu A&B, Covid) Nasopharyngeal Swab     Status: None   Collection Time: 10/09/20  6:35 PM   Specimen: Nasopharyngeal Swab; Nasopharyngeal(NP) swabs in vial transport medium  Result Value Ref Range Status   SARS Coronavirus 2 by RT PCR NEGATIVE NEGATIVE Final  Comment: (NOTE) SARS-CoV-2 target nucleic acids are NOT DETECTED.  The SARS-CoV-2 RNA is generally detectable in upper respiratory specimens during the acute phase of infection. The lowest concentration of SARS-CoV-2 viral copies this assay can detect is 138 copies/mL. A negative result does not preclude SARS-Cov-2 infection and should not be used as the sole basis for treatment or other patient management decisions. A negative result may occur with  improper specimen collection/handling, submission of specimen other than nasopharyngeal swab, presence of viral mutation(s) within the areas targeted by this assay, and inadequate number of viral copies(<138 copies/mL). A negative result must be combined with clinical observations, patient history, and epidemiological information. The expected result is Negative.  Fact Sheet for Patients:  BloggerCourse.comhttps://www.fda.gov/media/152166/download  Fact  Sheet for Healthcare Providers:  SeriousBroker.ithttps://www.fda.gov/media/152162/download  This test is no t yet approved or cleared by the [place] FDA and  has been authorized for detection and/or diagnosis of SARS-CoV-2 by FDA under an Emergency Use Authorization (EUA). This EUA will remain  in effect (meaning this test can be used) for the duration of the COVID-19 declaration under Section 564(b)(1) of the Act, 21 U.S.C.section 360bbb-3(b)(1), unless the authorization is terminated  or revoked sooner.       Influenza A by PCR NEGATIVE NEGATIVE Final   Influenza B by PCR NEGATIVE NEGATIVE Final    Comment: (NOTE) The Xpert Xpress SARS-CoV-2/FLU/RSV plus assay is intended as an aid in the diagnosis of influenza from Nasopharyngeal swab specimens and should not be used as a sole basis for treatment. Nasal washings and aspirates are unacceptable for Xpert Xpress SARS-CoV-2/FLU/RSV testing.  Fact Sheet for Patients: BloggerCourse.comhttps://www.fda.gov/media/152166/download  Fact Sheet for Healthcare Providers: SeriousBroker.ithttps://www.fda.gov/media/152162/download  This test is not yet approved or cleared by the [place] FDA and has been authorized for detection and/or diagnosis of SARS-CoV-2 by FDA under an Emergency Use Authorization (EUA). This EUA will remain in effect (meaning this test can be used) for the duration of the COVID-19 declaration under Section 564(b)(1) of the Act, 21 U.S.C. section 360bbb-3(b)(1), unless the authorization is terminated or revoked.  Performed at [facility], [address] [place], [place] [address]   Culture, blood (Routine X 2) w Reflex to ID Panel     Status: None (Preliminary result)   Collection Time: 10/10/20  6:16 AM   Specimen: BLOOD  Result Value Ref Range Status   Specimen Description BLOOD LEFT ANTECUBITAL  Final   Special Requests   Final    BOTTLES DRAWN AEROBIC AND ANAEROBIC Blood Culture adequate volume   Culture   Final    NO GROWTH 2  DAYS Performed at [facility], [address] [place], [place] [address]    Report Status PENDING  Incomplete  Culture, blood (Routine X 2) w Reflex to ID Panel     Status: None (Preliminary result)   Collection Time: 10/10/20  6:21 AM   Specimen: BLOOD  Result Value Ref Range Status   Specimen Description BLOOD BLOOD LEFT HAND  Final   Special Requests   Final    BOTTLES DRAWN AEROBIC AND ANAEROBIC Blood Culture adequate volume   Culture   Final    NO GROWTH 2 DAYS Performed at [facility], [address] [place], [place] [address]    Report Status PENDING  Incomplete         Radiology Studies: PERIPHERAL VASCULAR CATHETERIZATION  Result Date: 10/10/2020 See surgical note for result.       Scheduled Meds:  aspirin  EC  81 mg Oral Daily   atorvastatin  80 mg Oral Daily   enoxaparin (LOVENOX) injection  40 mg Subcutaneous Q24H   metoprolol succinate  25 mg Oral Daily   tamsulosin  0.4 mg Oral Daily   Continuous Infusions:   ceFAZolin (ANCEF) IV 1 g (10/12/20 0548)     LOS: 3 days    Time spent: 15 minutes    Tresa Moore, MD [facility] Pager [phone]  If 7PM-7AM, please contact night-coverage 10/12/2020, 10:45 AM

## 2020-10-12 NOTE — Evaluation (Signed)
Occupational Therapy Evaluation Patient Details Name: Brad Frost MRN: [id number] DOB: 1956-04-18 Today's Date: 10/12/2020    History of Present Illness Brad Frost is a 65 y.o. 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   Clinical Impression   Brad Frost presents today with generalized weakness, reduced endurance, impaired balance, very limited functional mobility, limited ROM, and 7/10 pain. Pt able to provide little information regarding his prior living situation, alternately stating he has been at a SNF or a "rest home." Secondary to pain, edema, and balance, pt's OOB mobility is limited, however, he requests assistance for seated grooming tasks, which he is able to complete himself following setup and verbal cues. Pt vacillates between flat affect and anger, several times lashing out verbally at therapist. Pt will benefit from ongoing OT while hospitalized, if he is willing to accept it (made repeated statements that he was "tired of people coming in my room and bothering me.") Anticipate DC to SNF. Pt states he does not want to go back to the facility he came from.    Follow Up Recommendations  SNF    Equipment Recommendations  Other (comment) (defer to next venue of care)    Recommendations for Other Services Other (comment) (psych consult?)     Precautions / Restrictions Precautions Precautions: Fall Restrictions Weight Bearing Restrictions: No      Mobility Bed Mobility Overal bed mobility: Needs Assistance              General bed mobility comments: NT, pt found in recliner    Transfers Overall transfer level: Needs assistance               General transfer comment: Pt refused, 2/2 pain. Also states he wants his WC, will not mobilize w/o it.    Balance Overall balance assessment: Needs assistance Sitting-balance support: Feet unsupported;Single extremity supported Sitting balance-Leahy Scale: Good         Standing balance comment: Pt states he has not attempted standing for several weeks                           ADL either performed or assessed with clinical judgement   ADL Overall ADL's : Needs assistance/impaired     Grooming: Moderate Independence;Wash/dry face;Wash/dry hands;Sitting               Lower Body Dressing: Total assistance                       Vision Patient Visual Report: No change from baseline       Perception     Praxis      Pertinent Vitals/Pain Pain Score: 7  Pain Location: B feet Pain Intervention(s): Limited activity within patient's tolerance;Monitored during session     Hand Dominance     Extremity/Trunk Assessment Upper Extremity Assessment Upper Extremity Assessment: Generalized weakness   Lower Extremity Assessment Lower Extremity Assessment: Generalized weakness;LLE deficits/detail;RLE deficits/detail RLE: Unable to fully assess due to pain RLE Sensation: history of peripheral neuropathy;decreased light touch LLE:  Unable to fully assess due to pain LLE Sensation: history of peripheral neuropathy;decreased light touch       Communication Communication Communication: No difficulties   Cognition Arousal/Alertness: Awake/alert Behavior During Therapy: Anxious;Agitated;Flat affect Overall Cognitive Status: No family/caregiver present to determine baseline cognitive functioning                                 General Comments: labile, with flashes of angry. Oriented to self, location, date, put  seems somewhat confused to situation and prior living situation, PLOF   General Comments  b/l LE with with edema, ulcerations, oozing, discolored, scaly skin    Exercises Other Exercises Other Exercises: Educ re: importance of OOB mobility, medication mgmt, falls prevention   Shoulder Instructions      Home Living Family/patient expects to be discharged to:: Other (Comment) (Pt states he has been living at a "rest home," but is unable to provide additional description or to recall name of the facility.) Living Arrangements: Alone Available Help at Discharge: Skilled Nursing Facility Type of Home: Skilled Nursing Facility Home Access: Level entry     Home Layout: One level               Home Equipment: Krystal Clark - 2 wheels   Additional Comments: Pt unable to provide clear, consistent history      Prior Functioning/Environment Level of Independence: Needs assistance  Gait / Transfers Assistance Needed: WC ADL's / Homemaking Assistance Needed: pt reports he receives assistance with bathing, toileting, dressing and IADLs            OT Problem List: Decreased strength;Increased edema;Impaired tone;Decreased range of motion;Decreased coordination;Decreased knowledge of use of DME or AE;Decreased activity tolerance;Decreased cognition;Impaired balance (sitting and/or standing);Pain;Impaired sensation      OT Treatment/Interventions: Self-care/ADL training;Patient/family education;Therapeutic exercise;Balance training;Energy conservation;DME and/or AE instruction;Cognitive remediation/compensation;Therapeutic activities    OT Goals(Current goals can be found in the care plan section) Acute Rehab OT Goals Patient Stated Goal: to keep foot OT Goal Formulation: With patient Time For Goal Achievement: 10/26/20 Potential to Achieve Goals: Poor ADL Goals Pt Will Perform Lower Body Bathing: with min assist;sitting/lateral leans Pt Will Perform Lower Body Dressing: with  mod assist;sitting/lateral leans Pt Will Transfer to Toilet: with min assist;grab bars;stand pivot transfer  OT Frequency: Min 1X/week   Barriers to D/C: Inaccessible home environment;Decreased caregiver support          Co-evaluation              AM-PAC OT "6 Clicks" Daily Activity     Outcome Measure Help from another person eating meals?: None Help from another person taking care of personal grooming?: A Little Help from another person toileting, which includes using toliet, bedpan, or urinal?: A Lot Help from another person bathing (including washing, rinsing, drying)?: A Lot Help from another person to put on and taking off regular upper body clothing?: A Little Help from another person to put on and taking off regular lower body clothing?: A Lot 6 Click Score: 16   End of Session    Activity Tolerance: Treatment limited secondary to agitation Patient left: in chair;with call bell/phone within reach;with chair alarm set  OT Visit Diagnosis: Unsteadiness on feet (R26.81);Other abnormalities of gait and mobility (R26.89);Muscle weakness (generalized) (M62.81);Other symptoms and signs involving cognitive function                Time: 8786-7672 OT Time  Calculation (min): 22 min Charges:  OT General Charges $OT Visit: 1 Visit OT Evaluation $OT Eval Moderate Complexity: 1 Mod OT Treatments $Self Care/Home Management : 8-22 mins Latina Craver, PhD, MS, OTR/L 10/12/20, 3:54 PM

## 2020-10-12 NOTE — Progress Notes (Signed)
      Daily Progress Note   Assessment/Planning:   POD #2 s/p RRo  Pt still hasn't decided on R AKA Dr. Wyn Quaker and Lorretta Harp will be back tomorrow   Subjective  - 2 Days Post-Op   No events overnight, still thinking over AKA   Objective   Vitals:   10/11/20 1118 10/11/20 1531 10/11/20 2254 10/12/20 0650  BP: 91/67 101/68 110/77 120/78  Pulse: 97 86 95 92  Resp: 18 14 16 18   Temp: 97.7 F (36.5 C) 97.6 F (36.4 C) 98.5 F (36.9 C) 98.5 F (36.9 C)  TempSrc: Oral Oral Oral Oral  SpO2: 99% 100% 99% 100%  Weight:      Height:         Intake/Output Summary (Last 24 hours) at 10/12/2020 0905 Last data filed at 10/11/2020 1700 Gross per 24 hour  Intake 240 ml  Output --  Net 240 ml    VASC R foot: dry gangrene on dorsal mid-foot, L groin: no hematoma or thrill    Laboratory   CBC CBC Latest Ref Rng & Units 10/10/2020 10/09/2020 09/23/2020  WBC 4.0 - 10.5 K/uL 9.0 8.9 10.0  Hemoglobin 13.0 - 17.0 g/dL 7.8(L) 8.7(L) 7.9(L)  Hematocrit 39.0 - 52.0 % 26.1(L) 29.3(L) 25.0(L)  Platelets 150 - 400 K/uL 437(H) 582(H) 357    BMET    Component Value Date/Time   NA 140 10/10/2020 0618   K 3.6 10/10/2020 0618   CL 109 10/10/2020 0618   CO2 25 10/10/2020 0618   GLUCOSE 86 10/10/2020 0618   BUN 6 (L) 10/10/2020 0618   CREATININE 0.75 10/10/2020 0618   CALCIUM 8.1 (L) 10/10/2020 0618   GFRNONAA >60 10/10/2020 0618   GFRAA >60 04/14/2019 1345     04/16/2019, MD, FACS, FSVS Covering for [facility]: [phone]  10/12/2020, 9:05 AM

## 2020-10-13 ENCOUNTER — Encounter: Payer: Self-pay | Admitting: Vascular Surgery

## 2020-10-13 DIAGNOSIS — I96 Gangrene, not elsewhere classified: Secondary | ICD-10-CM | POA: Diagnosis not present

## 2020-10-13 DIAGNOSIS — I739 Peripheral vascular disease, unspecified: Secondary | ICD-10-CM

## 2020-10-13 LAB — RESP PANEL BY RT-PCR (FLU A&B, COVID) ARPGX2
Influenza A by PCR: NEGATIVE
Influenza B by PCR: NEGATIVE
SARS Coronavirus 2 by RT PCR: NEGATIVE

## 2020-10-13 MED ORDER — ACETAMINOPHEN 325 MG PO TABS: 650.0000 mg | ORAL_TABLET | ORAL | Status: DC | PRN

## 2020-10-13 NOTE — Progress Notes (Addendum)
Just informed by NA that patient has not voided today. He is refusing to attempt to void now, states he "might go in about an hour." Informed patient that we would need to bladder scan him and potentially do a catheter depending on amount of urine present. Patient then yelling at me that we "aren't putting a catheter anywhere near me, I said I don't need to pee right now."  Dr. Georgeann Oppenheim notified via secure chat.  Return message from MD to leave patient alone, no need to scan or cath at this time.

## 2020-10-13 NOTE — Evaluation (Signed)
Physical Therapy Evaluation Patient Details Name: Brad Frost MRN: [id number] DOB: May 31, 1955 Today's Date: 10/13/2020   History of Present Illness  Brad Frost is a 65 y.o. 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  Hospital course significant for R LE angiogram significant for severe vasculas disease; ultimately recommended for AKA.  Patient currently refusing AKA at this time.  Clinical Impression  Patient sleeping in bed upon arrival; awakens to voice/light touch.  Oriented to self, location and situation; follows commands and agreeable to limited participation with session with mod encouragement from therapist.  Bilat LEs noted with dry, flaking skin; R LE with noted wounds (bandaged, covered throughout session).  Currently requiring sup for bed mobility; min assist for sit/stand with RW from elevated surface; cga/close sup for basic transfers and gait (30') with RW.  Demonstrates 3-point, step to gait pattern, leading with R LE (self-selected).  Forward flexed posture, mod WBing bilat LEs.  Slow and deliberate in LE advancement, but no buckling or LOB. Minimal reports of pain with WBing activities; distance limited out of precaution. Would benefit from skilled PT to address above deficits and promote optimal return to PLOF.; recommend transition to STR upon discharge from acute hospitalization.     Follow Up Recommendations SNF    Equipment Recommendations       Recommendations for Other Services       Precautions / Restrictions  Precautions Precautions: None Restrictions RLE Weight Bearing: Weight bearing as tolerated LLE Weight Bearing: Weight bearing as tolerated Other Position/Activity Restrictions: Bilateral post op shoes donned with WB activity      Mobility  Bed Mobility Overal bed mobility: Needs Assistance Bed Mobility: Supine to Sit     Supine to sit: Supervision          Transfers Overall transfer level: Needs assistance Equipment used: Rolling walker (2 wheeled) Transfers: Sit to/from Stand Sit to Stand: Min assist;From elevated surface         General transfer comment: heavy use of bilat UEs to complete movement transition  Ambulation/Gait Ambulation/Gait assistance: Min guard;Supervision Gait Distance (Feet): 30 Feet Assistive device: Rolling walker (2 wheeled)       General Gait Details: 3-point, step to gait pattern, leading with R LE (self-selected).  Forward flexed posture, mod WBing bilat LEs.  Slow and deliberate in LE advancement, but no buckling or LOB. Minimal reports of pain with WBing activities; distance limited out of precaution.  Stairs            Wheelchair Mobility    Modified Rankin (Stroke Patients Only)       Balance Overall balance assessment: Needs assistance Sitting-balance support: No upper extremity supported;Feet supported Sitting balance-Leahy Scale: Good     Standing balance support: Bilateral upper extremity supported Standing balance-Leahy Scale: Fair                               Pertinent Vitals/Pain Pain Assessment: Faces Faces Pain Scale: Hurts little more Pain Location: R LE Pain Descriptors / Indicators: Sore Pain Intervention(s):  Limited activity within patient's tolerance;Monitored during session;Repositioned    Home Living Family/patient expects to be discharged to:: Private residence Living Arrangements: Alone   Type of Home: House Home Access: Stairs to enter Entrance Stairs-Rails: Right Entrance  Stairs-Number of Steps: 4 Home Layout: One level Home Equipment: Krystal Clark - 2 wheels Additional Comments: Recently at [facility] for rehab since recent hospitalization (08/2020); anticipates return at discharge.  Reports being unsure if prior living environment is "still available" for access at discharge    Prior Function Level of Independence: Independent         Comments: At baseline, indep without assist device; endorses recent use of RW and WC at facility due to worsening R foot/LE status.     Hand Dominance        Extremity/Trunk Assessment   Upper Extremity Assessment Upper Extremity Assessment: Overall WFL for tasks assessed    Lower Extremity Assessment Lower Extremity Assessment: Generalized weakness (bilat LEs with dry, flaking skin; R LE with wounds to distal LE/foot (covered during session).  Strength grossly 3+ to 4-/5 throughout; denies sensory deficit (though question full sensory awareness))       Communication      Cognition Arousal/Alertness: Awake/alert Behavior During Therapy: WFL for tasks assessed/performed Overall Cognitive Status: Within Functional Limits for tasks assessed                                 General Comments: Redirectable this afternoon with goal of returning to rehab at discharge; question full insight into medical situation/functional implications      General Comments      Exercises Other Exercises Other Exercises: Min assist to don bilat post-op shoes while seated edge of bed; fair dynamic sitting balance   Assessment/Plan    PT Assessment Patient needs continued PT services  PT Problem List Decreased strength;Decreased range of motion;Decreased activity tolerance;Decreased balance;Decreased mobility;Decreased knowledge of use of DME       PT Treatment Interventions DME instruction;Gait training;Stair training;Functional mobility training;Therapeutic activities;Therapeutic exercise;Balance  training;Patient/family education    PT Goals (Current goals can be found in the Care Plan section)  Acute Rehab PT Goals Patient Stated Goal: to get back to the place I was for rehab PT Goal Formulation: With patient Time For Goal Achievement: 10/27/20 Potential to Achieve Goals: Fair    Frequency Min 2X/week   Barriers to discharge        Co-evaluation               AM-PAC PT "6 Clicks" Mobility  Outcome Measure Help needed turning from your back to your side while in a flat bed without using bedrails?: None Help needed moving from lying on your back to sitting on the side of a flat bed without using bedrails?: None Help needed moving to and from a bed to a chair (including a wheelchair)?: A Little Help needed standing up from a chair using your arms (e.g., wheelchair or bedside chair)?: A Little Help needed to walk in hospital room?: A Little Help needed climbing 3-5 steps with a railing? : A Little 6 Click Score: 20    End of Session   Activity Tolerance: Patient tolerated treatment well Patient left: in chair;with call bell/phone within reach (Fall risk score 9; no alarms required) Nurse Communication: Mobility status PT Visit Diagnosis: Unsteadiness on feet (R26.81);Difficulty in walking, not elsewhere classified (R26.2);Muscle weakness (generalized) (M62.81)    Time: 9629-5284  PT Time Calculation (min) (ACUTE ONLY): 14 min   Charges:   PT Evaluation $PT Eval Moderate Complexity: 1 Mod        Bluma Buresh H. Manson Passey, PT, DPT, NCS 10/13/20, 4:35 PM [phone]

## 2020-10-13 NOTE — Progress Notes (Signed)
PROGRESS NOTE    Brad Frost  QJF:[id number] DOB: 1955-11-26 DOA: 10/09/2020 PCP: [name], [name], MD    Brief Narrative:  65 y.o. male with medical history significant for type 2 diabetes mellitus, essential hypertension, peripheral vascular disease, nicotine dependence, chronic lymphedema and chronic alcohol use who was seen by the wound care specialist and referred to the emergency room on for gangrene involving the toes on his right foot and a large eschar over the dorsum of the right foot. He denies having any fever or chills, no chest pain, no shortness of breath, no dizziness, no lightheadedness, no abdominal pain, no nausea, no vomiting, no palpitations, no diaphoresis, no cough, no urinary symptoms, no changes in his bowel habits.  Started on Ancef for suspected overlying cellulitis and seen in consultation by podiatry and vascular surgery.  Per podiatry no surgical intervention until vascular involvement to evaluate vasculature.  Per vascular surgery note this is a critical limb threatening situation.  Most likely outcome would be amputation.  Patient will need angiography to assess vasculature to the area determine the next best course of action.  Status post angiography on 6/17.  Vasculature to right lower extremity is severely diseased.  At this point vascular surgery recommending right AKA.  Patient struggling with this decision.  Wants to discuss with Dr. 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 He is afebrile and has no leukocytosis -Seen in consultation by vascular and podiatry -Per vascular surgery this is a limb threatening situation and most likely outcome will be amputation -Needs angiography to assess vasculature have any chance of the limb salvage -Angiography 6/17: Extensive tissue loss and severe malperfusion.  Right AKA recommended Plan: Pending vascular surgery follow-up to further discussed with patient their recommendation for AKA.  Patient states that he is amenable to the procedure.     Peripheral vascular disease Continue aspirin, high intensity statins and beta-blockers Therapy evaluations when able       Anemia of chronic disease H&H is stable     Nicotine dependence Patient states that he has cut back on the number of cigarettes that he smokes since he went to the rest home He declines a nicotine transdermal patch at this time    DVT prophylaxis: SQ Lovenox Code Status: Full Family Communication: None today Disposition Plan: Status is: Inpatient  Remains inpatient appropriate because:Inpatient level of care appropriate due to severity of illness  Dispo: The patient is from: SNF              Anticipated d/c is to: SNF              Patient currently is not medically stable to d/c.   Difficult to place patient No  Patient with significant lower extremity vascular disease.  Recommending AKA.  Pending follow-up with vascular surgery and further discussion regarding this procedure.     Level of care: Med-Surg  Consultants:  Vascular surgery Podiatry  Procedures:  Lower extremity angiography 6/17  Antimicrobials:  Ancef   Subjective: Patient seen and examined.  Sitting in chair.  No apparent distress  Objective: Vitals:   10/12/20 2011 10/12/20 2318 10/13/20 0321 10/13/20 0840  BP: 100/71 108/65 93/61 108/65  Pulse: 97 93 93  100  Resp: 16 16 16 20   Temp: 99.2 F (37.3 C) 98.8 F (37.1 C) 98.6 F (37 C) 98.6 F (37 C)  TempSrc: Oral Oral Oral   SpO2: 99% 99%  96%  Weight:      Height:        Intake/Output Summary (Last 24 hours) at 10/13/2020 1137 Last data filed at 10/13/2020 1026 Gross per 24 hour  Intake 692.61 ml  Output --  Net 692.61 ml   Filed Weights   10/09/20 1748  Weight: 76.7 kg    Examination:  General exam: Appears calm and comfortable  Respiratory system: Clear to auscultation. Respiratory effort normal. Cardiovascular system: S1 & S2 heard, RRR. No JVD, murmurs, rubs, gallops or clicks. No pedal edema. Gastrointestinal system: Abdomen is nondistended, soft and nontender. No organomegaly or masses felt. Normal bowel sounds heard. Central nervous system: Alert and oriented. No focal neurological deficits. Extremities: Extensive bilateral lower extremity swelling with multiple necrotic wounds and black eschar formation.  Images and chart Skin: No rashes, lesions or ulcers Psychiatry: Judgement and insight appear normal. Mood & affect appropriate.     Data Reviewed: I have personally reviewed following labs and imaging studies  CBC: Recent Labs  Lab 10/09/20 1750 10/10/20 0618  WBC 8.9 9.0  NEUTROABS 5.5  --   HGB 8.7* 7.8*  HCT 29.3* 26.1*  MCV 80.7 79.8*  PLT 582* 437*   Basic Metabolic Panel: Recent Labs  Lab 10/09/20 1750 10/10/20 0618  NA 135 140  K 4.0 3.6  CL 104 109  CO2 23 25  GLUCOSE 103* 86  BUN 6* 6*  CREATININE 0.82 0.75  CALCIUM 8.3* 8.1*   GFR: Estimated Creatinine Clearance: 92.5 mL/min (by C-G formula based on SCr of 0.75 mg/dL). Liver Function Tests: Recent Labs  Lab 10/09/20 1750  AST 15  ALT 11  ALKPHOS 73  BILITOT 0.5  PROT 7.4  ALBUMIN 2.4*   No results for input(s): LIPASE, AMYLASE in the last 168 hours. No results for input(s): AMMONIA in the last 168 hours. Coagulation Profile: Recent Labs  Lab 10/10/20 0621  INR 1.2    Cardiac Enzymes: No results for input(s): CKTOTAL, CKMB, CKMBINDEX, TROPONINI in the last 168 hours. BNP (last 3 results) No results for input(s): PROBNP in the last 8760 hours. HbA1C: No results for input(s): HGBA1C in the last 72 hours. CBG: Recent Labs  Lab 10/09/20 1752  GLUCAP 84   Lipid Profile: No results for input(s): CHOL, HDL, LDLCALC, TRIG, CHOLHDL, LDLDIRECT in the last 72 hours. Thyroid Function Tests: No results for input(s): TSH, T4TOTAL, FREET4, T3FREE, THYROIDAB in the last 72 hours. Anemia Panel: No results for input(s): VITAMINB12, FOLATE, FERRITIN, TIBC, IRON, RETICCTPCT in the last 72 hours. Sepsis Labs: Recent Labs  Lab 10/09/20 1950 10/10/20 10/12/20  LATICACIDVEN 1.8 1.1    Recent Results (from the past 240 hour(s))  Resp Panel by RT-PCR (Flu A&B, Covid) Nasopharyngeal Swab     Status: None   Collection Time: 10/09/20  6:35 PM   Specimen: Nasopharyngeal Swab; Nasopharyngeal(NP) swabs in vial transport medium  Result Value Ref Range Status   SARS Coronavirus 2 by RT PCR  NEGATIVE NEGATIVE Final    Comment: (NOTE) SARS-CoV-2 target nucleic acids are NOT DETECTED.  The SARS-CoV-2 RNA is generally detectable in upper respiratory specimens during the acute phase of infection. The lowest concentration of SARS-CoV-2 viral copies this assay can detect is 138 copies/mL. A negative result does not preclude SARS-Cov-2 infection and should not be used as the sole basis for treatment or other patient management decisions. A negative result may occur with  improper specimen collection/handling, submission of specimen other than nasopharyngeal swab, presence of viral mutation(s) within the areas targeted by this assay, and inadequate number of viral copies(<138 copies/mL). A negative result must be combined with clinical observations, patient history, and epidemiological information. The expected result is Negative.  Fact Sheet for Patients:   BloggerCourse.com  Fact Sheet for Healthcare Providers:  SeriousBroker.it  This test is no t yet approved or cleared by the [place] FDA and  has been authorized for detection and/or diagnosis of SARS-CoV-2 by FDA under an Emergency Use Authorization (EUA). This EUA will remain  in effect (meaning this test can be used) for the duration of the COVID-19 declaration under Section 564(b)(1) of the Act, 21 U.S.C.section 360bbb-3(b)(1), unless the authorization is terminated  or revoked sooner.       Influenza A by PCR NEGATIVE NEGATIVE Final   Influenza B by PCR NEGATIVE NEGATIVE Final    Comment: (NOTE) The Xpert Xpress SARS-CoV-2/FLU/RSV plus assay is intended as an aid in the diagnosis of influenza from Nasopharyngeal swab specimens and should not be used as a sole basis for treatment. Nasal washings and aspirates are unacceptable for Xpert Xpress SARS-CoV-2/FLU/RSV testing.  Fact Sheet for Patients: BloggerCourse.com  Fact Sheet for Healthcare Providers: SeriousBroker.it  This test is not yet approved or cleared by the [place] FDA and has been authorized for detection and/or diagnosis of SARS-CoV-2 by FDA under an Emergency Use Authorization (EUA). This EUA will remain in effect (meaning this test can be used) for the duration of the COVID-19 declaration under Section 564(b)(1) of the Act, 21 U.S.C. section 360bbb-3(b)(1), unless the authorization is terminated or revoked.  Performed at [facility], [address] [place], [place] [address]   Culture, blood (Routine X 2) w Reflex to ID Panel     Status: None (Preliminary result)   Collection Time: 10/10/20  6:16 AM   Specimen: BLOOD  Result Value Ref Range Status   Specimen Description BLOOD LEFT ANTECUBITAL  Final   Special Requests   Final    BOTTLES DRAWN AEROBIC AND ANAEROBIC Blood Culture  adequate volume   Culture   Final    NO GROWTH 2 DAYS Performed at [facility], [address] [place], [place] [address]    Report Status PENDING  Incomplete  Culture, blood (Routine X 2) w Reflex to ID Panel     Status: None (Preliminary result)   Collection Time: 10/10/20  6:21 AM   Specimen: BLOOD  Result Value Ref Range Status   Specimen Description BLOOD BLOOD LEFT HAND  Final   Special Requests   Final    BOTTLES DRAWN AEROBIC AND ANAEROBIC Blood Culture adequate volume   Culture   Final    NO GROWTH 2 DAYS Performed at [facility], [address], [place], [place] [address]    Report Status PENDING  Incomplete         Radiology Studies: No results found.      Scheduled Meds:  aspirin EC  81 mg  Oral Daily   atorvastatin  80 mg Oral Daily   enoxaparin (LOVENOX) injection  40 mg Subcutaneous Q24H   metoprolol succinate  25 mg Oral Daily   tamsulosin  0.4 mg Oral Daily   Continuous Infusions:  sodium chloride 1,000 mL (10/13/20 0518)    ceFAZolin (ANCEF) IV 1 g (10/13/20 0519)     LOS: 4 days    Time spent: 15 minutes    Tresa MooreSudheer B Luwanda Starr, MD [facility] Pager [phone]  If 7PM-7AM, please contact night-coverage 10/13/2020, 11:37 AM

## 2020-10-13 NOTE — Progress Notes (Signed)
Vein & Vascular Surgery Daily Progress Note   10/10/20:             1.  Ultrasound guidance for vascular access left femoral artery             2.  Catheter placement into right common femoral artery from left femoral approach             3.  Aortogram and selective right lower extremity angiogram             4.  StarClose closure device left femoral artery  Subjective: Patient without complaint this AM.  Patient states that he feels his right lower extremity has improved over the weekend.  Patient states that staff are able to find a pulse.  No acute issues overnight.  Objective: Vitals:   10/12/20 2011 10/12/20 2318 10/13/20 0321 10/13/20 0840  BP: 100/71 108/65 93/61 108/65  Pulse: 97 93 93 100  Resp: 16 16 16 20   Temp: 99.2 F (37.3 C) 98.8 F (37.1 C) 98.6 F (37 C) 98.6 F (37 C)  TempSrc: Oral Oral Oral   SpO2: 99% 99%  96%  Weight:      Height:        Intake/Output Summary (Last 24 hours) at 10/13/2020 1215 Last data filed at 10/13/2020 1026 Gross per 24 hour  Intake 692.61 ml  Output --  Net 692.61 ml   Physical Exam: A&Ox3, NAD CV: RRR Pulmonary: CTA Bilaterally Abdomen: Soft, Nontender, Nondistended Vascular:  Right lower extremity: Gangrenous changes to the right foot are stable.  Thigh soft.  Calf soft.  Unable to palpate pedal pulses.   Laboratory: CBC    Component Value Date/Time   WBC 9.0 10/10/2020 0618   HGB 7.8 (L) 10/10/2020 0618   HCT 26.1 (L) 10/10/2020 0618   PLT 437 (H) 10/10/2020 0618   BMET    Component Value Date/Time   NA 140 10/10/2020 0618   K 3.6 10/10/2020 0618   CL 109 10/10/2020 0618   CO2 25 10/10/2020 0618   GLUCOSE 86 10/10/2020 0618   BUN 6 (L) 10/10/2020 0618   CREATININE 0.75 10/10/2020 0618   CALCIUM 8.1 (L) 10/10/2020 0618   GFRNONAA >60 10/10/2020 0618   GFRAA >60 04/14/2019 1345   Assessment/Planning: The patient is a 65 year old male with multiple medical issues including severe atherosclerotic  disease to the right lower extremity and subsequent gangrene of the right foot.  1) s/p recent angiogram on October 10, 2020 - the patient was noted to have severe atherosclerotic disease with an occluded SFA, popliteal as well as tibial arteries.  There is essentially no flow to the right foot.  Due to severe disease we are recommending an above-the-knee amputation.  2) At this point, the patient continues to refuse amputation.  Patient states that his foot has gotten better over the weekend and that staff were able to palpate pulses. I am not able to palpate any pulses on physical exam. I had a long discussion with him in regard to the pathophysiology of severe atherosclerotic disease in the setting of gangrene with non-reconstructable disease. We discussed the gangrenous changes to the right foot would continue to worsen proximally and this could result in the need for emergent amputation, sepsis and even death.  We also discussed the option of seeking a second opinion with another vascular surgeon.  Again, at this time the patient is refusing an above-the-knee amputation.  Discussed with Dr. October 12, 2020 [facility] PA-C  10/13/2020 12:15 PM

## 2020-10-13 NOTE — TOC Progression Note (Signed)
Transition of Care [facility]) - Progression Note    Patient Details  Name: Brad Frost MRN: [id number] Date of Birth: [birth year]  [facility] CM/SW Contact  Margarito Liner, LCSW Phone Number: 10/13/2020, 3:15 PM  Clinical Narrative:   Per MD, patient is ready for discharge since he is refusing AKA. MD said patient told ID he was willing to return to [facility]. CSW called patient in the room and confirmed this. [facility] can accept patient back today if discharged and we get rapid COVID results. Last COVID test was 6/16. MD will order rapid test. He has traditional Medicare so he does not need a prior authorization. He was placed under inpatient status on 6/16.  Expected Discharge Plan: Skilled Nursing Facility Barriers to Discharge: Continued Medical Work up  Expected Discharge Plan and Services Expected Discharge Plan: Skilled Nursing Facility       Living arrangements for the past 2 months: Skilled Nursing Facility                                       Social Determinants of Health (SDOH) Interventions    Readmission Risk Interventions No flowsheet data found.

## 2020-10-13 NOTE — Progress Notes (Signed)
PT Cancellation Note  Patient Details Name: Brad Frost MRN: [id number] DOB: [birth year]   Cancelled Treatment:    Reason Eval/Treat Not Completed: Patient declined, no reason specified (Evaluation re-attempted.  Patient very conversational and engaging with interview/discussion; however, when asked to initiate mobility, immediately and adamantly declined participation, slightly irritated "ya'll won't let me rest".  Voices RN recently gave medications and he wasn't willing to attempt mobility this AM, requesting therapist re-attempt in PM.  Unable to redirect or facilitate despite encouragement.  Will continue efforts as appropriate and available.  Per notes, in discussion with care team about care plan moving forward (vascular recommending AKA). )  Judianne Seiple H. Manson Passey, PT, DPT, NCS 10/13/20, 10:30 AM [phone]

## 2020-10-13 NOTE — Plan of Care (Signed)
  Problem: Education: Goal: Knowledge of General Education information will improve Description: Including pain rating scale, medication(s)/side effects and non-pharmacologic comfort measures Outcome: Progressing   Problem: Clinical Measurements: Goal: Ability to maintain clinical measurements within normal limits will improve Outcome: Progressing   Problem: Activity: Goal: Risk for activity intolerance will decrease Outcome: Progressing   Problem: Pain Managment: Goal: General experience of comfort will improve Outcome: Progressing   Problem: Safety: Goal: Ability to remain free from injury will improve Outcome: Progressing   Problem: Skin Integrity: Goal: Risk for impaired skin integrity will decrease Outcome: Not Progressing

## 2020-10-13 NOTE — Care Management Important Message (Signed)
Important Message  Patient Details  Name: HALVOR BEHREND MRN: [id number] Date of Birth: Oct 03, 1955   Medicare Important Message Given:  Other (see comment)  Explained Medicare IM. Patient declines to sign Medicare IM this admission.  Copy of form left in patient's room for reference.  No further attempts for signature at this time.     Johnell Comings 10/13/2020, 3:17 PM

## 2020-10-13 NOTE — Consult Note (Addendum)
NAME: Brad Frost  DOB: [birth year]  MRN: [id number]  Date/Time: 10/13/2020 12:29 PM  REQUESTING PROVIDER: Dr. Gerlene Fee Subjective:  REASON FOR CONSULT: Gangrene right foot ? Brad Frost is a 65 y.o. 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  Patient was being followed at the wound clinic and they had referred him to the ED.  He did not have any fever or chills or discharge from the foot.  Vitals in the ED BP of 105/79, heart rate 84, normal temperature and sats of 100%.  Labs were WBC of 8.9, Hb of 8.7 and platelet of 582.  Initially was started on IV antibiotics. He was seen by vascular and taken for angiogram on 10/10/2020.   This showed occlusion of the SFA.  Total occlusion of superior femoral artery and popliteal artery on the right side with faint reconstitution of a diseased peroneal artery and posterior tibial artery.  The posterior tibial artery occluded again after reconstitution above the ankle and did not fill the foot.  Vascular recommended AKA.  Patient has refused that.  He would like to preserve his life.  I am asked to see whether any antibiotic is needed. .  Past Medical History:  Diagnosis Date   Diabetes mellitus without complication (HCC)    Hypertension    Peripheral vascular disease [facility])     Past Surgical History:  Procedure Laterality Date   LEG SURGERY  August 20, 2014   Right Leg  BPG   LOWER EXTREMITY ANGIOGRAPHY Left 09/19/2020   Procedure: Lower Extremity Angiography;  Surgeon: Renford Dills, MD;  Location: [facility] INVASIVE CV LAB;  Service: Cardiovascular;  Laterality: Left;   LOWER EXTREMITY ANGIOGRAPHY Right 10/10/2020   Procedure: Lower Extremity Angiography;  Surgeon: Annice Needy, MD;  Location: [facility] INVASIVE CV LAB;  Service: Cardiovascular;  Laterality: Right;    Social History    Socioeconomic History   Marital status: Single    Spouse name: Not on file   Number of children: Not on file   Years of education: Not on file   Highest education level: Not on file  Occupational History   Not on file  Tobacco Use   Smoking status: Former    Pack years: 0.00    Types: Cigarettes   Smokeless tobacco: Never  Substance and Sexual Activity   Alcohol use: Yes   Drug use: No   Sexual activity: Not on file  Other Topics Concern   Not on file  Social History Narrative   Not on file   Social Determinants of Health   Financial Resource Strain: Not on file  Food Insecurity: Not on file  Transportation Needs: Not on file  Physical Activity: Not on file  Stress: Not on file  Social Connections: Not on file  Intimate Partner Violence: Not on file    Family History  Problem Relation Age of Onset   Hypertension Mother    No Known Allergies I? Current Facility-Administered Medications  Medication Dose Route Frequency Provider Last Rate Last Admin   0.9 %  sodium chloride infusion   Intravenous PRN Lolita Patella B, MD 10 mL/hr at 10/13/20 0518 1,000 mL at 10/13/20 0518   acetaminophen (TYLENOL) tablet 650 mg  650 mg Oral Q4H PRN Annice Needy, MD   650 mg at 10/12/20 1932   aspirin EC tablet 81 mg  81 mg Oral Daily Annice Needy, MD   81 mg at 10/13/20 0915   atorvastatin (LIPITOR) tablet 80 mg  80 mg Oral Daily Annice Needy, MD   80 mg at 10/13/20 0915   ceFAZolin (ANCEF) IVPB 1 g/50 mL premix  1 g Intravenous Q8H Annice Needy, MD 100 mL/hr at 10/13/20 0519 1 g at 10/13/20 0519   enoxaparin (LOVENOX) injection 40 mg  40 mg Subcutaneous Q24H Lolita Patella B, MD   40 mg at 10/12/20 2014   metoprolol succinate (TOPROL-XL) 24 hr tablet 25 mg  25 mg Oral Daily Annice Needy, MD   25 mg at 10/13/20 0915   morphine 2 MG/ML injection 2 mg  2 mg Intravenous Q4H PRN Annice Needy, MD   2 mg at 10/11/20 0309   ondansetron (ZOFRAN) tablet 4 mg  4 mg Oral Q6H PRN Annice Needy, MD       Or   ondansetron [facility]) injection 4 mg  4 mg Intravenous Q6H PRN Annice Needy, MD       tamsulosin [facility]) capsule 0.4 mg  0.4 mg Oral Daily Annice Needy, MD   0.4 mg at 10/13/20 0915     Abtx:  Anti-infectives (From admission, onward)    Start     Dose/Rate Route Frequency Ordered Stop   10/10/20 2200  ceFAZolin (ANCEF) IVPB 1 g/50 mL premix        1 g 100 mL/hr over 30 Minutes Intravenous Every 8 hours 10/09/20 2101 10/17/20 2159   10/10/20 1249  ceFAZolin (ANCEF) IVPB 2g/100 mL premix  Status:  Discontinued        2 g 200 mL/hr over 30 Minutes Intravenous 30 min pre-op 10/10/20 1249 10/10/20 1616   10/09/20 1915  piperacillin-tazobactam (ZOSYN) IVPB 3.375 g        3.375 g 100 mL/hr over 30 Minutes Intravenous  Once 10/09/20 1910 10/09/20 2149   10/09/20 1845  vancomycin (VANCOCIN) IVPB 1000 mg/200 mL premix        1,000 mg 200 mL/hr over 60 Minutes Intravenous  Once 10/09/20 1835 10/09/20 2211   10/09/20 1845  cefTRIAXone (ROCEPHIN) 2 g in sodium chloride 0.9 % 100 mL IVPB  Status:  Discontinued        2 g 200 mL/hr over 30 Minutes Intravenous  Once 10/09/20 1835 10/09/20 2107       REVIEW OF SYSTEMS:  Const: negative fever, negative chills, negative weight loss Eyes: negative diplopia or visual changes, negative eye pain ENT: negative coryza, negative sore throat Resp: negative cough, hemoptysis, dyspnea Cards: negative for chest pain, palpitations, lower extremity edema GU: negative for frequency, dysuria and hematuria GI: Negative for abdominal pain, diarrhea, bleeding, constipation Skin: negative for rash and pruritus Heme: negative for easy bruising and gum/nose bleeding MS: negative for myalgias, arthralgias, back pain and muscle weakness Neurolo:negative for headaches, dizziness, vertigo, memory problems  Psych: negative for feelings of anxiety, depression  Endocrine:  diabetes Allergy/Immunology- negative for any medication or food  allergies ? Pertinent Positives include : Objective:  VITALS:  BP 108/65 (BP Location: Left Arm)   Pulse 100   Temp 98.6 F (37 C)   Resp 20   Ht 5\' 10"  (1.778 m)   Wt 76.7 kg   SpO2 96%   BMI 24.25 kg/m  PHYSICAL EXAM:  General: Alert, cooperative, no distress, appears stated age.  Head: Normocephalic, without obvious abnormality, atraumatic. Eyes: Conjunctivae clear, anicteric sclerae. Pupils are  equal ENT Nares normal. No drainage or sinus tenderness. Lips, mucosa, and tongue normal. No Thrush Neck: Supple, symmetrical, no adenopathy, thyroid: non tender no carotid bruit and no JVD. Back: No CVA tenderness. Lungs: Clear to auscultation bilaterally. No Wheezing or Rhonchi. No rales. Heart: Regular rate and rhythm, no murmur, rub or gallop. Abdomen: Soft, non-tender,not distended. Bowel sounds normal. No masses Extremities: Both feet have scaling and darkness.    Gangrenous toes on the right foot especially the second and third. On the dorsum of the foot there is an area of eschar On the heel there is eschar No discharge or edema. Skin: No rashes or lesions. Or bruising  Neurologic: Grossly non-focal Pertinent Labs Lab Results CBC    Component Value Date/Time   WBC 9.0 10/10/2020 0618   RBC 3.27 (L) 10/10/2020 0618   HGB 7.8 (L) 10/10/2020 0618   HCT 26.1 (L) 10/10/2020 0618   PLT 437 (H) 10/10/2020 0618   MCV 79.8 (L) 10/10/2020 0618   MCH 23.9 (L) 10/10/2020 0618   MCHC 29.9 (L) 10/10/2020 0618   RDW 22.1 (H) 10/10/2020 0618   LYMPHSABS 1.6 10/09/2020 1750   MONOABS 0.9 10/09/2020 1750   EOSABS 0.9 (H) 10/09/2020 1750   BASOSABS 0.0 10/09/2020 1750    CMP Latest Ref Rng & Units 10/10/2020 10/09/2020 09/22/2020  Glucose 70 - 99 mg/dL 86 237(S) 82  BUN 8 - 23 mg/dL 6(L) 6(L) 8  Creatinine 0.61 - 1.24 mg/dL 2.83 1.51 7.61  Sodium 135 - 145 mmol/L 140 135 136  Potassium 3.5 - 5.1 mmol/L 3.6 4.0 4.3  Chloride 98 - 111 mmol/L 109 104 108  CO2 22 - 32 mmol/L  25 23 23   Calcium 8.9 - 10.3 mg/dL 8.1(L) 8.3(L) 7.9(L)  Total Protein 6.5 - 8.1 g/dL - 7.4 -  Total Bilirubin 0.3 - 1.2 mg/dL - 0.5 -  Alkaline Phos 38 - 126 U/L - 73 -  AST 15 - 41 U/L - 15 -  ALT 0 - 44 U/L - 11 -      Microbiology: Recent Results (from the past 240 hour(s))  Resp Panel by RT-PCR (Flu A&B, Covid) Nasopharyngeal Swab     Status: None   Collection Time: 10/09/20  6:35 PM   Specimen: Nasopharyngeal Swab; Nasopharyngeal(NP) swabs in vial transport medium  Result Value Ref Range Status   SARS Coronavirus 2 by RT PCR NEGATIVE NEGATIVE Final    Comment: (NOTE) SARS-CoV-2 target nucleic acids are NOT DETECTED.  The SARS-CoV-2 RNA is generally detectable in upper respiratory specimens during the acute phase of infection. The lowest concentration of SARS-CoV-2 viral copies this assay can detect is 138 copies/mL. A negative result does not preclude SARS-Cov-2 infection and should not be used as the sole basis for treatment or other patient management decisions. A negative result may occur with  improper specimen collection/handling, submission of specimen other than nasopharyngeal swab, presence of viral mutation(s) within the areas targeted by this assay, and inadequate number of viral copies(<138 copies/mL). A negative result must be combined with clinical observations, patient history, and epidemiological information. The expected result is Negative.  Fact Sheet for Patients:  10/11/20  Fact Sheet for Healthcare Providers:  BloggerCourse.com  This test is no t yet approved or cleared by the [place] FDA and  has been authorized for detection and/or diagnosis of SARS-CoV-2 by FDA under an Emergency Use Authorization (EUA). This EUA will remain  in effect (meaning this test can be used) for the duration  of the COVID-19 declaration under Section 564(b)(1) of the Act, 21 U.S.C.section 360bbb-3(b)(1),  unless the authorization is terminated  or revoked sooner.       Influenza A by PCR NEGATIVE NEGATIVE Final   Influenza B by PCR NEGATIVE NEGATIVE Final    Comment: (NOTE) The Xpert Xpress SARS-CoV-2/FLU/RSV plus assay is intended as an aid in the diagnosis of influenza from Nasopharyngeal swab specimens and should not be used as a sole basis for treatment. Nasal washings and aspirates are unacceptable for Xpert Xpress SARS-CoV-2/FLU/RSV testing.  Fact Sheet for Patients: BloggerCourse.comhttps://www.fda.gov/media/152166/download  Fact Sheet for Healthcare Providers: SeriousBroker.ithttps://www.fda.gov/media/152162/download  This test is not yet approved or cleared by the [place] FDA and has been authorized for detection and/or diagnosis of SARS-CoV-2 by FDA under an Emergency Use Authorization (EUA). This EUA will remain in effect (meaning this test can be used) for the duration of the COVID-19 declaration under Section 564(b)(1) of the Act, 21 U.S.C. section 360bbb-3(b)(1), unless the authorization is terminated or revoked.  Performed at [facility], [address] [place], [place] [address]   Culture, blood (Routine X 2) w Reflex to ID Panel     Status: None (Preliminary result)   Collection Time: 10/10/20  6:16 AM   Specimen: BLOOD  Result Value Ref Range Status   Specimen Description BLOOD LEFT ANTECUBITAL  Final   Special Requests   Final    BOTTLES DRAWN AEROBIC AND ANAEROBIC Blood Culture adequate volume   Culture   Final    NO GROWTH 2 DAYS Performed at [facility], [address] [place], [place] [address]    Report Status PENDING  Incomplete  Culture, blood (Routine X 2) w Reflex to ID Panel     Status: None (Preliminary result)   Collection Time: 10/10/20  6:21 AM   Specimen: BLOOD  Result Value Ref Range Status   Specimen Description BLOOD BLOOD LEFT HAND  Final   Special Requests   Final    BOTTLES DRAWN AEROBIC AND ANAEROBIC Blood Culture adequate volume    Culture   Final    NO GROWTH 2 DAYS Performed at [facility], [address] [place], [place] [address]    Report Status PENDING  Incomplete    IMAGING RESULTS: X-ray foot: No fracture or dislocation or osteomyelitis. I have personally reviewed the films ? Impression/Recommendation ? Dry gangrene of the right foot.  With severe peripheral artery disease.  There is no obvious infection currently.  As recommended by vascular patient needs AKA but he is not willing.  Explained to the patient that even though the foot is not currently infected he may develop wet gangrene and infection in the future.  Patient currently on cefazolin and does not need antibiotics.  He would [facility] get [facility].  Diabetes mellitus management as per primary team  Anemia.  Current smoker  Current alcohol use   ? ___________________________________________________ Discussed with patient, requesting provider Note:  This document was prepared using Dragon voice recognition software and may include unintentional dictation errors.

## 2020-10-13 NOTE — NC FL2 (Signed)
[place] MEDICAID FL2 LEVEL OF CARE SCREENING TOOL     IDENTIFICATION  Patient Name: Brad Frost Birthdate: [birth year] Sex: male Admission Date (Current Location): 10/09/2020  [facility] and [place] Number:  Chiropodist and Address:  [facility], [address] [place], [place] [address]      Provider Number: [id number]  Attending Physician Name and Address:  Tresa Moore, MD  Relative Name and Phone Number:       Current Level of Care: Hospital Recommended Level of Care: Skilled Nursing Facility Prior Approval Number:    Date Approved/Denied:   PASRR Number: [id number]  Discharge Plan: SNF    Current Diagnoses: Patient Active Problem List   Diagnosis Date Noted   Gangrene (HCC) 10/09/2020   Anemia of chronic disease 10/09/2020   Toe ulcer, right (HCC) 09/17/2020   Cellulitis of lower extremity 09/16/2020   COVID-19 04/13/2019   Hypotension    COVID-19 virus infection 04/04/2019   Pneumonia due to COVID-19 virus    Bilateral pulmonary embolism (HCC)    Lung mass    AKI (acute kidney injury) (HCC)    Type 2 diabetes mellitus with diabetic neuropathy, without long-term current use of insulin (HCC)    PVD (peripheral vascular disease) (HCC) 02/14/2015    Orientation RESPIRATION BLADDER Height & Weight     Self, Time, Situation, Place  Normal Continent Weight: 169 lb (76.7 kg) Height:  5\' 10"  (177.8 cm)  BEHAVIORAL SYMPTOMS/MOOD NEUROLOGICAL BOWEL NUTRITION STATUS   (None)   Continent Diet (Heart healthy)  AMBULATORY STATUS COMMUNICATION OF NEEDS Skin   Extensive Assist Verbally Other (Comment) (Blister, cellulitis, cracking. Wounds on R leg (gauze, kerlix). Punture on left proximal anterior thigh (gauze, transparent dressing). Blisters and epidermis missing on right and left pretibial. Epidermis missing on R anterior toes and L anterior toe.)                       Personal Care Assistance Level of Assistance   Bathing, Feeding, Dressing Bathing Assistance: Maximum assistance Feeding assistance: Limited assistance Dressing Assistance: Maximum assistance     Functional Limitations Info  Sight, Hearing, Speech Sight Info: Adequate Hearing Info: Adequate Speech Info: Adequate    SPECIAL CARE FACTORS FREQUENCY  PT (By licensed PT), OT (By licensed OT)     PT Frequency: 5 x week OT Frequency: 5 x week            Contractures Contractures Info: Not present    Additional Factors Info  Code Status, Allergies Code Status Info: Full code Allergies Info: NKDA           Current Medications (10/13/2020):  This is the current hospital active medication list Current Facility-Administered Medications  Medication Dose Route Frequency Provider Last Rate Last Admin   0.9 %  sodium chloride infusion   Intravenous PRN 10/15/2020, MD   Stopped at 10/13/20 0549   acetaminophen (TYLENOL) tablet 650 mg  650 mg Oral Q4H PRN 10/15/20 B, MD       aspirin EC tablet 81 mg  81 mg Oral Daily Lolita Patella, MD   81 mg at 10/13/20 0915   atorvastatin (LIPITOR) tablet 80 mg  80 mg Oral Daily 10/15/20, MD   80 mg at 10/13/20 0915   ceFAZolin (ANCEF) IVPB 1 g/50 mL premix  1 g Intravenous Q8H 10/15/20, MD 100 mL/hr at 10/13/20 1408 1 g at 10/13/20 1408  enoxaparin (LOVENOX) injection 40 mg  40 mg Subcutaneous Q24H Georgeann Oppenheim, Sudheer B, MD   40 mg at 10/12/20 2014   metoprolol succinate (TOPROL-XL) 24 hr tablet 25 mg  25 mg Oral Daily Annice Needy, MD   25 mg at 10/13/20 0915   morphine 2 MG/ML injection 2 mg  2 mg Intravenous Q4H PRN Annice Needy, MD   2 mg at 10/11/20 0309   ondansetron (ZOFRAN) tablet 4 mg  4 mg Oral Q6H PRN Annice Needy, MD       Or   ondansetron [facility]) injection 4 mg  4 mg Intravenous Q6H PRN Annice Needy, MD       tamsulosin (FLOMAX) capsule 0.4 mg  0.4 mg Oral Daily Annice Needy, MD   0.4 mg at 10/13/20 0915     Discharge Medications: Please see discharge  summary for a list of discharge medications.  Relevant Imaging Results:  Relevant Lab Results:   Additional Information SS#: [id number]  Margarito Liner, LCSW

## 2020-10-14 DIAGNOSIS — I96 Gangrene, not elsewhere classified: Secondary | ICD-10-CM | POA: Diagnosis not present

## 2020-10-14 MED ORDER — ATORVASTATIN CALCIUM 20 MG PO TABS: 80.0000 mg | ORAL_TABLET | Freq: Every day | ORAL | Status: DC

## 2020-10-14 MED ORDER — OXYCODONE HCL 5 MG PO TABS: 5.0000 mg | ORAL_TABLET | Freq: Three times a day (TID) | ORAL | 0 refills | Status: AC | PRN

## 2020-10-14 MED ORDER — RIVAROXABAN 20 MG PO TABS
20.0000 mg | ORAL_TABLET | Freq: Every day | ORAL | Status: DC
Start: 2020-10-14 — End: 2021-01-19

## 2020-10-14 NOTE — Progress Notes (Signed)
[facility] Daily Progress Note  10/10/20:             1.  Ultrasound guidance for vascular access left femoral artery             2.  Catheter placement into right common femoral artery from left femoral approach             3.  Aortogram and selective right lower extremity angiogram             4.  StarClose closure device left femoral artery  Subjective: Patient is resting comfortably in bed this AM.  No acute issues overnight.  Patient is without complaint this AM.  Objective: Vitals:   10/13/20 2314 10/14/20 0622 10/14/20 0750 10/14/20 1038  BP: 115/72 102/62 104/67 113/70  Pulse: 92 92 82 84  Resp: 16 18 19    Temp: 98.4 F (36.9 C) 99.1 F (37.3 C) 99 F (37.2 C)   TempSrc: Oral Oral Oral   SpO2: 100% 96% 98%   Weight:      Height:        Intake/Output Summary (Last 24 hours) at 10/14/2020 1103 Last data filed at 10/14/2020 0900 Gross per 24 hour  Intake 360 ml  Output 800 ml  Net -440 ml   Physical Exam: A&Ox3, NAD CV: RRR Pulmonary: CTA Bilaterally Abdomen: Soft, Nontender, Nondistended Vascular:             Right lower extremity: Gangrenous changes to the right foot are stable.  Thigh soft.  Calf soft.  Unable to palpate pedal pulses.   Laboratory: CBC    Component Value Date/Time   WBC 9.0 10/10/2020 0618   HGB 7.8 (L) 10/10/2020 0618   HCT 26.1 (L) 10/10/2020 0618   PLT 437 (H) 10/10/2020 0618   BMET    Component Value Date/Time   NA 140 10/10/2020 0618   K 3.6 10/10/2020 0618   CL 109 10/10/2020 0618   CO2 25 10/10/2020 0618   GLUCOSE 86 10/10/2020 0618   BUN 6 (L) 10/10/2020 0618   CREATININE 0.75 10/10/2020 0618   CALCIUM 8.1 (L) 10/10/2020 0618   GFRNONAA >60 10/10/2020 0618   GFRAA >60 04/14/2019 1345   Assessment/Planning: The patient is a 65 year old male with multiple medical issues including severe atherosclerotic disease to the right lower extremity and subsequent gangrene of the right foot.   1) s/p recent  angiogram on October 10, 2020 - the patient was noted to have severe atherosclerotic disease with an occluded SFA, popliteal as well as tibial arteries.  There is essentially no flow to the right foot.  Due to severe disease we are recommending an above-the-knee amputation.   2) patient continues to refuse amputation at this time.  He is fully aware of the risks including need for emergency surgery, sepsis and even death.  Patient notes that he will seek a second opinion.  At this point vascular surgery will sign off.  If the patient changes his We are happy to reconsult.  I also explained to the patient that our contact information would be in his discharge paperwork.  He is welcome to call our office if he would like to proceed.  Discussed with Dr. October 12, 2020 Renessa Wellnitz PA-C 10/14/2020 11:03 AM

## 2020-10-14 NOTE — Progress Notes (Signed)
Report given to Morrie Sheldon, RN at [facility] & is aware of the patient declining right AKA.

## 2020-10-14 NOTE — TOC Transition Note (Signed)
Transition of Care [facility]) - CM/SW Discharge Note   Patient Details  Name: Brad Frost MRN: [id number] Date of Birth: [birth year]  [facility] CM/SW Contact:  Margarito Liner, LCSW Phone Number: 10/14/2020, 12:12 PM   Clinical Narrative:   Patient has orders to discharge to [facility] SNF today. RN will call report to [phone] (Room 25A). EMS transport set up for 2:30. No further concerns. CSW signing off.  Final next level of care: Skilled Nursing Facility Barriers to Discharge: Barriers Resolved   Patient Goals and CMS Choice     Choice offered to / list presented to : Patient  Discharge Placement   Existing PASRR number confirmed : 10/13/20          Patient chooses bed at: [facility] Patient to be transferred to facility by: EMS Name of family member notified: Patient said he would notify his family when he returned to the facility. Patient and family notified of of transfer: 10/14/20  Discharge Plan and Services                                     Social Determinants of Health (SDOH) Interventions     Readmission Risk Interventions No flowsheet data found.

## 2020-10-14 NOTE — Progress Notes (Signed)
Updated patient's family regarding transfer

## 2020-10-14 NOTE — Discharge Summary (Signed)
Physician Discharge Summary  Brad Frost:[id number] DOB: 07-Sep-1955 DOA: 10/09/2020  PCP: [name], [name], MD  Admit date: 10/09/2020 Discharge date: 10/14/2020  Admitted From: SNF Disposition:  SNF  Recommendations for Outpatient Follow-up:  Follow up with PCP in 1-2 weeks Follow up with vascular surgery at [facility] or [facility] ASAP  Home Health:No Equipment/Devices:None  Discharge Condition:Stable CODE STATUS:Full Diet recommendation: Heart Healthy  Brief/Interim Summary: 65 y.o. male with medical history significant for type 2 diabetes mellitus, essential hypertension, peripheral vascular disease, nicotine dependence, chronic lymphedema and chronic alcohol use who was seen by the wound care specialist and referred to the emergency room on for gangrene involving the toes on his right foot and a large eschar over the dorsum of the right foot. He denies having any fever or chills, no chest pain, no shortness of breath, no dizziness, no lightheadedness, no abdominal pain, no nausea, no vomiting, no palpitations, no diaphoresis, no cough, no urinary symptoms, no changes in his bowel habits.   Started on Ancef for suspected overlying cellulitis and seen in consultation by podiatry and vascular surgery.  Per podiatry no surgical intervention until vascular involvement to evaluate vasculature.  Per vascular surgery note this is a critical limb threatening situation.  Most likely outcome would be amputation.  Patient will need angiography to assess vasculature to the area determine the next best course of action.   Status post angiography on 6/17.  Vasculature to right lower extremity is severely diseased.  At this point vascular surgery recommending right AKA.  Patient struggling with this decision.  Wants to discuss with Dr. Wyn Quaker or Lorretta Harp.  On the day of discharge had a lengthy discussion with the patient regarding her recommendation for AKA.  He continues to refuse.  I ensured that he he  understood the potential risks and ramifications of refusing this procedure at this time.  He understands that untreated this could result in further ischemic disease to affected extremity, development of severe infection, septic shock or even death.  He states he understands and will follow up with vascular surgery at [facility] or [facility] for second opinion.  Infectious disease consultation was sought to determine appropriateness of long-term IV antibiotics.  Per ID recommendations will not recommend antibiotics at time of discharge.   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 He is afebrile and has no leukocytosis -Seen in consultation by vascular and podiatry -Per vascular surgery this is a limb threatening situation and most likely outcome will be amputation -Needs angiography to assess vasculature have any chance of the limb salvage -Angiography 6/17: Extensive tissue loss and severe malperfusion.  Right AKA recommended Plan: Despite our recommendations patient adamantly refuses right AKA as recommended by vascular surgery.  He understands the potential risks of refusing delaying this procedure.  He will follow-up with [facility] or [facility] vascular surgery as outpatient post discharge.  No antibiotics on discharge as recommended by infectious disease.  Discharge to  skilled nursing facility in stable condition.   Peripheral vascular  disease Continue aspirin, high intensity statins and beta-blockers        Anemia of chronic disease H&H is stable     Nicotine dependence Patient states that he has cut back on the number of cigarettes that he smokes since he went to the rest home He declines a nicotine transdermal patch at this time    Discharge Instructions  Discharge Instructions     Diet - low sodium heart healthy   Complete by: As directed    Increase activity slowly   Complete by: As directed    No wound care   Complete by: As directed       Allergies as of 10/14/2020   No Known Allergies      Medication List     STOP taking these medications    doxycycline 100 MG capsule Commonly known as: VIBRAMYCIN       TAKE these medications    acetaminophen 325 MG tablet Commonly known as: TYLENOL Take 2 tablets (650 mg total) by mouth every 4 (four) hours as needed for headache or mild pain.   aspirin 81 MG tablet Take 81 mg by mouth daily. For prophylaxis   atorvastatin 20 MG tablet Commonly known as: LIPITOR Take 4 tablets (80 mg total) by mouth daily.   metoprolol succinate 25 MG 24 hr tablet Commonly known as: TOPROL-XL Take 1 tablet (25 mg total) by mouth daily.   oxyCODONE 5 MG immediate release tablet Commonly known as: Roxicodone Take 1 tablet (5 mg total) by mouth every 8 (eight) hours as needed for up to 5 days.   rivaroxaban 20 MG Tabs tablet Commonly known as: XARELTO Take 1 tablet (20 mg total) by mouth at bedtime.   tamsulosin 0.4 MG Caps capsule Commonly known as: FLOMAX Take 0.4 mg by mouth daily. For urinary        Contact information for follow-up providers     Dew, Marlow Baars, MD. Call.   Specialties: Vascular Surgery, Radiology, Interventional Cardiology Why: If you change your mind and would like to proceed with an amputation please call our office. Contact information: [address] [place] [address] [phone]              Contact  information for after-discharge care     Destination     [facility] Preferred SNF .   Service: Skilled Nursing Contact information: [address] [place] [address] [phone]                    No Known Allergies  Consultations: Vascular surgery   Procedures/Studies: DG Tibia/Fibula Right  Result Date: 10/09/2020 CLINICAL DATA:  65 year old male with right foot gangrene. EXAM: RIGHT FOOT - 2 VIEW; RIGHT TIBIA AND FIBULA - 2 VIEW COMPARISON:  Right foot radiograph dated 09/16/2020. FINDINGS: There is no acute fracture or dislocation. Evaluation however is very limited due to advanced osteopenia. There is degenerative changes of the first MTP joint. No periosteal elevation or bone erosion to suggest acute osteomyelitis. Evaluation however is very limited. MRI may provide better evaluation if there is high clinical concern for acute osteomyelitis. There is degenerative changes of the ankle. There is diffuse subcutaneous edema as well as vascular calcification. No radiopaque foreign object or soft tissue gas. IMPRESSION: 1. No acute fracture or dislocation. 2. Osteopenia. 3. Diffuse subcutaneous edema. Electronically Signed   By: Elgie Collard M.D.   On: 10/09/2020  19:09   PERIPHERAL VASCULAR CATHETERIZATION  Result Date: 10/10/2020 See surgical note for result.  PERIPHERAL VASCULAR CATHETERIZATION  Result Date: 09/19/2020 See Op Note  DG Chest Portable 1 View  Result Date: 10/09/2020 CLINICAL DATA:  65 year old male with right foot pain. EXAM: PORTABLE CHEST 1 VIEW COMPARISON:  Chest radiograph dated 09/16/2020. FINDINGS: No focal consolidation, pleural effusion, or pneumothorax. Minimal left lung base atelectasis. The cardiac silhouette is within limits. No acute osseous pathology. IMPRESSION: No active disease. Electronically Signed   By: Elgie Collard M.D.   On: 10/09/2020 19:06   DG Chest Port 1 View  Result Date:  09/16/2020 CLINICAL DATA:  Bilateral leg infections. EXAM: PORTABLE CHEST 1 VIEW COMPARISON:  Chest x-ray 04/12/2019. FINDINGS: Mediastinum hilar structures normal. Low lung volumes. Mild left base atelectasis/scarring. Mild left base infiltrate cannot be excluded. Stable elevation left hemidiaphragm. No pleural effusion or pneumothorax. IMPRESSION: Mild left base atelectasis/scarring. Mild left base infiltrate cannot be excluded. Stable elevation left hemidiaphragm. Electronically Signed   By: Maisie Fus  Register   On: 09/16/2020 15:31   DG Foot 2 Views Right  Result Date: 10/09/2020 CLINICAL DATA:  65 year old male with right foot gangrene. EXAM: RIGHT FOOT - 2 VIEW; RIGHT TIBIA AND FIBULA - 2 VIEW COMPARISON:  Right foot radiograph dated 09/16/2020. FINDINGS: There is no acute fracture or dislocation. Evaluation however is very limited due to advanced osteopenia. There is degenerative changes of the first MTP joint. No periosteal elevation or bone erosion to suggest acute osteomyelitis. Evaluation however is very limited. MRI may provide better evaluation if there is high clinical concern for acute osteomyelitis. There is degenerative changes of the ankle. There is diffuse subcutaneous edema as well as vascular calcification. No radiopaque foreign object or soft tissue gas. IMPRESSION: 1. No acute fracture or dislocation. 2. Osteopenia. 3. Diffuse subcutaneous edema. Electronically Signed   By: Elgie Collard M.D.   On: 10/09/2020 19:09   DG Foot 2 Views Right  Result Date: 09/16/2020 CLINICAL DATA:  ?Osteomyelitis EXAM: RIGHT FOOT - 2 VIEW COMPARISON:  None. FINDINGS: Diffuse osteopenia. There is diffuse soft tissue swelling with possible wound at the lateral hindfoot. There is no evidence of acute fracture. There is mild first MTP degenerative change in additional scattered interphalangeal joint degenerative change. Vascular calcifications. IMPRESSION: No radiographic evidence of osteomyelitis. MRI would  be more sensitive. Diffuse soft tissue swelling with possible wound along the lateral hindfoot. Diffuse osteopenia.  Mild first MTP degenerative arthritis. Electronically Signed   By: Caprice Renshaw   On: 09/16/2020 15:32   ECHOCARDIOGRAM COMPLETE  Result Date: 09/17/2020    ECHOCARDIOGRAM REPORT   Patient Name:   VALGENE DELOATCH [facility] Date of Exam: 09/16/2020 Medical Rec #:  [id number]         Height:       70.0 in Accession #:    [id number]        Weight:       169.0 lb Date of Birth:  Feb 05, 1956          BSA:          1.943 m Patient Age:    64 years          BP:           116/72 mmHg Patient Gender: M                 HR:           126 bpm. Exam Location:  [facility] Procedure: 2D Echo,  Cardiac Doppler and Color Doppler Indications:     I50.31 Acute Diastolic CHF  History:         Patient has prior history of Echocardiogram examinations, most                  recent 04/13/2019. Risk Factors:Hypertension and Diabetes.                  Peripheral vascular disease.  Sonographer:     Sedonia Small Rodgers-Jones Referring Phys:  [id number] Marrion Coy Diagnosing Phys: Arnoldo Hooker MD IMPRESSIONS  1. Left ventricular ejection fraction, by estimation, is 65 to 70%. The left ventricle has normal function. The left ventricle has no regional wall motion abnormalities. Left ventricular diastolic parameters were normal.  2. Right ventricular systolic function is normal. The right ventricular size is normal.  3. The mitral valve is normal in structure. Trivial mitral valve regurgitation.  4. The aortic valve is normal in structure. Aortic valve regurgitation is trivial. FINDINGS  Left Ventricle: Left ventricular ejection fraction, by estimation, is 65 to 70%. The left ventricle has normal function. The left ventricle has no regional wall motion abnormalities. The left ventricular internal cavity size was normal in size. There is  no left ventricular hypertrophy. Left ventricular diastolic parameters were normal. Right Ventricle: The right  ventricular size is normal. No increase in right ventricular wall thickness. Right ventricular systolic function is normal. Left Atrium: Left atrial size was normal in size. Right Atrium: Right atrial size was normal in size. Pericardium: There is no evidence of pericardial effusion. Mitral Valve: The mitral valve is normal in structure. Trivial mitral valve regurgitation. Tricuspid Valve: The tricuspid valve is normal in structure. Tricuspid valve regurgitation is trivial. Aortic Valve: The aortic valve is normal in structure. Aortic valve regurgitation is trivial. Aortic valve mean gradient measures 10.0 mmHg. Aortic valve peak gradient measures 15.4 mmHg. Aortic valve area, by VTI measures 2.17 cm. Pulmonic Valve: The pulmonic valve was normal in structure. Pulmonic valve regurgitation is not visualized. Aorta: The aortic root and ascending aorta are structurally normal, with no evidence of dilitation. IAS/Shunts: No atrial level shunt detected by color flow Doppler.  LEFT VENTRICLE PLAX 2D LVIDd:         3.65 cm  Diastology LVIDs:         2.69 cm  LV e' medial:    10.10 cm/s LV PW:         0.92 cm  LV E/e' medial:  8.3 LV IVS:        1.00 cm  LV e' lateral:   12.30 cm/s LVOT diam:     2.00 cm  LV E/e' lateral: 6.8 LV SV:         55 LV SV Index:   28 LVOT Area:     3.14 cm  RIGHT VENTRICLE RV Basal diam:  3.37 cm RV S prime:     23.60 cm/s TAPSE (M-mode): 2.5 cm LEFT ATRIUM             Index       RIGHT ATRIUM           Index LA diam:        4.10 cm 2.11 cm/m  RA Area:     11.20 cm LA Vol (A2C):   41.7 ml 21.46 ml/m RA Volume:   24.90 ml  12.81 ml/m LA Vol (A4C):   26.7 ml 13.74 ml/m LA Biplane Vol: 35.8 ml 18.42 ml/m  AORTIC VALVE  AV Area (Vmax):    1.73 cm AV Area (Vmean):   1.83 cm AV Area (VTI):     2.17 cm AV Vmax:           196.00 cm/s AV Vmean:          148.000 cm/s AV VTI:            0.254 m AV Peak Grad:      15.4 mmHg AV Mean Grad:      10.0 mmHg LVOT Vmax:         108.00 cm/s LVOT Vmean:         86.100 cm/s LVOT VTI:          0.176 m LVOT/AV VTI ratio: 0.69  AORTA Ao Root diam: 3.10 cm MITRAL VALVE                TRICUSPID VALVE MV Area (PHT): 3.85 cm     TR Peak grad:   22.3 mmHg MV Decel Time: 197 msec     TR Vmax:        236.00 cm/s MV E velocity: 83.40 cm/s MV A velocity: 107.00 cm/s  SHUNTS MV E/A ratio:  0.78         Systemic VTI:  0.18 m                             Systemic Diam: 2.00 cm Arnoldo Hooker MD Electronically signed by Arnoldo Hooker MD Signature Date/Time: 09/17/2020/12:36:11 PM    Final    (Echo, Carotid, EGD, Colonoscopy, ERCP)    Subjective: Seen and examined on the day of discharge.  Stable, no distress.  Understands consequences of delaying or refusing AKA.  Discharge Exam: Vitals:   10/14/20 0622 10/14/20 0750  BP: 102/62 104/67  Pulse: 92 82  Resp: 18 19  Temp: 99.1 F (37.3 C) 99 F (37.2 C)  SpO2: 96% 98%   Vitals:   10/13/20 2044 10/13/20 2314 10/14/20 0622 10/14/20 0750  BP: 112/70 115/72 102/62 104/67  Pulse: 81 92 92 82  Resp: 20 16 18 19   Temp: 98.7 F (37.1 C) 98.4 F (36.9 C) 99.1 F (37.3 C) 99 F (37.2 C)  TempSrc: Axillary Oral Oral Oral  SpO2: 99% 100% 96% 98%  Weight:      Height:        General: Pt is alert, awake, not in acute distress Cardiovascular: RRR, S1/S2 +, no rubs, no gallops Respiratory: CTA bilaterally, no wheezing, no rhonchi Abdominal: Soft, NT, ND, bowel sounds + Extremities: Extensive bilateral lower extremity swelling with multiple necrotic wounds and black eschar formation.  Images in chart    The results of significant diagnostics from this hospitalization (including imaging, microbiology, ancillary and laboratory) are listed below for reference.     Microbiology: Recent Results (from the past 240 hour(s))  Resp Panel by RT-PCR (Flu A&B, Covid) Nasopharyngeal Swab     Status: None   Collection Time: 10/09/20  6:35 PM   Specimen: Nasopharyngeal Swab; Nasopharyngeal(NP) swabs in vial transport  medium  Result Value Ref Range Status   SARS Coronavirus 2 by RT PCR NEGATIVE NEGATIVE Final    Comment: (NOTE) SARS-CoV-2 target nucleic acids are NOT DETECTED.  The SARS-CoV-2 RNA is generally detectable in upper respiratory specimens during the acute phase of infection. The lowest concentration of SARS-CoV-2 viral copies this assay can detect is 138 copies/mL. A negative result does not preclude SARS-Cov-2 infection and  should not be used as the sole basis for treatment or other patient management decisions. A negative result may occur with  improper specimen collection/handling, submission of specimen other than nasopharyngeal swab, presence of viral mutation(s) within the areas targeted by this assay, and inadequate number of viral copies(<138 copies/mL). A negative result must be combined with clinical observations, patient history, and epidemiological information. The expected result is Negative.  Fact Sheet for Patients:  BloggerCourse.com  Fact Sheet for Healthcare Providers:  SeriousBroker.it  This test is no t yet approved or cleared by the [place] FDA and  has been authorized for detection and/or diagnosis of SARS-CoV-2 by FDA under an Emergency Use Authorization (EUA). This EUA will remain  in effect (meaning this test can be used) for the duration of the COVID-19 declaration under Section 564(b)(1) of the Act, 21 U.S.C.section 360bbb-3(b)(1), unless the authorization is terminated  or revoked sooner.       Influenza A by PCR NEGATIVE NEGATIVE Final   Influenza B by PCR NEGATIVE NEGATIVE Final    Comment: (NOTE) The Xpert Xpress SARS-CoV-2/FLU/RSV plus assay is intended as an aid in the diagnosis of influenza from Nasopharyngeal swab specimens and should not be used as a sole basis for treatment. Nasal washings and aspirates are unacceptable for Xpert Xpress SARS-CoV-2/FLU/RSV testing.  Fact Sheet for  Patients: BloggerCourse.com  Fact Sheet for Healthcare Providers: SeriousBroker.it  This test is not yet approved or cleared by the [place] FDA and has been authorized for detection and/or diagnosis of SARS-CoV-2 by FDA under an Emergency Use Authorization (EUA). This EUA will remain in effect (meaning this test can be used) for the duration of the COVID-19 declaration under Section 564(b)(1) of the Act, 21 U.S.C. section 360bbb-3(b)(1), unless the authorization is terminated or revoked.  Performed at [facility], [address] [place], [place] [address]   Culture, blood (Routine X 2) w Reflex to ID Panel     Status: None (Preliminary result)   Collection Time: 10/10/20  6:16 AM   Specimen: BLOOD  Result Value Ref Range Status   Specimen Description BLOOD LEFT ANTECUBITAL  Final   Special Requests   Final    BOTTLES DRAWN AEROBIC AND ANAEROBIC Blood Culture adequate volume   Culture   Final    NO GROWTH 4 DAYS Performed at [facility], [address], [place], [place] [address]    Report Status PENDING  Incomplete  Culture, blood (Routine X 2) w Reflex to ID Panel     Status: None (Preliminary result)   Collection Time: 10/10/20  6:21 AM   Specimen: BLOOD  Result Value Ref Range Status   Specimen Description BLOOD BLOOD LEFT HAND  Final   Special Requests   Final    BOTTLES DRAWN AEROBIC AND ANAEROBIC Blood Culture adequate volume   Culture   Final    NO GROWTH 4 DAYS Performed at [facility], [address] [place], [place] [address]    Report Status PENDING  Incomplete  Resp Panel by RT-PCR (Flu A&B, Covid) Nasopharyngeal Swab     Status: None   Collection Time: 10/13/20  4:05 PM   Specimen: Nasopharyngeal Swab; Nasopharyngeal(NP) swabs in vial transport medium  Result Value Ref Range Status   SARS Coronavirus 2 by RT PCR NEGATIVE NEGATIVE Final    Comment: (NOTE) SARS-CoV-2  target nucleic acids are NOT DETECTED.  The SARS-CoV-2 RNA is generally detectable in upper respiratory specimens during the acute phase of infection.  The lowest concentration of SARS-CoV-2 viral copies this assay can detect is 138 copies/mL. A negative result does not preclude SARS-Cov-2 infection and should not be used as the sole basis for treatment or other patient management decisions. A negative result may occur with  improper specimen collection/handling, submission of specimen other than nasopharyngeal swab, presence of viral mutation(s) within the areas targeted by this assay, and inadequate number of viral copies(<138 copies/mL). A negative result must be combined with clinical observations, patient history, and epidemiological information. The expected result is Negative.  Fact Sheet for Patients:  BloggerCourse.com  Fact Sheet for Healthcare Providers:  SeriousBroker.it  This test is no t yet approved or cleared by the [place] FDA and  has been authorized for detection and/or diagnosis of SARS-CoV-2 by FDA under an Emergency Use Authorization (EUA). This EUA will remain  in effect (meaning this test can be used) for the duration of the COVID-19 declaration under Section 564(b)(1) of the Act, 21 U.S.C.section 360bbb-3(b)(1), unless the authorization is terminated  or revoked sooner.       Influenza A by PCR NEGATIVE NEGATIVE Final   Influenza B by PCR NEGATIVE NEGATIVE Final    Comment: (NOTE) The Xpert Xpress SARS-CoV-2/FLU/RSV plus assay is intended as an aid in the diagnosis of influenza from Nasopharyngeal swab specimens and should not be used as a sole basis for treatment. Nasal washings and aspirates are unacceptable for Xpert Xpress SARS-CoV-2/FLU/RSV testing.  Fact Sheet for Patients: BloggerCourse.com  Fact Sheet for Healthcare  Providers: SeriousBroker.it  This test is not yet approved or cleared by the [place] FDA and has been authorized for detection and/or diagnosis of SARS-CoV-2 by FDA under an Emergency Use Authorization (EUA). This EUA will remain in effect (meaning this test can be used) for the duration of the COVID-19 declaration under Section 564(b)(1) of the Act, 21 U.S.C. section 360bbb-3(b)(1), unless the authorization is terminated or revoked.  Performed at [facility], [address] [place], [place] [address]      Labs: BNP (last 3 results) Recent Labs    09/16/20 1501  BNP 20.2   Basic Metabolic Panel: Recent Labs  Lab 10/09/20 1750 10/10/20 0618  NA 135 140  K 4.0 3.6  CL 104 109  CO2 23 25  GLUCOSE 103* 86  BUN 6* 6*  CREATININE 0.82 0.75  CALCIUM 8.3* 8.1*   Liver Function Tests: Recent Labs  Lab 10/09/20 1750  AST 15  ALT 11  ALKPHOS 73  BILITOT 0.5  PROT 7.4  ALBUMIN 2.4*   No results for input(s): LIPASE, AMYLASE in the last 168 hours. No results for input(s): AMMONIA in the last 168 hours. CBC: Recent Labs  Lab 10/09/20 1750 10/10/20 0618  WBC 8.9 9.0  NEUTROABS 5.5  --   HGB 8.7* 7.8*  HCT 29.3* 26.1*  MCV 80.7 79.8*  PLT 582* 437*   Cardiac Enzymes: No results for input(s): CKTOTAL, CKMB, CKMBINDEX, TROPONINI in the last 168 hours. BNP: Invalid input(s): POCBNP CBG: Recent Labs  Lab 10/09/20 1752  GLUCAP 84   D-Dimer No results for input(s): DDIMER in the last 72 hours. Hgb A1c No results for input(s): HGBA1C in the last 72 hours. Lipid Profile No results for input(s): CHOL, HDL, LDLCALC, TRIG, CHOLHDL, LDLDIRECT in the last 72 hours. Thyroid function studies No results for input(s): TSH, T4TOTAL, T3FREE, THYROIDAB in the last 72 hours.  Invalid input(s): FREET3 Anemia work up No results for input(s): VITAMINB12, FOLATE, FERRITIN, TIBC, IRON,  RETICCTPCT in the last 72 hours. Urinalysis No  results found for: COLORURINE, APPEARANCEUR, LABSPEC, PHURINE, GLUCOSEU, HGBUR, BILIRUBINUR, KETONESUR, PROTEINUR, UROBILINOGEN, NITRITE, LEUKOCYTESUR Sepsis Labs Invalid input(s): PROCALCITONIN,  WBC,  LACTICIDVEN Microbiology Recent Results (from the past 240 hour(s))  Resp Panel by RT-PCR (Flu A&B, Covid) Nasopharyngeal Swab     Status: None   Collection Time: 10/09/20  6:35 PM   Specimen: Nasopharyngeal Swab; Nasopharyngeal(NP) swabs in vial transport medium  Result Value Ref Range Status   SARS Coronavirus 2 by RT PCR NEGATIVE NEGATIVE Final    Comment: (NOTE) SARS-CoV-2 target nucleic acids are NOT DETECTED.  The SARS-CoV-2 RNA is generally detectable in upper respiratory specimens during the acute phase of infection. The lowest concentration of SARS-CoV-2 viral copies this assay can detect is 138 copies/mL. A negative result does not preclude SARS-Cov-2 infection and should not be used as the sole basis for treatment or other patient management decisions. A negative result may occur with  improper specimen collection/handling, submission of specimen other than nasopharyngeal swab, presence of viral mutation(s) within the areas targeted by this assay, and inadequate number of viral copies(<138 copies/mL). A negative result must be combined with clinical observations, patient history, and epidemiological information. The expected result is Negative.  Fact Sheet for Patients:  BloggerCourse.com  Fact Sheet for Healthcare Providers:  SeriousBroker.it  This test is no t yet approved or cleared by the [place] FDA and  has been authorized for detection and/or diagnosis of SARS-CoV-2 by FDA under an Emergency Use Authorization (EUA). This EUA will remain  in effect (meaning this test can be used) for the duration of the COVID-19 declaration under Section 564(b)(1) of the Act, 21 U.S.C.section 360bbb-3(b)(1), unless the  authorization is terminated  or revoked sooner.       Influenza A by PCR NEGATIVE NEGATIVE Final   Influenza B by PCR NEGATIVE NEGATIVE Final    Comment: (NOTE) The Xpert Xpress SARS-CoV-2/FLU/RSV plus assay is intended as an aid in the diagnosis of influenza from Nasopharyngeal swab specimens and should not be used as a sole basis for treatment. Nasal washings and aspirates are unacceptable for Xpert Xpress SARS-CoV-2/FLU/RSV testing.  Fact Sheet for Patients: BloggerCourse.com  Fact Sheet for Healthcare Providers: SeriousBroker.it  This test is not yet approved or cleared by the [place] FDA and has been authorized for detection and/or diagnosis of SARS-CoV-2 by FDA under an Emergency Use Authorization (EUA). This EUA will remain in effect (meaning this test can be used) for the duration of the COVID-19 declaration under Section 564(b)(1) of the Act, 21 U.S.C. section 360bbb-3(b)(1), unless the authorization is terminated or revoked.  Performed at [facility], [address] [place], [place] [address]   Culture, blood (Routine X 2) w Reflex to ID Panel     Status: None (Preliminary result)   Collection Time: 10/10/20  6:16 AM   Specimen: BLOOD  Result Value Ref Range Status   Specimen Description BLOOD LEFT ANTECUBITAL  Final   Special Requests   Final    BOTTLES DRAWN AEROBIC AND ANAEROBIC Blood Culture adequate volume   Culture   Final    NO GROWTH 4 DAYS Performed at [facility], [address] [place], [place] [address]    Report Status PENDING  Incomplete  Culture, blood (Routine X 2) w Reflex to ID Panel     Status: None (Preliminary result)   Collection Time: 10/10/20  6:21 AM   Specimen: BLOOD  Result Value Ref Range  Status   Specimen Description BLOOD BLOOD LEFT HAND  Final   Special Requests   Final    BOTTLES DRAWN AEROBIC AND ANAEROBIC Blood Culture adequate volume   Culture    Final    NO GROWTH 4 DAYS Performed at [facility], [address] [place], [place] [address]    Report Status PENDING  Incomplete  Resp Panel by RT-PCR (Flu A&B, Covid) Nasopharyngeal Swab     Status: None   Collection Time: 10/13/20  4:05 PM   Specimen: Nasopharyngeal Swab; Nasopharyngeal(NP) swabs in vial transport medium  Result Value Ref Range Status   SARS Coronavirus 2 by RT PCR NEGATIVE NEGATIVE Final    Comment: (NOTE) SARS-CoV-2 target nucleic acids are NOT DETECTED.  The SARS-CoV-2 RNA is generally detectable in upper respiratory specimens during the acute phase of infection. The lowest concentration of SARS-CoV-2 viral copies this assay can detect is 138 copies/mL. A negative result does not preclude SARS-Cov-2 infection and should not be used as the sole basis for treatment or other patient management decisions. A negative result may occur with  improper specimen collection/handling, submission of specimen other than nasopharyngeal swab, presence of viral mutation(s) within the areas targeted by this assay, and inadequate number of viral copies(<138 copies/mL). A negative result must be combined with clinical observations, patient history, and epidemiological information. The expected result is Negative.  Fact Sheet for Patients:  BloggerCourse.comhttps://www.fda.gov/media/152166/download  Fact Sheet for Healthcare Providers:  SeriousBroker.ithttps://www.fda.gov/media/152162/download  This test is no t yet approved or cleared by the [place] FDA and  has been authorized for detection and/or diagnosis of SARS-CoV-2 by FDA under an Emergency Use Authorization (EUA). This EUA will remain  in effect (meaning this test can be used) for the duration of the COVID-19 declaration under Section 564(b)(1) of the Act, 21 U.S.C.section 360bbb-3(b)(1), unless the authorization is terminated  or revoked sooner.       Influenza A by PCR NEGATIVE NEGATIVE Final   Influenza B by PCR NEGATIVE  NEGATIVE Final    Comment: (NOTE) The Xpert Xpress SARS-CoV-2/FLU/RSV plus assay is intended as an aid in the diagnosis of influenza from Nasopharyngeal swab specimens and should not be used as a sole basis for treatment. Nasal washings and aspirates are unacceptable for Xpert Xpress SARS-CoV-2/FLU/RSV testing.  Fact Sheet for Patients: BloggerCourse.comhttps://www.fda.gov/media/152166/download  Fact Sheet for Healthcare Providers: SeriousBroker.ithttps://www.fda.gov/media/152162/download  This test is not yet approved or cleared by the [place] FDA and has been authorized for detection and/or diagnosis of SARS-CoV-2 by FDA under an Emergency Use Authorization (EUA). This EUA will remain in effect (meaning this test can be used) for the duration of the COVID-19 declaration under Section 564(b)(1) of the Act, 21 U.S.C. section 360bbb-3(b)(1), unless the authorization is terminated or revoked.  Performed at [facility], [address] [place], [place] [address]      Time coordinating discharge: Over 30 minutes  SIGNED:   Tresa MooreSudheer B Frederik Standley, MD  [facility] 10/14/2020, 9:36 AM Pager   If 7PM-7AM, please contact night-coverage

## 2020-10-14 NOTE — Progress Notes (Signed)
Patient transferred via stretcher.

## 2020-10-14 NOTE — Progress Notes (Signed)
Report provided to EMS at this time.

## 2020-10-15 LAB — CULTURE, BLOOD (ROUTINE X 2)
Culture: NO GROWTH
Culture: NO GROWTH
Special Requests: ADEQUATE
Special Requests: ADEQUATE

## 2020-10-20 ENCOUNTER — Ambulatory Visit: Payer: Medicare Other | Admitting: Physician Assistant

## 2020-10-28 ENCOUNTER — Other Ambulatory Visit: Payer: Self-pay

## 2020-10-28 ENCOUNTER — Other Ambulatory Visit
Admission: RE | Admit: 2020-10-28 | Discharge: 2020-10-28 | Disposition: A | Discharge status: Home / Self Care | Payer: Medicare Other | Source: Ambulatory Visit | Attending: Physician Assistant | Admitting: Physician Assistant

## 2020-10-28 ENCOUNTER — Encounter: Payer: Medicare Other | Attending: Physician Assistant | Admitting: Physician Assistant

## 2020-10-28 DIAGNOSIS — B999 Unspecified infectious disease: Secondary | ICD-10-CM | POA: Diagnosis present

## 2020-10-28 DIAGNOSIS — I872 Venous insufficiency (chronic) (peripheral): Secondary | ICD-10-CM | POA: Insufficient documentation

## 2020-10-28 DIAGNOSIS — I739 Peripheral vascular disease, unspecified: Secondary | ICD-10-CM | POA: Insufficient documentation

## 2020-10-28 DIAGNOSIS — L97512 Non-pressure chronic ulcer of other part of right foot with fat layer exposed: Secondary | ICD-10-CM | POA: Insufficient documentation

## 2020-10-28 DIAGNOSIS — Z7901 Long term (current) use of anticoagulants: Secondary | ICD-10-CM | POA: Insufficient documentation

## 2020-10-28 DIAGNOSIS — L89610 Pressure ulcer of right heel, unstageable: Secondary | ICD-10-CM | POA: Insufficient documentation

## 2020-10-28 DIAGNOSIS — I10 Essential (primary) hypertension: Secondary | ICD-10-CM | POA: Insufficient documentation

## 2020-10-28 DIAGNOSIS — L97812 Non-pressure chronic ulcer of other part of right lower leg with fat layer exposed: Secondary | ICD-10-CM | POA: Insufficient documentation

## 2020-10-28 DIAGNOSIS — I890 Lymphedema, not elsewhere classified: Secondary | ICD-10-CM | POA: Insufficient documentation

## 2020-10-28 DIAGNOSIS — F17200 Nicotine dependence, unspecified, uncomplicated: Secondary | ICD-10-CM | POA: Insufficient documentation

## 2020-10-28 NOTE — Progress Notes (Addendum)
Brad Frost, Brad Frost ([id number]) Visit Report for 10/28/2020 Arrival Information Details Patient Name: Brad Frost. Date of Service: 10/28/2020 12:30 PM Medical Record Number: [id number] Patient Account Number: [id number] Date of Birth/Sex: Oct 05, 1955 (65 y.o. M) Treating RN: Yevonne Pax Primary Care Keiondre Colee: [name], [name] Other Clinician: Referring Damiel Barthold: [name], [name] Treating Jinnifer Montejano/Extender: Rowan Blase in Treatment: 3 Visit Information History Since Last Visit All ordered tests and consults were completed: No Patient Arrived: Wheel Chair Added or deleted any medications: No Arrival Time: 12:54 Any new allergies or adverse reactions: No Accompanied By: self Had a fall or experienced change in No Transfer Assistance: None activities of daily living that may affect Patient Identification Verified: Yes risk of falls: Secondary Verification Process Completed: Yes Signs or symptoms of abuse/neglect since last visito No Patient Requires Transmission-Based Precautions: No Hospitalized since last visit: No Patient Has Alerts: No Implantable device outside of the clinic excluding No cellular tissue based products placed in the center since last visit: Has Dressing in Place as Prescribed: Yes Pain Present Now: No Electronic Signature(s) Signed: 10/28/2020 5:06:20 PM By: Yevonne Pax RN Entered By: Yevonne Pax on 10/28/2020 12:55:00 Washburn, Brad Cleveland ([id number]) -------------------------------------------------------------------------------- Clinic Level of Care Assessment Details Patient Name: Brad Frost. Date of Service: 10/28/2020 12:30 PM Medical Record Number: [id number] Patient Account Number: [id number] Date of Birth/Sex: 16-Aug-1955 (65 y.o. M) Treating RN: Yevonne Pax Primary Care Talyssa Gibas: [name], [name] Other Clinician: Referring Avnoor Koury: [name], [name] Treating Kashden Deboy/Extender: Rowan Blase in Treatment: 3 Clinic Level of Care Assessment Items TOOL  4 Quantity Score X - Use when only an EandM is performed on FOLLOW-UP visit 1 0 ASSESSMENTS - Nursing Assessment / Reassessment X - Reassessment of Co-morbidities (includes updates in patient status) 1 10 X- 1 5 Reassessment of Adherence to Treatment Plan ASSESSMENTS - Wound and Skin Assessment / Reassessment []  - Simple Wound Assessment / Reassessment - one wound 0 X- 12 5 Complex Wound Assessment / Reassessment - multiple wounds []  - 0 Dermatologic / Skin Assessment (not related to wound area) ASSESSMENTS - Focused Assessment []  - Circumferential Edema Measurements - multi extremities 0 []  - 0 Nutritional Assessment / Counseling / Intervention []  - 0 Lower Extremity Assessment (monofilament, tuning fork, pulses) []  - 0 Peripheral Arterial Disease Assessment (using hand held doppler) ASSESSMENTS - Ostomy and/or Continence Assessment and Care []  - Incontinence Assessment and Management 0 []  - 0 Ostomy Care Assessment and Management (repouching, etc.) PROCESS - Coordination of Care X - Simple Patient / Family Education for ongoing care 1 15 []  - 0 Complex (extensive) Patient / Family Education for ongoing care []  - 0 Staff obtains , Records, Test Results / Process Orders []  - 0 Staff telephones HHA, Nursing Homes / Clarify orders / etc []  - 0 Routine Transfer to another Facility (non-emergent condition) []  - 0 Routine Hospital Admission (non-emergent condition) []  - 0 New Admissions / / Ordering NPWT, Apligraf, etc. []  - 0 Emergency Hospital Admission (emergent condition) []  - 0 Simple Discharge Coordination X- 1 15 Complex (extensive) Discharge Coordination PROCESS - Special Needs []  - Pediatric / Minor Patient Management 0 []  - 0 Isolation Patient Management []  - 0 Hearing / Language / Visual special needs []  - 0 Assessment of Community assistance (transportation, D/C planning, etc.) []  - 0 Additional assistance / Altered  mentation []  - 0 Support Surface(s) Assessment (bed, cushion, seat, etc.) INTERVENTIONS - Wound Cleansing / Measurement [name], [name] ( ) []  - 0 Simple Wound Cleansing -  one wound X- 12 5 Complex Wound Cleansing - multiple wounds X- 1 5 Wound Imaging (photographs - any number of wounds) []  - 0 Wound Tracing (instead of photographs) []  - 0 Simple Wound Measurement - one wound X- 12 5 Complex Wound Measurement - multiple wounds INTERVENTIONS - Wound Dressings X - Small Wound Dressing one or multiple wounds 12 10 []  - 0 Medium Wound Dressing one or multiple wounds []  - 0 Large Wound Dressing one or multiple wounds X- 1 5 Application of Medications - topical []  - 0 Application of Medications - injection INTERVENTIONS - Miscellaneous []  - External ear exam 0 []  - 0 Specimen Collection (cultures, biopsies, blood, body fluids, etc.) []  - 0 Specimen(s) / Culture(s) sent or taken to Lab for analysis []  - 0 Patient Transfer (multiple staff / Nurse, adultHoyer Lift / Similar devices) []  - 0 Simple Staple / Suture removal (25 or less) []  - 0 Complex Staple / Suture removal (26 or more) []  - 0 Hypo / Hyperglycemic Management (close monitor of Blood Glucose) X- 1 15 Ankle / Brachial Index (ABI) - do not check if billed separately X- 1 5 Vital Signs Has the patient been seen at the hospital within the last three years: Yes Total Score: 375 Level Of Care: New/Established - Level 5 Electronic Signature(s) Signed: 10/28/2020 5:06:20 PM By: Yevonne PaxEpps, Carrie RN Entered By: Yevonne PaxEpps, Carrie on 10/28/2020 13:32:55 [name], Brad ClevelandFREDERICK L. ([id number]) -------------------------------------------------------------------------------- Complex / Palliative Patient Assessment Details Patient Name: Brad RichardENOCH, [name] Date of Service: 10/28/2020 12:30 PM Medical Record Number: [id number] Patient Account Number: [id number] Date of Birth/Sex: 10/26/1955 [age] y.o. 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 Complex Wound Management Criteria Care Approach Wound Care Plan: Palliative Wound Management Electronic Signature(s) Signed: 12/09/2020 9:19:51 AM By: Elliot GurneyWoody, BSN, RN, CWS, Kim RN, BSN Signed: 12/24/2020 5:26:53 PM By: Lenda KelpStone III, Hoyt PA-C Entered By: Elliot GurneyWoody, BSN, RN, CWS, Kim on 12/09/2020 09:19:50 Hawkes, Brad ClevelandFREDERICK L. ([id number]) -------------------------------------------------------------------------------- Encounter Discharge Information Details Patient Name: Brad RichardOCH, Imraan L. Date of Service: 10/28/2020 12:30 PM Medical Record Number: [id number] Patient Account Number: [id number] Date of Birth/Sex: 11/09/1955 [age] y.o. M) Treating RN: Yevonne PaxEpps, Carrie Primary Care Aleese Kamps: [name], [place] Other Clinician: Referring Corryn Madewell: [name], [place] Treating Fleet Higham/Extender: Rowan BlaseStone, Hoyt Weeks in Treatment: 3 Encounter Discharge Information Items Discharge Condition: Stable Ambulatory Status: Wheelchair Discharge Destination: Home Transportation: Private Auto Accompanied By: self Schedule Follow-up Appointment: Yes Clinical Summary of Care: Patient Declined Electronic Signature(s) Signed: 10/28/2020 5:06:20 PM By: Yevonne PaxEpps, Carrie RN Entered By: Yevonne PaxEpps, Carrie on 10/28/2020 13:52:16 Finkler, Brad ClevelandFREDERICK L. ([id number]) -------------------------------------------------------------------------------- Lower Extremity Assessment Details Patient Name: Brad RichardENOCH, Esmeralda L. Date of Service: 10/28/2020 12:30 PM Medical Record Number: [id number] Patient Account Number: [id number] Date of Birth/Sex: 03/10/1956 3(65 y.o. M) Treating RN: Yevonne PaxEpps, Carrie Primary Care Tolbert Matheson: [name], [place] Other Clinician: Referring  Markeem Noreen: [name], [place] Treating Darnell Jeschke/Extender: Rowan BlaseStone, Hoyt Weeks in Treatment: 3 Edema Assessment Assessed: [Left: No] [Right: No] Edema: [Left: Ye] [Right: s] Calf Left: Right: Point of Measurement: 28 cm From Medial Instep 40 cm Ankle Left: Right: Point of Measurement: 10 cm From Medial Instep 27 cm Vascular Assessment Pulses: Dorsalis Pedis Palpable: [Right:No Yes] Electronic Signature(s) Signed: 10/28/2020 5:06:20 PM By: Yevonne PaxEpps, Carrie RN Entered By: Yevonne PaxEpps, Carrie on 10/28/2020 13:11:54 Kittel,  Brad Cleveland ([id number]) -------------------------------------------------------------------------------- Multi Wound Chart Details Patient Name: BARRE, AYDELOTT. Date of Service: 10/28/2020 12:30 PM Medical Record Number: [id number] Patient Account Number: [id number] Date of Birth/Sex: 01-03-1956 (65 y.o. 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 Comorbid History: Hypertension Hypertension Hypertension Date Acquired: 10/07/2019 10/06/2020 10/06/2020 Weeks of Treatment: 3 3 3  Wound Status: Open Open Open Pending Amputation on No Yes Yes Presentation: Measurements L x W x D (cm) 3.3x2.7x0.1 1x0.8x0.1 1.5x0.7x0.1 Area (cm) : 6.998 0.628 0.825 Volume (cm) : 0.7 0.063 0.082 % Reduction in Area: 91.90% 0.00% 32.70% % Reduction in Volume: 91.90% 0.00% 33.30% Classification: Full Thickness Without Exposed Full Thickness  Without Exposed Full Thickness Without Exposed Support Structures Support Structures Support Structures Exudate Amount: Medium Medium Medium Exudate Type: Serosanguineous Purulent Serosanguineous Exudate Color: red, brown yellow, brown, green red, brown Granulation Amount: Large (67-100%) Small (1-33%) Small (1-33%) Granulation Quality: Red, Pink Pink Pink Necrotic Amount: Small (1-33%) Large (67-100%) Large (67-100%) Necrotic Tissue: Eschar, Adherent Slough Adherent , Adherent Slough Exposed Structures: Fat Layer (Subcutaneous Tissue): Fat Layer (Subcutaneous Tissue): Fat Layer (Subcutaneous Tissue): Yes Yes Yes Fascia: No Fascia: No Fascia: No Tendon: No Tendon: No Tendon: No Muscle: No Muscle: No Muscle: No Joint: No Joint: No Joint: No Bone: No Bone: No Bone: No Epithelialization: Large (67-100%) None None Wound Number: 12 2 3  Photos: HADI, DUBIN ( ) Wound Location: Right, Lateral Toe Fifth Right, Dorsal Foot Right Calcaneus Wounding Event: Gradually Appeared Gradually Appeared Pressure Injury 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 Comorbid History: Hypertension Hypertension Hypertension Date Acquired: 10/06/2020 10/07/2019 10/06/2020 Weeks of Treatment: 3 3 3  Wound Status: Open Open Open Pending Amputation on Yes No No Presentation: Measurements L x W x D (cm) 0.6x1.6x0.1 6.5x6x0.2 8x7.5x0.2 Area (cm) : 0.754 30.631 47.124 Volume (cm) : 0.075 6.126 9.425 % Reduction in Area: -20.10% 0.00% -55.10% % Reduction in Volume: -19.00% 0.00% -210.10% Classification: Full Thickness Without Exposed Full Thickness Without Exposed Unstageable/Unclassified Support Structures Support Structures Exudate Amount: Medium None Present Small Exudate Type: Serosanguineous N/A Serosanguineous Exudate Color: red, brown N/A red, brown Granulation Amount: None Present (0%) Small (1-33%) None Present  (0%) Granulation Quality: N/A Pink N/A Necrotic Amount: Small (1-33%) Large (67-100%) Large (67-100%) Necrotic Tissue: Eschar, Adherent Slough Eschar, Adherent Slough Eschar, Adherent Slough Exposed Structures: Fat Layer (Subcutaneous Tissue): Fat Layer (Subcutaneous Tissue): Fascia: No Yes Yes Fat Layer (Subcutaneous Tissue): Fascia: No Fascia: No No Tendon: No Tendon: No Tendon: No Muscle: No Muscle: No Muscle: No Joint: No Joint: No Joint: No Bone: No Bone: No Bone: No Epithelialization: None None N/A Wound Number: 4 5 6  Photos: Wound Location: Right Toe Great Right, Medial Toe Second Right, Lateral Toe Second Wounding Event: Gradually Appeared Gradually Appeared Gradually Appeared 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 Comorbid History: Hypertension Hypertension Hypertension Date Acquired: 10/06/2020 10/06/2020 10/06/2020 Weeks of Treatment: 3 3 3  Wound Status: Open Open Open Pending Amputation on Yes Yes Yes Presentation: Measurements L x W x D (cm) 2x3x0.2 2x2x0.2 2.5x1x0.2 Area (cm) : 4.712 3.142  1.963 Volume (cm) : 0.942 0.628 0.393 % Reduction in Area: -48.10% -5.30% 0.00% % Reduction in Volume: -48.10% -5.20% 0.00% Classification: Full Thickness Without Exposed Full Thickness Without Exposed Full Thickness Without Exposed Support Structures Support Structures Support Structures Exudate Amount: Medium Medium Medium Exudate Type: Purulent Purulent Purulent Exudate Color: yellow, brown, green yellow, brown, green yellow, brown, green Granulation Amount: None Present (0%) None Present (0%) None Present (0%) Granulation Quality: N/A N/A N/A Necrotic Amount: Large (67-100%) Large (67-100%) Large (67-100%) Necrotic Tissue: Eschar, Adherent Slough Eschar, Adherent Slough Eschar, Adherent Slough Exposed Structures: Fascia: No Fascia: No Fat Layer (Subcutaneous Tissue): Fat Layer (Subcutaneous  Tissue): Fat Layer (Subcutaneous Tissue): Yes No No Fascia: No Tendon: No Tendon: No Tendon: No Muscle: No Muscle: No Muscle: No Joint: No Joint: No Joint: No Bone: No Bone: No Bone: No Dedic, Dieter L. 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 Comorbid History: Hypertension Hypertension Hypertension Date Acquired: 10/06/2020 10/06/2020 10/06/2020 Weeks of Treatment: Wound Status: Open Open Open Pending Amputation on Yes Yes Yes Presentation: Measurements L x W x D (cm) 2.5x1x0.2 1x1x0.2 1x1.2x0.1 Area (cm) : 1.963 0.785 0.942 Volume (cm) : 0.393 0.157 0.094 % Reduction in Area: -35.80% 30.60% 0.00% % Reduction in Volume: -36.00% 30.50% 0.00% Classification: Full Thickness Without Exposed Full Thickness Without Exposed Full Thickness Without Exposed Support Structures Support Structures Support Structures Exudate Amount: Medium Medium Medium Exudate Type: Purulent Purulent Purulent Exudate Color: yellow, brown, green yellow, brown, green yellow, brown, green Granulation Amount: None Present (0%) Small (1-33%) None Present (0%) Granulation Quality: N/A Pink N/A Necrotic Amount: Large (67-100%) Large (67-100%) Large (67-100%) Necrotic Tissue: Eschar, Adherent Slough Eschar, Adherent Slough Eschar, Adherent Slough Exposed Structures: Fat Layer (Subcutaneous Tissue): Fat Layer (Subcutaneous Tissue): Fat Layer (Subcutaneous Tissue): Yes Yes No Fascia: No Fascia: No Tendon: No Tendon: No Muscle: No Muscle: No Joint: No Joint: No Bone: No Bone: No Epithelialization: None None None Treatment Notes Electronic Signature(s) Signed: 10/28/2020 5:06:20 PM By: Yevonne Pax  RN Entered By: Yevonne Pax on 10/28/2020 13:17:26 Hermann, Brad Cleveland ([id number]) -------------------------------------------------------------------------------- Multi-Disciplinary Care Plan Details Patient Name: Brad Frost. Date of Service: 10/28/2020 12:30 PM Medical Record Number: [id number] Patient Account Number: [id number] Date of Birth/Sex: 1955-11-24 (65 y.o. M) Treating RN: Yevonne Pax Primary Care Jameal Razzano: [name], [name] Other Clinician: Referring Kailly Richoux: [name], [name] Treating Amie Cowens/Extender: Rowan Blase in Treatment: 3 Active Inactive Electronic Signature(s) Signed: 12/09/2020 9:21:35 AM By: Elliot Gurney, BSN, RN, CWS, Kim RN, BSN Signed: 01/07/2021 7:55:47 AM By: Yevonne Pax RN Previous Signature: 12/09/2020 9:20:57 AM Version By: Elliot Gurney BSN, RN, CWS, Kim RN, BSN Previous Signature: 10/28/2020 5:06:20 PM Version By: Yevonne Pax RN Entered By: Elliot Gurney, BSN, RN, CWS, Kim on 12/09/2020 09:21:35 Hoots, Brad Cleveland ([id number]) -------------------------------------------------------------------------------- Pain Assessment Details Patient Name: NYLE, LIMB. Date of Service: 10/28/2020 12:30 PM Medical Record Number: [id number] Patient Account Number: [id number] Date of Birth/Sex: November 09, 1955 (65 y.o. M) Treating RN: Yevonne Pax Primary Care Mcguire Gasparyan: [name], [name] Other Clinician: Referring Kaevion Sinclair: [name], [name] Treating Taija Mathias/Extender: Rowan Blase in Treatment: 3 Active Problems Location of Pain Severity and Description of Pain Patient Has Paino No Site Locations Pain Management and Medication Current Pain Management: Electronic Signature(s) Signed: 10/28/2020 5:06:20 PM By: Yevonne Pax RN Entered By: Yevonne Pax on  10/28/2020 12:55:25 Lotito, Brad Cleveland ([id number]) -------------------------------------------------------------------------------- Patient/Caregiver Education Details Patient Name: JAISHON, KRISHER. Date of Service: 10/28/2020 12:30  PM Medical Record Number: [id number] Patient Account Number: [id number] Date of Birth/Gender: 1955-11-01 (65 y.o. M) Treating RN: Yevonne Pax Primary Care Physician: [name], [name] Other Clinician: Referring Physician: [name], [name] Treating Physician/Extender: Rowan Blase in Treatment: 3 Education Assessment Education Provided To: Patient Education Topics Provided Wound/Skin Impairment: Methods: Explain/Verbal Responses: State content correctly Electronic Signature(s) Signed: 10/28/2020 5:06:20 PM By: Yevonne Pax RN Entered By: Yevonne Pax on 10/28/2020 13:33:27 Taflinger, Brad Cleveland ([id number]) -------------------------------------------------------------------------------- Wound Assessment Details Patient Name: Brad Frost. Date of Service: 10/28/2020 12:30 PM Medical Record Number: [id number] Patient Account Number: [id number] Date of Birth/Sex: [birth year] (65 y.o. M) Treating RN: Yevonne Pax Primary Care Rida Loudin: [name], [name] Other Clinician: Referring Breyah Akhter: [name], [name] Treating Alaney Witter/Extender: Rowan Blase in Treatment: 3 Wound Status Wound Number: 1 Primary Etiology: Venous Leg Ulcer Wound Location: Right, Posterior Lower Leg Wound Status: Open Wounding Event: Gradually Appeared Comorbid History: Hypertension Date Acquired: 10/07/2019 Weeks Of Treatment: 3 Clustered Wound: No Photos Wound Measurements Length: (cm) 3.3 Width: (cm) 2.7 Depth: (cm) 0.1 Area: (cm) 6.998 Volume: (cm) 0.7 % Reduction in Area: 91.9% % Reduction in Volume: 91.9% Epithelialization: Large (67-100%) Tunneling: No Undermining: No Wound Description Classification: Full Thickness Without Exposed Support Structu Exudate Amount: Medium Exudate Type: Serosanguineous Exudate Color: red, brown res Foul Odor After Cleansing: No Slough/Fibrino Yes Wound Bed Granulation Amount: Large (67-100%) Exposed Structure Granulation Quality: Red, Pink Fascia Exposed: No Necrotic  Amount: Small (1-33%) Fat Layer (Subcutaneous Tissue) Exposed: Yes Necrotic Quality: Eschar, Adherent Slough Tendon Exposed: No Muscle Exposed: No Joint Exposed: No Bone Exposed: No Electronic Signature(s) Signed: 10/28/2020 5:06:20 PM By: Yevonne Pax RN Entered By: Yevonne Pax on 10/28/2020 12:56:23 Deskins, Brad Cleveland ([id number]) -------------------------------------------------------------------------------- Wound Assessment Details Patient Name: Brad Frost. Date of Service: 10/28/2020 12:30 PM Medical Record Number: [id number] Patient Account Number: [id number] Date of Birth/Sex: 1955-10-29 (65 y.o. M) Treating RN: Yevonne Pax Primary Care Desaray Marschner: [name], [name] Other Clinician: Referring Argel Pablo: [name], [name] Treating Kayd Launer/Extender: Rowan Blase in Treatment: 3 Wound Status Wound Number: 10 Primary Etiology: Arterial Insufficiency Ulcer Wound Location: Right, Lateral Toe Fourth Wound Status: Open Wounding Event: Gradually Appeared Comorbid History: Hypertension Date Acquired: 10/06/2020 Weeks Of Treatment: 3 Clustered Wound: No Pending Amputation On Presentation Photos Wound Measurements Length: (cm) 1 Width: (cm) 0.8 Depth: (cm) 0.1 Area: (cm) 0.628 Volume: (cm) 0.063 % Reduction in Area: 0% % Reduction in Volume: 0% Epithelialization: None Tunneling: No Undermining: No Wound Description Classification: Full Thickness Without Exposed Support Structu Exudate Amount: Medium Exudate Type: Purulent Exudate Color: yellow, brown, green res Wound Bed Granulation Amount: Small (1-33%) Exposed Structure Granulation Quality: Pink Fascia Exposed: No Necrotic Amount: Large (67-100%) Fat Layer (Subcutaneous Tissue) Exposed: Yes Necrotic Quality: Adherent Slough Tendon Exposed: No Muscle Exposed: No Joint Exposed: No Bone Exposed: No Electronic Signature(s) Signed: 10/28/2020 5:06:20 PM By: Yevonne Pax RN Entered By: Yevonne Pax on 10/28/2020  12:59:34 Mccravy, Brad Cleveland ([id number]) -------------------------------------------------------------------------------- Wound Assessment Details Patient Name: Brad Frost. Date of Service: 10/28/2020 12:30 PM Medical Record Number: [id number] Patient Account Number: [id number] Date of Birth/Sex: 02/25/1956 (65 y.o. M) Treating RN: Yevonne Pax Primary Care Brandun Pinn: [name], [name] Other Clinician: Referring Lason Eveland: [name], [name] Treating Shuntavia Yerby/Extender: Rowan Blase in Treatment: 3 Wound Status Wound Number: 11 Primary Etiology: Arterial Insufficiency Ulcer Wound Location: Right, Medial Toe Fifth Wound Status: Open Wounding Event: Gradually Appeared Comorbid  History: Hypertension Date Acquired: 10/06/2020 Weeks Of Treatment: 3 Clustered Wound: No Pending Amputation On Presentation Photos Wound Measurements Length: (cm) 1.5 Width: (cm) 0.7 Depth: (cm) 0.1 Area: (cm) 0.825 Volume: (cm) 0.082 % Reduction in Area: 32.7% % Reduction in Volume: 33.3% Epithelialization: None Tunneling: No Undermining: No Wound Description Classification: Full Thickness Without Exposed Support Structures Exudate Amount: Medium Exudate Type: Serosanguineous Exudate Color: red, brown Foul Odor After Cleansing: No Slough/Fibrino Yes Wound Bed Granulation Amount: Small (1-33%) Exposed Structure Granulation Quality: Pink Fascia Exposed: No Necrotic Amount: Large (67-100%) Fat Layer (Subcutaneous Tissue) Exposed: Yes Necrotic Quality: Eschar, Adherent Slough Tendon Exposed: No Muscle Exposed: No Joint Exposed: No Bone Exposed: No Electronic Signature(s) Signed: 10/28/2020 5:06:20 PM By: Yevonne Pax RN Entered By: Yevonne Pax on 10/28/2020 12:58:39 Benthall, Brad Cleveland ([id number]) -------------------------------------------------------------------------------- Wound Assessment Details Patient Name: Brad Frost. Date of Service: 10/28/2020 12:30 PM Medical Record Number:  [id number] Patient Account Number: [id number] Date of Birth/Sex: [birth year] (65 y.o. M) Treating RN: Yevonne Pax Primary Care Valiant Dills: [name], [name] Other Clinician: Referring Deyonna Fitzsimmons: [name], [name] Treating Savanna Dooley/Extender: Rowan Blase in Treatment: 3 Wound Status Wound Number: 12 Primary Etiology: Arterial Insufficiency Ulcer Wound Location: Right, Lateral Toe Fifth Wound Status: Open Wounding Event: Gradually Appeared Comorbid History: Hypertension Date Acquired: 10/06/2020 Weeks Of Treatment: 3 Clustered Wound: No Pending Amputation On Presentation Photos Wound Measurements Length: (cm) 0.6 Width: (cm) 1.6 Depth: (cm) 0.1 Area: (cm) 0.754 Volume: (cm) 0.075 % Reduction in Area: -20.1% % Reduction in Volume: -19% Epithelialization: None Tunneling: No Undermining: No Wound Description Classification: Full Thickness Without Exposed Support Structures Exudate Amount: Medium Exudate Type: Serosanguineous Exudate Color: red, brown Foul Odor After Cleansing: No Slough/Fibrino Yes Wound Bed Granulation Amount: None Present (0%) Exposed Structure Necrotic Amount: Small (1-33%) Fascia Exposed: No Necrotic Quality: Eschar, Adherent Slough Fat Layer (Subcutaneous Tissue) Exposed: Yes Tendon Exposed: No Muscle Exposed: No Joint Exposed: No Bone Exposed: No Electronic Signature(s) Signed: 10/28/2020 5:06:20 PM By: Yevonne Pax RN Entered By: Yevonne Pax on 10/28/2020 12:57:41 Accomando, Brad Cleveland ([id number]) -------------------------------------------------------------------------------- Wound Assessment Details Patient Name: Brad Frost. Date of Service: 10/28/2020 12:30 PM Medical Record Number: [id number] Patient Account Number: [id number] Date of Birth/Sex: 25-Feb-1956 (65 y.o. 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, Dorsal Foot Wound Status: Open Wounding Event: Gradually Appeared Comorbid History: Hypertension Date Acquired: 10/07/2019 Weeks Of Treatment: 3 Clustered Wound: No Photos Wound Measurements Length: (cm) 6.5 Width: (cm) 6 Depth: (cm) 0.2 Area: (cm) 30.631 Volume: (cm) 6.126 % Reduction in Area: 0% % Reduction in Volume: 0% Epithelialization: None Tunneling: No Undermining: No Wound Description Classification: Full Thickness Without Exposed Support Structures Exudate Amount: None Present Foul Odor After Cleansing: No Slough/Fibrino Yes Wound Bed Granulation Amount: Small (1-33%) Exposed Structure Granulation Quality: Pink Fascia Exposed: No Necrotic Amount: Large (67-100%) Fat Layer (Subcutaneous Tissue) Exposed: Yes Necrotic Quality: Eschar, Adherent Slough Tendon Exposed: No Muscle Exposed: No Joint Exposed: No Bone Exposed: No Electronic Signature(s) Signed: 10/28/2020 5:06:20 PM By: Yevonne Pax RN Entered By: Yevonne Pax on 10/28/2020 13:08:47 Tumlin, Brad Cleveland ([id number]) -------------------------------------------------------------------------------- Wound Assessment Details Patient Name: Brad Frost. Date of Service: 10/28/2020 12:30 PM Medical Record Number: [id number] Patient Account Number: [id number] Date of Birth/Sex: 24-Dec-1955 (65 y.o. M) Treating RN: Yevonne Pax Primary Care Willem Klingensmith: [name], [name] Other Clinician: Referring Raylan Hanton:  Azucena Kuba, [name] Treating Anavey Coombes/Extender: Allen Derry Weeks in Treatment: 3 Wound Status Wound Number: 3 Primary Etiology: Pressure Ulcer Wound Location: Right Calcaneus Wound Status: Open Wounding Event: Pressure Injury Comorbid History: Hypertension Date Acquired: 10/06/2020 Weeks Of Treatment: 3 Clustered Wound: No Photos Wound Measurements Length: (cm) 8 Width: (cm) 7.5 Depth: (cm) 0.2 Area: (cm)  47.124 Volume: (cm) 9.425 % Reduction in Area: -55.1% % Reduction in Volume: -210.1% Tunneling: No Undermining: No Wound Description Classification: Unstageable/Unclassified Exudate Amount: Small Exudate Type: Serosanguineous Exudate Color: red, brown Foul Odor After Cleansing: No Slough/Fibrino Yes Wound Bed Granulation Amount: None Present (0%) Exposed Structure Necrotic Amount: Large (67-100%) Fascia Exposed: No Necrotic Quality: Eschar, Adherent Slough Fat Layer (Subcutaneous Tissue) Exposed: No Tendon Exposed: No Muscle Exposed: No Joint Exposed: No Bone Exposed: No Electronic Signature(s) Signed: 10/28/2020 5:06:20 PM By: Yevonne Pax RN Entered By: Yevonne Pax on 10/28/2020 13:07:01 Pyles, Brad Cleveland ([id number]) -------------------------------------------------------------------------------- Wound Assessment Details Patient Name: Brad Frost. Date of Service: 10/28/2020 12:30 PM Medical Record Number: [id number] Patient Account Number: [id number] Date of Birth/Sex: 10-23-1955 (65 y.o. M) Treating RN: Yevonne Pax Primary Care Lidwina Kaner: [name], [name] Other Clinician: Referring Aryani Daffern: [name], [name] Treating Korie Brabson/Extender: Rowan Blase in Treatment: 3 Wound Status Wound Number: 4 Primary Etiology: Arterial Insufficiency Ulcer Wound Location: Right Toe Great Wound Status: Open Wounding Event: Gradually Appeared Comorbid History: Hypertension Date Acquired: 10/06/2020 Weeks Of Treatment: 3 Clustered Wound: No Pending Amputation On Presentation Photos Wound Measurements Length: (cm) 2 Width: (cm) 3 Depth: (cm) 0.2 Area: (cm) 4.712 Volume: (cm) 0.942 % Reduction in Area: -48.1% % Reduction in Volume: -48.1% Epithelialization: None Tunneling: No Undermining: No Wound Description Classification: Full Thickness Without Exposed Support Structu Exudate Amount: Medium Exudate Type: Purulent Exudate Color: yellow, brown, green res Foul Odor  After Cleansing: No Slough/Fibrino Yes Wound Bed Granulation Amount: None Present (0%) Exposed Structure Necrotic Amount: Large (67-100%) Fascia Exposed: No Necrotic Quality: Eschar, Adherent Slough Fat Layer (Subcutaneous Tissue) Exposed: No Tendon Exposed: No Muscle Exposed: No Joint Exposed: No Bone Exposed: No Electronic Signature(s) Signed: 10/28/2020 5:06:20 PM By: Yevonne Pax RN Entered By: Yevonne Pax on 10/28/2020 13:05:55 Loge, Brad Cleveland ([id number]) -------------------------------------------------------------------------------- Wound Assessment Details Patient Name: Brad Frost. Date of Service: 10/28/2020 12:30 PM Medical Record Number: [id number] Patient Account Number: [id number] Date of Birth/Sex: Sep 20, 1955 (65 y.o. M) Treating RN: Yevonne Pax Primary Care Uchechi Denison: [name], [name] Other Clinician: Referring Monzerrat Wellen: [name], [name] Treating Kynzley Dowson/Extender: Rowan Blase in Treatment: 3 Wound Status Wound Number: 5 Primary Etiology: Arterial Insufficiency Ulcer Wound Location: Right, Medial Toe Second Wound Status: Open Wounding Event: Gradually Appeared Comorbid History: Hypertension Date Acquired: 10/06/2020 Weeks Of Treatment: 3 Clustered Wound: No Pending Amputation On Presentation Photos Wound Measurements Length: (cm) 2 Width: (cm) 2 Depth: (cm) 0.2 Area: (cm) 3.142 Volume: (cm) 0.628 % Reduction in Area: -5.3% % Reduction in Volume: -5.2% Epithelialization: None Tunneling: No Undermining: No Wound Description Classification: Full Thickness Without Exposed Support Structu Exudate Amount: Medium Exudate Type: Purulent Exudate Color: yellow, brown, green res Foul Odor After Cleansing: No Slough/Fibrino Yes Wound Bed Granulation Amount: None Present (0%) Exposed Structure Necrotic Amount: Large (67-100%) Fascia Exposed: No Necrotic Quality: Eschar, Adherent Slough Fat Layer (Subcutaneous Tissue) Exposed: No Tendon Exposed:  No Muscle Exposed: No Joint Exposed: No Bone Exposed: No Electronic Signature(s) Signed: 10/28/2020 5:06:20 PM By: Yevonne Pax RN Entered By: Yevonne Pax on 10/28/2020 13:05:03 Zbikowski, Brad Cleveland ([id number]) -------------------------------------------------------------------------------- Wound Assessment Details Patient Name: Brad Frost. Date of Service:  10/28/2020 12:30 PM Medical Record Number: [id number] Patient Account Number: [id number] Date of Birth/Sex: 09-27-1955 (65 y.o. M) Treating RN: Yevonne Pax Primary Care Zaina Jenkin: [name], [name] Other Clinician: Referring Olanda Downie: [name], [name] Treating Keniyah Gelinas/Extender: Rowan Blase in Treatment: 3 Wound Status Wound Number: 6 Primary Etiology: Arterial Insufficiency Ulcer Wound Location: Right, Lateral Toe Second Wound Status: Open Wounding Event: Gradually Appeared Comorbid History: Hypertension Date Acquired: 10/06/2020 Weeks Of Treatment: 3 Clustered Wound: No Pending Amputation On Presentation Photos Wound Measurements Length: (cm) 2.5 Width: (cm) 1 Depth: (cm) 0.2 Area: (cm) 1.963 Volume: (cm) 0.393 % Reduction in Area: 0% % Reduction in Volume: 0% Epithelialization: None Tunneling: No Undermining: No Wound Description Classification: Full Thickness Without Exposed Support Structu Exudate Amount: Medium Exudate Type: Purulent Exudate Color: yellow, brown, green res Foul Odor After Cleansing: No Slough/Fibrino Yes Wound Bed Granulation Amount: None Present (0%) Exposed Structure Necrotic Amount: Large (67-100%) Fascia Exposed: No Necrotic Quality: Eschar, Adherent Slough Fat Layer (Subcutaneous Tissue) Exposed: Yes Tendon Exposed: No Muscle Exposed: No Joint Exposed: No Bone Exposed: No Electronic Signature(s) Signed: 10/28/2020 5:06:20 PM By: Yevonne Pax RN Entered By: Yevonne Pax on 10/28/2020 13:03:59 Oncale, Brad Cleveland  ([id number]) -------------------------------------------------------------------------------- Wound Assessment Details Patient Name: Brad Frost. Date of Service: 10/28/2020 12:30 PM Medical Record Number: [id number] Patient Account Number: [id number] Date of Birth/Sex: [birth year] (65 y.o. M) Treating RN: Yevonne Pax Primary Care Dennisha Mouser: [name], [name] Other Clinician: Referring Auriella Wieand: [name], [name] Treating Zorian Gunderman/Extender: Rowan Blase in Treatment: 3 Wound Status Wound Number: 7 Primary Etiology: Arterial Insufficiency Ulcer Wound Location: Right, Medial Toe Third Wound Status: Open Wounding Event: Gradually Appeared Comorbid History: Hypertension Date Acquired: 10/06/2020 Weeks Of Treatment: 3 Clustered Wound: No Pending Amputation On Presentation Photos Wound Measurements Length: (cm) 2.5 Width: (cm) 1 Depth: (cm) 0.2 Area: (cm) 1.963 Volume: (cm) 0.393 % Reduction in Area: -35.8% % Reduction in Volume: -36% Epithelialization: None Tunneling: No Undermining: No Wound Description Classification: Full Thickness Without Exposed Support Structures Exudate Amount: Medium Exudate Type: Purulent Exudate Color: yellow, brown, green Foul Odor After Cleansing: No Slough/Fibrino Yes Wound Bed Granulation Amount: None Present (0%) Exposed Structure Necrotic Amount: Large (67-100%) Fascia Exposed: No Necrotic Quality: Eschar, Adherent Slough Fat Layer (Subcutaneous Tissue) Exposed: Yes Tendon Exposed: No Muscle Exposed: No Joint Exposed: No Bone Exposed: No Electronic Signature(s) Signed: 10/28/2020 5:06:20 PM By: Yevonne Pax RN Entered By: Yevonne Pax on 10/28/2020 13:03:08 Modisette, Brad Cleveland ([id number]) -------------------------------------------------------------------------------- Wound Assessment Details Patient Name: Brad Frost. Date of Service: 10/28/2020 12:30 PM Medical Record Number: [id number] Patient Account Number: [id number] Date of  Birth/Sex: 12/25/1955 (65 y.o. M) Treating RN: Yevonne Pax Primary Care Adana Marik: [name], [name] Other Clinician: Referring Jhoana Upham: [name], [name] Treating Emmalou Hunger/Extender: Rowan Blase in Treatment: 3 Wound Status Wound Number: 8 Primary Etiology: Arterial Insufficiency Ulcer Wound Location: Right Toe Third Wound Status: Open Wounding Event: Gradually Appeared Comorbid History: Hypertension Date Acquired: 10/06/2020 Weeks Of Treatment: 3 Clustered Wound: No Pending Amputation On Presentation Photos Wound Measurements Length: (cm) 1 Width: (cm) 1 Depth: (cm) 0.2 Area: (cm) 0.785 Volume: (cm) 0.157 % Reduction in Area: 30.6% % Reduction in Volume: 30.5% Epithelialization: None Tunneling: No Undermining: No Wound Description Classification: Full Thickness Without Exposed Support Structu Exudate Amount: Medium Exudate Type: Purulent Exudate Color: yellow, brown, green res Foul Odor After Cleansing: No Slough/Fibrino Yes Wound Bed Granulation Amount: Small (1-33%) Exposed Structure Granulation Quality: Pink Fascia Exposed: No Necrotic Amount: Large (67-100%) Fat Layer (Subcutaneous Tissue) Exposed: Yes Necrotic Quality: Eschar,  Adherent Slough Tendon Exposed: No Muscle Exposed: No Joint Exposed: No Bone Exposed: No Electronic Signature(s) Signed: 10/28/2020 5:06:20 PM By: Yevonne Pax RN Entered By: Yevonne Pax on 10/28/2020 13:02:21 Ralston, Brad Cleveland ([id number]) -------------------------------------------------------------------------------- Wound Assessment Details Patient Name: Brad Frost. Date of Service: 10/28/2020 12:30 PM Medical Record Number: [id number] Patient Account Number: [id number] Date of Birth/Sex: Oct 22, 1955 (65 y.o. M) Treating RN: Yevonne Pax Primary Care Oather Muilenburg: [name], [name] Other Clinician: Referring Ebonie Westerlund: [name], [name] Treating Jayjay Littles/Extender: Rowan Blase in Treatment: 3 Wound Status Wound Number: 9 Primary  Etiology: Arterial Insufficiency Ulcer Wound Location: Right, Medial Toe Fourth Wound Status: Open Wounding Event: Gradually Appeared Comorbid History: Hypertension Date Acquired: 10/06/2020 Weeks Of Treatment: 3 Clustered Wound: No Pending Amputation On Presentation Photos Wound Measurements Length: (cm) 1 Width: (cm) 1.2 Depth: (cm) 0.1 Area: (cm) 0.942 Volume: (cm) 0.094 % Reduction in Area: 0% % Reduction in Volume: 0% Epithelialization: None Tunneling: No Undermining: No Wound Description Classification: Full Thickness Without Exposed Support Structu Exudate Amount: Medium Exudate Type: Purulent Exudate Color: yellow, brown, green res Foul Odor After Cleansing: No Slough/Fibrino Yes Wound Bed Granulation Amount: None Present (0%) Exposed Structure Necrotic Amount: Large (67-100%) Fat Layer (Subcutaneous Tissue) Exposed: No Necrotic Quality: Eschar, Adherent Slough Electronic Signature(s) Signed: 10/28/2020 5:06:20 PM By: Yevonne Pax RN Entered By: Yevonne Pax on 10/28/2020 13:01:19 Kann, Brad Cleveland ([id number]) -------------------------------------------------------------------------------- Vitals Details Patient Name: Brad Frost. Date of Service: 10/28/2020 12:30 PM Medical Record Number: [id number] Patient Account Number: [id number] Date of Birth/Sex: [birth year] (65 y.o. M) Treating RN: Yevonne Pax Primary Care Trevaris Pennella: [name], [name] Other Clinician: Referring Dasia Guerrier: [name], [name] Treating Adria Costley/Extender: Rowan Blase in Treatment: 3 Vital Signs Time Taken: 12:55 Temperature (F): 98.1 Height (in): 70 Pulse (bpm): 89 Weight (lbs): 172 Respiratory Rate (breaths/min): 18 Body Mass Index (BMI): 24.7 Blood Pressure (mmHg): 103/65 Reference Range: 80 - 120 mg / dl Electronic Signature(s) Signed: 10/28/2020 5:06:20 PM By: Yevonne Pax RN Entered By: Yevonne Pax on 10/28/2020 12:55:18

## 2020-10-28 NOTE — Progress Notes (Addendum)
Brad, Frost ([id number]) Visit Report for 10/28/2020 Chief Complaint Document Details Patient Name: Brad Frost, Brad Frost. Date of Service: 10/28/2020 12:30 PM Medical Record Number: [id number] Patient Account Number: [id number] Date of Birth/Sex: 09-20-1955 (65 y.o. M) Treating RN: Yevonne Pax Primary Care Provider: [name], [name] Other Clinician: Referring Provider: Azucena Kuba, [name] Treating Provider/Extender: Rowan Blase in Treatment: 3 Information Obtained from: Patient Chief Complaint Right LE Ulcers with Arterial Insufficiency Electronic Signature(s) Signed: 10/28/2020 12:46:18 PM By: Lenda Kelp PA-C Entered By: Lenda Kelp on 10/28/2020 12:46:18 Maddux, Dollene Cleveland ([id number]) -------------------------------------------------------------------------------- HPI Details Patient Name: Brad Frost. Date of Service: 10/28/2020 12:30 PM Medical Record Number: [id number] Patient Account Number: [id number] Date of Birth/Sex: [birth year] (65 y.o. M) Treating RN: Yevonne Pax Primary Care Provider: [name], [name] Other Clinician: Referring Provider: Azucena Kuba, [name] Treating Provider/Extender: Rowan Blase in Treatment: 3 History of Present Illness HPI Description: 10/06/2020 upon evaluation today patient appears to be doing somewhat poorly in regard to his foot ulcer unfortunately. He has been tolerating the dressing changes without complication. Fortunately there does not appear to be any evidence of active infection which is great news. No fevers, chills, nausea, vomiting, or diarrhea. With that being said he does unfortunately have significant peripheral vascular disease. He does have a vascular surgeon, Dr. Gilda Crease who has been taking care of him. I did review his note from [facility] from 09/19/2020 and this was an abdominal aortogram. Subsequently the patient did have some vascular work to try to improve blood flow. There was comment on whether or not he could  undergo a repeat bypass surgery as he previously had one of the side. Subsequently it was suggested that this would not be something the patient would actually tolerate and therefore it is not advised that is the right way to go at this point. With that being said the patient does seem to be doing okay from the standpoint of infection currently but nonetheless I am very concerned about the fact that if things do not continue to improve he may actually be looking at above-knee amputation which is what Dr. Gilda Crease has alluded to as well to be honest. With that being said I did review the patient's wounds with him today I explained what the critical nature of the peripheral vascular disease diagnosis was in this case and the fact that if we cannot get the wounds to heal he would be looking at an above-knee amputation he voiced understanding. The patient is on Eliquis which appears to be as a result of a stent being placed try to keep this patent and open. With that being said even so I am not certain whether or not he is going to be able to actually heal this wound. I think that there is a chance for healing but also a high likelihood that this may be a very difficult prospect. Unfortunately it was noted as well that the patient did have maggots in around the wounds on his toes upon evaluation today. Obviously this is something that we did attempt to addressed as much as possible today although again there is no guarantee that everything was completely removed and these were very tiny so it also may be that others hatch in the interim between now and when we were to see him back. The patient was somewhat of a poor historian and did not come with any paperwork from the facility so I did not have any further information on him to be perfectly  honest. I do know that however he is supposed to be on the Eliquis at this point. He does have chronic venous insufficiency noted as well as lymphedema. 10/28/2020  upon evaluation today patient appears to be doing okay in regard to his wounds. With that being said unfortunately does appear that his dressing has been on longer than just a day based on what I am seeing he has a tremendous amount of Betadine which appears to have been used on the gauze overlying the wound and if this literally had been just changed within the last 24 hours this would still be wet with the amount of Betadine that I see present currently. To that end I feel like that this might not of been changed very well could have been due to agency nursing staff over the holiday weekend but again either way I did feel like it was important to reach out to the facility. Actually did speak with the director of nursing today. The patient did also come out with just his shirt and a gown over him with no pants although he did have underwear on. Nonetheless this to counter bothers me a little bit although again I am not exactly sure the reasoning behind this I did just want overlay it to the director of nursing so she could look into it and address any concerns as necessary. Other than this overall the patient does appear to be doing quite well in general all things considered he still has several areas of necrotic tissue but again nothing too tremendous at this point. He does have evidence however of blue-green drainage which has me concerned about the possibility of Pseudomonas I discussed that with him today I do believe he is probably going need to be started on Levaquin I discussed that with the director of nursing as well. Electronic Signature(s) Signed: 10/28/2020 3:06:47 PM By: Lenda Kelp PA-C Entered By: Lenda Kelp on 10/28/2020 15:06:47 Rathel, Dollene Cleveland ([id number]) -------------------------------------------------------------------------------- Physical Exam Details Patient Name: Brad Frost Date of Service: 10/28/2020 12:30 PM Medical Record Number: [id number] Patient  Account Number: [id number] Date of Birth/Sex: June 05, 1955 (65 y.o. M) Treating RN: Yevonne Pax Primary Care Provider: [name], [name] Other Clinician: Referring Provider: [name], [name] Treating Provider/Extender: Rowan Blase in Treatment: 3 Constitutional Well-nourished and well-hydrated in no acute distress. Respiratory normal breathing without difficulty. Psychiatric this patient is able to make decisions and demonstrates good insight into disease process. Alert and Oriented x 3. pleasant and cooperative. Notes Patient's wounds for the most part did showed signs of continued issues with eschar noted over the feet, heels, and toes. With that being said I do believe Betadine is still appropriate we just need to make sure not to overdo this and he should have gauze between each of the toes as well. Of note I am going also recommend based on what I am seeing that we have the patient go ahead and continue with the alginate to the leg although that was noted to be done previously and honestly the patient came in with no alginate on the leg today. Again I am not exactly sure what happened in that regard. Electronic Signature(s) Signed: 10/28/2020 3:07:41 PM By: Lenda Kelp PA-C Entered By: Lenda Kelp on 10/28/2020 15:07:41 Simoneau, Dollene Cleveland ([id number]) -------------------------------------------------------------------------------- Physician Orders Details Patient Name: Brad Frost Date of Service: 10/28/2020 12:30 PM Medical Record Number: [id number] Patient Account Number: [id number] Date of Birth/Sex: [birth year] (65 y.o. M) Treating RN: Yevonne Pax  Primary Care Provider: Azucena Kuba, [name] Other Clinician: Referring Provider: Azucena Kuba, [name] Treating Provider/Extender: Rowan Blase in Treatment: 3 Verbal / Phone Orders: No Diagnosis Coding ICD-10 Coding Code Description I73.89 Other specified peripheral vascular diseases I87.2 Venous insufficiency (chronic)  (peripheral) L97.512 Non-pressure chronic ulcer of other part of right foot with fat layer exposed L97.812 Non-pressure chronic ulcer of other part of right lower leg with fat layer exposed L89.610 Pressure ulcer of right heel, unstageable Z79.01 Long term (current) use of anticoagulants Follow-up Appointments o Return Appointment in 1 week. Bathing/ Shower/ Hygiene o Wash wounds with antibacterial soap and water. o May shower; gently cleanse wound with antibacterial soap, rinse and pat dry prior to dressing wounds - always change dressings immediately after shower o No tub bath. Edema Control - Lymphedema / Segmental Compressive Device / Other o Elevate legs to the level of the heart and pump ankles as often as possible o DO YOUR BEST to sleep in the bed at night. DO NOT sleep in your recliner. Long hours of sitting in a recliner leads to swelling of the legs and/or potential wounds on your backside. Off-Loading Bilateral Lower Extremities o Open toe surgical shoe Additional Orders / Instructions o Follow Nutritious Diet and Increase Protein Intake o Other: - Send current med list to wound care appt at [facility] VISIT; FOLLOW DAILY DRESSING CHANGES-SEE ORDERS Wound Treatment Wound #1 - Lower Leg Wound Laterality: Right, Posterior Cleanser: Normal Saline 1 x Per Day/30 Days Discharge Instructions: Wash your hands with soap and water. Remove old dressing, discard into plastic bag and place into trash. Cleanse the wound with Normal Saline prior to applying a clean dressing using gauze sponges, not tissues or cotton balls. Do not scrub or use excessive force. Pat dry using gauze sponges, not tissue or cotton balls. Primary Dressing: Silvercel 4 1/4x 4 1/4 (in/in) 1 x Per Day/30 Days Discharge Instructions: Apply Silvercel 4 1/4x 4 1/4 (in/in) as instructed Secondary Dressing: ABD Pad 5x9 (in/in) 1 x Per Day/30 Days Discharge Instructions: Cover with ABD pad Secondary  Dressing: Kerlix 4.5 x 4.1 (in/yd) 1 x Per Day/30 Days Discharge Instructions: LIGHTLY COVER WITH Kerlix TO SECURE OVER SILVER ALGINATE 4.5 x 4.1 (in/yd) as instructed Secured With: 26M Medipore H Soft Cloth Surgical Tape, 2x2 (in/yd) 1 x Per Day/30 Days Discharge Instructions: TO SECURE KERLIX Wound #10 - Toe Fourth Wound Laterality: Right, Lateral Cleanser: Betadine-swabs 1 x Per Day/30 Days Discharge Instructions: Apply Betadine as directed ENOCHStevon, Gough ([id number]) Cleanser: Normal Saline 1 x Per Day/30 Days Discharge Instructions: Wash your hands with soap and water. Remove old dressing, discard into plastic bag and place into trash. Cleanse the wound with Normal Saline prior to applying a clean dressing using gauze sponges, not tissues or cotton balls. Do not scrub or use excessive force. Pat dry using gauze sponges, not tissue or cotton balls. Primary Dressing: Gauze 1 x Per Day/30 Days Discharge Instructions: betadine moist gauze between toes Secondary Dressing: ABD Pad 5x9 (in/in) 1 x Per Day/30 Days Discharge Instructions: Cover with ABD pad FOR PADDING/COVERAGE Secondary Dressing: Kerlix 4.5 x 4.1 (in/yd) 1 x Per Day/30 Days Discharge Instructions: LIGHTLY Apply Kerlix 4.5 x 4.1 (in/yd) as instructed Secured With: 26M Medipore H Soft Cloth Surgical Tape, 2x2 (in/yd) 1 x Per Day/30 Days Discharge Instructions: TO HOLD KERLIX Wound #11 - Toe Fifth Wound Laterality: Right, Medial Cleanser: Betadine-swabs 1 x Per Day/30 Days Discharge Instructions: Apply Betadine as directed Cleanser: Normal Saline 1 x Per Day/30  Days Discharge Instructions: Wash your hands with soap and water. Remove old dressing, discard into plastic bag and place into trash. Cleanse the wound with Normal Saline prior to applying a clean dressing using gauze sponges, not tissues or cotton balls. Do not scrub or use excessive force. Pat dry using gauze sponges, not tissue or cotton balls. Primary Dressing:  Gauze 1 x Per Day/30 Days Discharge Instructions: betadine moist gauze between toes Secondary Dressing: ABD Pad 5x9 (in/in) 1 x Per Day/30 Days Discharge Instructions: Cover with ABD pad FOR PADDING/COVERAGE Secondary Dressing: Kerlix 4.5 x 4.1 (in/yd) 1 x Per Day/30 Days Discharge Instructions: LIGHTLY Apply Kerlix 4.5 x 4.1 (in/yd) as instructed Secured With: 52M Medipore H Soft Cloth Surgical Tape, 2x2 (in/yd) 1 x Per Day/30 Days Discharge Instructions: TO HOLD KERLIX Wound #12 - Toe Fifth Wound Laterality: Right, Lateral Cleanser: Betadine-swabs 1 x Per Day/30 Days Discharge Instructions: Apply Betadine as directed Cleanser: Normal Saline 1 x Per Day/30 Days Discharge Instructions: Wash your hands with soap and water. Remove old dressing, discard into plastic bag and place into trash. Cleanse the wound with Normal Saline prior to applying a clean dressing using gauze sponges, not tissues or cotton balls. Do not scrub or use excessive force. Pat dry using gauze sponges, not tissue or cotton balls. Primary Dressing: Gauze 1 x Per Day/30 Days Discharge Instructions: betadine moist gauze between toes Secondary Dressing: ABD Pad 5x9 (in/in) 1 x Per Day/30 Days Discharge Instructions: Cover with ABD pad FOR PADDING/COVERAGE Secondary Dressing: Kerlix 4.5 x 4.1 (in/yd) 1 x Per Day/30 Days Discharge Instructions: LIGHTLY Apply Kerlix 4.5 x 4.1 (in/yd) as instructed Secured With: 52M Medipore H Soft Cloth Surgical Tape, 2x2 (in/yd) 1 x Per Day/30 Days Discharge Instructions: TO HOLD KERLIX Wound #2 - Foot Wound Laterality: Dorsal, Right Cleanser: Betadine-swabs 1 x Per Day/30 Days Discharge Instructions: Apply Betadine as directed Cleanser: Normal Saline 1 x Per Day/30 Days ENOCHBradley, Bostelman ([id number]) Discharge Instructions: Wash your hands with soap and water. Remove old dressing, discard into plastic bag and place into trash. Cleanse the wound with Normal Saline prior to applying a clean  dressing using gauze sponges, not tissues or cotton balls. Do not scrub or use excessive force. Pat dry using gauze sponges, not tissue or cotton balls. Primary Dressing: Gauze 1 x Per Day/30 Days Discharge Instructions: betadine moist gauze Secondary Dressing: ABD Pad 5x9 (in/in) 1 x Per Day/30 Days Discharge Instructions: Cover with ABD pad FOR PADDING/COVERAGE Secondary Dressing: Kerlix 4.5 x 4.1 (in/yd) 1 x Per Day/30 Days Discharge Instructions: LIGHTLY Apply Kerlix 4.5 x 4.1 (in/yd) as instructed Secured With: 52M Medipore H Soft Cloth Surgical Tape, 2x2 (in/yd) 1 x Per Day/30 Days Discharge Instructions: TO HOLD KERLIX Wound #3 - Calcaneus Wound Laterality: Right Cleanser: Betadine-swabs 1 x Per Day/30 Days Discharge Instructions: as directed Cleanser: Normal Saline 1 x Per Day/30 Days Discharge Instructions: Wash your hands with soap and water. Remove old dressing, discard into plastic bag and place into trash. Cleanse the wound with Normal Saline prior to applying a clean dressing using gauze sponges, not tissues or cotton balls. Do not scrub or use excessive force. Pat dry using gauze sponges, not tissue or cotton balls. Primary Dressing: Gauze 1 x Per Day/30 Days Discharge Instructions: apply betadine moist gauze Secondary Dressing: ABD Pad 5x9 (in/in) 1 x Per Day/30 Days Discharge Instructions: Cover with ABD pad FOR PADDING/COVERAGE Secondary Dressing: Kerlix 4.5 x 4.1 (in/yd) 1 x Per Day/30 Days Discharge Instructions: LIGHTLY  Apply Kerlix 4.5 x 4.1 (in/yd) as instructed Secured With: 4M Medipore H Soft Cloth Surgical Tape, 2x2 (in/yd) 1 x Per Day/30 Days Discharge Instructions: TO HOLD KERLIX Wound #4 - Toe Great Wound Laterality: Right Cleanser: Betadine-swabs 1 x Per Day/30 Days Discharge Instructions: Apply Betadine as directed Cleanser: Normal Saline 1 x Per Day/30 Days Discharge Instructions: Wash your hands with soap and water. Remove old dressing, discard into  plastic bag and place into trash. Cleanse the wound with Normal Saline prior to applying a clean dressing using gauze sponges, not tissues or cotton balls. Do not scrub or use excessive force. Pat dry using gauze sponges, not tissue or cotton balls. Primary Dressing: Gauze 1 x Per Day/30 Days Discharge Instructions: betadine moist gauze between toes Secondary Dressing: ABD Pad 5x9 (in/in) 1 x Per Day/30 Days Discharge Instructions: Cover with ABD pad FOR PADDING/COVERAGE Secondary Dressing: Kerlix 4.5 x 4.1 (in/yd) 1 x Per Day/30 Days Discharge Instructions: LIGHTLY Apply Kerlix 4.5 x 4.1 (in/yd) as instructed Secured With: 4M Medipore H Soft Cloth Surgical Tape, 2x2 (in/yd) 1 x Per Day/30 Days Discharge Instructions: TO HOLD KERLIX Wound #5 - Toe Second Wound Laterality: Right, Medial Cleanser: Betadine-swabs 1 x Per Day/30 Days Discharge Instructions: Apply Betadine as directed Cleanser: Normal Saline 1 x Per Day/30 Days Discharge Instructions: Wash your hands with soap and water. Remove old dressing, discard into plastic bag and place into trash. Cleanse the wound with Normal Saline prior to applying a clean dressing using gauze sponges, not tissues or cotton balls. Do not scrub or use excessive force. Pat dry using gauze sponges, not tissue or cotton balls. Gagen, ARACELI COUFAL ([id number]) Primary Dressing: Gauze 1 x Per Day/30 Days Discharge Instructions: betadine moist gauze between toes Secondary Dressing: ABD Pad 5x9 (in/in) 1 x Per Day/30 Days Discharge Instructions: Cover with ABD pad FOR PADDING/COVERAGE Secondary Dressing: Kerlix 4.5 x 4.1 (in/yd) 1 x Per Day/30 Days Discharge Instructions: LIGHTLY Apply Kerlix 4.5 x 4.1 (in/yd) as instructed Secured With: 4M Medipore H Soft Cloth Surgical Tape, 2x2 (in/yd) 1 x Per Day/30 Days Discharge Instructions: TO HOLD KERLIX Wound #6 - Toe Second Wound Laterality: Right, Lateral Cleanser: Betadine-swabs 1 x Per Day/30 Days Discharge  Instructions: Apply Betadine as directed Cleanser: Normal Saline 1 x Per Day/30 Days Discharge Instructions: Wash your hands with soap and water. Remove old dressing, discard into plastic bag and place into trash. Cleanse the wound with Normal Saline prior to applying a clean dressing using gauze sponges, not tissues or cotton balls. Do not scrub or use excessive force. Pat dry using gauze sponges, not tissue or cotton balls. Primary Dressing: Gauze 1 x Per Day/30 Days Discharge Instructions: betadine moist gauze between toes Secondary Dressing: ABD Pad 5x9 (in/in) 1 x Per Day/30 Days Discharge Instructions: Cover with ABD pad FOR PADDING/COVERAGE Secondary Dressing: Kerlix 4.5 x 4.1 (in/yd) 1 x Per Day/30 Days Discharge Instructions: LIGHTLY Apply Kerlix 4.5 x 4.1 (in/yd) as instructed Secured With: 4M Medipore H Soft Cloth Surgical Tape, 2x2 (in/yd) 1 x Per Day/30 Days Discharge Instructions: TO HOLD KERLIX Wound #7 - Toe Third Wound Laterality: Right, Medial Cleanser: Betadine-swabs 1 x Per Day/30 Days Discharge Instructions: Apply Betadine as directed Cleanser: Normal Saline 1 x Per Day/30 Days Discharge Instructions: Wash your hands with soap and water. Remove old dressing, discard into plastic bag and place into trash. Cleanse the wound with Normal Saline prior to applying a clean dressing using gauze sponges, not tissues or cotton balls. Do  not scrub or use excessive force. Pat dry using gauze sponges, not tissue or cotton balls. Primary Dressing: Gauze 1 x Per Day/30 Days Discharge Instructions: betadine moist gauze between toes Secondary Dressing: ABD Pad 5x9 (in/in) 1 x Per Day/30 Days Discharge Instructions: Cover with ABD pad FOR PADDING/COVERAGE Secondary Dressing: Kerlix 4.5 x 4.1 (in/yd) 1 x Per Day/30 Days Discharge Instructions: LIGHTLY Apply Kerlix 4.5 x 4.1 (in/yd) as instructed Secured With: 14M Medipore H Soft Cloth Surgical Tape, 2x2 (in/yd) 1 x Per Day/30  Days Discharge Instructions: TO HOLD KERLIX Wound #8 - Toe Third Wound Laterality: Right Cleanser: Betadine-swabs 1 x Per Day/30 Days Discharge Instructions: Apply Betadine as directed Cleanser: Normal Saline 1 x Per Day/30 Days Discharge Instructions: Wash your hands with soap and water. Remove old dressing, discard into plastic bag and place into trash. Cleanse the wound with Normal Saline prior to applying a clean dressing using gauze sponges, not tissues or cotton balls. Do not scrub or use excessive force. Pat dry using gauze sponges, not tissue or cotton balls. Primary Dressing: Gauze 1 x Per Day/30 Days Discharge Instructions: betadine moist gauze between toes Secondary Dressing: ABD Pad 5x9 (in/in) 1 x Per Day/30 Days ENOCHConcepcion, Kirkpatrick ([id number]) Discharge Instructions: Cover with ABD pad FOR PADDING/COVERAGE Secondary Dressing: Kerlix 4.5 x 4.1 (in/yd) 1 x Per Day/30 Days Discharge Instructions: LIGHTLY Apply Kerlix 4.5 x 4.1 (in/yd) as instructed Secured With: 14M Medipore H Soft Cloth Surgical Tape, 2x2 (in/yd) 1 x Per Day/30 Days Discharge Instructions: TO HOLD KERLIX Wound #9 - Toe Fourth Wound Laterality: Right, Medial Cleanser: Betadine-swabs 1 x Per Day/30 Days Discharge Instructions: Apply Betadine as directed Cleanser: Normal Saline 1 x Per Day/30 Days Discharge Instructions: Wash your hands with soap and water. Remove old dressing, discard into plastic bag and place into trash. Cleanse the wound with Normal Saline prior to applying a clean dressing using gauze sponges, not tissues or cotton balls. Do not scrub or use excessive force. Pat dry using gauze sponges, not tissue or cotton balls. Primary Dressing: Gauze 1 x Per Day/30 Days Discharge Instructions: betadine moist gauze between toes Secondary Dressing: ABD Pad 5x9 (in/in) 1 x Per Day/30 Days Discharge Instructions: Cover with ABD pad FOR PADDING/COVERAGE Secondary Dressing: Kerlix 4.5 x 4.1 (in/yd) 1 x Per  Day/30 Days Discharge Instructions: LIGHTLY Apply Kerlix 4.5 x 4.1 (in/yd) as instructed Secured With: 14M Medipore H Soft Cloth Surgical Tape, 2x2 (in/yd) 1 x Per Day/30 Days Discharge Instructions: TO HOLD KERLIX Laboratory o Bacteria identified in Wound by Culture (MICRO) - non healing wound with green purlent drainage , and odor - (ICD10 L97.512 - 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 Provider: Allen Derry PA-C Date of Birth: 02-07-1956 NPI#: [id number] Sex: Judie Petit DEA#: [id number] Phone #: [phone] License #: Patient Address: [facility] [address] [facility] [place], [place] [address] [address] [place], [place] [address] [phone] Allergies No Known Drug Allergies Medication Medication: Route: Strength: Form:  Levaquin oral 500 mg tablet Class: QUINOLONE ANTIBIOTICS Dose: Frequency / Time: Indication: 1 1 tablet oral taken 1 time per day for 14 days Number of Refills: Number of Units: 0 Fourteen (14) Tablet(s) Generic Substitution: Start Date: End Date: Administered at Facility: Substitution Permitted 10/28/2020 No Note to Pharmacy: Hand Signature: Date(s): Electronic Signature(s) Signed: 10/28/2020 4:57:45 PM By: Lenda Kelp PA-C Entered By: Lenda Kelp on 10/28/2020  15:10:41 Diop, Dollene Cleveland ([id number]) --------------------------------------------------------------------------------  Problem List Details Patient Name: Brad Frost. Date of Service: 10/28/2020 12:30 PM Medical Record Number: [id number] Patient Account Number: [id number] Date of Birth/Sex: 10-Mar-1956 (65 y.o. M) Treating RN: Yevonne Pax Primary Care Provider: [name], [name] Other Clinician: Referring Provider: Azucena Kuba, [name] Treating Provider/Extender: Rowan Blase in Treatment: 3 Active Problems ICD-10 Encounter Code Description Active Date MDM Diagnosis I73.89 Other specified peripheral vascular diseases 10/06/2020 No Yes I87.2 Venous insufficiency (chronic) (peripheral) 10/06/2020 No Yes L97.512 Non-pressure chronic ulcer of other part of right foot with fat layer 10/06/2020 No Yes exposed L97.812 Non-pressure chronic ulcer of other part of right lower leg with fat layer 10/06/2020 No Yes exposed L89.610 Pressure ulcer of right heel, unstageable 10/06/2020 No Yes Z79.01 Long term (current) use of anticoagulants 10/06/2020 No Yes Inactive Problems Resolved Problems Electronic Signature(s) Signed: 10/28/2020 12:46:12 PM By: Lenda Kelp PA-C Entered By: Lenda Kelp on 10/28/2020 12:46:11 Seelye, Dollene Cleveland ([id number]) -------------------------------------------------------------------------------- Progress Note Details Patient Name: Brad Frost. Date of Service: 10/28/2020 12:30 PM Medical Record Number: [id number] Patient Account Number: [id number] Date of Birth/Sex: Jul 23, 1955 (65 y.o. M) Treating RN: Yevonne Pax Primary Care Provider: [name], [name] Other Clinician: Referring Provider: Azucena Kuba, [name] Treating Provider/Extender: Rowan Blase in Treatment: 3 Subjective Chief Complaint Information obtained from Patient Right LE Ulcers with Arterial Insufficiency History of Present Illness (HPI) 10/06/2020 upon evaluation today patient appears to be doing  somewhat poorly in regard to his foot ulcer unfortunately. He has been tolerating the dressing changes without complication. Fortunately there does not appear to be any evidence of active infection which is great news. No fevers, chills, nausea, vomiting, or diarrhea. With that being said he does unfortunately have significant peripheral vascular disease. He does have a vascular surgeon, Dr. Gilda Crease who has been taking care of him. I did review his note from [facility] from 09/19/2020 and this was an abdominal aortogram. Subsequently the patient did have some vascular work to try to improve blood flow. There was comment on whether or not he could undergo a repeat bypass surgery as he previously had one of the side. Subsequently it was suggested that this would not be something the patient would actually tolerate and therefore it is not advised that is the right way to go at this point. With that being said the patient does seem to be doing okay from the standpoint of infection currently but nonetheless I am very concerned about the fact that if things do not continue to improve he may actually be looking at above-knee amputation which is what Dr. Gilda Crease has alluded to as well to be honest. With that being said I did review the patient's wounds with him today I explained what the critical nature of the peripheral vascular disease diagnosis was in this case and the fact that if we cannot get the wounds to heal he would be looking at an above-knee amputation he voiced understanding. The patient is on Eliquis which appears to be as a result of a stent being placed  try to keep this patent and open. With that being said even so I am not certain whether or not he is going to be able to actually heal this wound. I think that there is a chance for healing but also a high likelihood that this may be a very difficult prospect. Unfortunately it was noted as well that the patient did have maggots in  around the wounds on his toes upon evaluation today. Obviously this is something that we did attempt to addressed as much as possible today although again there is no guarantee that everything was completely removed and these were very tiny so it also may be that others hatch in the interim between now and when we were to see him back. The patient was somewhat of a poor historian and did not come with any paperwork from the facility so I did not have any further information on him to be perfectly honest. I do know that however he is supposed to be on the Eliquis at this point. He does have chronic venous insufficiency noted as well as lymphedema. 10/28/2020 upon evaluation today patient appears to be doing okay in regard to his wounds. With that being said unfortunately does appear that his dressing has been on longer than just a day based on what I am seeing he has a tremendous amount of Betadine which appears to have been used on the gauze overlying the wound and if this literally had been just changed within the last 24 hours this would still be wet with the amount of Betadine that I see present currently. To that end I feel like that this might not of been changed very well could have been due to agency nursing staff over the holiday weekend but again either way I did feel like it was important to reach out to the facility. Actually did speak with the director of nursing today. The patient did also come out with just his shirt and a gown over him with no pants although he did have underwear on. Nonetheless this to counter bothers me a little bit although again I am not exactly sure the reasoning behind this I did just want overlay it to the director of nursing so she could look into it and address any concerns as necessary. Other than this overall the patient does appear to be doing quite well in general all things considered he still has several areas of necrotic tissue but again nothing too  tremendous at this point. He does have evidence however of blue-green drainage which has me concerned about the possibility of Pseudomonas I discussed that with him today I do believe he is probably going need to be started on Levaquin I discussed that with the director of nursing as well. Objective Constitutional Well-nourished and well-hydrated in no acute distress. Vitals Time Taken: 12:55 PM, Height: 70 in, Weight: 172 lbs, BMI: 24.7, Temperature: 98.1 F, Pulse: 89 bpm, Respiratory Rate: 18 breaths/min, Blood Pressure: 103/65 mmHg. Respiratory normal breathing without difficulty. Psychiatric this patient is able to make decisions and demonstrates good insight into disease process. Alert and Oriented x 3. pleasant and cooperative. Bayron, Dollene Cleveland ([id number]) General Notes: Patient's wounds for the most part did showed signs of continued issues with eschar noted over the feet, heels, and toes. With that being said I do believe Betadine is still appropriate we just need to make sure not to overdo this and he should have gauze between each of the toes as well. Of note  I am going also recommend based on what I am seeing that we have the patient go ahead and continue with the alginate to the leg although that was noted to be done previously and honestly the patient came in with no alginate on the leg today. Again I am not exactly sure what happened in that regard. Integumentary (Hair, Skin) Wound #1 status is Open. Original cause of wound was Gradually Appeared. The date acquired was: 10/07/2019. The wound has been in treatment 3 weeks. The wound is located on the Right,Posterior Lower Leg. The wound measures 3.3cm length x 2.7cm width x 0.1cm depth; 6.998cm^2 area and 0.7cm^3 volume. There is Fat Layer (Subcutaneous Tissue) exposed. There is no tunneling or undermining noted. There is a medium amount of serosanguineous drainage noted. There is large (67-100%) red, pink granulation within  the wound bed. There is a small (1-33%) amount of necrotic tissue within the wound bed including Eschar and Adherent Slough. Wound #10 status is Open. Original cause of wound was Gradually Appeared. The date acquired was: 10/06/2020. The wound has been in treatment 3 weeks. The wound is located on the Right,Lateral Toe Fourth. The wound measures 1cm length x 0.8cm width x 0.1cm depth; 0.628cm^2 area and 0.063cm^3 volume. There is Fat Layer (Subcutaneous Tissue) exposed. There is no tunneling or undermining noted. There is a medium amount of purulent drainage noted. There is small (1-33%) pink granulation within the wound bed. There is a large (67-100%) amount of necrotic tissue within the wound bed including Adherent Slough. Wound #11 status is Open. Original cause of wound was Gradually Appeared. The date acquired was: 10/06/2020. The wound has been in treatment 3 weeks. The wound is located on the Right,Medial Toe Fifth. The wound measures 1.5cm length x 0.7cm width x 0.1cm depth; 0.825cm^2 area and 0.082cm^3 volume. There is Fat Layer (Subcutaneous Tissue) exposed. There is no tunneling or undermining noted. There is a medium amount of serosanguineous drainage noted. There is small (1-33%) pink granulation within the wound bed. There is a large (67- 100%) amount of necrotic tissue within the wound bed including Eschar and Adherent Slough. Wound #12 status is Open. Original cause of wound was Gradually Appeared. The date acquired was: 10/06/2020. The wound has been in treatment 3 weeks. The wound is located on the Right,Lateral Toe Fifth. The wound measures 0.6cm length x 1.6cm width x 0.1cm depth; 0.754cm^2 area and 0.075cm^3 volume. There is Fat Layer (Subcutaneous Tissue) exposed. There is no tunneling or undermining noted. There is a medium amount of serosanguineous drainage noted. There is no granulation within the wound bed. There is a small (1-33%) amount of necrotic tissue within the wound bed  including Eschar and Adherent Slough. Wound #2 status is Open. Original cause of wound was Gradually Appeared. The date acquired was: 10/07/2019. The wound has been in treatment 3 weeks. The wound is located on the Right,Dorsal Foot. The wound measures 6.5cm length x 6cm width x 0.2cm depth; 30.631cm^2 area and 6.126cm^3 volume. There is Fat Layer (Subcutaneous Tissue) exposed. There is no tunneling or undermining noted. There is a none present amount of drainage noted. There is small (1-33%) pink granulation within the wound bed. There is a large (67-100%) amount of necrotic tissue within the wound bed including Eschar and Adherent Slough. Wound #3 status is Open. Original cause of wound was Pressure Injury. The date acquired was: 10/06/2020. The wound has been in treatment 3 weeks. The wound is located on the Right Calcaneus. The wound  measures 8cm length x 7.5cm width x 0.2cm depth; 47.124cm^2 area and 9.425cm^3 volume. There is no tunneling or undermining noted. There is a small amount of serosanguineous drainage noted. There is no granulation within the wound bed. There is a large (67-100%) amount of necrotic tissue within the wound bed including Eschar and Adherent Slough. Wound #4 status is Open. Original cause of wound was Gradually Appeared. The date acquired was: 10/06/2020. The wound has been in treatment 3 weeks. The wound is located on the Right Toe Great. The wound measures 2cm length x 3cm width x 0.2cm depth; 4.712cm^2 area and 0.942cm^3 volume. There is no tunneling or undermining noted. There is a medium amount of purulent drainage noted. There is no granulation within the wound bed. There is a large (67-100%) amount of necrotic tissue within the wound bed including Eschar and Adherent Slough. Wound #5 status is Open. Original cause of wound was Gradually Appeared. The date acquired was: 10/06/2020. The wound has been in treatment 3 weeks. The wound is located on the Right,Medial Toe  Second. The wound measures 2cm length x 2cm width x 0.2cm depth; 3.142cm^2 area and 0.628cm^3 volume. There is no tunneling or undermining noted. There is a medium amount of purulent drainage noted. There is no granulation within the wound bed. There is a large (67-100%) amount of necrotic tissue within the wound bed including Eschar and Adherent Slough. Wound #6 status is Open. Original cause of wound was Gradually Appeared. The date acquired was: 10/06/2020. The wound has been in treatment 3 weeks. The wound is located on the Right,Lateral Toe Second. The wound measures 2.5cm length x 1cm width x 0.2cm depth; 1.963cm^2 area and 0.393cm^3 volume. There is Fat Layer (Subcutaneous Tissue) exposed. There is no tunneling or undermining noted. There is a medium amount of purulent drainage noted. There is no granulation within the wound bed. There is a large (67-100%) amount of necrotic tissue within the wound bed including Eschar and Adherent Slough. Wound #7 status is Open. Original cause of wound was Gradually Appeared. The date acquired was: 10/06/2020. The wound has been in treatment 3 weeks. The wound is located on the Right,Medial Toe Third. The wound measures 2.5cm length x 1cm width x 0.2cm depth; 1.963cm^2 area and 0.393cm^3 volume. There is Fat Layer (Subcutaneous Tissue) exposed. There is no tunneling or undermining noted. There is a medium amount of purulent drainage noted. There is no granulation within the wound bed. There is a large (67-100%) amount of necrotic tissue within the wound bed including Eschar and Adherent Slough. Wound #8 status is Open. Original cause of wound was Gradually Appeared. The date acquired was: 10/06/2020. The wound has been in treatment 3 weeks. The wound is located on the Right Toe Third. The wound measures 1cm length x 1cm width x 0.2cm depth; 0.785cm^2 area and 0.157cm^3 volume. There is Fat Layer (Subcutaneous Tissue) exposed. There is no tunneling or  undermining noted. There is a medium amount of purulent drainage noted. There is small (1-33%) pink granulation within the wound bed. There is a large (67-100%) amount of necrotic tissue within the wound bed including Eschar and Adherent Slough. Wound #9 status is Open. Original cause of wound was Gradually Appeared. The date acquired was: 10/06/2020. The wound has been in treatment 3 weeks. The wound is located on the Right,Medial Toe Fourth. The wound measures 1cm length x 1.2cm width x 0.1cm depth; 0.942cm^2 area and 0.094cm^3 volume. There is no tunneling or undermining noted. There is  a medium amount of purulent drainage noted. There is no granulation within the wound bed. There is a large (67-100%) amount of necrotic tissue within the wound bed including Eschar and Adherent Slough. 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 Bathing/ Shower/ Hygiene: Wash wounds with antibacterial soap and water. May shower; gently cleanse wound with antibacterial soap, rinse and pat dry prior to dressing wounds - always change dressings immediately after shower No tub bath. Edema Control - Lymphedema / Segmental Compressive Device / Other: Elevate legs to the level of the heart and pump ankles as often as possible DO YOUR BEST to sleep in the bed at night. DO NOT sleep in your recliner. Long hours of sitting in a recliner leads to swelling of the legs and/or potential wounds on your backside. Off-Loading: Open toe surgical shoe Additional Orders / Instructions: Follow Nutritious Diet and  Increase Protein Intake Other: - Send current med list to wound care appt at [facility] VISIT; FOLLOW DAILY DRESSING CHANGES-SEE ORDERS Laboratory ordered were: Wound culture routine - non healing wound with green purlent drainage , and odor WOUND #1: - Lower Leg Wound Laterality: Right, Posterior Cleanser: Normal Saline 1 x Per Day/30 Days Discharge Instructions: Wash your hands with soap and water. Remove old dressing, discard into plastic bag and place into trash. Cleanse the wound with Normal Saline prior to applying a clean dressing using gauze sponges, not tissues or cotton balls. Do not scrub or use excessive force. Pat dry using gauze sponges, not tissue or cotton balls. Primary Dressing: Silvercel 4 1/4x 4 1/4 (in/in) 1 x Per Day/30 Days Discharge Instructions: Apply Silvercel 4 1/4x 4 1/4 (in/in) as instructed Secondary Dressing: ABD Pad 5x9 (in/in) 1 x Per Day/30 Days Discharge Instructions: Cover with ABD pad Secondary Dressing: Kerlix 4.5 x 4.1 (in/yd) 1 x Per Day/30 Days Discharge Instructions: LIGHTLY COVER WITH Kerlix TO SECURE OVER SILVER ALGINATE 4.5 x 4.1 (in/yd) as instructed Secured With: 35M Medipore H Soft Cloth Surgical Tape, 2x2 (in/yd) 1 x Per Day/30 Days Discharge Instructions: TO SECURE KERLIX WOUND #10: - Toe Fourth Wound Laterality: Right, Lateral Cleanser: Betadine-swabs 1 x Per Day/30 Days Discharge Instructions: Apply Betadine as directed Cleanser: Normal Saline 1 x Per Day/30 Days Discharge Instructions: Wash your hands with soap and water. Remove old dressing, discard into plastic bag and place into trash. Cleanse the wound with Normal Saline prior to applying a clean dressing using gauze sponges, not tissues or cotton balls. Do not scrub or use excessive force. Pat dry using gauze sponges, not tissue or cotton balls. Primary Dressing: Gauze 1 x Per Day/30 Days Discharge Instructions: betadine moist gauze between toes Secondary Dressing: ABD Pad 5x9 (in/in) 1 x  Per Day/30 Days Discharge Instructions: Cover with ABD pad FOR PADDING/COVERAGE Secondary Dressing: Kerlix 4.5 x 4.1 (in/yd) 1 x Per Day/30 Days Discharge Instructions: LIGHTLY Apply Kerlix 4.5 x 4.1 (in/yd) as instructed Secured With: 35M Medipore H Soft Cloth Surgical Tape, 2x2 (in/yd) 1 x Per Day/30 Days Discharge Instructions: TO HOLD KERLIX WOUND #11: - Toe Fifth Wound Laterality: Right, Medial Cleanser: Betadine-swabs 1 x Per Day/30 Days Discharge Instructions: Apply Betadine as directed Cleanser: Normal  Saline 1 x Per Day/30 Days Discharge Instructions: Wash your hands with soap and water. Remove old dressing, discard into plastic bag and place into trash. Cleanse the wound with Normal Saline prior to applying a clean dressing using gauze sponges, not tissues or cotton balls. Do not scrub or use excessive force. Pat dry using gauze sponges, not tissue or cotton balls. Primary Dressing: Gauze 1 x Per Day/30 Days Discharge Instructions: betadine moist gauze between toes Secondary Dressing: ABD Pad 5x9 (in/in) 1 x Per Day/30 Days Quam, QUINTERIOUS WALRAVEN ([id number]) Discharge Instructions: Cover with ABD pad FOR PADDING/COVERAGE Secondary Dressing: Kerlix 4.5 x 4.1 (in/yd) 1 x Per Day/30 Days Discharge Instructions: LIGHTLY Apply Kerlix 4.5 x 4.1 (in/yd) as instructed Secured With: 80M Medipore H Soft Cloth Surgical Tape, 2x2 (in/yd) 1 x Per Day/30 Days Discharge Instructions: TO HOLD KERLIX WOUND #12: - Toe Fifth Wound Laterality: Right, Lateral Cleanser: Betadine-swabs 1 x Per Day/30 Days Discharge Instructions: Apply Betadine as directed Cleanser: Normal Saline 1 x Per Day/30 Days Discharge Instructions: Wash your hands with soap and water. Remove old dressing, discard into plastic bag and place into trash. Cleanse the wound with Normal Saline prior to applying a clean dressing using gauze sponges, not tissues or cotton balls. Do not scrub or use excessive force. Pat dry using gauze  sponges, not tissue or cotton balls. Primary Dressing: Gauze 1 x Per Day/30 Days Discharge Instructions: betadine moist gauze between toes Secondary Dressing: ABD Pad 5x9 (in/in) 1 x Per Day/30 Days Discharge Instructions: Cover with ABD pad FOR PADDING/COVERAGE Secondary Dressing: Kerlix 4.5 x 4.1 (in/yd) 1 x Per Day/30 Days Discharge Instructions: LIGHTLY Apply Kerlix 4.5 x 4.1 (in/yd) as instructed Secured With: 80M Medipore H Soft Cloth Surgical Tape, 2x2 (in/yd) 1 x Per Day/30 Days Discharge Instructions: TO HOLD KERLIX WOUND #2: - Foot Wound Laterality: Dorsal, Right Cleanser: Betadine-swabs 1 x Per Day/30 Days Discharge Instructions: Apply Betadine as directed Cleanser: Normal Saline 1 x Per Day/30 Days Discharge Instructions: Wash your hands with soap and water. Remove old dressing, discard into plastic bag and place into trash. Cleanse the wound with Normal Saline prior to applying a clean dressing using gauze sponges, not tissues or cotton balls. Do not scrub or use excessive force. Pat dry using gauze sponges, not tissue or cotton balls. Primary Dressing: Gauze 1 x Per Day/30 Days Discharge Instructions: betadine moist gauze Secondary Dressing: ABD Pad 5x9 (in/in) 1 x Per Day/30 Days Discharge Instructions: Cover with ABD pad FOR PADDING/COVERAGE Secondary Dressing: Kerlix 4.5 x 4.1 (in/yd) 1 x Per Day/30 Days Discharge Instructions: LIGHTLY Apply Kerlix 4.5 x 4.1 (in/yd) as instructed Secured With: 80M Medipore H Soft Cloth Surgical Tape, 2x2 (in/yd) 1 x Per Day/30 Days Discharge Instructions: TO HOLD KERLIX WOUND #3: - Calcaneus Wound Laterality: Right Cleanser: Betadine-swabs 1 x Per Day/30 Days Discharge Instructions: as directed Cleanser: Normal Saline 1 x Per Day/30 Days Discharge Instructions: Wash your hands with soap and water. Remove old dressing, discard into plastic bag and place into trash. Cleanse the wound with Normal Saline prior to applying a clean dressing  using gauze sponges, not tissues or cotton balls. Do not scrub or use excessive force. Pat dry using gauze sponges, not tissue or cotton balls. Primary Dressing: Gauze 1 x Per Day/30 Days Discharge Instructions: apply betadine moist gauze Secondary Dressing: ABD Pad 5x9 (in/in) 1 x Per Day/30 Days Discharge Instructions: Cover with ABD pad FOR PADDING/COVERAGE Secondary Dressing: Kerlix 4.5 x 4.1 (in/yd) 1 x Per  Day/30 Days Discharge Instructions: LIGHTLY Apply Kerlix 4.5 x 4.1 (in/yd) as instructed Secured With: 58M Medipore H Soft Cloth Surgical Tape, 2x2 (in/yd) 1 x Per Day/30 Days Discharge Instructions: TO HOLD KERLIX WOUND #4: - Toe Great Wound Laterality: Right Cleanser: Betadine-swabs 1 x Per Day/30 Days Discharge Instructions: Apply Betadine as directed Cleanser: Normal Saline 1 x Per Day/30 Days Discharge Instructions: Wash your hands with soap and water. Remove old dressing, discard into plastic bag and place into trash. Cleanse the wound with Normal Saline prior to applying a clean dressing using gauze sponges, not tissues or cotton balls. Do not scrub or use excessive force. Pat dry using gauze sponges, not tissue or cotton balls. Primary Dressing: Gauze 1 x Per Day/30 Days Discharge Instructions: betadine moist gauze between toes Secondary Dressing: ABD Pad 5x9 (in/in) 1 x Per Day/30 Days Discharge Instructions: Cover with ABD pad FOR PADDING/COVERAGE Secondary Dressing: Kerlix 4.5 x 4.1 (in/yd) 1 x Per Day/30 Days Discharge Instructions: LIGHTLY Apply Kerlix 4.5 x 4.1 (in/yd) as instructed Secured With: 58M Medipore H Soft Cloth Surgical Tape, 2x2 (in/yd) 1 x Per Day/30 Days Discharge Instructions: TO HOLD KERLIX WOUND #5: - Toe Second Wound Laterality: Right, Medial Cleanser: Betadine-swabs 1 x Per Day/30 Days Discharge Instructions: Apply Betadine as directed Cleanser: Normal Saline 1 x Per Day/30 Days Discharge Instructions: Wash your hands with soap and water. Remove  old dressing, discard into plastic bag and place into trash. Cleanse the wound with Normal Saline prior to applying a clean dressing using gauze sponges, not tissues or cotton balls. Do not scrub or use excessive force. Pat dry using gauze sponges, not tissue or cotton balls. Primary Dressing: Gauze 1 x Per Day/30 Days Discharge Instructions: betadine moist gauze between toes Secondary Dressing: ABD Pad 5x9 (in/in) 1 x Per Day/30 Days Discharge Instructions: Cover with ABD pad FOR PADDING/COVERAGE Secondary Dressing: Kerlix 4.5 x 4.1 (in/yd) 1 x Per Day/30 Days Discharge Instructions: LIGHTLY Apply Kerlix 4.5 x 4.1 (in/yd) as instructed Secured With: 58M Medipore H Soft Cloth Surgical Tape, 2x2 (in/yd) 1 x Per Day/30 Days Discharge Instructions: TO HOLD KERLIX Masley, ALPHONS BURGERT ([id number]) WOUND #6: - Toe Second Wound Laterality: Right, Lateral Cleanser: Betadine-swabs 1 x Per Day/30 Days Discharge Instructions: Apply Betadine as directed Cleanser: Normal Saline 1 x Per Day/30 Days Discharge Instructions: Wash your hands with soap and water. Remove old dressing, discard into plastic bag and place into trash. Cleanse the wound with Normal Saline prior to applying a clean dressing using gauze sponges, not tissues or cotton balls. Do not scrub or use excessive force. Pat dry using gauze sponges, not tissue or cotton balls. Primary Dressing: Gauze 1 x Per Day/30 Days Discharge Instructions: betadine moist gauze between toes Secondary Dressing: ABD Pad 5x9 (in/in) 1 x Per Day/30 Days Discharge Instructions: Cover with ABD pad FOR PADDING/COVERAGE Secondary Dressing: Kerlix 4.5 x 4.1 (in/yd) 1 x Per Day/30 Days Discharge Instructions: LIGHTLY Apply Kerlix 4.5 x 4.1 (in/yd) as instructed Secured With: 58M Medipore H Soft Cloth Surgical Tape, 2x2 (in/yd) 1 x Per Day/30 Days Discharge Instructions: TO HOLD KERLIX WOUND #7: - Toe Third Wound Laterality: Right, Medial Cleanser: Betadine-swabs 1 x  Per Day/30 Days Discharge Instructions: Apply Betadine as directed Cleanser: Normal Saline 1 x Per Day/30 Days Discharge Instructions: Wash your hands with soap and water. Remove old dressing, discard into plastic bag and place into trash. Cleanse the wound with Normal Saline prior to applying a clean dressing using gauze sponges, not  tissues or cotton balls. Do not scrub or use excessive force. Pat dry using gauze sponges, not tissue or cotton balls. Primary Dressing: Gauze 1 x Per Day/30 Days Discharge Instructions: betadine moist gauze between toes Secondary Dressing: ABD Pad 5x9 (in/in) 1 x Per Day/30 Days Discharge Instructions: Cover with ABD pad FOR PADDING/COVERAGE Secondary Dressing: Kerlix 4.5 x 4.1 (in/yd) 1 x Per Day/30 Days Discharge Instructions: LIGHTLY Apply Kerlix 4.5 x 4.1 (in/yd) as instructed Secured With: 32M Medipore H Soft Cloth Surgical Tape, 2x2 (in/yd) 1 x Per Day/30 Days Discharge Instructions: TO HOLD KERLIX WOUND #8: - Toe Third Wound Laterality: Right Cleanser: Betadine-swabs 1 x Per Day/30 Days Discharge Instructions: Apply Betadine as directed Cleanser: Normal Saline 1 x Per Day/30 Days Discharge Instructions: Wash your hands with soap and water. Remove old dressing, discard into plastic bag and place into trash. Cleanse the wound with Normal Saline prior to applying a clean dressing using gauze sponges, not tissues or cotton balls. Do not scrub or use excessive force. Pat dry using gauze sponges, not tissue or cotton balls. Primary Dressing: Gauze 1 x Per Day/30 Days Discharge Instructions: betadine moist gauze between toes Secondary Dressing: ABD Pad 5x9 (in/in) 1 x Per Day/30 Days Discharge Instructions: Cover with ABD pad FOR PADDING/COVERAGE Secondary Dressing: Kerlix 4.5 x 4.1 (in/yd) 1 x Per Day/30 Days Discharge Instructions: LIGHTLY Apply Kerlix 4.5 x 4.1 (in/yd) as instructed Secured With: 32M Medipore H Soft Cloth Surgical Tape, 2x2 (in/yd) 1 x Per  Day/30 Days Discharge Instructions: TO HOLD KERLIX WOUND #9: - Toe Fourth Wound Laterality: Right, Medial Cleanser: Betadine-swabs 1 x Per Day/30 Days Discharge Instructions: Apply Betadine as directed Cleanser: Normal Saline 1 x Per Day/30 Days Discharge Instructions: Wash your hands with soap and water. Remove old dressing, discard into plastic bag and place into trash. Cleanse the wound with Normal Saline prior to applying a clean dressing using gauze sponges, not tissues or cotton balls. Do not scrub or use excessive force. Pat dry using gauze sponges, not tissue or cotton balls. Primary Dressing: Gauze 1 x Per Day/30 Days Discharge Instructions: betadine moist gauze between toes Secondary Dressing: ABD Pad 5x9 (in/in) 1 x Per Day/30 Days Discharge Instructions: Cover with ABD pad FOR PADDING/COVERAGE Secondary Dressing: Kerlix 4.5 x 4.1 (in/yd) 1 x Per Day/30 Days Discharge Instructions: LIGHTLY Apply Kerlix 4.5 x 4.1 (in/yd) as instructed Secured With: 32M Medipore H Soft Cloth Surgical Tape, 2x2 (in/yd) 1 x Per Day/30 Days Discharge Instructions: TO HOLD KERLIX 1. Would recommend currently that we go ahead and initiate treatment with a continuation of the Betadine moistened gauze to the toes she he should have dry gauze between the toes as well to help keep things from macerating and breaking down. 2. I am also can recommend silver alginate to the leg itself which I think is important. 3. We use ABD pads over top of this followed by roll gauze to secure in place. 4. I did discuss with the nursing facility and specifically director of nursing at [facility] about the situation currently with the dressing changes she has been looking at this and address any needs as necessary. 5. I did send a culture as well to evaluate for the possibility of Pseudomonas being an issue here. I am also can place the patient on Levaquin which I think would be appropriate based on what I am seeing.  We will see what the culture states when it returns as well. We will see patient back for  reevaluation in 1 week here in the clinic. If anything worsens or changes patient will contact our office for additional recommendations. Electronic Signature(s) TADAN, SHILL ([id number]) Signed: 10/28/2020 3:08:47 PM By: Lenda Kelp PA-C Entered By: Lenda Kelp on 10/28/2020 15:08:46 Farabaugh, Dollene Cleveland ([id number]) -------------------------------------------------------------------------------- SuperBill Details Patient Name: Brad [place] Date of Service: 10/28/2020 Medical Record Number: [id number] Patient Account Number: [id number] Date of Birth/Sex: [birth year] (65 y.o. M) Treating RN: Yevonne Pax Primary Care Provider: [name], [name] Other Clinician: Referring Provider: Azucena Kuba, [name] Treating Provider/Extender: Rowan Blase in Treatment: 3 Diagnosis Coding ICD-10 Codes Code Description I73.89 Other specified peripheral vascular diseases I87.2 Venous insufficiency (chronic) (peripheral) L97.512 Non-pressure chronic ulcer of other part of right foot with fat layer exposed L97.812 Non-pressure chronic ulcer of other part of right lower leg with fat layer exposed L89.610 Pressure ulcer of right heel, unstageable Z79.01 Long term (current) use of anticoagulants Facility Procedures CPT4 Code: 16945038 Description: 88280 - WOUND CARE VISIT-LEV 5 EST PT Modifier: Quantity: 1 Physician Procedures CPT4 Code: 0349179 Description: 99214 - WC PHYS LEVEL 4 - EST PT Modifier: Quantity: 1 CPT4 Code: Description: ICD-10 Diagnosis Description I73.89 Other specified peripheral vascular diseases I87.2 Venous insufficiency (chronic) (peripheral) L97.512 Non-pressure chronic ulcer of other part of right foot with fat layer e L97.812 Non-pressure chronic  ulcer of other part of right lower leg with fat la Modifier: xposed yer exposed Quantity: Electronic Signature(s) Signed: 10/28/2020  3:08:59 PM By: Lenda Kelp PA-C Entered By: Lenda Kelp on 10/28/2020 15:08:57

## 2020-10-30 LAB — AEROBIC CULTURE W GRAM STAIN (SUPERFICIAL SPECIMEN): Gram Stain: NONE SEEN

## 2020-11-03 ENCOUNTER — Encounter: Payer: Medicare Other | Admitting: Physician Assistant

## 2020-11-18 ENCOUNTER — Encounter: Payer: Self-pay | Admitting: Nurse Practitioner

## 2020-11-18 ENCOUNTER — Non-Acute Institutional Stay: Payer: Medicare Other | Admitting: Nurse Practitioner

## 2020-11-18 VITALS — BP 142/78 | HR 79 | Temp 97.9°F | Resp 18 | Wt 178.9 lb

## 2020-11-18 DIAGNOSIS — Z515 Encounter for palliative care: Secondary | ICD-10-CM

## 2020-11-18 DIAGNOSIS — R5381 Other malaise: Secondary | ICD-10-CM

## 2020-11-18 NOTE — Progress Notes (Signed)
Therapist, nutritional Palliative Care Consult Note Telephone: [phone]  Fax: [phone]   Date of encounter: 11/18/20 PATIENT NAME: Brad [place] [address] [place] [address]   [id number] (home)  DOB: 17-Sep-1955 MRN: [id number] PRIMARY CARE PROVIDER:   Dr Zenaida Niece [facility] Azucena Kuba, [name], MD,  [address] [place] [phone]  RESPONSIBLE PARTY:    Contact Information     Name Relation Home Work Wayne Brother [phone]     moore,patsy Sister [phone]        I met face to face with patient in facility. Palliative Care was asked to follow this patient by consultation request of  Dr Leonia Reader to address advance care planning and complex medical decision making. This is the initial visit.  ASSESSMENT AND PLAN / RECOMMENDATIONS:   Symptom Management/Plan: ACP: Full code, will revisit with next pc visit 2.  Debility secondary to PVD; encourage oob, mobility with w/c; pending op consult for second opinion pvd.  1. Advance Care Planning;  2. Goals of Care: Goals include to maximize quality of life and symptom management. Our advance care planning conversation included a discussion about:    The value and importance of advance care planning  Exploration of personal, cultural or spiritual beliefs that might influence medical decisions  Exploration of goals of care in the event of a sudden injury or illness  Identification and preparation of a healthcare agent  Review and updating or creation of an advance directive document. 3. Palliative care encounter; Palliative care encounter; Palliative medicine team will continue to support patient, patient's family, and medical team. Visit consisted of counseling and education dealing with the complex and emotionally intense issues of symptom management and palliative care in the setting of serious and potentially life-threatening illness  Follow up  Palliative Care Visit: Palliative care will continue to follow for complex medical decision making, advance care planning, and clarification of goals. Return 2 weeks or prn.  I spent 45 minutes providing this consultation. More than 50% of the time in this consultation was spent in counseling and care coordination.  PPS: 40%  Chief Complaint: Initial Palliative consult for complex medical decision making   HISTORY OF PRESENT ILLNESS:  Brad [place] is a 65 y.o. year old male  with multiple medical problems including Multiple medical problems including peripheral vascular disease, diabetes, hypertension, chronic lymphoedema, nicotine dependence, chronic alcohol use, multiple wounds. 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 Recommendation AKA, [place] refused, Wishing for a second opinion from [place] or [facility] vascular surgery. Brad. Frost was continued on long term Ivy antibiotics by infectious disease. Brad Frost was discharged too skilled nursing facility at [facility] where he currently resides. Brad Frost requires assistance for transfers, mobility and can sit up in the wheelchair. Brad. Frost Requires assistance for bathing, dressing. He does feed himself and appetite has varied depending on what's being served. He has had a 10.2 lb weight gain with BMI 25.7 since 08/2020. Staff and or says he can verbalize his own needs as he is his own responsible party. He is a full code. I visited and observed Brad Frost. We talked about purpose of palliative care visit. We talked about how Brad Frost has been feeling. Brad Frost endorses he is not feeling very well today. We talked about symptoms of pain, shortness of  breath which he denies. We talked about sleep and sleep patterns for which he said he is sleeping okay. Briefly talked about appetite. Decline physical assessment. Brad Ehrler endorses he would like to continue  further discussions of medical goals of care later. He just wanted to rest now. Brad. Frost in agreement to have palliative return for follow up visit. Emotional support provided. I updated staff and Brad [place]  09/23/2020 Weight 168.7 lbs 10/04/2020 Weight 178.9 lbs  BMI 25.7  10/23/2020 sodium 138, potassium 3.8, chloride 104, CO 221, calcium 8.4, BUN 4.6, creatinine 0.72, glucose 84, total protein 6.1, albumin 2.5, AST 13, ALT < 5  History obtained from review of EMR, discussion with Brad. Frost.  I reviewed available labs, medications, imaging, studies and related documents from the EMR.  Records reviewed and summarized above.   ROS Full 14 system review of systems performed and negative with exception of: as per HPI.   Physical Exam: Constitutional: NAD General: frail appearing, debilitated male, weak EYES: lids intact, ENMT: oral mucous membranes moist CV: S1S2, RRR Pulmonary: LCTA, no increased work of breathing, no cough, room air Abdomen: soft and non tender MSK: w/c dependent Skin: warm and dry Neuro:  + generalized weakness,  no cognitive impairment Psych: non-anxious affect, A and O x 3  CURRENT PROBLEM LIST:  Patient Active Problem List   Diagnosis Date Noted   Gangrene ([place]) 10/09/2020   Anemia of chronic disease 10/09/2020   Toe ulcer, right ([place]) 09/17/2020   Cellulitis of lower extremity 09/16/2020   COVID-19 04/13/2019   Hypotension    COVID-19 virus infection 04/04/2019   Pneumonia due to COVID-19 virus    Bilateral pulmonary embolism (HCC)    Lung mass    AKI (acute kidney injury) ([place])    Type 2 diabetes mellitus with diabetic neuropathy, without long-term current use of insulin (HCC)    PVD (peripheral vascular disease) ([place]) 02/14/2015   PAST MEDICAL HISTORY:  Active Ambulatory Problems    Diagnosis Date Noted   PVD (peripheral vascular disease) ([place]) 02/14/2015   COVID-19 virus infection 04/04/2019   Pneumonia due to COVID-19 virus     Bilateral pulmonary embolism (HCC)    Lung mass    AKI (acute kidney injury) ([place])    Type 2 diabetes mellitus with diabetic neuropathy, without long-term current use of insulin (HCC)    Hypotension    COVID-19 04/13/2019   Cellulitis of lower extremity 09/16/2020   Toe ulcer, right ([place]) 09/17/2020   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 The staff was was encouraged to call with questions and/or concerns. Provided general support and encouragement, no other unmet needs identified   Thank you for the opportunity to participate in the care of Brad. Rallis.  The palliative care team will continue to follow. Please call  our office at [phone] if we can be of additional assistance.   This chart was dictated using voice recognition software.  Despite best efforts to proofread,  errors can occur which can change the documentation meaning.   Dareth Andrew Z Calleen Alvis, [place] ,

## 2020-11-19 ENCOUNTER — Other Ambulatory Visit: Payer: Self-pay

## 2020-12-18 ENCOUNTER — Encounter: Payer: Self-pay | Admitting: Emergency Medicine

## 2020-12-18 ENCOUNTER — Emergency Department
Admission: EM | Admit: 2020-12-18 | Discharge: 2020-12-19 | Disposition: A | Discharge status: Skilled Nursing Facility | Payer: No Typology Code available for payment source | Attending: Emergency Medicine | Admitting: Emergency Medicine

## 2020-12-18 ENCOUNTER — Other Ambulatory Visit: Payer: Self-pay

## 2020-12-18 ENCOUNTER — Emergency Department: Payer: No Typology Code available for payment source

## 2020-12-18 DIAGNOSIS — Z7982 Long term (current) use of aspirin: Secondary | ICD-10-CM | POA: Insufficient documentation

## 2020-12-18 DIAGNOSIS — Z79899 Other long term (current) drug therapy: Secondary | ICD-10-CM | POA: Diagnosis not present

## 2020-12-18 DIAGNOSIS — E11621 Type 2 diabetes mellitus with foot ulcer: Secondary | ICD-10-CM | POA: Diagnosis not present

## 2020-12-18 DIAGNOSIS — Z8616 Personal history of COVID-19: Secondary | ICD-10-CM | POA: Diagnosis not present

## 2020-12-18 DIAGNOSIS — I10 Essential (primary) hypertension: Secondary | ICD-10-CM | POA: Diagnosis not present

## 2020-12-18 DIAGNOSIS — L97528 Non-pressure chronic ulcer of other part of left foot with other specified severity: Secondary | ICD-10-CM | POA: Diagnosis not present

## 2020-12-18 DIAGNOSIS — Z7901 Long term (current) use of anticoagulants: Secondary | ICD-10-CM | POA: Insufficient documentation

## 2020-12-18 DIAGNOSIS — I96 Gangrene, not elsewhere classified: Secondary | ICD-10-CM | POA: Insufficient documentation

## 2020-12-18 DIAGNOSIS — L97523 Non-pressure chronic ulcer of other part of left foot with necrosis of muscle: Secondary | ICD-10-CM

## 2020-12-18 DIAGNOSIS — M86172 Other acute osteomyelitis, left ankle and foot: Secondary | ICD-10-CM | POA: Diagnosis not present

## 2020-12-18 DIAGNOSIS — M869 Osteomyelitis, unspecified: Secondary | ICD-10-CM

## 2020-12-18 DIAGNOSIS — M79672 Pain in left foot: Secondary | ICD-10-CM | POA: Diagnosis present

## 2020-12-18 MED ORDER — CLINDAMYCIN HCL 300 MG PO CAPS: 300.0000 mg | ORAL_CAPSULE | Freq: Three times a day (TID) | ORAL | 0 refills | Status: AC

## 2020-12-18 NOTE — ED Notes (Signed)
Pt requesting to speak to md.  He thinks he can get his foot cleaned and leave.  Refusing labs and IV at this time.

## 2020-12-18 NOTE — ED Notes (Signed)
Went in with new MD who spoke to pt about the importance of staying and risk of death for leaving with condition of pt foot. Pt repeated verbal understanding and said he wanted to call a cab to come get him and come back when his family could be with him.

## 2020-12-18 NOTE — ED Provider Notes (Signed)
Emergency Medicine Provider Triage Evaluation Note  Brad [place] , a 65 y.o. male  was evaluated in triage.  Pt complains of worsening right foot wounds..  He refuses blood work out from 10 any diagnostics.  He refuses to tell me a timeframe of how long has been going on.  Poor historian.  Review of Systems  Positive: Wound to the right foot, maggots coming from his wounds Negative: Fevers or trauma  Physical Exam  BP 114/77   Pulse 92   Temp 98.6 F (37 C) (Oral)   Resp 18   Ht 5\' 11"  (1.803 m)   Wt 78 kg   SpO2 100%   BMI 23.99 kg/m  Gen:   Awake, no distress   Resp:  Normal effort  MSK:   Moves extremities without difficulty .  Rather extensive wounds to the dorsum of the right foot, erosion down to muscles and ligaments.  Cannot visualize bones externally on my limited exam.  Further erosion and loss of tissue to his first 2 toes. Other:    Medical Decision Making  Medically screening exam initiated at 10:31 PM.  Appropriate orders placed.  was informed that the remainder of the evaluation will be completed by another provider, this initial triage assessment does not replace that evaluation, and the importance of remaining in the ED until their evaluation is complete.     [name], MD 12/18/20 2232

## 2020-12-18 NOTE — ED Notes (Signed)
This RN at bedside to attempt PIV & cultures. Pt adamantly declines all bloodwork. "Not tonight, get that doctor in here - im ready to go."   MD aware.

## 2020-12-18 NOTE — ED Notes (Signed)
This RN to do bloodwork. Pt states he does not want bloodwork done because, "this just happened." explained the bloodwork will be an indication of how severe the infection may be. Pt continues to refuse. First Nurse made aware.

## 2020-12-18 NOTE — Discharge Instructions (Addendum)
Please come back as soon as possible for management of your foot.  As we discussed your foot is unfortunately dead had not heaving it remove immediately will lead to an overwhelming generalized infection of your body which will lead to your death. If you are not willing to return to the ER, please go see your doctor as soon as possible. I am also placing a wound clinic referral for you. Please at least take the antibiotics prescribed to you.

## 2020-12-18 NOTE — ED Provider Notes (Signed)
[facility] Emergency Department Provider Note  ____________________________________________  Time seen: Approximately 11:28 PM  I have reviewed the triage vital signs and the nursing notes.   HISTORY  Chief Complaint Wound Check   HPI Brad Frost is a 65 y.o. male with a history of peripheral vascular disease, hypertension, diabetes who presents for gangrene of his left foot.  Patient is coming from [facility].  He reports that he has had a dressing on the foot for a long time with no dressing changes.  He reports that a wound specialist looked at it yesterday and change the dressing for him.  He told patient that he needed to be seen for that.  Today when the dressing was removed in the emergency room maggots were found on the dressing and on the foot.  Patient is complaining of mild constant throbbing pain.  He denies any fever or chills.  He reports that he is here because he wants dressing changed.  Past Medical History:  Diagnosis Date   Diabetes mellitus without complication (HCC)    Hypertension    Peripheral vascular disease (HCC)     Patient Active Problem List   Diagnosis Date Noted   Gangrene (HCC) 10/09/2020   Anemia of chronic disease 10/09/2020   Toe ulcer, right (HCC) 09/17/2020   Cellulitis of lower extremity 09/16/2020   COVID-19 04/13/2019   Hypotension    COVID-19 virus infection 04/04/2019   Pneumonia due to COVID-19 virus    Bilateral pulmonary embolism (HCC)    Lung mass    AKI (acute kidney injury) (HCC)    Type 2 diabetes mellitus with diabetic neuropathy, without long-term current use of insulin (HCC)    PVD (peripheral vascular disease) (HCC) 02/14/2015    Past Surgical History:  Procedure Laterality Date   LEG SURGERY  August 20, 2014   Right Leg  BPG   LOWER EXTREMITY ANGIOGRAPHY Left 09/19/2020   Procedure: Lower Extremity Angiography;  Surgeon: Renford Dills, MD;  Location: [facility] INVASIVE CV LAB;   Service: Cardiovascular;  Laterality: Left;   LOWER EXTREMITY ANGIOGRAPHY Right 10/10/2020   Procedure: Lower Extremity Angiography;  Surgeon: Annice Needy, MD;  Location: [facility] INVASIVE CV LAB;  Service: Cardiovascular;  Laterality: Right;    Prior to Admission medications   Medication Sig Start Date End Date Taking? Authorizing Provider  acetaminophen (TYLENOL) 325 MG tablet Take 2 tablets (650 mg total) by mouth every 4 (four) hours as needed for headache or mild pain. 09/23/20  Yes Lurene Shadow, MD  clindamycin (CLEOCIN) 300 MG capsule Take 1 capsule (300 mg total) by mouth 3 (three) times daily for 10 days. 12/18/20 12/28/20 Yes Russia Scheiderer, [place], MD  clotrimazole (LOTRIMIN) 1 % cream Apply 1 application topically in the morning and at bedtime. 12/09/20  Yes [provider]  aspirin 81 MG tablet Take 81 mg by mouth daily. For prophylaxis    [provider]  atorvastatin (LIPITOR) 20 MG tablet Take 4 tablets (80 mg total) by mouth daily. 10/14/20   Tresa Moore, MD  metoprolol succinate (TOPROL-XL) 25 MG 24 hr tablet Take 1 tablet (25 mg total) by mouth daily. 04/09/19   Arnetha Courser, MD  rivaroxaban (XARELTO) 20 MG TABS tablet Take 1 tablet (20 mg total) by mouth at bedtime. 10/14/20   Tresa Moore, MD  tamsulosin (FLOMAX) 0.4 MG CAPS capsule Take 0.4 mg by mouth daily. For urinary    [provider]    Allergies  Sildenafil  Family History  Problem Relation Age of Onset   Hypertension Mother     Social History Social History   Tobacco Use   Smoking status: Former    Types: Cigarettes   Smokeless tobacco: Never  Substance Use Topics   Alcohol use: Yes   Drug use: No    Review of Systems  Constitutional: Negative for fever. Eyes: Negative for visual changes. ENT: Negative for sore throat. Neck: No neck pain  Cardiovascular: Negative for chest pain. Respiratory: Negative for shortness of breath. Gastrointestinal: Negative for  abdominal pain, vomiting or diarrhea. Genitourinary: Negative for dysuria. Musculoskeletal: Negative for back pain. + foot pain Skin: Negative for rash. Neurological: Negative for headaches, weakness or numbness. Psych: No SI or HI  ____________________________________________   PHYSICAL EXAM:  VITAL SIGNS: ED Triage Vitals [12/18/20 1840]  Enc Vitals Group     BP 127/76     Pulse Rate 97     Resp 18     Temp 98 F (36.7 C)     Temp Source Oral     SpO2 99 %     Weight 172 lb (78 kg)     Height 5\' 11"  (1.803 m)     Head Circumference      Peak Flow      Pain Score 6     Pain Loc      Pain Edu?      Excl. in GC?     Constitutional: Alert and oriented. Well appearing and in no apparent distress. HEENT:      Head: Normocephalic and atraumatic.         Eyes: Conjunctivae are normal. Sclera is non-icteric.       Mouth/Throat: Mucous membranes are moist.       Neck: Supple with no signs of meningismus. Cardiovascular: Regular rate and rhythm. No murmurs, gallops, or rubs. 2+ symmetrical distal pulses are present in all extremities. No JVD. Respiratory: Normal respiratory effort. Lungs are clear to auscultation bilaterally.  Gastrointestinal: Soft, non tender, and non distended with positive bowel sounds. No rebound or guarding. Genitourinary: No CVA tenderness. Musculoskeletal:  There are several deep infected and large ulcers on the dorsum of the foot and heel. The ulcers are deep all the way to the bone in some locations, there is significant necrosis of the tissue of the foot, the foot has no pulses and no capillary refill.  The toes are necrotic. Neurologic: Normal speech and language. Face is symmetric. Moving all extremities. No gross focal neurologic deficits are appreciated. Skin: Skin is warm, dry and intact. No rash noted. Psychiatric: Mood and affect are normal. Speech and behavior are normal.  ____________________________________________   LABS (all labs  ordered are listed, but only abnormal results are displayed)  Labs Reviewed  CULTURE, BLOOD (ROUTINE X 2)  CULTURE, BLOOD (ROUTINE X 2)  RESP PANEL BY RT-PCR (FLU A&B, COVID) ARPGX2  LACTIC ACID, PLASMA  LACTIC ACID, PLASMA  COMPREHENSIVE METABOLIC PANEL  CBC WITH DIFFERENTIAL/PLATELET  SEDIMENTATION RATE   ____________________________________________  EKG  none  ____________________________________________  RADIOLOGY  I have personally reviewed the images performed during this visit and I agree with the Radiologist's read.   Interpretation by Radiologist:  DG Foot Complete Right  Result Date: 12/18/2020 CLINICAL DATA:  Chronic right foot wound with maggot infestation. EXAM: RIGHT FOOT COMPLETE - 3+ VIEW COMPARISON:  None. FINDINGS: The bones of the right foot are diffusely osteopenic. There is diffuse soft tissue swelling of the right  foot. There are soft tissue ulcers involving the heel, likely representing a decubitus ulcer of the heel, as well as medial to the first metatarsophalangeal joint, involving the lateral aspect of the great toe, the medial aspect of the second digit, and the distal aspect of the fourth and fifth digits. There are erosive changes involving the first metatarsal head medially in keeping with changes of osteomyelitis. There is probable exposure of the distal phalanx of the second, fourth, and fifth digits by overlying ulcers with erosive changes involving the second through fifth distal phalanges in keeping with osteomyelitis. No superimposed fracture. Advanced vascular calcifications are seen throughout the right foot. IMPRESSION: Multiple ulcerations of the right foot with erosive changes in keeping with osteomyelitis involving the first metatarsal head and distal phalanges of the second through fifth digits. Decubitus heel ulcer. Peripheral vascular disease. Electronically Signed   By: Helyn Numbers M.D.   On: 12/18/2020 23:00     ____________________________________________   PROCEDURES  Procedure(s) performed: None Procedures Critical Care performed:  None ____________________________________________   INITIAL IMPRESSION / ASSESSMENT AND PLAN / ED COURSE  65 y.o. male with a history of peripheral vascular disease, hypertension, diabetes who presents for gangrene of his left foot.  Patient has gangrene of the left foot with several very deep ulcerations, wounds are infected with maggots and purulent discharge, there is no pulse or capillary refill to the foot.  X-ray consistent with osteomyelitis of the digits.  Unfortunately patient is declining any type of intervention including blood work, IV antibiotics, surgery/podiatry consultation, or admission to the hospital.  Patient reports that he knows that he is going to lose his foot and he is not mentally ready for that yet.  He reports that his brother is coming to visit him in 2 days and he needs to wait for his brother before he is able to stay in the hospital.  I did discuss with the patient that we could just get him admitted for IV antibiotics, debridement, and a wound consultation to try to prevent the infection from progressing until his brother is able to arrive.  Patient reports that he does not want to stay in the hospital without his brother, and that he needs to smoke and will not stop smoking and since we do not allow him to smoke in the hospital he needs to leave.  I offered him a nicotine patch and he declined.  He understands that the foot is necrotic and lack of proper treatment will lead to overwhelming sepsis, septic shock, and possibly imminent death.  Patient tells me that he "would rather die than lose his foot and that is his choice."  I tried to convince him several times to stay at which point he told me that I was holding him hostage against his will and he was demanding to be discharged.  He again reminded me that that is his life and he has the  right to decide what he wants and that he would prefer to die from an overwhelming infection than lose his foot.  I did offer to provide him with antibiotics in the emergency room which he declined.  He did accept a prescription for oral antibiotics which I told him we will not do anything but I felt that unfortunately that was the only thing that I could do at this point.  I placed an order for a emergent referral to the wound clinic.  I urged him to return at any time if he changes  his mind and wishes to continue his treatment.      _____________________________________________ Please note:  Patient was evaluated in Emergency Department today for the symptoms described in the history of present illness. Patient was evaluated in the context of the global COVID-19 pandemic, which necessitated consideration that the patient might be at risk for infection with the SARS-CoV-2 virus that causes COVID-19. Institutional protocols and algorithms that pertain to the evaluation of patients at risk for COVID-19 are in a state of rapid change based on information released by regulatory bodies including the CDC and federal and state organizations. These policies and algorithms were followed during the patient's care in the ED.  Some ED evaluations and interventions may be delayed as a result of limited staffing during the pandemic.   [place] Controlled Substance Database was reviewed by me. ____________________________________________   FINAL CLINICAL IMPRESSION(S) / ED DIAGNOSES   Final diagnoses:  Perforating ulcer of the foot, left, with necrosis of muscle (HCC)  Gangrene of left foot (HCC)  Osteomyelitis of left foot, unspecified type (HCC)      NEW MEDICATIONS STARTED DURING THIS VISIT:  ED Discharge Orders          Ordered    clindamycin (CLEOCIN) 300 MG capsule  3 times daily        12/18/20 2327    Ambulatory referral to Wound Clinic        12/18/20 2328             Note:  This  document was prepared using Dragon voice recognition software and may include unintentional dictation errors.    Nita SickleVeronese, [place], MD 12/18/20 [phone]

## 2020-12-18 NOTE — ED Notes (Signed)
From [facility] care wound on right foot +maggots, dressing hasn't been changed-was supposed to be changed twice a day, pt is alert and oriented x4. NAD. VSS. 110/68; 109; 96% RA; 97.5.

## 2020-12-18 NOTE — ED Notes (Signed)
Went into room with md who spoke spoke with pt about the severity of infection. Told him risk is amputation or death. Pt stated he needed to smoke for his nerves. Pt was offered nicotine patch and medication for pain and anxiety pt. Refused. Asked to be wrapped back up and said he could get a cab and come back tomorrow.

## 2020-12-18 NOTE — ED Triage Notes (Signed)
Pt via EMS from [facility]. Pt states that he has a wound on his R foot around his toes. States it was positive for maggots. Pt states they haven't been changing his dressing properly. States he does have some R foot pain. Denies other complaints. Pt is A&Ox4 and NAD.

## 2020-12-19 NOTE — ED Notes (Signed)
With pt permission called brother who wants to talk pt into staying and getting treated.

## 2020-12-19 NOTE — ED Notes (Signed)
Pt refused any v/s saying he was ready to leave. Departed with EMS. Report called to [place] at facility

## 2020-12-26 ENCOUNTER — Telehealth (INDEPENDENT_AMBULATORY_CARE_PROVIDER_SITE_OTHER): Payer: Self-pay | Admitting: Vascular Surgery

## 2020-12-26 ENCOUNTER — Other Ambulatory Visit: Payer: Self-pay

## 2020-12-26 ENCOUNTER — Encounter: Payer: Medicare Other | Attending: Physician Assistant | Admitting: Physician Assistant

## 2020-12-26 DIAGNOSIS — I890 Lymphedema, not elsewhere classified: Secondary | ICD-10-CM | POA: Insufficient documentation

## 2020-12-26 DIAGNOSIS — L89614 Pressure ulcer of right heel, stage 4: Secondary | ICD-10-CM | POA: Insufficient documentation

## 2020-12-26 DIAGNOSIS — E11621 Type 2 diabetes mellitus with foot ulcer: Secondary | ICD-10-CM | POA: Insufficient documentation

## 2020-12-26 DIAGNOSIS — L97812 Non-pressure chronic ulcer of other part of right lower leg with fat layer exposed: Secondary | ICD-10-CM | POA: Diagnosis not present

## 2020-12-26 DIAGNOSIS — F17200 Nicotine dependence, unspecified, uncomplicated: Secondary | ICD-10-CM | POA: Insufficient documentation

## 2020-12-26 DIAGNOSIS — E1151 Type 2 diabetes mellitus with diabetic peripheral angiopathy without gangrene: Secondary | ICD-10-CM | POA: Insufficient documentation

## 2020-12-26 DIAGNOSIS — I872 Venous insufficiency (chronic) (peripheral): Secondary | ICD-10-CM | POA: Insufficient documentation

## 2020-12-26 DIAGNOSIS — Z7901 Long term (current) use of anticoagulants: Secondary | ICD-10-CM | POA: Diagnosis not present

## 2020-12-26 DIAGNOSIS — L97514 Non-pressure chronic ulcer of other part of right foot with necrosis of bone: Secondary | ICD-10-CM | POA: Insufficient documentation

## 2020-12-26 DIAGNOSIS — L97822 Non-pressure chronic ulcer of other part of left lower leg with fat layer exposed: Secondary | ICD-10-CM | POA: Insufficient documentation

## 2020-12-26 NOTE — Progress Notes (Signed)
CHEVIS, WEISENSEL ([id number]) Visit Report for 12/26/2020 Abuse/Suicide Risk Screen Details Patient Name: Brad Frost, Brad Frost. Date of Service: 12/26/2020 9:45 AM Medical Record Number: [id number] Patient Account Number: [id number] Date of Birth/Sex: June 22, 1955 (65 y.o. M) Treating RN: Hansel Feinstein Primary Care Elea Holtzclaw: [name], [name] Other Clinician: Referring Raynell Scott: [name], [name] Treating Cartier Mapel/Extender: Rowan Blase in Treatment: 0 Abuse/Suicide Risk Screen Items Answer ABUSE RISK SCREEN: Has anyone close to you tried to hurt or harm you recentlyo No Do you feel uncomfortable with anyone in your familyo No Has anyone forced you do things that you didnot want to doo No Electronic Signature(s) Signed: 12/26/2020 1:30:21 PM By: Hansel Feinstein Entered By: Hansel Feinstein on 12/26/2020 10:00:13 Sidor, Dollene Cleveland ([id number]) -------------------------------------------------------------------------------- Activities of Daily Living Details Patient Name: Brad Frost. Date of Service: 12/26/2020 9:45 AM Medical Record Number: [id number] Patient Account Number: [id number] Date of Birth/Sex: Feb 10, 1956 (65 y.o. M) Treating RN: Hansel Feinstein Primary Care Paislei Dorval: [name], [name] Other Clinician: Referring Sparrow Siracusa: [name], [name] Treating Tashianna Broome/Extender: Rowan Blase in Treatment: 0 Activities of Daily Living Items Answer Activities of Daily Living (Please select one for each item) Drive Automobile Not Able Take Medications Need Assistance Use Telephone Completely Able Care for Appearance Need Assistance Use Toilet Need Assistance Bath / Shower Need Assistance Dress Self Need Assistance Feed Self Need Assistance Walk Need Assistance Get In / Out Bed Need Assistance Housework Not Able Prepare Meals Not Able Handle Money Need Assistance Shop for Self Not Able Electronic Signature(s) Signed: 12/26/2020 1:30:21 PM By: Hansel Feinstein Entered By: Hansel Feinstein on 12/26/2020 10:00:49 Casa,  Dollene Cleveland ([id number]) -------------------------------------------------------------------------------- Education Screening Details Patient Name: Brad Frost. Date of Service: 12/26/2020 9:45 AM Medical Record Number: [id number] Patient Account Number: [id number] Date of Birth/Sex: 1955/06/06 (65 y.o. M) Treating RN: Hansel Feinstein Primary Care Marialuiza Car: [name], [name] Other Clinician: Referring Casia Corti: [name], [name] Treating Lilyrose Tanney/Extender: Rowan Blase in Treatment: 0 Primary Learner Assessed: Patient Learning Preferences/Education Level/Primary Language Learning Preference: Explanation Highest Education Level: College or Above Preferred Language: English Cognitive Barrier Language Barrier: No Translator Needed: No Memory Deficit: No Emotional Barrier: No Cultural/Religious Beliefs Affecting Medical Care: No Physical Barrier Impaired Vision: No Impaired Hearing: No Decreased Hand dexterity: No Knowledge/Comprehension Knowledge Level: Medium Comprehension Level: Medium Ability to understand written instructions: Medium Ability to understand verbal instructions: Medium Motivation Anxiety Level: Anxious Cooperation: Cooperative Education Importance: Acknowledges Need Interest in Health Problems: Asks Questions Perception: Coherent Willingness to Engage in Self-Management Medium Activities: Readiness to Engage in Self-Management Medium Activities: Electronic Signature(s) Signed: 12/26/2020 1:30:21 PM By: Hansel Feinstein Entered By: Hansel Feinstein on 12/26/2020 10:29:44 Vidrine, Dollene Cleveland ([id number]) -------------------------------------------------------------------------------- Fall Risk Assessment Details Patient Name: Brad Frost. Date of Service: 12/26/2020 9:45 AM Medical Record Number: [id number] Patient Account Number: [id number] Date of Birth/Sex: 11/13/1955 (65 y.o. M) Treating RN: Hansel Feinstein Primary Care Tahni Porchia: [name], [name] Other Clinician: Referring  Alberto Pina: [name], [name] Treating Kyen Taite/Extender: Rowan Blase in Treatment: 0 Fall Risk Assessment Items Have you had 2 or more falls in the last 12 monthso 0 No Have you had any fall that resulted in injury in the last 12 monthso 0 No FALLS RISK SCREEN History of falling - immediate or within 3 months 0 No Secondary diagnosis (Do you have 2 or more medical diagnoseso) 0 No Ambulatory aid None/bed rest/wheelchair/nurse 0 No Crutches/cane/walker 15 Yes Furniture 0 No Intravenous therapy Access/Saline/Heparin Lock 0 No Gait/Transferring Normal/ bed rest/ wheelchair 0 No Weak (short steps with or without shuffle,  stooped but able to lift head while walking, may 10 Yes seek support from furniture) Impaired (short steps with shuffle, may have difficulty arising from chair, head down, impaired 0 No balance) Mental Status Oriented to own ability 0 Yes Electronic Signature(s) Signed: 12/26/2020 1:30:21 PM By: Hansel Feinstein Entered By: Hansel Feinstein on 12/26/2020 10:01:36 Sehgal, Dollene Cleveland ([id number]) -------------------------------------------------------------------------------- Foot Assessment Details Patient Name: Brad Frost. Date of Service: 12/26/2020 9:45 AM Medical Record Number: [id number] Patient Account Number: [id number] Date of Birth/Sex: Sep 22, 1955 (65 y.o. M) Treating RN: Hansel Feinstein Primary Care Rylee Huestis: [name], [name] Other Clinician: Referring Malissia Rabbani: [name], [name] Treating Billye Nydam/Extender: Rowan Blase in Treatment: 0 Foot Assessment Items Site Locations + = Sensation present, - = Sensation absent, C = Callus, U = Ulcer R = Redness, W = Warmth, M = Maceration, PU = Pre-ulcerative lesion F = Fissure, S = Swelling, D = Dryness Assessment Right: Left: Other Deformity: No No Prior Foot Ulcer: Yes No Prior Amputation: No No Charcot Joint: No No Ambulatory Status: Ambulatory With Help Assistance Device: Wheelchair Gait: Academic librarian  Signature(s) Signed: 12/26/2020 1:30:21 PM By: Hansel Feinstein Entered By: Hansel Feinstein on 12/26/2020 10:03:55 Schorr, Dollene [place] ([id number]) -------------------------------------------------------------------------------- Nutrition Risk Screening Details Patient Name: Brad Frost. Date of Service: 12/26/2020 9:45 AM Medical Record Number: [id number] Patient Account Number: [id number] Date of Birth/Sex: 03-03-1956 (65 y.o. M) Treating RN: Hansel Feinstein Primary Care Wofford Stratton: [name], [name] Other Clinician: Referring Silverio Hagan: [name], [name] Treating Carime Dinkel/Extender: Rowan Blase in Treatment: 0 Height (in): 70 Weight (lbs): 179 Body Mass Index (BMI): 25.7 Nutrition Risk Screening Items Score Screening NUTRITION RISK SCREEN: I have an illness or condition that made me change the kind and/or amount of food I eat 2 Yes I eat fewer than two meals per day 0 No I eat few fruits and vegetables, or milk products 0 No I have three or more drinks of beer, liquor or wine almost every day 0 No I have tooth or mouth problems that make it hard for me to eat 0 No I don't always have enough money to buy the food I need 0 No I eat alone most of the time 1 Yes I take three or more different prescribed or over-the-counter drugs a day 1 Yes Without wanting to, I have lost or gained 10 pounds in the last six months 0 No I am not always physically able to shop, cook and/or feed myself 2 Yes Nutrition Protocols Good Risk Protocol Moderate Risk Protocol 0 Provide education on nutrition High Risk Proctocol Risk Level: High Risk Score: 6 Electronic Signature(s) Signed: 12/26/2020 1:30:21 PM By: Hansel Feinstein Entered ByHansel Feinstein on 12/26/2020 10:01:51

## 2020-12-26 NOTE — Telephone Encounter (Signed)
Called and scheduled appt with facility

## 2020-12-26 NOTE — Progress Notes (Signed)
Brad Frost ([id number]) Visit Report for 12/26/2020 Chief Complaint Document Details Patient Name: Brad Frost, Brad Frost. Date of Service: 12/26/2020 9:45 AM Medical Record Number: [id number] Patient Account Number: [id number] Date of Birth/Sex: [birth year] (65 y.o. M) Treating RN: Hansel Feinstein Primary Care Provider: [name], [name] Other Clinician: Referring Provider: [name], [name] Treating Provider/Extender: Rowan Blase in Treatment: 0 Information Obtained from: Patient Chief Complaint Right LE Ulcers with Arterial Insufficiency and left leg cellulitis Electronic Signature(s) Signed: 12/26/2020 10:46:34 AM By: Lenda Kelp PA-C Entered By: Lenda Kelp on 12/26/2020 10:46:34 Mesmer, Dollene Cleveland ([id number]) -------------------------------------------------------------------------------- HPI Details Patient Name: Brad Frost. Date of Service: 12/26/2020 9:45 AM Medical Record Number: [id number] Patient Account Number: [id number] Date of Birth/Sex: 09-Apr-1956 (65 y.o. M) Treating RN: Hansel Feinstein Primary Care Provider: [name], [name] Other Clinician: Referring Provider: [name], [name] Treating Provider/Extender: Rowan Blase in Treatment: 0 History of Present Illness HPI Description: 10/06/2020 upon evaluation today patient appears to be doing somewhat poorly in regard to his foot ulcer unfortunately. He has been tolerating the dressing changes without complication. Fortunately there does not appear to be any evidence of active infection which is great news. No fevers, chills, nausea, vomiting, or diarrhea. With that being said he does unfortunately have significant peripheral vascular disease. He does have a vascular surgeon, Dr. Gilda Crease who has been taking care of him. I did review his note from [facility] from 09/19/2020 and this was an abdominal aortogram. Subsequently the patient did have some vascular work to try to improve blood flow. There was comment on  whether or not he could undergo a repeat bypass surgery as he previously had one of the side. Subsequently it was suggested that this would not be something the patient would actually tolerate and therefore it is not advised that is the right way to go at this point. With that being said the patient does seem to be doing okay from the standpoint of infection currently but nonetheless I am very concerned about the fact that if things do not continue to improve he may actually be looking at above-knee amputation which is what Dr. Gilda Crease has alluded to as well to be honest. With that being said I did review the patient's wounds with him today I explained what the critical nature of the peripheral vascular disease diagnosis was in this case and the fact that if we cannot get the wounds to heal he would be looking at an above-knee amputation he voiced understanding. The patient is on Eliquis which appears to be as a result of a stent being placed try to keep this patent and open. With that being said even so I am not certain whether or not he is going to be able to actually heal this wound. I think that there is a chance for healing but also a high likelihood that this may be a very difficult prospect. Unfortunately it was noted as well that the patient did have maggots in around the wounds on his toes upon evaluation today. Obviously this is something that we did attempt to addressed as much as possible today although again there is no guarantee that everything was completely removed and these were very tiny so it also may be that others hatch in the interim between now and when we were to see him back. The patient was somewhat of a poor historian and did not come with any paperwork from the facility so I did not have any further information on  him to be perfectly honest. I do know that however he is supposed to be on the Eliquis at this point. He does have chronic venous insufficiency noted as well as  lymphedema. 10/28/2020 upon evaluation today patient appears to be doing okay in regard to his wounds. With that being said unfortunately does appear that his dressing has been on longer than just a day based on what I am seeing he has a tremendous amount of Betadine which appears to have been used on the gauze overlying the wound and if this literally had been just changed within the last 24 hours this would still be wet with the amount of Betadine that I see present currently. To that end I feel like that this might not of been changed very well could have been due to agency nursing staff over the holiday weekend but again either way I did feel like it was important to reach out to the facility. Actually did speak with the director of nursing today. The patient did also come out with just his shirt and a gown over him with no pants although he did have underwear on. Nonetheless this to counter bothers me a little bit although again I am not exactly sure the reasoning behind this I did just want overlay it to the director of nursing so she could look into it and address any concerns as necessary. Other than this overall the patient does appear to be doing quite well in general all things considered he still has several areas of necrotic tissue but again nothing too tremendous at this point. He does have evidence however of blue-green drainage which has me concerned about the possibility of Pseudomonas I discussed that with him today I do believe he is probably going need to be started on Levaquin I discussed that with the director of nursing as well. Readmission: 12/26/2020 upon evaluation today patient appears for reevaluation here in the clinic by I am good actually put this as a consult only as there is really not a whole lot I can do for this gentleman unfortunately. He has a significant issue here with significant flow restriction into the right lower extremity. 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 He has significant blue-green drainage evidence of Pseudomonas in my opinion based on what I see. Nonetheless the last time I saw him which was back in July 2022 he actually was admitted to the hospital and then supposedly I thought was being admitted for palliative care following. With that being said he had requested on 12/18/2020 to go to the hospital for further evaluation and a check on his wounds. Subsequently at the hospital he apparently actually refused most of the care recommended which to be honest with that he really did need a amputation. He did not want to proceed with the amputation until his brother was around to be able to help and anything that needed help with. Nonetheless he said that would be 2 days. He got fairly upset in the ER and apparently even though they had a very good conversation with him about the fact that he could become septic and he could die he nonetheless discharged without being admitted to the hospital which was the recommendation. Unfortunately based on what I am seeing right now the patient appears to have significant infection of the bilateral lower extremities with blue-green drainage come from both this is not can to be resolved with the clindamycin. I  think that he needs something more and specifically I believe that Cipro would be the way to go. I discussed that with him today. Electronic Signature(s) Signed: 12/26/2020 3:57:54 PM By: Lenda Kelp PA-C Entered By: Lenda Kelp on 12/26/2020 15:57:54 Guyton, Dollene Cleveland ([id number]) -------------------------------------------------------------------------------- Physical Exam Details Patient Name: Brad Frost Date of Service: 12/26/2020 9:45 AM Medical Record Number: [id number] Patient Account Number: [id number] Date of Birth/Sex: June 09, 1955 (65 y.o. M) Treating RN: Hansel Feinstein Primary Care Provider: [name], [name] Other Clinician: Referring  Provider: [name], [name] Treating Provider/Extender: Rowan Blase in Treatment: 0 Constitutional sitting or standing blood pressure is within target range for patient.. pulse regular and within target range for patient.Marland Kitchen respirations regular, non- labored and within target range for patient.Marland Kitchen temperature within target range for patient.. Well-nourished and well-hydrated in no acute distress. Eyes conjunctiva clear no eyelid edema noted. pupils equal round and reactive to light and accommodation. Ears, Nose, Mouth, and Throat no gross abnormality of ear auricles or external auditory canals. normal hearing noted during conversation. mucus membranes moist. Respiratory normal breathing without difficulty. Psychiatric this patient is able to make decisions and demonstrates good insight into disease process. Alert and Oriented x 3. pleasant and cooperative. Notes Upon inspection patient's wound bed actually showed signs of significant necrosis of the right foot and again I think that he is absolutely going require amputation if he wants to not become septic in short order. Even oral antibiotics I do not think are good to be the answer for him long- term and I discussed this with the patient today as well he has significant peripheral vascular disease of the right leg and unfortunately the issue here is that there is no way to revascularize short of a bypass surgery and his skin integrity apparently is not sufficient for the surgery. Either way what he is likely going need is an above-knee amputation based on what I am hearing. Electronic Signature(s) Signed: 12/26/2020 3:58:38 PM By: Lenda Kelp PA-C Entered By: Lenda Kelp on 12/26/2020 15:58:38 Aldape, Dollene Cleveland ([id number]) -------------------------------------------------------------------------------- Physician Orders Details Patient Name: Brad Frost Date of Service: 12/26/2020 9:45 AM Medical Record Number:  [id number] Patient Account Number: [id number] Date of Birth/Sex: 1955/05/01 (65 y.o. 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 DO NOT sleep in your recliner. Long hours of sitting in a recliner leads to swelling of the legs and/or potential wounds on your backside. Bathing/ Shower/ Hygiene o No tub bath. Edema Control - Lymphedema / Segmental Compressive Device / Other o Elevate leg(s) parallel to the floor when sitting. Off-Loading Wound #15 Right Calcaneus o Open toe surgical shoe o Turn and reposition every 2 hours Wound #16 Right,Dorsal Foot o Open toe surgical shoe o Turn and reposition every 2 hours Medications-Please add to medication list. o P.O. Antibiotics -  start new antibiotic-prescription sent to SNF-stop Clindamycin Wound Treatment Wound #13 - Lower Leg Wound Laterality: Left, Posterior, Distal Cleanser: Soap and Water 1 x Per Day/30 Days Discharge Instructions: Gently cleanse wound with antibacterial soap, rinse and pat dry prior to dressing wounds Cleanser: Wound Cleanser 1 x Per Day/30 Days Discharge Instructions: Wash your hands with soap and water. Remove old dressing, discard into plastic bag and  place into trash. Cleanse the wound with Wound Cleanser prior to applying a clean dressing using gauze sponges, not tissues or cotton balls. Do not scrub or use excessive force. Pat dry using gauze sponges, not tissue or cotton balls. Primary Dressing: SILVERCEL Antimicrobial Alginate Dressing, 1x12 (in/in) 1 x Per Day/30 Days Secondary Dressing: ABD Pad 5x9 (in/in) 1 x Per Day/30 Days Discharge Instructions: Cover with ABD pad Secondary Dressing: Zetuvit Plus Silicone Non-bordered 5x5 (in/in) 1 x Per Day/30 Days Secured With: 16M Medipore H Soft Cloth Surgical Tape, 2x2 (in/yd) 1 x Per Day/30 Days Secured With: [employer] Farm[employer]erlix Roll Sterile or Non-Sterile 6-ply 4.5x4 (yd/yd) 1 x Per Day/30 Days Carles, Teion Elbert EwingsL. ([id number]) Discharge Instructions: Apply Kerlix as directed Wound #14 - Lower Leg Wound Laterality: Right, Anterior Cleanser: Soap and Water 1 x Per Day/30 Days Discharge Instructions: Gently cleanse wound with antibacterial soap, rinse and pat dry prior to dressing wounds Cleanser: Wound Cleanser 1 x Per Day/30 Days Discharge Instructions: Wash your hands with soap and water. Remove old dressing, discard into plastic bag and place into trash. Cleanse the wound with Wound Cleanser prior to applying a clean dressing using gauze sponges, not tissues or cotton balls. Do not scrub or use excessive force. Pat dry using gauze sponges, not tissue or cotton balls. Primary Dressing: SILVERCEL Antimicrobial Alginate Dressing, 1x12 (in/in) 1 x Per Day/30 Days Secondary Dressing: ABD Pad 5x9 (in/in) 1 x Per Day/30 Days Discharge Instructions: Cover with ABD pad Secondary Dressing: Zetuvit Plus Silicone Non-bordered 5x5 (in/in) 1 x Per Day/30 Days Secured With: 16M Medipore H Soft Cloth Surgical Tape, 2x2 (in/yd) 1 x Per Day/30 Days Secured With: Kerlix Roll Sterile or Non-Sterile 6-ply 4.5x4 (yd/yd) 1 x Per Day/30 Days Discharge Instructions: Apply Kerlix as directed Wound #15 - Calcaneus Wound  Laterality: Right Cleanser: Soap and Water 1 x Per Day/30 Days Discharge Instructions: Gently cleanse wound with antibacterial soap, rinse and pat dry prior to dressing wounds Cleanser: Wound Cleanser 1 x Per Day/30 Days Discharge Instructions: Wash your hands with soap and water. Remove old dressing, discard into plastic bag and place into trash. Cleanse the wound with Wound Cleanser prior to applying a clean dressing using gauze sponges, not tissues or cotton balls. Do not scrub or use excessive force. Pat dry using gauze sponges, not tissue or cotton balls. Primary Dressing: SILVERCEL Antimicrobial Alginate Dressing, 1x12 (in/in) 1 x Per Day/30 Days Secondary Dressing: ABD Pad 5x9 (in/in) 1 x Per Day/30 Days Discharge Instructions: Cover with ABD pad Secondary Dressing: Zetuvit Plus Silicone Non-bordered 5x5 (in/in) 1 x Per Day/30 Days Secured With: 16M Medipore H Soft Cloth Surgical Tape, 2x2 (in/yd) 1 x Per Day/30 Days Secured With: [employer]erl[employer] Roll Sterile or Non-Sterile 6-ply 4.5x4 (yd/yd) 1 x Per Day/30 Days Discharge Instructions: Apply Kerlix as directed Wound #16 - Foot Wound Laterality: Dorsal, Right Cleanser: Soap and Water 1 x Per Day/30 Days Discharge Instructions: Gently cleanse wound with antibacterial soap, rinse and pat dry prior to dressing wounds Cleanser: Wound Cleanser 1 x Per Day/30 Days Discharge Instructions: Wash your hands with soap and  water. Remove old dressing, discard into plastic bag and place into trash. Cleanse the wound with Wound Cleanser prior to applying a clean dressing using gauze sponges, not tissues or cotton balls. Do not scrub or use excessive force. Pat dry using gauze sponges, not tissue or cotton balls. Primary Dressing: SILVERCEL Antimicrobial Alginate Dressing, 1x12 (in/in) 1 x Per Day/30 Days Secondary Dressing: ABD Pad 5x9 (in/in) 1 x Per Day/30 Days Discharge Instructions: Cover with ABD pad Secondary Dressing: Zetuvit Plus Silicone Non-bordered  5x5 (in/in) 1 x Per Day/30 Days Secured With: 94M Medipore H Soft Cloth Surgical Tape, 2x2 (in/yd) 1 x Per Day/30 Days Secured With: Kerlix Roll Sterile or Non-Sterile 6-ply 4.5x4 (yd/yd) 1 x Per Day/30 Days Discharge Instructions: Apply Kerlix as directed DONNOVAN, STAMOUR ([id number]) Consults o Vascular - Refer back to Vascular for surgical consult/amputation evaluation Electronic Signature(s) Signed: 12/26/2020 1:30:21 PM By: Hansel Feinstein Signed: 12/26/2020 4:48:27 PM By: Lenda Kelp PA-C Entered By: Hansel Feinstein on 12/26/2020 13:29:07 Duchesneau, Dollene Cleveland ([id number]) -------------------------------------------------------------------------------- Problem List Details Patient Name: Brad Frost. Date of Service: 12/26/2020 9:45 AM Medical Record Number: [id number] Patient Account Number: [id number] Date of Birth/Sex: 01/05/1956 (65 y.o. M) Treating RN: Hansel Feinstein Primary Care Provider: [name], [name] Other Clinician: Referring Provider: [name], [name] Treating Provider/Extender: Rowan Blase in Treatment: 0 Active Problems ICD-10 Encounter Code Description Active Date MDM Diagnosis I73.89 Other specified peripheral vascular diseases 12/26/2020 No Yes I87.2 Venous insufficiency (chronic) (peripheral) 12/26/2020 No Yes L97.822 Non-pressure chronic ulcer of other part of left lower leg with fat layer 12/26/2020 No Yes exposed L97.812 Non-pressure chronic ulcer of other part of right lower leg with fat layer 12/26/2020 No Yes exposed 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 Date of Service: 12/26/2020 9:45 AM Medical Record Number: [id number] Patient Account Number: [id number] Date of Birth/Sex: 23-Nov-1955 (65 y.o. M) Treating RN: Hansel Feinstein Primary Care Provider: [name], [name] Other Clinician: Referring Provider: [name], [name] Treating Provider/Extender: Rowan Blase in Treatment: 0 Subjective Chief Complaint Information obtained from Patient Right LE Ulcers with Arterial Insufficiency and left leg cellulitis History of Present Illness (HPI) 10/06/2020 upon evaluation today patient appears to be doing somewhat poorly in regard to his foot ulcer unfortunately. He has been tolerating the dressing changes without complication. Fortunately there does not appear to be any evidence of active infection which is great news. No fevers, chills, nausea, vomiting, or diarrhea. With that being said he does unfortunately have significant peripheral vascular disease. He does have a vascular surgeon, Dr. Gilda Crease who has been taking care of him. I did review his note from  [facility] from 09/19/2020 and this was an abdominal aortogram. Subsequently the patient did have some vascular work to try to improve blood flow. There was comment on whether or not he could undergo a repeat bypass surgery as he previously had one of the side. Subsequently it was suggested that this would not be something the patient would actually tolerate and therefore it is not advised that is the right way  to go at this point. With that being said the patient does seem to be doing okay from the standpoint of infection currently but nonetheless I am very concerned about the fact that if things do not continue to improve he may actually be looking at above-knee amputation which is what Dr. Gilda Crease has alluded  to as well to be honest. With that being said I did review the patient's wounds with him today I explained what the critical nature of the peripheral vascular disease diagnosis was in this case and the fact that if we cannot get the wounds to heal he would be looking at an above-knee amputation he voiced understanding. The patient is on Eliquis which appears to be as a result of a stent being placed try to keep this patent and open. With that being said even so I am not certain whether or not he is going to be able to actually heal this wound. I think that there is a chance for healing but also a high likelihood that this may be a very difficult prospect. Unfortunately it was noted as well that the patient did have maggots in around the wounds on his toes upon evaluation today. Obviously this is something that we did attempt to addressed as much as possible today although again there is no guarantee that everything was completely removed and these were very tiny so it also may be that others hatch in the interim between now and when we were to see him back. The patient was somewhat of a poor historian and did not come with any paperwork from the facility so I did not have any further information on him to be perfectly honest. I do know that however he is supposed to be on the Eliquis at this point. He does have chronic venous insufficiency noted as well as lymphedema. 10/28/2020 upon evaluation today patient appears to be doing okay in regard to his wounds. With that being said unfortunately does appear that his dressing has been on longer than just a day based on what I am seeing he has a tremendous amount of Betadine which appears to have been used on the gauze overlying the wound and if this literally had been just changed within the last 24 hours this would still be wet with the amount of Betadine that I see present currently. To that end I feel like that this might not of been changed very well  could have been due to agency nursing staff over the holiday weekend but again either way I did feel like it was important to reach out to the facility. Actually did speak with the director of nursing today. The patient did also come out with just his shirt and a gown over him with no pants although he did have underwear on. Nonetheless this to counter bothers me a little bit although again I am not exactly sure the reasoning behind this I did just want overlay it to the director of nursing so she could look into it and address any concerns as necessary. Other than this overall the patient does appear to be doing quite well in general all things considered he still has several areas of necrotic tissue but again nothing too tremendous at this point. He does have evidence however of blue-green drainage which has me concerned about the possibility of Pseudomonas I discussed that with him today I do believe he is probably going need to be started on Levaquin I discussed that with the director  of nursing as well. Readmission: 12/26/2020 upon evaluation today patient appears for reevaluation here in the clinic by I am good actually put this as a consult only as there is really not a whole lot I can do for this gentleman unfortunately. He has a significant issue here with significant flow restriction into the right lower extremity. 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 He has significant blue-green drainage evidence of Pseudomonas in my opinion based on what I see. Nonetheless the last time I saw him which was back in July 2022 he actually was admitted to the hospital and then supposedly I thought was being admitted for palliative care following. With that being said he had requested on 12/18/2020 to go to the hospital for further evaluation and a check on his wounds. Subsequently at the hospital he apparently actually refused most of the care recommended  which to be honest with that he really did need a amputation. He did not want to proceed with the amputation until his brother was around to be able to help and anything that needed help with. Nonetheless he said that would be 2 days. He got fairly upset in the ER and apparently even though they had a very good conversation with him about the fact that he could become septic and he could die he nonetheless discharged without being admitted to the hospital which was the recommendation. Unfortunately based on what I am seeing right now the patient appears to have significant infection of the bilateral lower extremities with blue-green drainage come from both this is not can to be resolved with the clindamycin. I think that he needs something more and specifically I believe that Cipro would be the way to go. I discussed that with him today. Patient History Information obtained from Patient. Allergies No Known Drug Allergies Social History OLLIS, DAUDELIN ([id number]) Current some day smoker, Marital Status - Divorced, Alcohol Use - Never, Drug Use - No History, Caffeine Use - Moderate. Medical History Cardiovascular Patient has history of Hypertension Endocrine Patient has history of Type II Diabetes Review of Systems (ROS) Eyes Denies complaints or symptoms of Dry Eyes, Vision Changes, Glasses / Contacts. Ear/Nose/Mouth/Throat Denies complaints or symptoms of Difficult clearing ears, Sinusitis. Hematologic/Lymphatic Denies complaints or symptoms of Bleeding / Clotting Disorders, Human Immunodeficiency Virus. Gastrointestinal Denies complaints or symptoms of Frequent diarrhea, Nausea, Vomiting. Genitourinary Denies complaints or symptoms of Kidney failure/ Dialysis, Incontinence/dribbling. Immunological Denies complaints or symptoms of Hives, Itching. Integumentary (Skin) Complains or has symptoms of Wounds, Bleeding or bruising tendency, Breakdown, Swelling. Musculoskeletal Denies  complaints or symptoms of Muscle Pain, Muscle Weakness. Neurologic Denies complaints or symptoms of Numbness/parasthesias, Focal/Weakness. Psychiatric Complains or has symptoms of Anxiety - noted in clinic with mild agitation. Denies complaints or symptoms of Claustrophobia, documented hx alcohol withdrawal syndrome Objective Constitutional sitting or standing blood pressure is within target range for patient.. pulse regular and within target range for patient.Marland Kitchen respirations regular, non- labored and within target range for patient.Marland Kitchen temperature within target range for patient.. Well-nourished and well-hydrated in no acute distress. Vitals Time Taken: 9:56 AM, Height: 70 in, Source: Stated, Weight: 179 lbs, Source: Stated, BMI: 25.7, Temperature: 98.4 F, Pulse: 104 bpm, Respiratory Rate: 16 breaths/min, Blood Pressure: 104/63 mmHg. Eyes conjunctiva clear no eyelid edema noted. pupils equal round and reactive to light and accommodation. Ears, Nose, Mouth, and Throat no gross abnormality of ear auricles or external auditory canals. normal hearing noted during conversation.  mucus membranes moist. Respiratory normal breathing without difficulty. Psychiatric this patient is able to make decisions and demonstrates good insight into disease process. Alert and Oriented x 3. pleasant and cooperative. General Notes: Upon inspection patient's wound bed actually showed signs of significant necrosis of the right foot and again I think that he is absolutely going require amputation if he wants to not become septic in short order. Even oral antibiotics I do not think are good to be the answer for him long-term and I discussed this with the patient today as well he has significant peripheral vascular disease of the right leg and unfortunately the issue here is that there is no way to revascularize short of a bypass surgery and his skin integrity apparently is not sufficient for the surgery. Either way what  he is likely going need is an above-knee amputation based on what I am hearing. Integumentary (Hair, Skin) Wound #13 status is Open. Original cause of wound was Gradually Appeared. The date acquired was: 05/10/2020. The wound is located on the Left,Distal,Posterior Lower Leg. The wound measures 20cm length x 20cm width x 0.1cm depth; 314.159cm^2 area and 31.416cm^3 volume. There is Fat Layer (Subcutaneous Tissue) exposed. There is no tunneling or undermining noted. There is a large amount of serous drainage noted. There is small (1-33%) granulation within the wound bed. There is a large (67-100%) amount of necrotic tissue within the wound bed including Eschar and Adherent Slough. Wound #14 status is Open. Original cause of wound was Gradually Appeared. The date acquired was: 05/10/2020. The wound is located on the Right,Anterior Lower Leg. The wound measures 10cm length x 7cm width x 0.1cm depth; 54.978cm^2 area and 5.498cm^3 volume. There is Fat Layer (Subcutaneous Tissue) exposed. There is no tunneling or undermining noted. There is a large amount of serous drainage noted. There is Reppert, Kenai L. ([id number]) medium (34-66%) granulation within the wound bed. There is a medium (34-66%) amount of necrotic tissue within the wound bed including Eschar and Adherent Slough. Wound #15 status is Open. Original cause of wound was Gradually Appeared. The date acquired was: 05/10/2020. The wound is located on the Right Calcaneus. The wound measures 4.5cm length x 5cm width x 1.3cm depth; 17.671cm^2 area and 22.973cm^3 volume. There is tendon and Fat Layer (Subcutaneous Tissue) exposed. There is no tunneling or undermining noted. There is a large amount of purulent drainage noted. There is small (1-33%) pink granulation within the wound bed. There is a large (67-100%) amount of necrotic tissue within the wound bed including Eschar and Adherent Slough. Wound #16 status is Open. Original cause of wound was  Gradually Appeared. The date acquired was: 05/10/2020. The wound is located on the Right,Dorsal Foot. The wound measures 19cm length x 17cm width x 1.2cm depth; 253.684cm^2 area and 304.42cm^3 volume. There is bone, joint, muscle, tendon, and Fat Layer (Subcutaneous Tissue) exposed. There is no tunneling or undermining noted. There is a large amount of purulent drainage noted. Foul odor after cleansing was noted. There is no granulation within the wound bed. There is a large (67-100%) amount of necrotic tissue within the wound bed including Eschar, Adherent Slough, Necrosis of Muscle and Necrosis of Bone. 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 DO NOT sleep in your recliner. Long hours of sitting in a recliner leads to swelling of the legs and/or potential wounds on your backside. Bathing/ Shower/ Hygiene: No tub bath. Edema Control - Lymphedema / Segmental Compressive Device / Other: Elevate leg(s) parallel to the floor when sitting. Off-Loading: Wound #15 Right Calcaneus: Open toe surgical shoe Turn and reposition every 2 hours Wound #16 Right,Dorsal Foot: Open toe surgical shoe Turn and reposition every 2 hours Medications-Please add to medication list.: P.O. Antibiotics - start new antibiotic-prescription sent to SNF-stop Clindamycin Consults ordered were: Vascular - Refer back to Vascular for surgical consult/amputation evaluation WOUND #13: - Lower Leg Wound Laterality: Left, Posterior,  Distal Cleanser: Soap and Water 1 x Per Day/30 Days Discharge Instructions: Gently cleanse wound with antibacterial soap, rinse and pat dry prior to dressing wounds Cleanser: Wound Cleanser 1 x Per Day/30 Days Discharge Instructions: Wash your hands with soap and water. Remove old dressing, discard into plastic bag and place into trash. Cleanse the wound with Wound Cleanser prior to applying a clean dressing using gauze sponges, not tissues or cotton balls. Do not scrub or use excessive force. Pat dry using gauze sponges, not tissue or cotton balls. Primary Dressing: SILVERCEL Antimicrobial Alginate Dressing, 1x12 (in/in) 1 x Per Day/30 Days Secondary Dressing: ABD Pad 5x9 (in/in) 1 x Per Day/30 Days Discharge Instructions: Cover with ABD pad Secondary Dressing: Zetuvit Plus Silicone Non-bordered 5x5 (in/in) 1 x Per Day/30 Days Secured With: 54M Medipore H Soft Cloth Surgical Tape, 2x2 (in/yd) 1 x Per Day/30 Days Secured With: [employer] Sterile or Non-Sterile 6-ply 4.5x4 (yd/yd) 1 x Per Day/30 Days Discharge Instructions: Apply Kerlix as directed WOUND #14: - Lower Leg Wound Laterality: Right, Anterior Cleanser: Soap and Water 1 x Per Day/30 Days Discharge Instructions: Gently cleanse wound with antibacterial soap, rinse and pat dry prior to dressing wounds Cleanser: Wound Cleanser 1 x Per Day/30 Days Discharge Instructions: Wash your hands with soap and water. Remove old dressing, discard into plastic bag and place into trash. Cleanse the wound with Wound Cleanser prior to applying a clean dressing using gauze sponges, not tissues or cotton balls. Do not scrub or use Mangan, Pharoah L. ([id number]) excessive force. Pat dry using gauze sponges, not tissue or cotton balls. Primary Dressing: SILVERCEL Antimicrobial Alginate Dressing, 1x12 (in/in) 1 x Per Day/30 Days Secondary Dressing: ABD Pad 5x9 (in/in) 1 x Per Day/30 Days Discharge Instructions: Cover with ABD pad Secondary Dressing:  Zetuvit Plus Silicone Non-bordered 5x5 (in/in) 1 x Per Day/30 Days Secured With: 54M Medipore H Soft Cloth Surgical Tape, 2x2 (in/yd) 1 x Per Day/30 Days Secured With: Kerlix Roll Sterile or Non-Sterile 6-ply 4.5x4 (yd/yd) 1 x Per Day/30 Days Discharge Instructions: Apply Kerlix as directed WOUND #15: - Calcaneus Wound Laterality: Right Cleanser: Soap and Water 1 x Per Day/30 Days Discharge Instructions: Gently cleanse wound with antibacterial soap, rinse and pat dry prior to dressing wounds Cleanser: Wound Cleanser 1 x Per Day/30 Days Discharge Instructions: Wash your hands with soap and water. Remove old dressing, discard into plastic bag and place into trash. Cleanse the wound with Wound Cleanser prior to applying a clean dressing using gauze sponges, not tissues or cotton balls. Do not scrub or use excessive force. Pat dry using gauze sponges, not tissue or cotton balls. Primary Dressing:  SILVERCEL Antimicrobial Alginate Dressing, 1x12 (in/in) 1 x Per Day/30 Days Secondary Dressing: ABD Pad 5x9 (in/in) 1 x Per Day/30 Days Discharge Instructions: Cover with ABD pad Secondary Dressing: Zetuvit Plus Silicone Non-bordered 5x5 (in/in) 1 x Per Day/30 Days Secured With: 39M Medipore H Soft Cloth Surgical Tape, 2x2 (in/yd) 1 x Per Day/30 Days Secured With: [employer] Sterile or Non-Sterile 6-ply 4.5x4 (yd/yd) 1 x Per Day/30 Days Discharge Instructions: Apply Kerlix as directed WOUND #16: - Foot Wound Laterality: Dorsal, Right Cleanser: Soap and Water 1 x Per Day/30 Days Discharge Instructions: Gently cleanse wound with antibacterial soap, rinse and pat dry prior to dressing wounds Cleanser: Wound Cleanser 1 x Per Day/30 Days Discharge Instructions: Wash your hands with soap and water. Remove old dressing, discard into plastic bag and place into trash. Cleanse the wound with Wound Cleanser prior to applying a clean dressing using gauze sponges, not tissues or cotton balls. Do not scrub or  use excessive force. Pat dry using gauze sponges, not tissue or cotton balls. Primary Dressing: SILVERCEL Antimicrobial Alginate Dressing, 1x12 (in/in) 1 x Per Day/30 Days Secondary Dressing: ABD Pad 5x9 (in/in) 1 x Per Day/30 Days Discharge Instructions: Cover with ABD pad Secondary Dressing: Zetuvit Plus Silicone Non-bordered 5x5 (in/in) 1 x Per Day/30 Days Secured With: 39M Medipore H Soft Cloth Surgical Tape, 2x2 (in/yd) 1 x Per Day/30 Days Secured With: Kerlix Roll Sterile or Non-Sterile 6-ply 4.5x4 (yd/yd) 1 x Per Day/30 Days Discharge Instructions: Apply Kerlix as directed 1. I did feel like and had a conversation with the patient today as well that his ideal way to manage this wound unfortunately is really good involve again I do not really see a way around this as far as the right leg is concerned. The left leg seems to be more of a cellulitis type issue with weeping and drainage due to lymphedema and to that end I think that we can definitely work on this as far as trying to get things moving in the right direction. I do not see any evidence of active infection systemically at this point either which is also great news. Nonetheless I discussed with the patient that if he is not diligent in getting this taken care of and to be honest going forward with the amputation that I think it may be unlikely that he would still be alive by the end of the year. Again I cannot claim that for certain but nonetheless this is a serious issue that needs to be managed if he wants to continue living. He seemed voiced understanding and states that he really should probably proceed with the amputation he wants to see if I will make that referral. 2. We are making referral back to vascular and I did actually discuss with the vascular provider today Vivia Birmingham that the patient is going to need to be going back to see them for amputation. She is going to get in touch with the patient and make this appointment. I did  send over the referral as well. We will see the patient back for follow-up visit as needed as to be honest there really is not much from a wound care perspective that I can offer at this point this is really a surgical issue at this time. Electronic Signature(s) Signed: 12/26/2020 4:00:45 PM By: Lenda Kelp PA-C Entered By: Lenda Kelp on 12/26/2020 16:00:44 Juran, Dollene Cleveland ([id number]) -------------------------------------------------------------------------------- ROS/PFSH Details Patient Name: Brad Frost Date of Service: 12/26/2020 9:45 AM Medical Record  Number: [id number] Patient Account Number: [id number] Date of Birth/Sex: [birth year] (65 y.o. M) Treating RN: Hansel Feinstein Primary Care Provider: [name], [name] Other Clinician: Referring Provider: [name], [name] Treating Provider/Extender: Rowan Blase in Treatment: 0 Information Obtained From Patient Eyes Complaints and Symptoms: Negative for: Dry Eyes; Vision Changes; Glasses / Contacts Ear/Nose/Mouth/Throat Complaints and Symptoms: Negative for: Difficult clearing ears; Sinusitis Hematologic/Lymphatic Complaints and Symptoms: Negative for: Bleeding / Clotting Disorders; Human Immunodeficiency Virus Gastrointestinal Complaints and Symptoms: Negative for: Frequent diarrhea; Nausea; Vomiting Genitourinary Complaints and Symptoms: Negative for: Kidney failure/ Dialysis; Incontinence/dribbling Immunological Complaints and Symptoms: Negative for: Hives; Itching Integumentary (Skin) Complaints and Symptoms: Positive for: Wounds; Bleeding or bruising tendency; Breakdown; Swelling Musculoskeletal Complaints and Symptoms: Negative for: Muscle Pain; Muscle Weakness Neurologic Complaints and Symptoms: Negative for: Numbness/parasthesias; Focal/Weakness Psychiatric Complaints and Symptoms: Positive for: Anxiety - noted in clinic with mild agitation Negative for: Claustrophobia Review of System  Notes: documented hx alcohol withdrawal syndrome Respiratory [place], JERMINE BIBBEE ([id number]) Cardiovascular Medical History: Positive for: Hypertension Endocrine Medical History: Positive for: Type II Diabetes Oncologic Immunizations Pneumococcal Vaccine: Received Pneumococcal Vaccination: No Implantable Devices None Family and Social History Current some day smoker; Marital Status - Divorced; Alcohol Use: Never; Drug Use: No History; Caffeine Use: Moderate; Financial Concerns: No; Food, Clothing or Shelter Needs: No; Support System Lacking: No; Transportation Concerns: No Electronic Signature(s) Signed: 12/26/2020 1:30:21 PM By: Hansel Feinstein Signed: 12/26/2020 4:48:27 PM By: Lenda Kelp PA-C Entered By: Hansel Feinstein on 12/26/2020 10:29:30 Patane, Dollene Cleveland ([id number]) -------------------------------------------------------------------------------- SuperBill Details Patient Name: Brad Frost. Date of Service: 12/26/2020 Medical Record Number: [id number] Patient Account Number: [id number] Date of Birth/Sex: 1956-01-25 (65 y.o. M) Treating RN: Hansel Feinstein Primary Care Provider: [name], [name] Other Clinician: Referring Provider: [name], [name] Treating Provider/Extender: Rowan Blase in Treatment: 0 Diagnosis Coding ICD-10 Codes Code Description I73.89 Other specified peripheral vascular diseases I87.2 Venous insufficiency (chronic) (peripheral) L97.822 Non-pressure chronic ulcer of other part of left lower leg with fat layer exposed L97.812 Non-pressure chronic ulcer of other part of right lower leg with fat layer exposed L89.614 Pressure ulcer of right heel, stage 4 Z79.01 Long term (current) use of anticoagulants L97.514 Non-pressure chronic ulcer of other part of right foot with necrosis of bone Facility Procedures CPT4 Code: 97673419 Description: 37902 - WOUND CARE VISIT-LEV 5 EST PT Modifier: Quantity: 1 Physician Procedures CPT4 Code: 4097353 Description:  99214 - WC PHYS LEVEL 4 - EST PT Modifier: Quantity: 1 CPT4 Code: Description: ICD-10 Diagnosis Description I73.89 Other specified peripheral vascular diseases I87.2 Venous insufficiency (chronic) (peripheral) L97.822 Non-pressure chronic ulcer of other part of left lower leg with fat lay L97.812 Non-pressure chronic  ulcer of other part of right lower leg with fat la Modifier: er exposed yer exposed Quantity: Electronic Signature(s) Signed: 12/26/2020 4:01:04 PM By: Lenda Kelp PA-C Previous Signature: 12/26/2020 1:30:21 PM Version By: Hansel Feinstein Entered By: Lenda Kelp on 12/26/2020 16:01:04

## 2020-12-26 NOTE — Telephone Encounter (Signed)
Called from wound care (Dr. Hayden Rasmussen office). Dr Larina Bras requested to speak with GS or [facility] on behalf of patient. I advised her that [facility] was the only provider here today and is seeing patients but I would relay the message in hopes she can return the phone call at her earliest convenience.   This note is for documentation purposes only.  PH:[phone]

## 2020-12-26 NOTE — Progress Notes (Addendum)
JAVEION, CANNEDY ([id number]) Visit Report for 12/26/2020 Allergy List Details Patient Name: Brad Frost, Brad Frost. Date of Service: 12/26/2020 9:45 AM Medical Record Number: [id number] Patient Account Number: [id number] Date of Birth/Sex: 1956-03-30 (65 y.o. M) Treating RN: Hansel Feinstein Primary Care Emina Ribaudo: [name], [name] Other Clinician: Referring Julio Storr: [name], [name] Treating Anani Gu/Extender: Allen Derry Weeks in Treatment: 0 Allergies Active Allergies No Known Drug Allergies Allergy Notes Electronic Signature(s) Signed: 12/26/2020 1:30:21 PM By: Hansel Feinstein Entered By: Hansel Feinstein on 12/26/2020 09:58:08 [place], Brad Frost ([id number]) -------------------------------------------------------------------------------- Arrival Information Details Patient Name: Brad Frost. Date of Service: 12/26/2020 9:45 AM Medical Record Number: [id number] Patient Account Number: [id number] Date of Birth/Sex: 08-Dec-1955 (65 y.o. M) Treating RN: Hansel Feinstein Primary Care Leeya Rusconi: [name], [name] Other Clinician: Referring Svea Pusch: [name], [name] Treating Mikenna Bunkley/Extender: Rowan Blase in Treatment: 0 Visit Information Patient Arrived: Wheel Chair Arrival Time: 09:52 Accompanied By: self/driver Transfer Assistance: EasyPivot Patient Lift Patient Identification Verified: Yes Secondary Verification Process Completed: Yes Patient Has Alerts: Yes Patient Alerts: DIABETIC History Since Last Visit Added or deleted any medications: No Had a fall or experienced change in activities of daily living that may affect risk of falls: No Hospitalized since last visit: No Has Dressing in Place as Prescribed: Yes Has Compression in Place as Prescribed: Yes Electronic Signature(s) Signed: 12/26/2020 1:30:21 PM By: Hansel Feinstein Entered By: Hansel Feinstein on 12/26/2020 09:56:53 [place], Brad Frost ([id number]) -------------------------------------------------------------------------------- Clinic Level of Care  Assessment Details Patient Name: Brad Frost. Date of Service: 12/26/2020 9:45 AM Medical Record Number: [id number] Patient Account Number: [id number] Date of Birth/Sex: 1955-10-29 (65 y.o. M) Treating RN: Hansel Feinstein Primary Care Kataleia Quaranta: [name], [name] Other Clinician: Referring Kaid Seeberger: [name], [name] Treating Delisa Finck/Extender: Rowan Blase in Treatment: 0 Clinic Level of Care Assessment Items TOOL 2 Quantity Score []  - Use when only an EandM is performed on the INITIAL visit 0 ASSESSMENTS - Nursing Assessment / Reassessment X - General Physical Exam (combine w/ comprehensive assessment (listed just below) when performed on new 1 20 pt. evals) X- 1 25 Comprehensive Assessment (HX, ROS, Risk Assessments, Wounds Hx, etc.) ASSESSMENTS - Wound and Skin Assessment / Reassessment []  - Simple Wound Assessment / Reassessment - one wound 0 X- 4 5 Complex Wound Assessment / Reassessment - multiple wounds []  - 0 Dermatologic / Skin Assessment (not related to wound area) ASSESSMENTS - Ostomy and/or Continence Assessment and Care []  - Incontinence Assessment and Management 0 []  - 0 Ostomy Care Assessment and Management (repouching, etc.) PROCESS - Coordination of Care X - Simple Patient / Family Education for ongoing care 1 15 []  - 0 Complex (extensive) Patient / Family Education for ongoing care X- 1 10 Staff obtains , Records, Test Results / Process Orders X- 1 10 Staff telephones HHA, Nursing Homes / Clarify orders / etc []  - 0 Routine Transfer to another Facility (non-emergent condition) []  - 0 Routine Hospital Admission (non-emergent condition) X- 1 15 New Admissions / / Ordering NPWT, Apligraf, etc. []  - 0 Emergency Hospital Admission (emergent condition) X- 1 10 Simple Discharge Coordination []  - 0 Complex (extensive) Discharge Coordination PROCESS - Special Needs []  - Pediatric / Minor Patient Management 0 []  - 0 Isolation Patient  Management []  - 0 Hearing / Language / Visual special needs []  - 0 Assessment of Community assistance (transportation, D/C planning, etc.) []  - 0 Additional assistance / Altered mentation []  - 0 Support Surface(s) Assessment (bed, cushion, seat, etc.) INTERVENTIONS - Wound Cleansing / Measurement []  -  Wound Imaging (photographs - any number of wounds) 0 []  - 0 Wound Tracing (instead of photographs) []  - 0 Simple Wound Measurement - one wound X- 4 5 Complex Wound Measurement - multiple wounds Granholm, Brad L. ( ) []  - 0 Simple Wound Cleansing - one wound X- 4 5 Complex Wound Cleansing - multiple wounds INTERVENTIONS - Wound Dressings []  - Small Wound Dressing one or multiple wounds 0 []  - 0 Medium Wound Dressing one or multiple wounds X- 4 20 Large Wound Dressing one or multiple wounds []  - 0 Application of Medications - injection INTERVENTIONS - Miscellaneous []  - External ear exam 0 []  - 0 Specimen Collection (cultures, biopsies, blood, body fluids, etc.) []  - 0 Specimen(s) / Culture(s) sent or taken to Lab for analysis []  - 0 Patient Transfer (multiple staff / / Similar devices) []  - 0 Simple Staple / Suture removal (25 or less) []  - 0 Complex Staple / Suture removal (26 or more) []  - 0 Hypo / Hyperglycemic Management (close monitor of Blood Glucose) []  - 0 Ankle / Brachial Index (ABI) - do not check if billed separately Has the patient been seen at the hospital within the last three years: Yes Total Score: 245 Level Of Care: New/Established - Level 5 Electronic Signature(s) Signed: 12/26/2020 1:30:21 PM By: Entered By: on 12/26/2020 11:04:47 Alb[place], ( ) -------------------------------------------------------------------------------- Encounter Discharge Information Details Patient Name: . Date of Service: 12/26/2020 9:45 AM Medical Record Number: Patient Account  Number: Nurse, adult Date of Birth/Sex: [birth year] (65 y.o. M) Treating RN: Primary Care Crysten Kaman: [name], Other Clinician: Referring Bevelyn Arriola: [name], 02/25/2021 Treating Daisy Lites/Extender: Hansel Feinstein in Treatment: 0 Encounter Discharge Information Items Discharge Condition: Stable Ambulatory Status: Wheelchair Discharge Destination: Skilled Nursing Facility Telephoned: No Orders Sent: Yes Transportation: Other Accompanied By: self/driver SNF Schedule Follow-up Appointment: Yes Clinical Summary of Care: Patient Declined Electronic Signature(s) Signed: 12/26/2020 1:30:21 PM By: 02/25/2021 Entered By: Brad Frost on 12/26/2020 11:10:01 [place], Brad Frost (02/25/2021) -------------------------------------------------------------------------------- Lower Extremity Assessment Details Patient Name: [id number]. Date of Service: 12/26/2020 9:45 AM Medical Record Number: 11/28/1955 Patient Account Number: (72 Date of Birth/Sex: Jul 02, 1955 (65 y.o. M) Treating RN: [name] Primary Care Ottis Vacha: [name], Rowan Blase Other Clinician: Referring Cash Meadow: [name], 02/25/2021 Treating Kyuss Hale/Extender: Hansel Feinstein in Treatment: 0 Edema Assessment Assessed: [Left: Yes] [Right: Yes] Edema: [Left: Yes] [Right: Yes] Calf Left: Right: Point of Measurement: 33 cm From Medial Instep 40 cm 39 cm Ankle Left: Right: Point of Measurement: 10 cm From Medial Instep 29.5 cm 27 cm Knee To Floor Left: Right: From Medial Instep 43 cm 43 cm Vascular Assessment Pulses: Dorsalis Pedis Palpable: [Left:No Inaudible] [Right:No Inaudible] Electronic Signature(s) Signed: 12/26/2020 1:30:21 PM By: 02/25/2021 Entered By: Brad Frost on 12/26/2020 10:22:51 Bellucci, Brad Frost (02/25/2021) -------------------------------------------------------------------------------- Multi Wound Chart Details Patient Name: [id number]. Date of Service: 12/26/2020 9:45 AM Medical Record Number:  11/28/1955 Patient Account Number: (72 Date of Birth/Sex: [birth year] (65 y.o. 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 Secondary Etiology: Lymphedema Lymphedema Diabetic Wound/Ulcer  of the Lower Extremity Comorbid History: Hypertension, Type II Diabetes Hypertension, Type II Diabetes Hypertension, Type II Diabetes Date Acquired: 05/10/2020 05/10/2020 05/10/2020 Weeks of Treatment: 0 0 0 Wound Status: Open Open Open Pending Amputation on No No Yes Presentation: Measurements L x W x D (cm) 20x20x0.1 10x7x0.1 4.5x5x1.3 Area (cm) : 314.159 54.978 17.671 Volume (cm) : 31.416 5.498 22.973 % Reduction in Area: N/A N/A N/A % Reduction in Volume: N/A N/A N/A Classification: Full Thickness Without Exposed Full Thickness Without Exposed Category/Stage IV Support Structures Support Structures Exudate Amount: Large Large Large Exudate Type: Serous Serous Purulent Exudate Color: amber amber yellow, brown, green Foul Odor After Cleansing: No No No Odor Anticipated Due to Product N/A N/A N/A Use: Granulation Amount: Small (1-33%) Medium (34-66%) Small (1-33%) Granulation Quality: N/A N/A Pink Necrotic Amount: Large (67-100%) Medium (34-66%) Large (67-100%) Necrotic Tissue: Eschar, Adherent Slough Eschar, Adherent Slough Eschar, Adherent Slough Exposed Structures: Fat Layer (Subcutaneous Tissue): Fat Layer (Subcutaneous Tissue): Fat Layer  (Subcutaneous Tissue): Yes Yes Yes Fascia: No Fascia: No Tendon: Yes Tendon: No Tendon: No Fascia: No Muscle: No Muscle: No Muscle: No Joint: No Joint: No Joint: No Bone: No Bone: No Bone: No Wound Number: 16 N/A N/A Photos: N/A N/A BRACEN, SCHUM ([id number]) Wound Location: Right, Dorsal Foot N/A N/A Wounding Event: Gradually Appeared N/A N/A 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/A Extremity Secondary Etiology: N/A N/A N/A Comorbid History: Hypertension, Type II Diabetes N/A N/A Date Acquired: 05/10/2020 N/A N/A Weeks of Treatment: 0 N/A N/A Wound Status: Open N/A N/A Pending Amputation on Yes N/A N/A Presentation: Measurements L x W x D (cm) 19x17x1.2 N/A N/A Area (cm) : 253.684 N/A N/A Volume (cm) : 304.42 N/A N/A % Reduction in Area: 0.00% N/A N/A % Reduction in Volume: 0.00% N/A N/A Classification: Grade 4 N/A N/A Exudate Amount: Large N/A N/A Exudate Type: Purulent N/A N/A Exudate Color: yellow, brown, green N/A N/A Foul Odor After Cleansing: Yes N/A N/A Odor Anticipated Due to Product No N/A N/A Use: Granulation Amount: None Present (0%) N/A N/A Granulation Quality: N/A N/A N/A Necrotic Amount: Large (67-100%) N/A N/A Necrotic Tissue: Eschar, Adherent Slough N/A N/A Exposed Structures: Fat Layer (Subcutaneous Tissue): N/A N/A Yes Tendon: Yes Muscle: Yes Joint: Yes Bone: Yes Fascia: No Treatment Notes Electronic Signature(s) Signed: 12/26/2020 1:30:21 PM By: Hansel Feinstein Entered By: Hansel Feinstein on 12/26/2020 10:37:47 [place], Brad Frost ([id number]) -------------------------------------------------------------------------------- Multi-Disciplinary Care Plan Details Patient Name: Brad Frost. Date of Service: 12/26/2020 9:45 AM Medical Record Number: [id number] Patient Account Number: [id number] Date of Birth/Sex: Sep 16, 1955 (65 y.o. M) Treating RN: Hansel Feinstein Primary Care Treina Arscott: [name], [name] Other  Clinician: Referring Sherryll Skoczylas: [name], [name] Treating Sybrina Laning/Extender: Allen Derry Weeks in Treatment: 0 Active Inactive Electronic Signature(s) Signed: 12/30/2020 10:53:52 AM By: Hansel Feinstein Previous Signature: 12/26/2020 1:30:21 PM Version By: Hansel Feinstein Entered By: Hansel Feinstein on 12/30/2020 10:53:52 [place], Brad Frost ([id number]) -------------------------------------------------------------------------------- Pain Assessment Details Patient Name: Brad Frost. Date of Service: 12/26/2020 9:45 AM Medical Record Number: [id number] Patient Account Number: [id number] Date of Birth/Sex: 1956/01/11 (65 y.o. M) Treating RN: Hansel Feinstein Primary Care Serenah Mill: [name], [name] Other Clinician: Referring Dick Hark: [name], [name] Treating Nneoma Harral/Extender: Rowan Blase in Treatment: 0 Active Problems Location of Pain Severity and Description of Pain Patient Has Paino Yes Site Locations Rate the pain. Current Pain Level: 7 Pain Management and Medication Current Pain Management: Electronic Signature(s) Signed: 12/26/2020 1:30:21 PM By: Hansel Feinstein Entered ByHansel Feinstein on 12/26/2020  09:57:09 Brad Frost, Ricahrd L. ([id number]) -------------------------------------------------------------------------------- Patient/Caregiver Education Details Patient Name: Brad Frost, Brad L. Date of Service: 12/26/2020 9:45 AM Medical Record Number: [id number] Patient Account Number: [id number] Date of Birth/Gender: 12/27/1955 [age] y.o. M) Treating RN: Hansel FeinsteinBishop, Joy Primary Care Physician: [name], [place] Other Clinician: Referring Physician: [name], [place] Treating Physician/Extender: Rowan BlaseStone, Hoyt Weeks in Treatment: 0 Education Assessment Education Provided To: Patient and Caregiver Education Topics Provided Basic Hygiene: Smoking and Wound Healing: [facility]: Wound/Skin Impairment: Electronic Signature(s) Signed: 12/26/2020 1:30:21 PM By: Hansel FeinsteinBishop, Joy Entered By: Hansel FeinsteinBishop, Joy on 12/26/2020  11:05:13 [place], Brad ClevelandFREDERICK L. ([id number]) -------------------------------------------------------------------------------- Wound Assessment Details Patient Name: Brad RichardENOCH, Brad L. Date of Service: 12/26/2020 9:45 AM Medical Record Number: [id number] Patient Account Number: [id number] Date of Birth/Sex: 08/09/1955 12(65 y.o. M) Treating RN: Hansel FeinsteinBishop, Joy Primary Care Jaxsin Bottomley: [name], [place] Other Clinician: Referring Craven Crean: [name], [place] Treating Jamesia Linnen/Extender: Rowan BlaseStone, Hoyt Weeks in Treatment: 0 Wound Status Wound Number: 13 Primary Etiology: Venous Leg Ulcer Wound Location: Left, Distal, Posterior Lower Leg Secondary Etiology: Lymphedema Wounding Event: Gradually Appeared Wound Status: Open Date Acquired: 05/10/2020 Comorbid History: Hypertension, Type II Diabetes Weeks Of Treatment: 0 Clustered Wound: No Photos Wound Measurements Length: (cm) 20 Width: (cm) 20 Depth: (cm) 0.1 Area: (cm) 314.159 Volume: (cm) 31.416 % Reduction in Area: % Reduction in Volume: Tunneling: No Undermining: No Wound Description Classification: Full Thickness Without Exposed Support Structu Exudate Amount: Large Exudate Type: Serous Exudate Color: amber res Foul Odor After Cleansing: No Slough/Fibrino Yes Wound Bed Granulation Amount: Small (1-33%) Exposed Structure Necrotic Amount: Large (67-100%) Fascia Exposed: No Necrotic Quality: Eschar, Adherent Slough Fat Layer (Subcutaneous Tissue) Exposed: Yes Tendon Exposed: No Muscle Exposed: No Joint Exposed: No Bone Exposed: No Electronic Signature(s) Signed: 12/26/2020 1:30:21 PM By: Hansel FeinsteinBishop, Joy Entered By: Hansel FeinsteinBishop, Joy on 12/26/2020 10:10:20 Frickey, Brad ClevelandFREDERICK L. ([id number]) -------------------------------------------------------------------------------- Wound Assessment Details Patient Name: Brad RichardENOCH, Brad L. Date of Service: 12/26/2020 9:45 AM Medical Record Number: [id number] Patient Account Number: [id number] Date of Birth/Sex: 10/19/1955  42(65 y.o. M) Treating RN: Hansel FeinsteinBishop, Joy Primary Care Murrel Bertram: [name], [place] Other Clinician: Referring Dodger Sinning: [name], [place] Treating Zacory Fiola/Extender: Rowan BlaseStone, Hoyt Weeks in Treatment: 0 Wound Status Wound Number: 14 Primary Etiology: Venous Leg Ulcer Wound Location: Right, Anterior Lower Leg Secondary Etiology: Lymphedema Wounding Event: Gradually Appeared Wound Status: Open Date Acquired: 05/10/2020 Comorbid History: Hypertension, Type II Diabetes Weeks Of Treatment: 0 Clustered Wound: No Photos Wound Measurements Length: (cm) 10 Width: (cm) 7 Depth: (cm) 0.1 Area: (cm) 54.978 Volume: (cm) 5.498 % Reduction in Area: % Reduction in Volume: Tunneling: No Undermining: No Wound Description Classification: Full Thickness Without Exposed Support Structu Exudate Amount: Large Exudate Type: Serous Exudate Color: amber res Foul Odor After Cleansing: No Slough/Fibrino Yes Wound Bed Granulation Amount: Medium (34-66%) Exposed Structure Necrotic Amount: Medium (34-66%) Fascia Exposed: No Necrotic Quality: Eschar, Adherent Slough Fat Layer (Subcutaneous Tissue) Exposed: Yes Tendon Exposed: No Muscle Exposed: No Joint Exposed: No Bone Exposed: No Electronic Signature(s) Signed: 12/26/2020 1:30:21 PM By: Hansel FeinsteinBishop, Joy Entered By: Hansel FeinsteinBishop, Joy on 12/26/2020 10:12:08 Dickard, Brad ClevelandFREDERICK L. ([id number]) -------------------------------------------------------------------------------- Wound Assessment Details Patient Name: Brad RichardENOCH, Brad L. Date of Service: 12/26/2020 9:45 AM Medical Record Number: [id number] Patient Account Number: [id number] Date of Birth/Sex: 07/07/1955 [age] y.o. M) Treating RN: Hansel FeinsteinBishop, Joy Primary Care Tymia Streb: [name], [place] Other Clinician: Referring Fraya Ueda: [name], [place] Treating Jaliel Deavers/Extender: Rowan BlaseStone, Hoyt Weeks in Treatment: 0 Wound Status Wound Number: 15 Primary Etiology: Pressure Ulcer Wound Location: Right Calcaneus Secondary Etiology: Diabetic Wound/Ulcer  of the Lower Extremity Wounding  Event: Gradually Appeared Wound Status: Open Date Acquired: 05/10/2020 Comorbid History: Hypertension, Type II Diabetes Weeks Of Treatment: 0 Clustered Wound: No Photos Wound Measurements Length: (cm) 4.5 Width: (cm) 5 Depth: (cm) 1.3 Area: (cm) 17.671 Volume: (cm) 22.973 % Reduction in Area: % Reduction in Volume: Tunneling: No Undermining: No Wound Description Classification: Category/Stage IV Exudate Amount: Large Exudate Type: Purulent Exudate Color: yellow, brown, green Foul Odor After Cleansing: No Slough/Fibrino Yes Wound Bed Granulation Amount: Small (1-33%) Exposed Structure Granulation Quality: Pink Fascia Exposed: No Necrotic Amount: Large (67-100%) Fat Layer (Subcutaneous Tissue) Exposed: Yes Necrotic Quality: Eschar, Adherent Slough Tendon Exposed: Yes Muscle Exposed: No Joint Exposed: No Bone Exposed: No Electronic Signature(s) Signed: 12/26/2020 1:30:21 PM By: Hansel Feinstein Entered By: Hansel Feinstein on 12/26/2020 10:15:41 Grau, Brad Frost ([id number]) -------------------------------------------------------------------------------- Wound Assessment Details Patient Name: Brad Frost. Date of Service: 12/26/2020 9:45 AM Medical Record Number: [id number] Patient Account Number: [id number] Date of Birth/Sex: April 15, 1956 (65 y.o. M) Treating RN: Hansel Feinstein Primary Care Jovie Swanner: [name], [name] Other Clinician: Referring Santresa Levett: [name], [name] Treating Miyonna Ormiston/Extender: Rowan Blase in Treatment: 0 Wound Status Wound Number: 16 Primary Etiology: Diabetic Wound/Ulcer of the Lower Extremity Wound Location: Right, Dorsal Foot Wound Status: Open Wounding Event: Gradually Appeared Comorbid History: Hypertension, Type II Diabetes Date Acquired: 05/10/2020 Weeks Of Treatment: 0 Clustered Wound: No Pending Amputation On Presentation Photos Wound Measurements Length: (cm) 19 Width: (cm) 17 Depth: (cm) 1.2 Area: (cm)  253.684 Volume: (cm) 304.42 % Reduction in Area: 0% % Reduction in Volume: 0% Tunneling: No Undermining: No Wound Description Classification: Grade 4 Exudate Amount: Large Exudate Type: Purulent Exudate Color: yellow, brown, green Foul Odor After Cleansing: Yes Due to Product Use: No Slough/Fibrino Yes Wound Bed Granulation Amount: None Present (0%) Exposed Structure Necrotic Amount: Large (67-100%) Fascia Exposed: No Necrotic Quality: Eschar, Adherent Slough, Bone Fat Layer (Subcutaneous Tissue) Exposed: Yes Tendon Exposed: Yes Muscle Exposed: Yes Necrosis of Muscle: Yes Joint Exposed: Yes Bone Exposed: Yes Electronic Signature(s) Signed: 12/26/2020 1:30:21 PM By: Hansel Feinstein Entered By: Hansel Feinstein on 12/26/2020 10:21:27 Knoke, Brad Frost ([id number]) -------------------------------------------------------------------------------- Vitals Details Patient Name: Brad Frost. Date of Service: 12/26/2020 9:45 AM Medical Record Number: [id number] Patient Account Number: [id number] Date of Birth/Sex: [birth year] (65 y.o. M) Treating RN: Hansel Feinstein Primary Care Tymir Terral: [name], [name] Other Clinician: Referring Kaius Daino: [name], [name] Treating Ericson Nafziger/Extender: Rowan Blase in Treatment: 0 Vital Signs Time Taken: 09:56 Temperature (F): 98.4 Height (in): 70 Pulse (bpm): 104 Source: Stated Respiratory Rate (breaths/min): 16 Weight (lbs): 179 Blood Pressure (mmHg): 104/63 Source: Stated Reference Range: 80 - 120 mg / dl Body Mass Index (BMI): 25.7 Electronic Signature(s) Signed: 12/26/2020 1:30:21 PM By: Hansel Feinstein Entered ByHansel Feinstein on 12/26/2020 09:57:41

## 2020-12-26 NOTE — Telephone Encounter (Signed)
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  He is at [facility].  We need to get him in soon...hopefully next week.  No studies necessary

## 2020-12-31 DIAGNOSIS — I70229 Atherosclerosis of native arteries of extremities with rest pain, unspecified extremity: Secondary | ICD-10-CM | POA: Insufficient documentation

## 2020-12-31 DIAGNOSIS — E785 Hyperlipidemia, unspecified: Secondary | ICD-10-CM | POA: Insufficient documentation

## 2020-12-31 NOTE — Progress Notes (Deleted)
MRN : [id number]  Brad Frost is a 65 y.o. (10/09/1955) male who presents with chief complaint of talk about leg.  History of Present Illness:   Patient returns to the office for discussion regarding right above-knee amputation.  Angiogram performed 10/10/2020 showed: Normal common femoral artery with a large profunda femoris artery but a flush occlusion of the SFA.  There is total occlusion of the superficial femoral artery and popliteal artery with faint reconstitution of a diseased peroneal artery and posterior tibial artery.  The posterior tibial artery occluded again after reconstitution above the ankle and did not fill the foot.  The peroneal artery went to its typical course to the ankle.  No outpatient medications have been marked as taking for the 01/01/21 encounter (Appointment) with Gilda Crease, Latina Craver, MD.    Past Medical History:  Diagnosis Date   Diabetes mellitus without complication [facility])    Hypertension    Peripheral vascular disease [facility])     Past Surgical History:  Procedure Laterality Date   LEG SURGERY  August 20, 2014   Right Leg  BPG   LOWER EXTREMITY ANGIOGRAPHY Left 09/19/2020   Procedure: Lower Extremity Angiography;  Surgeon: Renford Dills, MD;  Location: [facility] INVASIVE CV LAB;  Service: Cardiovascular;  Laterality: Left;   LOWER EXTREMITY ANGIOGRAPHY Right 10/10/2020   Procedure: Lower Extremity Angiography;  Surgeon: Annice Needy, MD;  Location: [facility] INVASIVE CV LAB;  Service: Cardiovascular;  Laterality: Right;    Social History Social History   Tobacco Use   Smoking status: Former    Types: Cigarettes   Smokeless tobacco: Never  Substance Use Topics   Alcohol use: Yes   Drug use: No    Family History Family History  Problem Relation Age of Onset   Hypertension Mother     Allergies  Allergen Reactions   Sildenafil      REVIEW OF SYSTEMS (Negative unless checked)  Constitutional: [] Weight loss  [] Fever  [] Chills Cardiac:  [] Chest pain   [] Chest pressure   [] Palpitations   [] Shortness of breath when laying flat   [] Shortness of breath with exertion. Vascular:  [x] Pain in legs with walking   [x] Pain in legs at rest  [] History of DVT   [] Phlebitis   [] Swelling in legs   [] Varicose veins   [] Non-healing ulcers Pulmonary:   [] Uses home oxygen   [] Productive cough   [] Hemoptysis   [] Wheeze  [] COPD   [] Asthma Neurologic:  [] Dizziness   [] Seizures   [] History of stroke   [] History of TIA  [] Aphasia   [] Vissual changes   [] Weakness or numbness in arm   [] Weakness or numbness in leg Musculoskeletal:   [] Joint swelling   [] Joint pain   [] Low back pain Hematologic:  [] Easy bruising  [] Easy bleeding   [] Hypercoagulable state   [] Anemic Gastrointestinal:  [] Diarrhea   [] Vomiting  [] Gastroesophageal reflux/heartburn   [] Difficulty swallowing. Genitourinary:  [] Chronic kidney disease   [] Difficult urination  [] Frequent urination   [] Blood in urine Skin:  [] Rashes   [] Ulcers  Psychological:  [] History of anxiety   []  History of major depression.  Physical Examination  There were no vitals filed for this visit. There is no height or weight on file to calculate BMI. Gen: WD/WN, NAD Head: [place]/AT, No temporalis wasting.  Ear/Nose/Throat: Hearing grossly intact, nares w/o erythema or drainage Eyes: PER, EOMI, sclera nonicteric.  Neck: Supple, no masses.  No bruit or JVD.  Pulmonary:  Good air movement, no audible wheezing, no use  of accessory muscles.  Cardiac: RRR, normal S1, S2, no Murmurs. Vascular:   Vessel Right Left  Radial Palpable Palpable  PT Not Palpable Not Palpable  DP Not Palpable Not Palpable  Gastrointestinal: soft, non-distended. No guarding/no peritoneal signs.  Musculoskeletal: M/S 5/5 throughout.  No visible deformity.  Neurologic: CN 2-12 intact. Pain and light touch intact in extremities.  Symmetrical.  Speech is fluent. Motor exam as listed above. Psychiatric: Judgment intact, Mood & affect appropriate  for pt's clinical situation. Dermatologic: No rashes or ulcers noted.  No changes consistent with cellulitis.   CBC Lab Results  Component Value Date   WBC 9.0 10/10/2020   HGB 7.8 (L) 10/10/2020   HCT 26.1 (L) 10/10/2020   MCV 79.8 (L) 10/10/2020   PLT 437 (H) 10/10/2020    BMET    Component Value Date/Time   NA 140 10/10/2020 0618   K 3.6 10/10/2020 0618   CL 109 10/10/2020 0618   CO2 25 10/10/2020 0618   GLUCOSE 86 10/10/2020 0618   BUN 6 (L) 10/10/2020 0618   CREATININE 0.75 10/10/2020 0618   CALCIUM 8.1 (L) 10/10/2020 0618   GFRNONAA >60 10/10/2020 0618   GFRAA >60 04/14/2019 1345   CrCl cannot be calculated (Patient's most recent lab result is older than the maximum 21 days allowed.).  COAG Lab Results  Component Value Date   INR 1.2 10/10/2020   INR 3.1 (H) 09/18/2020   INR 1.0 09/16/2020    Radiology DG Foot Complete Right  Result Date: 12/18/2020 CLINICAL DATA:  Chronic right foot wound with maggot infestation. EXAM: RIGHT FOOT COMPLETE - 3+ VIEW COMPARISON:  None. FINDINGS: The bones of the right foot are diffusely osteopenic. There is diffuse soft tissue swelling of the right foot. There are soft tissue ulcers involving the heel, likely representing a decubitus ulcer of the heel, as well as medial to the first metatarsophalangeal joint, involving the lateral aspect of the great toe, the medial aspect of the second digit, and the distal aspect of the fourth and fifth digits. There are erosive changes involving the first metatarsal head medially in keeping with changes of osteomyelitis. There is probable exposure of the distal phalanx of the second, fourth, and fifth digits by overlying ulcers with erosive changes involving the second through fifth distal phalanges in keeping with osteomyelitis. No superimposed fracture. Advanced vascular calcifications are seen throughout the right foot. IMPRESSION: Multiple ulcerations of the right foot with erosive changes in  keeping with osteomyelitis involving the first metatarsal head and distal phalanges of the second through fifth digits. Decubitus heel ulcer. Peripheral vascular disease. Electronically Signed   By: Helyn Numbers M.D.   On: 12/18/2020 23:00     Assessment/Plan 1. Atherosclerosis of native artery of right lower extremity with rest pain (HCC) ***  2. Type 2 diabetes mellitus with diabetic neuropathy, without long-term current use of insulin (HCC) Continue hypoglycemic medications as already ordered, these medications have been reviewed and there are no changes at this time.  Hgb A1C to be monitored as already arranged by primary service   3. Mixed hyperlipidemia Continue statin as ordered and reviewed, no changes at this time   4. Bilateral pulmonary embolism (HCC) Patient is on Xarelto long-term.  Will be stopped 3 days prior to his surgery    Levora Dredge, MD  12/31/2020 8:32 AM

## 2021-01-01 ENCOUNTER — Ambulatory Visit (INDEPENDENT_AMBULATORY_CARE_PROVIDER_SITE_OTHER): Payer: Medicare Other | Admitting: Vascular Surgery

## 2021-01-01 DIAGNOSIS — I70221 Atherosclerosis of native arteries of extremities with rest pain, right leg: Secondary | ICD-10-CM

## 2021-01-01 DIAGNOSIS — E1140 Type 2 diabetes mellitus with diabetic neuropathy, unspecified: Secondary | ICD-10-CM

## 2021-01-01 DIAGNOSIS — I2699 Other pulmonary embolism without acute cor pulmonale: Secondary | ICD-10-CM

## 2021-01-01 DIAGNOSIS — E782 Mixed hyperlipidemia: Secondary | ICD-10-CM

## 2021-01-02 ENCOUNTER — Encounter (INDEPENDENT_AMBULATORY_CARE_PROVIDER_SITE_OTHER): Payer: Self-pay | Admitting: Vascular Surgery

## 2021-01-11 ENCOUNTER — Emergency Department: Payer: No Typology Code available for payment source

## 2021-01-11 ENCOUNTER — Inpatient Hospital Stay
Admission: EM | Admit: 2021-01-11 | Discharge: 2021-01-19 | DRG: 853 | Disposition: A | Discharge status: Skilled Nursing Facility | Payer: No Typology Code available for payment source | Attending: Internal Medicine | Admitting: Internal Medicine

## 2021-01-11 DIAGNOSIS — Z8249 Family history of ischemic heart disease and other diseases of the circulatory system: Secondary | ICD-10-CM

## 2021-01-11 DIAGNOSIS — E1152 Type 2 diabetes mellitus with diabetic peripheral angiopathy with gangrene: Secondary | ICD-10-CM | POA: Diagnosis present

## 2021-01-11 DIAGNOSIS — L97419 Non-pressure chronic ulcer of right heel and midfoot with unspecified severity: Secondary | ICD-10-CM | POA: Diagnosis present

## 2021-01-11 DIAGNOSIS — N400 Enlarged prostate without lower urinary tract symptoms: Secondary | ICD-10-CM | POA: Diagnosis present

## 2021-01-11 DIAGNOSIS — E1140 Type 2 diabetes mellitus with diabetic neuropathy, unspecified: Secondary | ICD-10-CM | POA: Diagnosis not present

## 2021-01-11 DIAGNOSIS — E43 Unspecified severe protein-calorie malnutrition: Secondary | ICD-10-CM | POA: Insufficient documentation

## 2021-01-11 DIAGNOSIS — D509 Iron deficiency anemia, unspecified: Secondary | ICD-10-CM | POA: Diagnosis present

## 2021-01-11 DIAGNOSIS — I952 Hypotension due to drugs: Secondary | ICD-10-CM | POA: Diagnosis present

## 2021-01-11 DIAGNOSIS — D638 Anemia in other chronic diseases classified elsewhere: Secondary | ICD-10-CM | POA: Diagnosis present

## 2021-01-11 DIAGNOSIS — M86171 Other acute osteomyelitis, right ankle and foot: Secondary | ICD-10-CM | POA: Diagnosis present

## 2021-01-11 DIAGNOSIS — F1010 Alcohol abuse, uncomplicated: Secondary | ICD-10-CM | POA: Diagnosis present

## 2021-01-11 DIAGNOSIS — I96 Gangrene, not elsewhere classified: Secondary | ICD-10-CM | POA: Diagnosis not present

## 2021-01-11 DIAGNOSIS — L97519 Non-pressure chronic ulcer of other part of right foot with unspecified severity: Secondary | ICD-10-CM | POA: Diagnosis present

## 2021-01-11 DIAGNOSIS — Z720 Tobacco use: Secondary | ICD-10-CM | POA: Diagnosis not present

## 2021-01-11 DIAGNOSIS — I70221 Atherosclerosis of native arteries of extremities with rest pain, right leg: Secondary | ICD-10-CM | POA: Diagnosis present

## 2021-01-11 DIAGNOSIS — M86671 Other chronic osteomyelitis, right ankle and foot: Secondary | ICD-10-CM | POA: Diagnosis present

## 2021-01-11 DIAGNOSIS — F17210 Nicotine dependence, cigarettes, uncomplicated: Secondary | ICD-10-CM | POA: Diagnosis present

## 2021-01-11 DIAGNOSIS — E1142 Type 2 diabetes mellitus with diabetic polyneuropathy: Secondary | ICD-10-CM | POA: Diagnosis present

## 2021-01-11 DIAGNOSIS — T446X5A Adverse effect of alpha-adrenoreceptor antagonists, initial encounter: Secondary | ICD-10-CM | POA: Diagnosis present

## 2021-01-11 DIAGNOSIS — I10 Essential (primary) hypertension: Secondary | ICD-10-CM | POA: Diagnosis present

## 2021-01-11 DIAGNOSIS — E785 Hyperlipidemia, unspecified: Secondary | ICD-10-CM | POA: Diagnosis present

## 2021-01-11 DIAGNOSIS — M869 Osteomyelitis, unspecified: Secondary | ICD-10-CM | POA: Diagnosis present

## 2021-01-11 DIAGNOSIS — A419 Sepsis, unspecified organism: Principal | ICD-10-CM

## 2021-01-11 DIAGNOSIS — Z7982 Long term (current) use of aspirin: Secondary | ICD-10-CM | POA: Diagnosis not present

## 2021-01-11 DIAGNOSIS — Z888 Allergy status to other drugs, medicaments and biological substances status: Secondary | ICD-10-CM

## 2021-01-11 DIAGNOSIS — Z20822 Contact with and (suspected) exposure to covid-19: Secondary | ICD-10-CM | POA: Diagnosis present

## 2021-01-11 DIAGNOSIS — Z7901 Long term (current) use of anticoagulants: Secondary | ICD-10-CM

## 2021-01-11 DIAGNOSIS — G8929 Other chronic pain: Secondary | ICD-10-CM | POA: Diagnosis present

## 2021-01-11 DIAGNOSIS — R000 Tachycardia, unspecified: Secondary | ICD-10-CM | POA: Diagnosis present

## 2021-01-11 DIAGNOSIS — Z6821 Body mass index (BMI) 21.0-21.9, adult: Secondary | ICD-10-CM

## 2021-01-11 DIAGNOSIS — E1169 Type 2 diabetes mellitus with other specified complication: Secondary | ICD-10-CM | POA: Diagnosis present

## 2021-01-11 DIAGNOSIS — E11621 Type 2 diabetes mellitus with foot ulcer: Secondary | ICD-10-CM | POA: Diagnosis present

## 2021-01-11 DIAGNOSIS — Z86711 Personal history of pulmonary embolism: Secondary | ICD-10-CM | POA: Diagnosis not present

## 2021-01-11 DIAGNOSIS — Z79899 Other long term (current) drug therapy: Secondary | ICD-10-CM

## 2021-01-11 DIAGNOSIS — Z9114 Patient's other noncompliance with medication regimen: Secondary | ICD-10-CM | POA: Diagnosis not present

## 2021-01-11 DIAGNOSIS — F17200 Nicotine dependence, unspecified, uncomplicated: Secondary | ICD-10-CM | POA: Diagnosis not present

## 2021-01-11 DIAGNOSIS — I70261 Atherosclerosis of native arteries of extremities with gangrene, right leg: Secondary | ICD-10-CM | POA: Diagnosis not present

## 2021-01-11 LAB — COMPREHENSIVE METABOLIC PANEL
ALT: 27 U/L (ref 0–44)
AST: 24 U/L (ref 15–41)
Albumin: 2.2 g/dL — ABNORMAL LOW (ref 3.5–5.0)
Alkaline Phosphatase: 87 U/L (ref 38–126)
Anion gap: 4 — ABNORMAL LOW (ref 5–15)
BUN: 13 mg/dL (ref 8–23)
CO2: 27 mmol/L (ref 22–32)
Calcium: 8.4 mg/dL — ABNORMAL LOW (ref 8.9–10.3)
Chloride: 107 mmol/L (ref 98–111)
Creatinine, Ser: 0.73 mg/dL (ref 0.61–1.24)
GFR, Estimated: >60 mL/min (ref >60)
Glucose, Bld: 99 mg/dL (ref 70–99)
Potassium: 4.6 mmol/L (ref 3.5–5.1)
Sodium: 138 mmol/L (ref 135–145)
Total Bilirubin: 0.5 mg/dL (ref 0.3–1.2)
Total Protein: 6.3 g/dL — ABNORMAL LOW (ref 6.5–8.1)

## 2021-01-11 LAB — CBC WITH DIFFERENTIAL/PLATELET
Abs Immature Granulocytes: 0.04 10*3/uL (ref 0.00–0.07)
Basophils Absolute: 0 10*3/uL (ref 0.0–0.1)
Basophils Relative: 0 %
Eosinophils Absolute: 0.4 10*3/uL (ref 0.0–0.5)
Eosinophils Relative: 3 %
HCT: 29.5 % — ABNORMAL LOW (ref 39.0–52.0)
Hemoglobin: 9.1 g/dL — ABNORMAL LOW (ref 13.0–17.0)
Immature Granulocytes: 0 %
Lymphocytes Relative: 16 %
Lymphs Abs: 1.7 10*3/uL (ref 0.7–4.0)
MCH: 22.8 pg — ABNORMAL LOW (ref 26.0–34.0)
MCHC: 30.8 g/dL (ref 30.0–36.0)
MCV: 73.8 fL — ABNORMAL LOW (ref 80.0–100.0)
Monocytes Absolute: 1.2 10*3/uL — ABNORMAL HIGH (ref 0.1–1.0)
Monocytes Relative: 10 %
Neutro Abs: 7.7 10*3/uL (ref 1.7–7.7)
Neutrophils Relative %: 71 %
Platelets: 476 10*3/uL — ABNORMAL HIGH (ref 150–400)
RBC: 4 MIL/uL — ABNORMAL LOW (ref 4.22–5.81)
RDW: 21.2 % — ABNORMAL HIGH (ref 11.5–15.5)
Smear Review: NORMAL
WBC: 11 10*3/uL — ABNORMAL HIGH (ref 4.0–10.5)
nRBC: 0 % (ref 0.0–0.2)

## 2021-01-11 LAB — LACTIC ACID, PLASMA
Lactic Acid, Venous: 1.6 mmol/L (ref 0.5–1.9)
Lactic Acid, Venous: 1.6 mmol/L (ref 0.5–1.9)

## 2021-01-11 LAB — PROTIME-INR
INR: 1.1 (ref 0.8–1.2)
Prothrombin Time: 14.4 seconds (ref 11.4–15.2)

## 2021-01-11 LAB — RESP PANEL BY RT-PCR (FLU A&B, COVID) ARPGX2
Influenza A by PCR: NEGATIVE
Influenza B by PCR: NEGATIVE
SARS Coronavirus 2 by RT PCR: NEGATIVE

## 2021-01-11 LAB — MAGNESIUM: Magnesium: 2.1 mg/dL (ref 1.7–2.4)

## 2021-01-11 LAB — APTT: aPTT: 29 seconds (ref 24–36)

## 2021-01-11 LAB — CBG MONITORING, ED
Glucose-Capillary: 78 mg/dL (ref 70–99)
Glucose-Capillary: 112 mg/dL — ABNORMAL HIGH (ref 70–99)

## 2021-01-11 MED ORDER — NICOTINE 21 MG/24HR TD PT24: 21.0000 mg | MEDICATED_PATCH | Freq: Once | TRANSDERMAL | Status: DC

## 2021-01-11 MED ORDER — ACETAMINOPHEN 650 MG RE SUPP: 650.0000 mg | Freq: Four times a day (QID) | RECTAL | Status: DC | PRN

## 2021-01-11 MED ORDER — SODIUM CHLORIDE 0.9 % IV SOLN
2.0000 g | Freq: Once | INTRAVENOUS | Status: AC
  Administered 2021-01-11: 2 g via INTRAVENOUS
  Filled 2021-01-11: qty 2

## 2021-01-11 MED ORDER — FENTANYL CITRATE PF 50 MCG/ML IJ SOSY: 25.0000 ug | PREFILLED_SYRINGE | INTRAMUSCULAR | Status: DC | PRN

## 2021-01-11 MED ORDER — VANCOMYCIN HCL 1250 MG/250ML IV SOLN
1250.0000 mg | Freq: Two times a day (BID) | INTRAVENOUS | Status: DC
  Administered 2021-01-12 – 2021-01-15 (×6): 1250 mg via INTRAVENOUS
  Filled 2021-01-11 (×8): qty 250

## 2021-01-11 MED ORDER — VANCOMYCIN HCL 750 MG/150ML IV SOLN
750.0000 mg | Freq: Once | INTRAVENOUS | Status: AC
  Administered 2021-01-11: 750 mg via INTRAVENOUS
  Filled 2021-01-11: qty 150

## 2021-01-11 MED ORDER — LACTATED RINGERS IV BOLUS (SEPSIS)
1000.0000 mL | Freq: Once | INTRAVENOUS | Status: AC
  Administered 2021-01-11: 1000 mL via INTRAVENOUS

## 2021-01-11 MED ORDER — SODIUM CHLORIDE 0.9 % IV SOLN
2.0000 g | Freq: Three times a day (TID) | INTRAVENOUS | Status: DC
  Administered 2021-01-12 – 2021-01-17 (×14): 2 g via INTRAVENOUS
  Filled 2021-01-11 (×18): qty 2

## 2021-01-11 MED ORDER — LACTATED RINGERS IV SOLN: INTRAVENOUS | Status: AC

## 2021-01-11 MED ORDER — ONDANSETRON HCL 4 MG/2ML IJ SOLN: 4.0000 mg | Freq: Four times a day (QID) | INTRAMUSCULAR | Status: DC | PRN

## 2021-01-11 MED ORDER — NALOXONE HCL 2 MG/2ML IJ SOSY
0.4000 mg | PREFILLED_SYRINGE | INTRAMUSCULAR | Status: DC | PRN
  Filled 2021-01-11: qty 2

## 2021-01-11 MED ORDER — ACETAMINOPHEN 325 MG PO TABS
650.0000 mg | ORAL_TABLET | Freq: Four times a day (QID) | ORAL | Status: DC | PRN
  Administered 2021-01-13: 650 mg via ORAL
  Filled 2021-01-11: qty 2

## 2021-01-11 MED ORDER — NICOTINE 21 MG/24HR TD PT24: 21.0000 mg | MEDICATED_PATCH | Freq: Every day | TRANSDERMAL | Status: DC | PRN

## 2021-01-11 MED ORDER — INSULIN ASPART 100 UNIT/ML IJ SOLN: 0.0000 [IU] | Freq: Three times a day (TID) | INTRAMUSCULAR | Status: DC

## 2021-01-11 MED ORDER — VANCOMYCIN HCL IN DEXTROSE 1-5 GM/200ML-% IV SOLN
1000.0000 mg | Freq: Once | INTRAVENOUS | Status: AC
  Administered 2021-01-11: 1000 mg via INTRAVENOUS
  Filled 2021-01-11: qty 200

## 2021-01-11 MED ORDER — LACTATED RINGERS IV BOLUS (SEPSIS)
500.0000 mL | Freq: Once | INTRAVENOUS | Status: AC
  Administered 2021-01-11: 500 mL via INTRAVENOUS

## 2021-01-11 NOTE — ED Notes (Signed)
Seen pt in room at this time , AAO, in NAD. Dressing dry/intact to Rt foot noted.  Lab personal t bdside for lab draw

## 2021-01-11 NOTE — Progress Notes (Signed)
Pharmacy Antibiotic Note  Brad Frost is a 65 y.o. male admitted on 01/11/2021 with sepsis and osteomyelitis of R foot.  Pharmacy has been consulted for vancomycin and cefepime dosing dosing.  Patient afebrile, WBC elevated at 11.0, LA 1.6  Plan: Vancomycin IV 1750mg  x1 loading dose Then vancomycin 1250mg  IV q12h (estimated AUC 514, goal AUC 400-550, using SCr 0.8) Cefepime 2g IV q8h Monitor clinical picture, renal function, vanc levels at Css F/U C&S, abx de-escalation, LOT   Height: 5\' 11"  (180.3 cm) Weight: 78 kg (172 lb) IBW/kg (Calculated) : 75.3  Temp (24hrs), Avg:98.4 F (36.9 C), Min:98.4 F (36.9 C), Max:98.4 F (36.9 C)  Recent Labs  Lab 01/11/21 1846 01/11/21 1938 01/11/21 1951  WBC  --   --  11.0*  CREATININE  --   --  0.73  LATICACIDVEN 1.6 1.6  --     Estimated Creatinine Clearance: 95.4 mL/min (by C-G formula based on SCr of 0.73 mg/dL).    Allergies  Allergen Reactions   Sildenafil     Antimicrobials this admission: Cefepime 9/18> Vancomycin 9/18>   Microbiology results: 9/18 RVP sent 9/18 BCx sent   Gerrick Ray 10/18, PharmD Clinical Pharmacist  01/11/2021   9:50 PM

## 2021-01-11 NOTE — ED Triage Notes (Signed)
Pt BIBA from [facility]. Pt states he returned today after he lost a toe on his right foot. Pt states dressing has not been changed in 3 days.

## 2021-01-11 NOTE — ED Provider Notes (Signed)
[facility] Emergency Department Provider Note ____________________________________________   Event Date/Time   First MD Initiated Contact with Patient 01/11/21 1825     (approximate)  I have reviewed the triage vital signs and the nursing notes.  HISTORY  Chief Complaint Foot Problem   HPI Brad Frost is a 65 y.o. malewho presents to the ED for evaluation of foot gangrene.  Chart review indicates patient was here about 3 weeks ago and left AMA.  Patient returns to the ED today via EMS from his local SNF for evaluation of worsening right foot gangrenous wound.  He reports having a dressing change recently and one of his toes fell off, so he realized it is probably time to get hospitalized and talk about amputation.  He expresses quite a bit of concern that he is unable to walk outside to smoke a cigarette.  Considered leaving AMA again regarding this.  He denies any systemic symptoms, fever, generalized weakness, nausea or pain.  He reports that he feels fine right now.  During my initial evaluation, I clearly explained to him that he will need hospitalization, antibiotics and amputation.  He is in agreement with plan of care.  Past Medical History:  Diagnosis Date   Diabetes mellitus without complication (HCC)    Hypertension    Peripheral vascular disease [facility])     Patient Active Problem List   Diagnosis Date Noted   Atherosclerosis of native arteries of extremity with rest pain (HCC) 12/31/2020   Hyperlipidemia 12/31/2020   Gangrene (HCC) 10/09/2020   Anemia of chronic disease 10/09/2020   Toe ulcer, right (HCC) 09/17/2020   Cellulitis of lower extremity 09/16/2020   COVID-19 04/13/2019   Hypotension    COVID-19 virus infection 04/04/2019   Pneumonia due to COVID-19 virus    Bilateral pulmonary embolism (HCC)    Lung mass    AKI (acute kidney injury) (HCC)    Type 2 diabetes mellitus with diabetic neuropathy, without long-term  current use of insulin (HCC)    PVD (peripheral vascular disease) (HCC) 02/14/2015    Past Surgical History:  Procedure Laterality Date   LEG SURGERY  August 20, 2014   Right Leg  BPG   LOWER EXTREMITY ANGIOGRAPHY Left 09/19/2020   Procedure: Lower Extremity Angiography;  Surgeon: Renford Dills, MD;  Location: [facility] INVASIVE CV LAB;  Service: Cardiovascular;  Laterality: Left;   LOWER EXTREMITY ANGIOGRAPHY Right 10/10/2020   Procedure: Lower Extremity Angiography;  Surgeon: Annice Needy, MD;  Location: [facility] INVASIVE CV LAB;  Service: Cardiovascular;  Laterality: Right;    Prior to Admission medications   Medication Sig Start Date End Date Taking? Authorizing Provider  acetaminophen (TYLENOL) 325 MG tablet Take 2 tablets (650 mg total) by mouth every 4 (four) hours as needed for headache or mild pain. 09/23/20   Lurene Shadow, MD  aspirin 81 MG tablet Take 81 mg by mouth daily. For prophylaxis    [provider]  atorvastatin (LIPITOR) 20 MG tablet Take 4 tablets (80 mg total) by mouth daily. 10/14/20   Tresa Moore, MD  clotrimazole (LOTRIMIN) 1 % cream Apply 1 application topically in the morning and at bedtime. 12/09/20   [provider]  metoprolol succinate (TOPROL-XL) 25 MG 24 hr tablet Take 1 tablet (25 mg total) by mouth daily. 04/09/19   Arnetha Courser, MD  rivaroxaban (XARELTO) 20 MG TABS tablet Take 1 tablet (20 mg total) by mouth at bedtime. 10/14/20   Sreenath, Jonelle Sports,  MD  tamsulosin (FLOMAX) 0.4 MG CAPS capsule Take 0.4 mg by mouth daily. For urinary    [provider]    Allergies Sildenafil  Family History  Problem Relation Age of Onset   Hypertension Mother     Social History Social History   Tobacco Use   Smoking status: Former    Types: Cigarettes   Smokeless tobacco: Never  Substance Use Topics   Alcohol use: Yes   Drug use: No    Review of Systems  Constitutional: No fever/chills Eyes: No visual changes. ENT: No  sore throat. Cardiovascular: Denies chest pain. Respiratory: Denies shortness of breath. Gastrointestinal: No abdominal pain.  No nausea, no vomiting.  No diarrhea.  No constipation. Genitourinary: Negative for dysuria. Musculoskeletal: Negative for back pain. Skin: Negative for rash. Neurological: Negative for headaches, focal weakness or numbness.  ____________________________________________   PHYSICAL EXAM:  VITAL SIGNS: Vitals:   01/11/21 1828  BP: 120/76  Pulse: (!) 102  Resp: 12  Temp: 98.4 F (36.9 C)  SpO2: 99%     Constitutional: Alert and oriented. Well appearing and in no acute distress. Eyes: Conjunctivae are normal. PERRL. EOMI. Head: Atraumatic. Nose: No congestion/rhinnorhea. Mouth/Throat: Mucous membranes are moist.  Oropharynx non-erythematous. Neck: No stridor. No cervical spine tenderness to palpation. Cardiovascular: Tachycardic rate, regular rhythm. Grossly normal heart sounds.  Good peripheral circulation. Respiratory: Normal respiratory effort.  No retractions. Lungs CTAB. Gastrointestinal: Soft , nondistended, nontender to palpation. No CVA tenderness. Musculoskeletal:  Obviously gangrenous right foot, covered with a fairly clean bandage and ABDs. Chronic venous stasis dermatitis.  Some mild tenderness to right calf palpation, but no tense calf, crepitus Neurologic:  Normal speech and language. No gross focal neurologic deficits are appreciated.  Skin:  Skin is warm, dry and intact. No rash noted. Psychiatric: Mood and affect are normal. Speech and behavior are normal.      ____________________________________________   LABS (all labs ordered are listed, but only abnormal results are displayed)  Labs Reviewed  COMPREHENSIVE METABOLIC PANEL - Abnormal; Notable for the following components:      Result Value   Calcium 8.4 (*)    Total Protein 6.3 (*)    Albumin 2.2 (*)    Anion gap 4 (*)    All other components within normal limits  CBC  WITH DIFFERENTIAL/PLATELET - Abnormal; Notable for the following components:   WBC 11.0 (*)    RBC 4.00 (*)    Hemoglobin 9.1 (*)    HCT 29.5 (*)    MCV 73.8 (*)    MCH 22.8 (*)    RDW 21.2 (*)    Platelets 476 (*)    Monocytes Absolute 1.2 (*)    All other components within normal limits  CULTURE, BLOOD (SINGLE)  URINE CULTURE  RESP PANEL BY RT-PCR (FLU A&B, COVID) ARPGX2  LACTIC ACID, PLASMA  LACTIC ACID, PLASMA  PROTIME-INR  APTT  URINALYSIS, COMPLETE (UACMP) WITH MICROSCOPIC  CBG MONITORING, ED   ____________________________________________  12 Lead EKG   ____________________________________________  RADIOLOGY  ED MD interpretation: Plain film of the right foot reviewed by me with osteomyelitis Plain film of the tib-fib without evidence of free air/gas  Official radiology report(s): DG Tibia/Fibula Right  Result Date: 01/11/2021 CLINICAL DATA:  Gangrenous foot. EXAM: RIGHT TIBIA AND FIBULA - 2 VIEW COMPARISON:  None. FINDINGS: Mild degenerative changes demonstrated in the right knee with moderate degenerative changes in the right ankle. No evidence of acute fracture or dislocation of the tibia or  fibula. No cortical erosion or bone destruction. No focal bone lesions. No significant effusion in the knee. Surgical clips in the soft tissues. Prominent vascular calcifications. No radiopaque soft tissue foreign body or soft tissue gas identified. IMPRESSION: No acute bony abnormalities. Degenerative changes in the knee and ankle joints. Prominent vascular calcifications. Electronically Signed   By: Burman Nieves M.D.   On: 01/11/2021 19:28   DG Chest Port 1 View  Result Date: 01/11/2021 CLINICAL DATA:  Sepsis EXAM: PORTABLE CHEST 1 VIEW COMPARISON:  10/09/2020 FINDINGS: The heart size and mediastinal contours are within normal limits. Elevation of the left hemidiaphragm with associated atelectasis in the left lung base. No pleural effusion or pneumothorax. No focal pulmonary  opacity. No acute osseous abnormality. IMPRESSION: No active disease. Electronically Signed   By: Wiliam Ke M.D.   On: 01/11/2021 19:23   DG Foot Complete Right  Result Date: 01/11/2021 CLINICAL DATA:  Gangrenous foot. EXAM: RIGHT FOOT COMPLETE - 3+ VIEW COMPARISON:  12/18/2020 FINDINGS: Diffuse bone demineralization. Severe soft tissue ulcerations involving the toes and with a large ulceration over the posterior calcaneus. No radiopaque soft tissue foreign bodies or soft tissue gas collections. Mild soft tissue swelling over the dorsum of the foot. 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 There is progression of these changes since the previous study. Changes are consistent with multiple sites of osteomyelitis. Degenerative changes and sclerosis in the hindfoot and intertarsal joints suggest developing Charcot joint. Prominent vascular calcifications. IMPRESSION: Progressive changes of soft tissue ulceration and osteomyelitis at multiple sites as discussed above. Electronically Signed   By: Burman Nieves M.D.   On: 01/11/2021 19:27    ____________________________________________   PROCEDURES and INTERVENTIONS  Procedure(s) performed (including Critical Care):  .1-3 Lead EKG Interpretation Performed by: Delton Prairie, MD Authorized by: Delton Prairie, MD     Interpretation: abnormal     ECG rate:  106   ECG rate assessment: tachycardic     Rhythm: sinus tachycardia     Ectopy: none     Conduction: normal   .Critical Care Performed by: Delton Prairie, MD Authorized by: Delton Prairie, MD   Critical care provider statement:    Critical care time (minutes):  45   Critical care was necessary to treat or prevent imminent or life-threatening deterioration of the following conditions:  Sepsis   Critical care  was time spent personally by me on the following activities:  Discussions with consultants, evaluation of patient's response to treatment, examination of patient, ordering and performing treatments and interventions, ordering and review of laboratory studies, ordering and review of radiographic studies, pulse oximetry, re-evaluation of patient's condition, obtaining history from patient or surrogate and review of old charts  Medications  nicotine (NICODERM CQ - dosed in mg/24 hours) patch 21 mg (21 mg Transdermal Patient Refused/Not Given 01/11/21 1844)  lactated ringers infusion (has no administration in time range)  ceFEPIme (MAXIPIME) 2 g in sodium chloride 0.9 % 100 mL IVPB (has no administration in time range)  vancomycin (VANCOCIN) IVPB 1000 mg/200 mL premix (has no administration in time range)  lactated ringers bolus 1,000 mL (has no administration in time range)    And  lactated ringers bolus 1,000 mL (has no administration in time range)    And  lactated ringers bolus 500 mL (has no administration  in time range)    ____________________________________________   MDM / ED COURSE   65 year old male with chronic gangrenous foot presents to the ED with evidence of sepsis from his gangrenous foot and osteomyelitis, requiring admission for amputation.  Stable, but tachycardic.  Blood work with mild leukocytosis and microcytic anemia without indication for transfusion.  Metabolic panel is reassuring.  No evidence of severe sepsis.  Plain films confirm progressive osteomyelitis.  We will initiate antibiotics and admit to medicine with anticipation of vascular consultation for amputation.     ____________________________________________   FINAL CLINICAL IMPRESSION(S) / ED DIAGNOSES  Final diagnoses:  Sepsis without acute organ dysfunction, due to unspecified organism [facility])     ED Discharge Orders     None        Omarian Jaquith   Note:  This document was prepared using Dragon  voice recognition software and may include unintentional dictation errors.    Delton Prairie, MD 01/11/21 2108

## 2021-01-11 NOTE — Progress Notes (Signed)
CODE SEPSIS - PHARMACY COMMUNICATION  **Broad Spectrum Antibiotics should be administered within 1 hour of Sepsis diagnosis**  Time Code Sepsis Called/Page Received: 2037  Antibiotics Ordered: vanc and cefepime @ 2036  Time of 1st antibiotic administration: 2131  Additional action taken by pharmacy: ordered additional vancomycin to complete loading dose  If necessary, Name of Provider/Nurse Contacted: n/a    Doroteo Glassman ,PharmD Clinical Pharmacist  01/11/2021  9:36 PM

## 2021-01-11 NOTE — ED Notes (Signed)
Per ED tech, another lab tech will be back to draw labs for this pt

## 2021-01-11 NOTE — Sepsis Progress Note (Signed)
Following for sepsis monitoring ?

## 2021-01-11 NOTE — H&P (Signed)
History and Physical    PLEASE NOTE THAT DRAGON DICTATION SOFTWARE WAS USED IN THE CONSTRUCTION OF THIS NOTE.   Brad Frost ZOX:[id number] DOB: 1956-03-19 DOA: 01/11/2021  PCP: [name], [place], MD Patient coming from: home   I have personally briefly reviewed patient's old medical records in [place]  Chief Complaint: Worsening of right foot ulcers  HPI: Brad Frost is a 65 y.o. male with medical history significant for chronic diabetic foot ulcers, peripheral vascular disease, type 2 diabetes mellitus, anemia of chronic disease associated with baseline hemoglobin 7.5-9, who is admitted to [facility] on 01/11/2021 with progressive osteomyelitis of a gangrenous right foot after presenting from home to [facility] ED complaining of worsening right foot ulcers.   The patient has a history of chronic diabetic right foot ulcers associated with osteomyelitis.  He has been undergoing outpatient wound care at the wound care clinic including a recent appointment they are on 12/26/2020, prompting referral to Dr. Delana Meyer of vascular surgery in the setting of worsening of right foot ulcers associated with gangrenous right foot.  Consequently, the patient was scheduled for an appointment in vascular surgery clinic with Dr. Delana Meyer on 01/01/21, although the patient reports that he was ultimately unable to attend that appointment.   He presents to [facility] ED this evening complaining of interval progression in the size and distribution of his right foot ulcers, namely the ulcer associated with the medial aspect of the right midfoot as well as an ulcer associated with the heel of the right foot.  He denies any associated drainage, increased swelling or worsening erythema.  He also denies any associated worsening of pain associated with the right foot, but conveys that this is within the context of diabetic peripheral polyneuropathy.  He also notes that a portion of his right second toe fell  off during a routine outpatient dressing changes involving the right foot, which ultimately prompted the patient to present to the emergency department for further evaluation.  He denies any associated subjective fever, chills, rigors, or generalized myalgias.  He confirms a history of acute bilateral pulmonary emboli in December 2020 for which he reports he completed a course of Xarelto, and conveys that he is no longer on anticoagulation measures and specifically denies taking any blood thinners as an outpatient, including no aspirin.  Denies any recent shortness of breath, orthopnea, or PND.  He also denies any recent chest pain, diaphoresis, palpitations, dizziness, presyncope, or syncope.  No recent headache, neck stiffness, sore throat, nausea, vomiting, abdominal pain, or diarrhea.  Denies any recent dysuria, gross hematuria, or change in urinary urgency/frequency.  Of note, the patient underwent left lower extremity angiography in May 2022 followed by right lower extremity angiography in June 2022.   He presented to [facility] ED approximately 3 weeks ago for evaluation of worsening of his diabetic right foot ulcers.  ED physician at that time recommended hospitalization for further evaluation management of osteomyelitis of a gangrenous right foot.  However, the patient left the ED AMA prior to being admitted.     ED Course:  Vital signs in the ED were notable for the following: Tetramex 98.4; heart rate 102; blood pressure 92/58 -120/76, with most recent blood pressures in the 110/72; respiratory rate 12-15, oxygen saturation 99 to 100% on room air.  Labs were notable for the following: CMP notable for carbonate 27, creatinine 0.73, glucose 99, calcium corrected for hypoalbuminemia 9.8, albumin 2.2, otherwise, liver enzymes found to be within normal  limits.  Urinalysis been ordered, with result currently pending.  CBC notable for blood cell count 11,000, hemoglobin 9.1 relative to most recent prior  value of 7.8 on 10/10/2020, platelets 476.  Lactic acid x2 were both found to be 1.6.  INR 1.1.  Screening COVID-19/influenza PCR were checked in the ED this evening and found to be negative.  Blood cultures checked prior to initiation of IV antibiotics in the ED today.  Imaging and additional notable ED work-up: Plain films of the right foot, in comparison to plain films performed on 12/18/2020 showed interval progression of soft tissue ulceration and osteomyelitis at multiple sites, without evidence of soft tissue gas..  Plain films of the right tib-fib showed no evidence of acute fracture or dislocation of the tib-fib/fib, including no evidence of cortical erosion or bone destruction, as well as no evidence of soft tissue gas.  Chest x-ray showed no evidence of acute cardiopulmonary process.  While in the ED, the following were administered: Cefepime, IV vancomycin, 2.5 L lactated Ringer bolus.  Subsequently, the patient was admitted for further evaluation and treatment of acute on chronic osteomyelitis of the right foot complicated by gangrene.      Review of Systems: As per HPI otherwise 10 point review of systems negative.   Past Medical History:  Diagnosis Date   Diabetes mellitus without complication ([place])    Hypertension    Peripheral vascular disease [facility]     Past Surgical History:  Procedure Laterality Date   LEG SURGERY  August 20, 2014   Right Leg  BPG   LOWER EXTREMITY ANGIOGRAPHY Left 09/19/2020   Procedure: Lower Extremity Angiography;  Surgeon: Katha Cabal, MD;  Location: [place] [facility];  Service: Cardiovascular;  Laterality: Left;   LOWER EXTREMITY ANGIOGRAPHY Right 10/10/2020   Procedure: Lower Extremity Angiography;  Surgeon: Algernon Huxley, MD;  Location: [place] [facility];  Service: Cardiovascular;  Laterality: Right;    Social History:  reports that he has quit smoking. His smoking use included cigarettes. He has never used smokeless tobacco. He reports  current alcohol use. He reports that he does not use drugs.   Allergies  Allergen Reactions   Sildenafil     Family History  Problem Relation Age of Onset   Hypertension Mother     Family history reviewed and not pertinent    Prior to Admission medications   Medication Sig Start Date End Date Taking? Authorizing Provider  acetaminophen (TYLENOL) 325 MG tablet Take 2 tablets (650 mg total) by mouth every 4 (four) hours as needed for headache or mild pain. 09/23/20   Jennye Boroughs, MD  aspirin 81 MG tablet Take 81 mg by mouth daily. For prophylaxis    [provider]  atorvastatin (LIPITOR) 20 MG tablet Take 4 tablets (80 mg total) by mouth daily. 10/14/20   Sidney Ace, MD  clotrimazole (LOTRIMIN) 1 % cream Apply 1 application topically in the morning and at bedtime. 12/09/20   [provider]  metoprolol succinate (TOPROL-XL) 25 MG 24 hr tablet Take 1 tablet (25 mg total) by mouth daily. 04/09/19   Lorella Nimrod, MD  rivaroxaban (XARELTO) 20 MG TABS tablet Take 1 tablet (20 mg total) by mouth at bedtime. 10/14/20   Sidney Ace, MD  tamsulosin (FLOMAX) 0.4 MG CAPS capsule Take 0.4 mg by mouth daily. For urinary    [provider]     Objective    Physical Exam: Vitals:   01/11/21 1826 01/11/21  1828  BP:  120/76  Pulse:  (!) 102  Resp:  12  Temp:  98.4 F (36.9 C)  TempSrc:  Oral  SpO2:  99%  Weight: 78 kg   Height: _0  (1.803 m)     General: appears to be stated age; alert, oriented Skin: warm, dry,  Head:  AT/[place] Mouth:  Oral mucosa membranes appear dry, normal dentition Neck: supple; trachea midline Heart:  RRR; did not appreciate any M/R/G Lungs: CTAB, did not appreciate any wheezes, rales, or rhonchi Abdomen: + BS; soft, ND, NT Extremities: no peripheral edema, no muscle wasting; right foot ulcers associated with the medial aspect of the right midfoot as well as the right heel, without evidence of associated drainage.   Neuro: strength and sensation intact in upper and lower extremities b/l     Labs on Admission: I have personally reviewed following labs and imaging studies  CBC: Recent Labs  Lab 01/11/21 1951  WBC 11.0*  NEUTROABS 7.7  HGB 9.1*  HCT 29.5*  MCV 73.8*  PLT 076*   Basic Metabolic Panel: Recent Labs  Lab 01/11/21 1951  NA 138  K 4.6  CL 107  CO2 27  GLUCOSE 99  BUN 13  CREATININE 0.73  CALCIUM 8.4*   GFR: Estimated Creatinine Clearance: 95.4 mL/min (by C-G formula based on SCr of 0.73 mg/dL). Liver Function Tests: Recent Labs  Lab 01/11/21 1951  AST 24  ALT 27  ALKPHOS 87  BILITOT 0.5  PROT 6.3*  ALBUMIN 2.2*   No results for input(s): LIPASE, AMYLASE in the last 168 hours. No results for input(s): AMMONIA in the last 168 hours. Coagulation Profile: Recent Labs  Lab 01/11/21 1951  INR 1.1   Cardiac Enzymes: No results for input(s): CKTOTAL, CKMB, CKMBINDEX, TROPONINI in the last 168 hours. BNP (last 3 results) No results for input(s): PROBNP in the last 8760 hours. HbA1C: No results for input(s): HGBA1C in the last 72 hours. CBG: Recent Labs  Lab 01/11/21 2014  GLUCAP 78   Lipid Profile: No results for input(s): CHOL, HDL, LDLCALC, TRIG, CHOLHDL, LDLDIRECT in the last 72 hours. Thyroid Function Tests: No results for input(s): TSH, T4TOTAL, FREET4, T3FREE, THYROIDAB in the last 72 hours. Anemia Panel: No results for input(s): VITAMINB12, FOLATE, FERRITIN, TIBC, IRON, RETICCTPCT in the last 72 hours. Urine analysis: No results found for: COLORURINE, APPEARANCEUR, [place], [place], GLUCOSEU, HGBUR, BILIRUBINUR, KETONESUR, PROTEINUR, UROBILINOGEN, NITRITE, LEUKOCYTESUR  Radiological Exams on Admission: DG Tibia/Fibula Right  Result Date: 01/11/2021 CLINICAL DATA:  Gangrenous foot. EXAM: RIGHT TIBIA AND FIBULA - 2 VIEW COMPARISON:  None. FINDINGS: Mild degenerative changes demonstrated in the right knee with moderate degenerative changes in the  right ankle. No evidence of acute fracture or dislocation of the tibia or fibula. No cortical erosion or bone destruction. No focal bone lesions. No significant effusion in the knee. Surgical clips in the soft tissues. Prominent vascular calcifications. No radiopaque soft tissue foreign body or soft tissue gas identified. IMPRESSION: No acute bony abnormalities. Degenerative changes in the knee and ankle joints. Prominent vascular calcifications. Electronically Signed   By: Lucienne Capers M.D.   On: 01/11/2021 19:28   DG Chest Port 1 View  Result Date: 01/11/2021 CLINICAL DATA:  Sepsis EXAM: PORTABLE CHEST 1 VIEW COMPARISON:  10/09/2020 FINDINGS: The heart size and mediastinal contours are within normal limits. Elevation of the left hemidiaphragm with associated atelectasis in the left lung base. No pleural effusion or pneumothorax. No focal pulmonary opacity. No acute osseous  abnormality. IMPRESSION: No active disease. Electronically Signed   By: Merilyn Baba M.D.   On: 01/11/2021 19:23   DG Foot Complete Right  Result Date: 01/11/2021 CLINICAL DATA:  Gangrenous foot. EXAM: RIGHT FOOT COMPLETE - 3+ VIEW COMPARISON:  12/18/2020 FINDINGS: Diffuse bone demineralization. Severe soft tissue ulcerations involving the toes and with a large ulceration over the posterior calcaneus. No radiopaque soft tissue foreign bodies or soft tissue gas collections. Mild soft tissue swelling over the dorsum of the foot. 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 There is progression of these changes since the previous study. Changes are consistent with multiple sites of osteomyelitis. Degenerative changes and sclerosis in the hindfoot and intertarsal joints suggest developing Charcot joint. Prominent vascular calcifications. IMPRESSION:  Progressive changes of soft tissue ulceration and osteomyelitis at multiple sites as discussed above. Electronically Signed   By: Lucienne Capers M.D.   On: 01/11/2021 19:27      Assessment/Plan   Brad Frost is a 65 y.o. male with medical history significant for chronic diabetic foot ulcers, peripheral vascular disease, type 2 diabetes mellitus, anemia of chronic disease associated with baseline hemoglobin 7.5-9, who is admitted to [facility] on 01/11/2021 with progressive osteomyelitis of a gangrenous right foot after presenting from home to [facility] ED complaining of worsening right foot ulcers.    Principal Problem:   Osteomyelitis of right foot (HCC) Active Problems:   Type 2 diabetes mellitus with diabetic neuropathy, without long-term current use of insulin (HCC)   Gangrene of right foot ([place])   Anemia of chronic disease   Sepsis ([place])   Tobacco abuse    #) sepsis due to Acute on chronic osteomyelitis of gangrenous right foot: In the setting of a history of chronic diabetic right foot ulcer with previously noted associated osteomyelitis and gangrene for which the patient previously refused hospitalization 3 weeks ago and did not attend his outpatient appointment in vascular surgery clinic, he presents to the ED this evening with interval worsening of the multiple ulcerative lesions on his gangrenous right foot, with plain films of the right foot, relative to 12/18/2020, showing interval progression of soft tissue ulceration and interval progression of osteomyelitis at multiple sites, without evidence of soft tissue gas to suggest necrotizing fasciitis.  This is in the context of multiple predisposing comorbidities, including diabetic peripheral polyneuropathy as well as peripheral vascular disease, with bilateral lower extremity angiography performed in May and June 2022, as further detailed above.  Of note, the patient verbalizes his willingness for hospitalization  at this time for further evaluation and management of his acute on chronic osteomyelitis of his gangrenous right foot.  Criteria for sepsis met via SIRS criteria, including presence of leukocytosis and tachycardia.  Of note, patient sepsis does not meet criteria to be considered severe in nature in the absence of any evidence of associated endorgan damage, including nonelevated lactic acid levels x 2 draws.  Additionally, patient had a documented blood pressure of 92/58, although it is improved in 107/r 72 following interval 2.5 L IVF bolus, while maintaining maps greater than 65 mmHg throughout.  Given these blood pressures and ensuing improvement via IVF's, criteria are not met for septic shock at this time.  Blood cultures x2 collected in the ED this evening prior to initiation of cefepime and IV  vancomycin.  No additional source of underlying infection identified at this time, including COVID-19 PCR, which was performed in the ED this evening, found to be negative.  Urinalysis has been ordered, with result currently pending.  Additionally, chest x-ray showed no evidence of acute cardiopulmonary process, as further detailed above.    Plan: Continuous lactated Ringer's overnight.  Follow for results of blood cultures x2.  Continue Rocephin and IV vancomycin.  Repeat CBC differential in the morning.  Check ESR and CRP.  Check INR. Monitor on tele. Will consult vascular surgery.  Refraining from pharmacologic DVT prophylaxis.  Follow for result urinalysis.       #) Benign prostatic hyperplasia: The patient acknowledges a history of such.  He conveys that he was previously treated with tamsulosin, but no longer is on this medication as an outpatient.  Plan: Monitor strict I's and O's and daily weights.  Repeat BMP in the morning.      #) Type 2 diabetes mellitus: Complicated by diabetic peripheral polyneuropathy and serving as a contributory factor leading to his chronic diabetic right foot ulcer  and ensuing osteomyelitis, as further detailed above.  He reports that this is managed via lifestyle modifications, with most recent hemoglobin A1c noted to be 5.7%.  Specifically, he confirms that he is on no exogenous insulin as an outpatient or any oral hypoglycemic agents.  Presenting blood sugar noted to be 99.  Plan: Accu-Cheks before every meal and at bedtime with low-dose sliding scale insulin.       #) Essential hypertension: The patient confirms a history of such, noting that he is no longer on antihypertensive therapy as an outpatient given ensuing improvement in his blood pressure via lifestyle modifications.  Systolic blood pressures noted to be in the low 100s in the ED.   Plan: Close monitoring of ensuing blood pressure via routine vital signs, particularly in the setting of presenting sepsis, as further detailed above.      #) Chronic tobacco abuse: The patient acknowledges that he is a current smoker.  Plan: Counseled the patient on the importance of complete smoking discontinuation, particular in the setting of his history of peripheral vascular disease with associated contribution to his chronic diabetic foot ulcer, as above.  As needed nicotine patch has been ordered for use during this hospitalization.      #) Anemia of chronic disease: Documented history of such, with associated baseline hemoglobin 7.5-9, with presenting hemoglobin found to be consistent with this range in the absence of any evidence of acute blood loss at this time.  INR 1.1.  Plan: Repeat CBC in the morning.      DVT prophylaxis: scd on LLE  Code Status: Full code Family Communication: none Disposition Plan: Per Rounding Team Consults called: will consult vascular surgery;  Admission status: Inpatient; med telemetry     Of note, this patient was added by me to the following Admit List/Treatment Team: armcadmits.    Of note, the Adult Admission Order Set (Multimorbid order set) was  used by me in the admission process for this patient.   PLEASE NOTE THAT DRAGON DICTATION SOFTWARE WAS USED IN THE CONSTRUCTION OF THIS NOTE.   [facility] Pager [phone] From [place]  Otherwise, please contact night-coverage  [email or web address] Password [email or web address]   01/11/2021, 9:11 PM

## 2021-01-12 DIAGNOSIS — F17210 Nicotine dependence, cigarettes, uncomplicated: Secondary | ICD-10-CM

## 2021-01-12 DIAGNOSIS — E785 Hyperlipidemia, unspecified: Secondary | ICD-10-CM | POA: Diagnosis not present

## 2021-01-12 DIAGNOSIS — E1152 Type 2 diabetes mellitus with diabetic peripheral angiopathy with gangrene: Secondary | ICD-10-CM

## 2021-01-12 DIAGNOSIS — A419 Sepsis, unspecified organism: Secondary | ICD-10-CM | POA: Diagnosis present

## 2021-01-12 DIAGNOSIS — I70261 Atherosclerosis of native arteries of extremities with gangrene, right leg: Secondary | ICD-10-CM

## 2021-01-12 DIAGNOSIS — Z720 Tobacco use: Secondary | ICD-10-CM | POA: Diagnosis present

## 2021-01-12 LAB — CBC WITH DIFFERENTIAL/PLATELET
Abs Immature Granulocytes: 0.03 10*3/uL (ref 0.00–0.07)
Basophils Absolute: 0 10*3/uL (ref 0.0–0.1)
Basophils Relative: 0 %
Eosinophils Absolute: 0.2 10*3/uL (ref 0.0–0.5)
Eosinophils Relative: 2 %
HCT: 25.8 % — ABNORMAL LOW (ref 39.0–52.0)
Hemoglobin: 8 g/dL — ABNORMAL LOW (ref 13.0–17.0)
Immature Granulocytes: 0 %
Lymphocytes Relative: 15 %
Lymphs Abs: 1.6 10*3/uL (ref 0.7–4.0)
MCH: 22 pg — ABNORMAL LOW (ref 26.0–34.0)
MCHC: 31 g/dL (ref 30.0–36.0)
MCV: 71.1 fL — ABNORMAL LOW (ref 80.0–100.0)
Monocytes Absolute: 1.2 10*3/uL — ABNORMAL HIGH (ref 0.1–1.0)
Monocytes Relative: 12 %
Neutro Abs: 7.4 10*3/uL (ref 1.7–7.7)
Neutrophils Relative %: 71 %
Platelets: 422 10*3/uL — ABNORMAL HIGH (ref 150–400)
RBC: 3.63 MIL/uL — ABNORMAL LOW (ref 4.22–5.81)
RDW: 21.2 % — ABNORMAL HIGH (ref 11.5–15.5)
Smear Review: NORMAL
WBC: 10.4 10*3/uL (ref 4.0–10.5)
nRBC: 0 % (ref 0.0–0.2)

## 2021-01-12 LAB — URINALYSIS, COMPLETE (UACMP) WITH MICROSCOPIC
Bacteria, UA: NONE SEEN
Bilirubin Urine: NEGATIVE
Glucose, UA: NEGATIVE mg/dL
Hgb urine dipstick: NEGATIVE
Ketones, ur: NEGATIVE mg/dL
Leukocytes,Ua: NEGATIVE
Nitrite: NEGATIVE
Protein, ur: NEGATIVE mg/dL
Specific Gravity, Urine: 1.02 (ref 1.005–1.030)
pH: 5.5 (ref 5.0–8.0)

## 2021-01-12 LAB — COMPREHENSIVE METABOLIC PANEL
ALT: 20 U/L (ref 0–44)
AST: 19 U/L (ref 15–41)
Albumin: 1.8 g/dL — ABNORMAL LOW (ref 3.5–5.0)
Alkaline Phosphatase: 68 U/L (ref 38–126)
Anion gap: 8 (ref 5–15)
BUN: 9 mg/dL (ref 8–23)
CO2: 23 mmol/L (ref 22–32)
Calcium: 7.9 mg/dL — ABNORMAL LOW (ref 8.9–10.3)
Chloride: 106 mmol/L (ref 98–111)
Creatinine, Ser: 0.62 mg/dL (ref 0.61–1.24)
GFR, Estimated: >60 mL/min (ref >60)
Glucose, Bld: 89 mg/dL (ref 70–99)
Potassium: 3.8 mmol/L (ref 3.5–5.1)
Sodium: 137 mmol/L (ref 135–145)
Total Bilirubin: 0.7 mg/dL (ref 0.3–1.2)
Total Protein: 5.2 g/dL — ABNORMAL LOW (ref 6.5–8.1)

## 2021-01-12 LAB — PROTIME-INR
INR: 1.1 (ref 0.8–1.2)
Prothrombin Time: 14.3 seconds (ref 11.4–15.2)

## 2021-01-12 LAB — MAGNESIUM: Magnesium: 1.7 mg/dL (ref 1.7–2.4)

## 2021-01-12 LAB — CBG MONITORING, ED: Glucose-Capillary: 93 mg/dL (ref 70–99)

## 2021-01-12 LAB — SEDIMENTATION RATE: Sed Rate: 64 mm/hr — ABNORMAL HIGH (ref 0–20)

## 2021-01-12 LAB — C-REACTIVE PROTEIN: CRP: 6.7 mg/dL — ABNORMAL HIGH (ref <1.0)

## 2021-01-12 MED ORDER — ASPIRIN EC 81 MG PO TBEC
81.0000 mg | DELAYED_RELEASE_TABLET | Freq: Every day | ORAL | Status: DC
  Administered 2021-01-12 – 2021-01-19 (×6): 81 mg via ORAL
  Filled 2021-01-12 (×5): qty 1

## 2021-01-12 MED ORDER — METOPROLOL SUCCINATE ER 25 MG PO TB24
25.0000 mg | ORAL_TABLET | Freq: Every day | ORAL | Status: DC
  Administered 2021-01-12: 25 mg via ORAL
  Filled 2021-01-12 (×2): qty 1

## 2021-01-12 MED ORDER — TAMSULOSIN HCL 0.4 MG PO CAPS
0.4000 mg | ORAL_CAPSULE | Freq: Every day | ORAL | Status: DC
  Administered 2021-01-12 – 2021-01-13 (×2): 0.4 mg via ORAL
  Filled 2021-01-12 (×2): qty 1

## 2021-01-12 MED ORDER — ATORVASTATIN CALCIUM 20 MG PO TABS
80.0000 mg | ORAL_TABLET | Freq: Every day | ORAL | Status: DC
  Administered 2021-01-12 – 2021-01-19 (×7): 80 mg via ORAL
  Filled 2021-01-12 (×6): qty 4

## 2021-01-12 NOTE — Consult Note (Signed)
[facility] Vascular Consult Note  MRN : [id number]  Brad Frost is a 65 y.o. (10-Mar-1956) male who presents with chief complaint of  Chief Complaint  Patient presents with   Foot Problem   History of Present Illness:  Brad Frost is a 65 y.o. male with medical history significant for chronic diabetic foot ulcers, peripheral vascular disease, type 2 diabetes mellitus, anemia of chronic disease associated with baseline hemoglobin 7.5-9, who is admitted to [facility] on 01/11/2021 with progressive osteomyelitis of a gangrenous right foot after presenting from home to [facility] ED complaining of worsening right foot ulcers.    The patient has a history of chronic diabetic right foot ulcers associated with osteomyelitis.  He has been undergoing outpatient wound care at the wound care clinic including a recent appointment they are on 12/26/2020, prompting referral to Dr. Gilda Crease of vascular surgery in the setting of worsening of right foot ulcers associated with gangrenous right foot.  Consequently, the patient was scheduled for an appointment in vascular surgery clinic with Dr. Gilda Crease on 01/01/21, although the patient reports that he was ultimately unable to attend that appointment.    He presents to [facility] ED this evening complaining of interval progression in the size and distribution of his right foot ulcers, namely the ulcer associated with the medial aspect of the right midfoot as well as an ulcer associated with the heel of the right foot.  He denies any associated drainage, increased swelling or worsening erythema.  He also denies any associated worsening of pain associated with the right foot, but conveys that this is within the context of diabetic peripheral polyneuropathy.  He also notes that a portion of his right second toe fell off during a routine outpatient dressing changes involving the right foot, which ultimately prompted the patient to  present to the emergency department for further evaluation.  He denies any associated subjective fever, chills, rigors, or generalized myalgias.   He confirms a history of acute bilateral pulmonary emboli in December 2020 for which he reports he completed a course of Xarelto, and conveys that he is no longer on anticoagulation measures and specifically denies taking any blood thinners as an outpatient, including no aspirin.   Denies any recent shortness of breath, orthopnea, or PND.  He also denies any recent chest pain, diaphoresis, palpitations, dizziness, presyncope, or syncope.  No recent headache, neck stiffness, sore throat, nausea, vomiting, abdominal pain, or diarrhea.  Denies any recent dysuria, gross hematuria, or change in urinary urgency/frequency.   Of note, the patient underwent left lower extremity angiography in May 2022 followed by right lower extremity angiography in June 2022.    He presented to [facility] ED approximately 3 weeks ago for evaluation of worsening of his diabetic right foot ulcers.  ED physician at that time recommended hospitalization for further evaluation management of osteomyelitis of a gangrenous right foot.  However, the patient left the ED AMA prior to being admitted.    ED Course:  Vital signs in the ED were notable for the following: Tetramex 98.4; heart rate 102; blood pressure 92/58 -120/76, with most recent blood pressures in the 110/72; respiratory rate 12-15, oxygen saturation 99 to 100% on room air.   Labs were notable for the following: CMP notable for carbonate 27, creatinine 0.73, glucose 99, calcium corrected for hypoalbuminemia 9.8, albumin 2.2, otherwise, liver enzymes found to be within normal limits.  Urinalysis been ordered, with result currently pending.  CBC notable  for blood cell count 11,000, hemoglobin 9.1 relative to most recent prior value of 7.8 on 10/10/2020, platelets 476.  Lactic acid x2 were both found to be 1.6.  INR 1.1.  Screening  COVID-19/influenza PCR were checked in the ED this evening and found to be negative.  Blood cultures checked prior to initiation of IV antibiotics in the ED today.   Imaging and additional notable ED work-up: Plain films of the right foot, in comparison to plain films performed on 12/18/2020 showed interval progression of soft tissue ulceration and osteomyelitis at multiple sites, without evidence of soft tissue gas..  Plain films of the right tib-fib showed no evidence of acute fracture or dislocation of the tib-fib/fib, including no evidence of cortical erosion or bone destruction, as well as no evidence of soft tissue gas.  Chest x-ray showed no evidence of acute cardiopulmonary process.   While in the ED, the following were administered: Cefepime, IV vancomycin, 2.5 L lactated Ringer bolus.  Subsequently, the patient was admitted for further evaluation and treatment of acute on chronic osteomyelitis of the right foot complicated by gangrene.   Vascular surgery was consulted by Dr. Ashok Pall for possible endovascular versus open intervention.   Current Facility-Administered Medications  Medication Dose Route Frequency Provider Last Rate Last Admin   acetaminophen (TYLENOL) tablet 650 mg  650 mg Oral Q6H PRN Howerter, Justin B, DO       Or   acetaminophen (TYLENOL) suppository 650 mg  650 mg Rectal Q6H PRN Howerter, Justin B, DO       atorvastatin (LIPITOR) tablet 80 mg  80 mg Oral Daily Kathrynn Running, MD   80 mg at 01/12/21 1049   ceFEPIme (MAXIPIME) 2 g in sodium chloride 0.9 % 100 mL IVPB  2 g Intravenous Q8H Thompson, Amy C, RPH   Paused at 01/12/21 0809   fentaNYL (SUBLIMAZE) injection 25 mcg  25 mcg Intravenous Q2H PRN Howerter, Justin B, DO       insulin aspart (novoLOG) injection 0-9 Units  0-9 Units Subcutaneous TID WC Howerter, Justin B, DO       lactated ringers infusion   Intravenous Continuous Howerter, Justin B, DO 150 mL/hr at 01/12/21 1000 Restarted at 01/12/21 1000   metoprolol  succinate (TOPROL-XL) 24 hr tablet 25 mg  25 mg Oral Daily Wouk, Wilfred Curtis, MD   25 mg at 01/12/21 1049   naloxone (NARCAN) injection 0.4 mg  0.4 mg Intravenous PRN Howerter, Justin B, DO       ondansetron (ZOFRAN) injection 4 mg  4 mg Intravenous Q6H PRN Howerter, Justin B, DO       tamsulosin (FLOMAX) capsule 0.4 mg  0.4 mg Oral Daily Wouk, Wilfred Curtis, MD   0.4 mg at 01/12/21 1049   vancomycin (VANCOREADY) IVPB 1250 mg/250 mL  1,250 mg Intravenous Q12H Doroteo Glassman, RPH   Stopped at 01/12/21 1236   Current Outpatient Medications  Medication Sig Dispense Refill   acetaminophen (TYLENOL) 325 MG tablet Take 2 tablets (650 mg total) by mouth every 4 (four) hours as needed for headache or mild pain.     clotrimazole (LOTRIMIN) 1 % cream Apply 1 application topically in the morning and at bedtime.     aspirin 81 MG tablet Take 81 mg by mouth daily. For prophylaxis (Patient not taking: Reported on 01/11/2021)     atorvastatin (LIPITOR) 20 MG tablet Take 4 tablets (80 mg total) by mouth daily. (Patient not taking: Reported on 01/11/2021)  metoprolol succinate (TOPROL-XL) 25 MG 24 hr tablet Take 1 tablet (25 mg total) by mouth daily. (Patient not taking: Reported on 01/11/2021)     rivaroxaban (XARELTO) 20 MG TABS tablet Take 1 tablet (20 mg total) by mouth at bedtime. (Patient not taking: Reported on 01/11/2021)     tamsulosin (FLOMAX) 0.4 MG CAPS capsule Take 0.4 mg by mouth daily. For urinary (Patient not taking: Reported on 01/11/2021)     Past Medical History:  Diagnosis Date   Diabetes mellitus without complication (HCC)    Hypertension    Peripheral vascular disease [facility])    Past Surgical History:  Procedure Laterality Date   LEG SURGERY  August 20, 2014   Right Leg  BPG   LOWER EXTREMITY ANGIOGRAPHY Left 09/19/2020   Procedure: Lower Extremity Angiography;  Surgeon: Renford Dills, MD;  Location: [facility] INVASIVE CV LAB;  Service: Cardiovascular;  Laterality: Left;   LOWER EXTREMITY  ANGIOGRAPHY Right 10/10/2020   Procedure: Lower Extremity Angiography;  Surgeon: Annice Needy, MD;  Location: [facility] INVASIVE CV LAB;  Service: Cardiovascular;  Laterality: Right;   Social History Social History   Tobacco Use   Smoking status: Every Day    Types: Cigarettes   Smokeless tobacco: Never  Substance Use Topics   Alcohol use: Yes    Comment: social, occassional   Drug use: No   Family History Family History  Problem Relation Age of Onset   Hypertension Mother   Denies family history of peripheral artery disease, venous disease or renal disease.  Allergies  Allergen Reactions   Sildenafil    REVIEW OF SYSTEMS (Negative unless checked)  Constitutional: Weight loss  Fever  Chills Cardiac: Chest pain   Chest pressure   Palpitations   Shortness of breath when laying flat   Shortness of breath at rest   Shortness of breath with exertion. Vascular:  Pain in legs with walking   Pain in legs at rest   Pain in legs when laying flat   Claudication   Pain in feet when walking  Pain in feet at rest  Pain in feet when laying flat   History of DVT   Phlebitis   Swelling in legs   Varicose veins   Non-healing ulcers Pulmonary:   Uses home oxygen   Productive cough   Hemoptysis   Wheeze  COPD   Asthma Neurologic:  Dizziness  Blackouts   Seizures   History of stroke   History of TIA  Aphasia   Temporary blindness   Dysphagia   Weakness or numbness in arms   Weakness or numbness in legs Musculoskeletal:  Arthritis   Joint swelling   Joint pain   Low back pain Hematologic:  Easy bruising  Easy bleeding   Hypercoagulable state   Anemic  Hepatitis Gastrointestinal:  Blood in stool   Vomiting blood  Gastroesophageal reflux/heartburn   Difficulty swallowing. Genitourinary:  Chronic kidney disease   Difficult urination  Frequent urination  Burning with urination   Blood in urine Skin:   Rashes   Ulcers   Wounds Psychological:  History of anxiety    History of major depression.  Physical Examination  Vitals:   01/12/21 1049 01/12/21 1100 01/12/21 1130 01/12/21 1200  BP:  100/63 (!) 90/57 (!) 93/57  Pulse: (!) 116 (!) 114 (!) 111 (!) 113  Resp:  Temp:      TempSrc:      SpO2:  95% 93% 93%  Weight:  Height:       Body mass index is 23.99 kg/m. Gen:  WD/WN, NAD Head: [place]/AT, No temporalis wasting. Prominent temp pulse not noted. Ear/Nose/Throat: Hearing grossly intact, nares w/o erythema or drainage, oropharynx w/o Erythema/Exudate Eyes: Sclera non-icteric, conjunctiva clear Neck: Trachea midline.  No JVD.  Pulmonary:  Good air movement, respirations not labored, equal bilaterally.  Cardiac: RRR, normal S1, S2. Vascular:  Vessel Right Left  Radial Palpable Palpable  Ulnar Palpable Palpable  Brachial Palpable Palpable  Carotid Palpable, without bruit Palpable, without bruit  Aorta Not palpable N/A  Femoral Palpable Palpable  Popliteal Palpable Palpable  PT Non-Palpable Non-Palpable  DP Non-Palpable Non-Palpable   Right lower extremity: Thigh soft.  Calf soft.  Extremities warm distally to toes.  Unable to palpate pedal pulses.  Poor capillary refill.  Multiple ulcerations essentially of the left foot noted to the right lower extremity.  Document Information Photos  Gangrenous foot  01/11/2021 18:31  Attached To:  Hospital Encounter on 01/11/21        Source Information Delton Prairie, MD  [facility]   Gastrointestinal: soft, non-tender/non-distended. No guarding/reflex.  Musculoskeletal: M/S 5/5 throughout.  Extremities without ischemic changes.  No deformity or atrophy. No edema. Neurologic: Sensation grossly intact in extremities.  Symmetrical.  Speech is fluent. Motor exam as listed above. Psychiatric: Judgment intact, Mood & affect appropriate for pt's clinical situation. Dermatologic: As above  Lymph : No  Cervical, Axillary, or Inguinal lymphadenopathy.  CBC Lab Results  Component Value Date   WBC 10.4 01/12/2021   HGB 8.0 (L) 01/12/2021   HCT 25.8 (L) 01/12/2021   MCV 71.1 (L) 01/12/2021   PLT 422 (H) 01/12/2021   BMET    Component Value Date/Time   NA 137 01/12/2021 0818   K 3.8 01/12/2021 0818   CL 106 01/12/2021 0818   CO2 23 01/12/2021 0818   GLUCOSE 89 01/12/2021 0818   BUN 9 01/12/2021 0818   CREATININE 0.62 01/12/2021 0818   CALCIUM 7.9 (L) 01/12/2021 0818   GFRNONAA >60 01/12/2021 0818   GFRAA >60 04/14/2019 1345   Estimated Creatinine Clearance: 95.4 mL/min (by C-G formula based on SCr of 0.62 mg/dL).  COAG Lab Results  Component Value Date   INR 1.1 01/12/2021   INR 1.1 01/11/2021   INR 1.2 10/10/2020   Radiology DG Tibia/Fibula Right  Result Date: 01/11/2021 CLINICAL DATA:  Gangrenous foot. EXAM: RIGHT TIBIA AND FIBULA - 2 VIEW COMPARISON:  None. FINDINGS: Mild degenerative changes demonstrated in the right knee with moderate degenerative changes in the right ankle. No evidence of acute fracture or dislocation of the tibia or fibula. No cortical erosion or bone destruction. No focal bone lesions. No significant effusion in the knee. Surgical clips in the soft tissues. Prominent vascular calcifications. No radiopaque soft tissue foreign body or soft tissue gas identified. IMPRESSION: No acute bony abnormalities. Degenerative changes in the knee and ankle joints. Prominent vascular calcifications. Electronically Signed   By: Burman Nieves M.D.   On: 01/11/2021 19:28   DG Chest Port 1 View  Result Date: 01/11/2021 CLINICAL DATA:  Sepsis EXAM: PORTABLE CHEST 1 VIEW COMPARISON:  10/09/2020 FINDINGS: The heart size and mediastinal contours are within normal limits. Elevation of the left hemidiaphragm with associated atelectasis in the left lung base. No pleural effusion or pneumothorax. No focal pulmonary opacity. No acute osseous abnormality. IMPRESSION: No active  disease. Electronically Signed   By: Wiliam Ke M.D.   On: 01/11/2021 19:23   DG  Foot Complete Right  Result Date: 01/11/2021 CLINICAL DATA:  Gangrenous foot. EXAM: RIGHT FOOT COMPLETE - 3+ VIEW COMPARISON:  12/18/2020 FINDINGS: Diffuse bone demineralization. Severe soft tissue ulcerations involving the toes and with a large ulceration over the posterior calcaneus. No radiopaque soft tissue foreign bodies or soft tissue gas collections. Mild soft tissue swelling over the dorsum of the foot. 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 There is progression of these changes since the previous study. Changes are consistent with multiple sites of osteomyelitis. Degenerative changes and sclerosis in the hindfoot and intertarsal joints suggest developing Charcot joint. Prominent vascular calcifications. IMPRESSION: Progressive changes of soft tissue ulceration and osteomyelitis at multiple sites as discussed above. Electronically Signed   By: Burman Nieves M.D.   On: 01/11/2021 19:27   DG Foot Complete Right  Result Date: 12/18/2020 CLINICAL DATA:  Chronic right foot wound with maggot infestation. EXAM: RIGHT FOOT COMPLETE - 3+ VIEW COMPARISON:  None. FINDINGS: The bones of the right foot are diffusely osteopenic. There is diffuse soft tissue swelling of the right foot. There are soft tissue ulcers involving the heel, likely representing a decubitus ulcer of the heel, as well as medial to the first metatarsophalangeal joint, involving the lateral aspect of the great toe, the medial aspect of the second digit, and the distal aspect of the fourth and fifth digits. There are erosive changes involving the first metatarsal head medially in keeping with changes of osteomyelitis. There is probable exposure of the distal phalanx of the  second, fourth, and fifth digits by overlying ulcers with erosive changes involving the second through fifth distal phalanges in keeping with osteomyelitis. No superimposed fracture. Advanced vascular calcifications are seen throughout the right foot. IMPRESSION: Multiple ulcerations of the right foot with erosive changes in keeping with osteomyelitis involving the first metatarsal head and distal phalanges of the second through fifth digits. Decubitus heel ulcer. Peripheral vascular disease. Electronically Signed   By: Helyn Numbers M.D.   On: 12/18/2020 23:00    Assessment/Plan KELAN PRITT is a 65 year old male with medical history significant for chronic diabetic foot ulcers, peripheral vascular disease, type 2 diabetes mellitus, anemia of chronic disease associated with baseline hemoglobin 7.5-9, who is admitted to [facility] on 01/11/2021 with progressive osteomyelitis of a gangrenous right foot after presenting from home to [facility] ED complaining of worsening right foot ulcers.   1.  Atherosclerotic disease with ulceration and gangrene: The patient has a known history of atherosclerotic disease with gangrenous changes to the right foot requiring endovascular intervention x2 end of May early June of this year.  Unfortunately, the patient was lost to follow-up but now presents with progressively worsening nonhealing chronic wound formation to the same extremity.  Patient will most likely need a below the knee versus above-the-knee amputation due to the severity of his wounds.  Recommend undergoing a right lower extremity angiogram and attempt to assess his anatomy and contributing degree of atherosclerotic disease.  If appropriate an attempt to revascularize and optimized extremity for amputation can occur at that time.  Amputation most likely Thursday.  Procedure, risk and benefits were explained to the patient.  All questions were answered.  The patient wished to proceed.  2.   Type 2 diabetes: Diet controlled? Encouraged good control as its slows the progression  of atherosclerotic disease   3.  Hyperlipidemia: On aspirin and statin for medical management Unsure if the patient is compliant medications Encouraged good control as its slows the progression of atherosclerotic disease   4. Tobacco abuse: We had a discussion for approximately three minutes regarding the absolute need for smoking cessation due to the deleterious nature of tobacco on the vascular system. We discussed the tobacco use would diminish patency of any intervention, and likely significantly worsen progressio of disease. We discussed multiple agents for quitting including replacement therapy or medications to reduce cravings such as Chantix. The patient voices their understanding of the importance of smoking cessation.   Discussed with Dr. Weldon Inches, PA-C  01/12/2021 12:58 PM  This note was created with Dragon medical transcription system.  Any error is purely unintentional.

## 2021-01-12 NOTE — ED Notes (Signed)
When starting abx, pt reports pain in IV site. IV removed. Pt refusing to allow RN to attempt IV at this time. Pt requesting [place] IV.

## 2021-01-12 NOTE — ED Notes (Signed)
Pt refusing for this writer to obtain CBG at this time. Pt states, "Fuck that, why do you have to prick my finger again? I haven't been nowhere or ate anything. I'm not letting you do this shit to me." I assured pt this was what the MD ordered and it was routine, pt continues to refuse despite the fact that the MD ordered this. Explained to pt the importance of obtaining regular CBG checks and pt continues to curse and refuse at this time. Lowella Bandy, RN notified.

## 2021-01-12 NOTE — ED Notes (Signed)
This Clinical research associate into obtain VS and pt became agitated stating, "please stop waking me up every 5 fucking minutes". This writer explained to pt the importance of obtaining VS, pt agreeable but takes cuff off as soon as BP is obtained. This Clinical research associate also asked to obtain CBG and pt refused stating, "I don't want it checked anymore. I already told you I want to be left alone". Maggie, RN notified.

## 2021-01-12 NOTE — Progress Notes (Signed)
Update: per general [facility] subspecialist preference, non-emergent  consult request sent to Dr. Lynnea Ferrier of vascular surgery via Staff Message to be released at 0600 on 01/12/21.     Newton Pigg, DO Hospitalist

## 2021-01-12 NOTE — Progress Notes (Addendum)
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  He has been undergoing outpatient wound care at the wound care clinic including a recent appointment they are on 12/26/2020, prompting referral to Dr. Gilda Frost of vascular surgery in the setting of worsening of right foot ulcers associated with gangrenous right foot.  Consequently, the patient was scheduled for an appointment in vascular surgery clinic with Dr. Gilda Frost on 01/01/21, although the patient reports that he was ultimately unable to attend that appointment.    He presents to [facility] ED this evening complaining of interval progression in the size and distribution of his right foot ulcers, namely the ulcer associated with the medial aspect of the right midfoot as well as an ulcer associated with the heel of the right foot.  He denies any associated drainage, increased swelling or worsening erythema.  He also denies any associated worsening of pain associated with the right foot, but conveys that this is within the context of diabetic peripheral polyneuropathy.  He also notes that a portion of his right second toe fell off during a routine outpatient dressing changes involving the right foot, which ultimately prompted the patient to present to the emergency department for further evaluation.  He denies any associated subjective fever, chills, rigors, or generalized myalgias.   He confirms a history of acute bilateral pulmonary emboli in December 2020 for which he reports he completed a course of Xarelto, and conveys that he is no longer on anticoagulation measures and specifically denies taking any blood thinners as an outpatient, including no aspirin.   Denies any recent shortness of breath, orthopnea, or PND.  He also denies any  recent chest pain, diaphoresis, palpitations, dizziness, presyncope, or syncope.  No recent headache, neck stiffness, sore throat, nausea, vomiting, abdominal pain, or diarrhea.  Denies any recent dysuria, gross hematuria, or change in urinary urgency/frequency.   Of note, the patient underwent left lower extremity angiography in May 2022 followed by right lower extremity angiography in June 2022.    [facility] approximately 3 weeks ago for evaluation of worsening of his diabetic right foot ulcers.  ED physician at that time recommended hospitalization for further evaluation management of osteomyelitis of a gangrenous right foot.  However, the patient left the ED AMA prior to being admitted.    Assessment & Plan:   Principal Problem:   Osteomyelitis of right foot (HCC) Active Problems:   Type 2 diabetes mellitus with diabetic neuropathy, without long-term current use of insulin (HCC)   Gangrene of right foot (HCC)   Anemia of chronic disease   Sepsis (HCC)   Tobacco abuse  # Right foot osteomyelitis # Sepsis # Peripheral arterial disease Several months worsening osteomyelitis and dry gangrene of right foot. Hospitalized for such earlier this year. 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 AKA advised, patient opted to pursue second opinion though appears he never did. Returns with worsening disease of right lower extremity. Here leukocytosis and tachycardia, soft BPs, normal lactate, mentation appears to be at baseline. - continue vanc/cefepime - vascular surgery consulted - continue fluids - npo for now - pain appears well controlled - cont statin, aspirin. Re-start xarelto after surgery  # T2DM Diet controlled -  daily fasting sugars  # History PE - re-start xarelto after surgery  # Tobacco abuse - declines nicotine patch  # Etoh abuse Denies recent etoh abuse, not available at his  snf   DVT prophylaxis: SCD for now Code Status: full Family Communication: none @ bedside  Level of care: Med-Surg Status is: Inpatient  Remains inpatient appropriate because:Inpatient level of care appropriate due to severity of illness  Dispo: The patient is from: snf               Anticipated d/c is to:  snf              Patient currently is not medically stable to d/c.   Difficult to place patient No        Consultants:  Vascular surgery  Procedures: none  Antimicrobials:  Vanc/cefepime 9/18>    Subjective: Denies pain, no fever or chills, has appetite  Objective: Vitals:   01/12/21 0500 01/12/21 0532 01/12/21 0736 01/12/21 0745  BP: (!) 89/52 93/61 (!) 93/52   Pulse: (!) 117 (!) 119 (!) 122   Resp: 18 18 (!) 22   Temp:    98.8 F (37.1 C)  TempSrc:    Oral  SpO2: 97% 99% 97%   Weight:      Height:        Intake/Output Summary (Last 24 hours) at 01/12/2021 0755 Last data filed at 01/12/2021 0749 Gross per 24 hour  Intake 3190 ml  Output 575 ml  Net 2615 ml   Filed Weights   01/11/21 1826  Weight: 78 kg    Examination:  General exam: Appears calm and comfortable  Respiratory system: Clear to auscultation. Respiratory effort normal. Cardiovascular system: S1 & S2 heard, soft systolic murmur, tachycardic Gastrointestinal system: Abdomen is nondistended, soft and nontender. No organomegaly or masses felt. Normal bowel sounds heard. Central nervous system: Alert and oriented. No focal neurological deficits. Extremities: Symmetric 5 x 5 power. Skin: lichenification of LEs withs ig scale, significant ulceration posterior heels, bone visible several toes with sig surrounding ulcer Psychiatry: Judgement and insight appear normal. Mood & affect appropriate.     Data Reviewed: I have personally reviewed following labs and imaging studies  CBC: Recent Labs  Lab 01/11/21 1951  WBC 11.0*  NEUTROABS 7.7  HGB 9.1*  HCT 29.5*  MCV 73.8*  PLT 476*    Basic Metabolic Panel: Recent Labs  Lab 01/11/21 1936 01/11/21 1951  NA  --  138  K  --  4.6  CL  --  107  CO2  --  27  GLUCOSE  --  99  BUN  --  13  CREATININE  --  0.73  CALCIUM  --  8.4*  MG 2.1  --    GFR: Estimated Creatinine Clearance: 95.4 mL/min (by C-G formula based on SCr of 0.73 mg/dL). Liver Function Tests: Recent Labs  Lab 01/11/21 1951  AST 24  ALT 27  ALKPHOS 87  BILITOT 0.5  PROT 6.3*  ALBUMIN 2.2*   No results for input(s): LIPASE, AMYLASE in the last 168 hours. No results for input(s): AMMONIA in the last 168 hours. Coagulation Profile: Recent Labs  Lab 01/11/21 1951  INR 1.1   Cardiac Enzymes: No results for input(s): CKTOTAL, CKMB, CKMBINDEX, TROPONINI in the last 168 hours. BNP (last 3 results) No results for input(s): PROBNP in the last 8760 hours. HbA1C: No results for input(s): HGBA1C in the last 72 hours. CBG: Recent Labs  Lab 01/11/21 2014  01/11/21 2327  GLUCAP 78 112*   Lipid Profile: No results for input(s): CHOL, HDL, LDLCALC, TRIG, CHOLHDL, LDLDIRECT in the last 72 hours. Thyroid Function Tests: No results for input(s): TSH, T4TOTAL, FREET4, T3FREE, THYROIDAB in the last 72 hours. Anemia Panel: No results for input(s): VITAMINB12, FOLATE, FERRITIN, TIBC, IRON, RETICCTPCT in the last 72 hours. Urine analysis: No results found for: COLORURINE, APPEARANCEUR, LABSPEC, PHURINE, GLUCOSEU, HGBUR, BILIRUBINUR, KETONESUR, PROTEINUR, UROBILINOGEN, NITRITE, LEUKOCYTESUR Sepsis Labs: @LABRCNTIP (procalcitonin:4,lacticidven:4)  ) Recent Results (from the past 240 hour(s))  Blood culture (routine single)     Status: None (Preliminary result)   Collection Time: 01/11/21  7:51 PM   Specimen: BLOOD  Result Value Ref Range Status   Specimen Description BLOOD LEFT ANTECUBITAL  Final   Special Requests   Final    BOTTLES DRAWN AEROBIC AND ANAEROBIC Blood Culture adequate volume   Culture   Final    NO GROWTH < 12 HOURS Performed at  [facility], [address], [place], [place]    Report Status PENDING  Incomplete  Resp Panel by RT-PCR (Flu A&B, Covid) Nasopharyngeal Swab     Status: None   Collection Time: 01/11/21  9:35 PM   Specimen: Nasopharyngeal Swab; Nasopharyngeal(NP) swabs in vial transport medium  Result Value Ref Range Status   SARS Coronavirus 2 by RT PCR NEGATIVE NEGATIVE Final    Comment: (NOTE) SARS-CoV-2 target nucleic acids are NOT DETECTED.  The SARS-CoV-2 RNA is generally detectable in upper respiratory specimens during the acute phase of infection. The lowest concentration of SARS-CoV-2 viral copies this assay can detect is 138 copies/mL. A negative result does not preclude SARS-Cov-2 infection and should not be used as the sole basis for treatment or other patient management decisions. A negative result may occur with  improper specimen collection/handling, submission of specimen other than nasopharyngeal swab, presence of viral mutation(s) within the areas targeted by this assay, and inadequate number of viral copies(<138 copies/mL). A negative result must be combined with clinical observations, patient history, and epidemiological information. The expected result is Negative.  Fact Sheet for Patients:  01/13/21  Fact Sheet for Healthcare Providers:  BloggerCourse.com  This test is no t yet approved or cleared by the [employer]ious[employer].[place] FDA and  has been authorized for detection and/or diagnosis of SARS-CoV-2 by FDA under an Emergency Use Authorization (EUA). This EUA will remain  in effect (meaning this test can be used) for the duration of the COVID-19 declaration under Section 564(b)(1) of the Act, 21 U.S.C.section 360bbb-3(b)(1), unless the authorization is terminated  or revoked sooner.       Influenza A by PCR NEGATIVE NEGATIVE Final   Influenza B by PCR NEGATIVE NEGATIVE Final    Comment: (NOTE) The  Xpert Xpress SARS-CoV-2/FLU/RSV plus assay is intended as an aid in the diagnosis of influenza from Nasopharyngeal swab specimens and should not be used as a sole basis for treatment. Nasal washings and aspirates are unacceptable for Xpert Xpress SARS-CoV-2/FLU/RSV testing.  Fact Sheet for Patients: [place]  Fact Sheet for Healthcare Providers: BloggerCourse.com  This test is not yet approved or cleared by the Serious[employer]ker.it FDA and has been authorized for detection and/or diagnosis of SARS-CoV-2 by FDA under an Emergency Use Authorization (EUA). This EUA will remain in effect (meaning this test can be used) for the duration of the COVID-19 declaration under Section 564(b)(1) of the Act, 21 U.S.C. section 360bbb-3(b)(1), unless the authorization is terminated or revoked.  Performed at [facility],  [address], [place], [place] [address]          Radiology Studies: DG Tibia/Fibula Right  Result Date: 01/11/2021 CLINICAL DATA:  Gangrenous foot. EXAM: RIGHT TIBIA AND FIBULA - 2 VIEW COMPARISON:  None. FINDINGS: Mild degenerative changes demonstrated in the right knee with moderate degenerative changes in the right ankle. No evidence of acute fracture or dislocation of the tibia or fibula. No cortical erosion or bone destruction. No focal bone lesions. No significant effusion in the knee. Surgical clips in the soft tissues. Prominent vascular calcifications. No radiopaque soft tissue foreign body or soft tissue gas identified. IMPRESSION: No acute bony abnormalities. Degenerative changes in the knee and ankle joints. Prominent vascular calcifications. Electronically Signed   By: Burman Nieves M.D.   On: 01/11/2021 19:28   DG Chest Port 1 View  Result Date: 01/11/2021 CLINICAL DATA:  Sepsis EXAM: PORTABLE CHEST 1 VIEW COMPARISON:  10/09/2020 FINDINGS: The heart size and mediastinal contours are within normal  limits. Elevation of the left hemidiaphragm with associated atelectasis in the left lung base. No pleural effusion or pneumothorax. No focal pulmonary opacity. No acute osseous abnormality. IMPRESSION: No active disease. Electronically Signed   By: Wiliam Ke M.D.   On: 01/11/2021 19:23   DG Foot Complete Right  Result Date: 01/11/2021 CLINICAL DATA:  Gangrenous foot. EXAM: RIGHT FOOT COMPLETE - 3+ VIEW COMPARISON:  12/18/2020 FINDINGS: Diffuse bone demineralization. Severe soft tissue ulcerations involving the toes and with a large ulceration over the posterior calcaneus. No radiopaque soft tissue foreign bodies or soft tissue gas collections. Mild soft tissue swelling over the dorsum of the foot. 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 There is progression of these changes since the previous study. Changes are consistent with multiple sites of osteomyelitis. Degenerative changes and sclerosis in the hindfoot and intertarsal joints suggest developing Charcot joint. Prominent vascular calcifications. IMPRESSION: Progressive changes of soft tissue ulceration and osteomyelitis at multiple sites as discussed above. Electronically Signed   By: Burman Nieves M.D.   On: 01/11/2021 19:27        Scheduled Meds:  insulin aspart  0-9 Units Subcutaneous TID WC   nicotine  21 mg Transdermal Once   Continuous Infusions:  ceFEPime (MAXIPIME) IV     lactated ringers 150 mL/hr at 01/12/21 0451   vancomycin       LOS: 1 day    Time spent: 40 min    Silvano Bilis, MD [facility]   If 7PM-7AM, please contact night-coverage [email or web address] Password [facility] 01/12/2021, 7:55 AM

## 2021-01-12 NOTE — ED Notes (Signed)
Pt complaining of pain at IV site. Right arm is swollen from elbow to hand. IV removed.

## 2021-01-13 ENCOUNTER — Other Ambulatory Visit (INDEPENDENT_AMBULATORY_CARE_PROVIDER_SITE_OTHER): Payer: Self-pay | Admitting: Vascular Surgery

## 2021-01-13 ENCOUNTER — Other Ambulatory Visit: Payer: Self-pay

## 2021-01-13 LAB — BASIC METABOLIC PANEL
Anion gap: 6 (ref 5–15)
Anion gap: 6 (ref 5–15)
BUN: 10 mg/dL (ref 8–23)
BUN: 9 mg/dL (ref 8–23)
CO2: 22 mmol/L (ref 22–32)
CO2: 22 mmol/L (ref 22–32)
Calcium: 7.4 mg/dL — ABNORMAL LOW (ref 8.9–10.3)
Calcium: 7.5 mg/dL — ABNORMAL LOW (ref 8.9–10.3)
Chloride: 106 mmol/L (ref 98–111)
Chloride: 106 mmol/L (ref 98–111)
Creatinine, Ser: 0.74 mg/dL (ref 0.61–1.24)
Creatinine, Ser: 0.74 mg/dL (ref 0.61–1.24)
GFR, Estimated: >60 mL/min (ref >60)
GFR, Estimated: >60 mL/min (ref >60)
Glucose, Bld: 84 mg/dL (ref 70–99)
Glucose, Bld: 82 mg/dL (ref 70–99)
Potassium: 3.4 mmol/L — ABNORMAL LOW (ref 3.5–5.1)
Potassium: 3.4 mmol/L — ABNORMAL LOW (ref 3.5–5.1)
Sodium: 134 mmol/L — ABNORMAL LOW (ref 135–145)
Sodium: 134 mmol/L — ABNORMAL LOW (ref 135–145)

## 2021-01-13 LAB — CBC
HCT: 24 % — ABNORMAL LOW (ref 39.0–52.0)
Hemoglobin: 7.8 g/dL — ABNORMAL LOW (ref 13.0–17.0)
MCH: 22.9 pg — ABNORMAL LOW (ref 26.0–34.0)
MCHC: 32.5 g/dL (ref 30.0–36.0)
MCV: 70.4 fL — ABNORMAL LOW (ref 80.0–100.0)
Platelets: 400 10*3/uL (ref 150–400)
RBC: 3.41 MIL/uL — ABNORMAL LOW (ref 4.22–5.81)
RDW: 20.7 % — ABNORMAL HIGH (ref 11.5–15.5)
WBC: 11.9 10*3/uL — ABNORMAL HIGH (ref 4.0–10.5)
nRBC: 0 % (ref 0.0–0.2)

## 2021-01-13 LAB — URINE CULTURE: Culture: NO GROWTH

## 2021-01-13 LAB — GLUCOSE, CAPILLARY
Glucose-Capillary: 66 mg/dL — ABNORMAL LOW (ref 70–99)
Glucose-Capillary: 68 mg/dL — ABNORMAL LOW (ref 70–99)
Glucose-Capillary: 90 mg/dL (ref 70–99)

## 2021-01-13 LAB — LACTIC ACID, PLASMA: Lactic Acid, Venous: 0.8 mmol/L (ref 0.5–1.9)

## 2021-01-13 MED ORDER — LACTATED RINGERS IV BOLUS
1000.0000 mL | Freq: Once | INTRAVENOUS | Status: AC
  Administered 2021-01-13: 1000 mL via INTRAVENOUS

## 2021-01-13 MED ORDER — SODIUM CHLORIDE 0.9 % IV BOLUS
500.0000 mL | Freq: Once | INTRAVENOUS | Status: AC
  Administered 2021-01-13: 500 mL via INTRAVENOUS

## 2021-01-13 MED ORDER — METRONIDAZOLE 500 MG/100ML IV SOLN
500.0000 mg | Freq: Three times a day (TID) | INTRAVENOUS | Status: DC
  Administered 2021-01-13 – 2021-01-18 (×12): 500 mg via INTRAVENOUS
  Filled 2021-01-13 (×15): qty 100

## 2021-01-13 MED ORDER — SODIUM CHLORIDE 0.9 % IV SOLN: INTRAVENOUS | Status: DC

## 2021-01-13 MED ORDER — LACTATED RINGERS IV SOLN: INTRAVENOUS | Status: DC

## 2021-01-13 NOTE — Progress Notes (Signed)
RED MEWS triggered d/t pulse and BP. Notified Dr. Shonna Chock. IV Bolus of LR ordered.

## 2021-01-13 NOTE — Progress Notes (Signed)
Pt refused morning lab draw. Appealed to the pt to have lab drawn. Explained the benefit of morning lab draw. He refused and used inappropriate languages.

## 2021-01-13 NOTE — Progress Notes (Signed)
Pt's vital and ECG show HR of 116. Notified HCP. Ordered NS bolus. Current IV access partially occluded.  Patient refuses IV access, he wants to sleep till morning.  .BP 119/68 (BP Location: Right Arm)   Pulse (!) 116   Temp 98.7 F (37.1 C) (Oral)   Resp 17   Ht 5\' 11"  (1.803 m)   Wt 78 kg   SpO2 96%   BMI 23.99 kg/m

## 2021-01-13 NOTE — TOC Progression Note (Addendum)
Transition of Care [facility]) - Progression Note    Patient Details  Name: Brad [facility] MRN: [id number] Date of Birth: 1955/08/11  Transition of Care [facility] CM/SW Contact  Barrie Dunker, RN Phone Number: 01/13/2021, 9:10 AM  Clinical Narrative:   The patient is from [place], he has been going to out patient wound care, they encouraged the patient to go to an appointment at the vascular clinic which the patient missed on 9/8, he refused admission to the hospital 3 weeks ago, Since this admission he has refused a new IV last night and refused to allow morning blood draws.  TOC will monitor for needs and assist  Update, Angio was completed, AKA is planned for tomorrow, the patient continues to refuse CBG and other treatments       Expected Discharge Plan and Services                                                 Social Determinants of Health (SDOH) Interventions    Readmission Risk Interventions No flowsheet data found.

## 2021-01-13 NOTE — Progress Notes (Signed)
VAST consult received to obtain IV access. Spoke with Misty Stanley, patient's nurse who stated patient has one IV but physician is requesting 2nd IV for meds and because patient will be going for surgery tomorrow. Contacted Dr. Ashok Pall via SecureChat; educated best practice is to limit IV access until it is needed to allow for vessel preservation and decrease infection risk. Asked if he would be willing to switch patient's fluids to LR so that Maxipime would be compatible and allow both his antibiotics as well as fluids to infuse through one IV (this would allow [place] to preserve his vessels for future access) and then place a second IV closer to time for surgery. Dr Ashok Pall replied that he was in agreement with above plan.  Advised pt's nurse to pass along in report for nurse to place VAST consult 2-3 hours prior to surgery for placement of 2nd IV unless further access is needed prior to that time.

## 2021-01-13 NOTE — Progress Notes (Addendum)
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  He has been undergoing outpatient wound care at the wound care clinic including a recent appointment they are on 12/26/2020, prompting referral to Dr. Gilda Crease of vascular surgery in the setting of worsening of right foot ulcers associated with gangrenous right foot.  Consequently, the patient was scheduled for an appointment in vascular surgery clinic with Dr. Gilda Crease on 01/01/21, although the patient reports that he was ultimately unable to attend that appointment.    He presents to [facility] ED this evening complaining of interval progression in the size and distribution of his right foot ulcers, namely the ulcer associated with the medial aspect of the right midfoot as well as an ulcer associated with the heel of the right foot.  He denies any associated drainage, increased swelling or worsening erythema.  He also denies any associated worsening of pain associated with the right foot, but conveys that this is within the context of diabetic peripheral polyneuropathy.  He also notes that a portion of his right second toe fell off during a routine outpatient dressing changes involving the right foot, which ultimately prompted the patient to present to the emergency department for further evaluation.  He denies any associated subjective fever, chills, rigors, or generalized myalgias.   He confirms a history of acute bilateral pulmonary emboli in December 2020 for which he reports he completed a course of Xarelto, and conveys that he is no longer on anticoagulation measures and specifically denies taking any blood thinners as an outpatient, including no aspirin.   Denies any recent shortness of breath, orthopnea, or PND.  He also denies any  recent chest pain, diaphoresis, palpitations, dizziness, presyncope, or syncope.  No recent headache, neck stiffness, sore throat, nausea, vomiting, abdominal pain, or diarrhea.  Denies any recent dysuria, gross hematuria, or change in urinary urgency/frequency.   Of note, the patient underwent left lower extremity angiography in May 2022 followed by right lower extremity angiography in June 2022.    He presented to [facility] ED approximately 3 weeks ago for evaluation of worsening of his diabetic right foot ulcers.  ED physician at that time recommended hospitalization for further evaluation management of osteomyelitis of a gangrenous right foot.  However, the patient left the ED AMA prior to being admitted.    Assessment & Plan:   Principal Problem:   Osteomyelitis of right foot (HCC) Active Problems:   Type 2 diabetes mellitus with diabetic neuropathy, without long-term current use of insulin (HCC)   Gangrene of right foot (HCC)   Anemia of chronic disease   Sepsis (HCC)   Tobacco abuse  # Right foot osteomyelitis # Sepsis # Peripheral arterial disease Several months worsening osteomyelitis and dry gangrene of right foot. Hospitalized for such earlier this year. 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 AKA advised, patient opted to pursue second opinion though appears he never did. Returns with worsening disease of right lower extremity. Here leukocytosis and tachycardia, soft BPs, normal lactate, mentation appears to be at baseline. Vascular surgery following, plan for angiogram tomorrow, likely amputation 9/22. Sinus tachycardia this AM on ekg - continue vanc/cefepime, will add flagyl given dm - continue fluids (patient refused overnight but acquiesces today) -  pain appears well controlled - cont statin, aspirin. Re-start xarelto after surgery  # uncooperative Frequently refuses blood draws, other interventions. Is  re-directable. Has left AMA in the past. If outright refuses necessary care would need capacity evaluation. I think he likely lacks capacity and thus would need IVC.  # T2DM Diet controlled - daily fasting sugars  # History PE - re-start xarelto after surgery  # Tobacco abuse - declines nicotine patch  # Etoh abuse Denies recent etoh abuse, not available at his snf  # BPH - hold flomax given hypotension   DVT prophylaxis: SCD for now Code Status: full Family Communication: none @ bedside  Level of care: Med-Surg Status is: Inpatient  Remains inpatient appropriate because:Inpatient level of care appropriate due to severity of illness  Dispo: The patient is from: snf               Anticipated d/c is to:  snf              Patient currently is not medically stable to d/c.   Difficult to place patient No    Consultants:  Vascular surgery  Procedures: none  Antimicrobials:  Vanc/cefepime 9/18>    Subjective: Denies pain, no fever or chills, has appetite  Objective: Vitals:   01/12/21 1929 01/12/21 2130 01/12/21 2228 01/13/21 0835  BP: (!) 92/53 (!) 93/55 119/68 95/66  Pulse: (!) 112 (!) 110 (!) 116 (!) 123  Resp: 18 15 17 18   Temp: 98.6 F (37 C)  98.7 F (37.1 C) 98.8 F (37.1 C)  TempSrc: Oral  Oral   SpO2: 95% 95% 96% 98%  Weight:      Height:        Intake/Output Summary (Last 24 hours) at 01/13/2021 0936 Last data filed at 01/13/2021 0500 Gross per 24 hour  Intake 1516.23 ml  Output 1300 ml  Net 216.23 ml   Filed Weights   01/11/21 1826  Weight: 78 kg    Examination:  General exam: Appears calm and comfortable  Respiratory system: Clear to auscultation. Respiratory effort normal. Cardiovascular system: S1 & S2 heard, soft systolic murmur, tachycardic Gastrointestinal system: Abdomen is nondistended, soft and nontender. No organomegaly or masses felt. Normal bowel sounds heard. Central nervous system: Alert and oriented. No focal  neurological deficits. Extremities: Symmetric 5 x 5 power. Skin: lichenification of LEs withs ig scale, significant ulceration posterior heels, bone visible several toes with sig surrounding ulcer Psychiatry: Judgement and insight appear normal. Mood & affect appropriate.   From 9/18:      Data Reviewed: I have personally reviewed following labs and imaging studies  CBC: Recent Labs  Lab 01/11/21 1951 01/12/21 0818  WBC 11.0* 10.4  NEUTROABS 7.7 7.4  HGB 9.1* 8.0*  HCT 29.5* 25.8*  MCV 73.8* 71.1*  PLT 476* 422*   Basic Metabolic Panel: Recent Labs  Lab 01/11/21 1936 01/11/21 1951 01/12/21 0818  NA  --  138 137  K  --  4.6 3.8  CL  --  107 106  CO2  --  27 23  GLUCOSE  --  99 89  BUN  --  13 9  CREATININE  --  0.73 0.62  CALCIUM  --  8.4* 7.9*  MG 2.1  --  1.7   GFR: Estimated Creatinine Clearance: 95.4 mL/min (by C-G formula based on SCr of 0.62 mg/dL). Liver Function Tests: Recent Labs  Lab 01/11/21 1951 01/12/21 0818  AST 24 19  ALT 27 20  ALKPHOS  87 68  BILITOT 0.5 0.7  PROT 6.3* 5.2*  ALBUMIN 2.2* 1.8*   No results for input(s): LIPASE, AMYLASE in the last 168 hours. No results for input(s): AMMONIA in the last 168 hours. Coagulation Profile: Recent Labs  Lab 01/11/21 1951 01/12/21 0818  INR 1.1 1.1   Cardiac Enzymes: No results for input(s): CKTOTAL, CKMB, CKMBINDEX, TROPONINI in the last 168 hours. BNP (last 3 results) No results for input(s): PROBNP in the last 8760 hours. HbA1C: No results for input(s): HGBA1C in the last 72 hours. CBG: Recent Labs  Lab 01/11/21 2014 01/11/21 2327 01/12/21 0804 01/13/21 0836  GLUCAP 78 112* 93 66*   Lipid Profile: No results for input(s): CHOL, HDL, LDLCALC, TRIG, CHOLHDL, LDLDIRECT in the last 72 hours. Thyroid Function Tests: No results for input(s): TSH, T4TOTAL, FREET4, T3FREE, THYROIDAB in the last 72 hours. Anemia Panel: No results for input(s): VITAMINB12, FOLATE, FERRITIN, TIBC,  IRON, RETICCTPCT in the last 72 hours. Urine analysis:    Component Value Date/Time   COLORURINE YELLOW 01/12/2021 0747   APPEARANCEUR CLEAR 01/12/2021 0747   LABSPEC 1.020 01/12/2021 0747   PHURINE 5.5 01/12/2021 0747   GLUCOSEU NEGATIVE 01/12/2021 0747   HGBUR NEGATIVE 01/12/2021 0747   BILIRUBINUR NEGATIVE 01/12/2021 0747   KETONESUR NEGATIVE 01/12/2021 0747   PROTEINUR NEGATIVE 01/12/2021 0747   NITRITE NEGATIVE 01/12/2021 0747   LEUKOCYTESUR NEGATIVE 01/12/2021 0747   Sepsis Labs: @LABRCNTIP (procalcitonin:4,lacticidven:4)  ) Recent Results (from the past 240 hour(s))  Blood culture (routine single)     Status: None (Preliminary result)   Collection Time: 01/11/21  7:51 PM   Specimen: BLOOD  Result Value Ref Range Status   Specimen Description BLOOD LEFT ANTECUBITAL  Final   Special Requests   Final    BOTTLES DRAWN AEROBIC AND ANAEROBIC Blood Culture adequate volume   Culture   Final    NO GROWTH 2 DAYS Performed at [facility], [address], [place], [place]    Report Status PENDING  Incomplete  Resp Panel by RT-PCR (Flu A&B, Covid) Nasopharyngeal Swab     Status: None   Collection Time: 01/11/21  9:35 PM   Specimen: Nasopharyngeal Swab; Nasopharyngeal(NP) swabs in vial transport medium  Result Value Ref Range Status   SARS Coronavirus 2 by RT PCR NEGATIVE NEGATIVE Final    Comment: (NOTE) SARS-CoV-2 target nucleic acids are NOT DETECTED.  The SARS-CoV-2 RNA is generally detectable in upper respiratory specimens during the acute phase of infection. The lowest concentration of SARS-CoV-2 viral copies this assay can detect is 138 copies/mL. A negative result does not preclude SARS-Cov-2 infection and should not be used as the sole basis for treatment or other patient management decisions. A negative result may occur with  improper specimen collection/handling, submission of specimen other than nasopharyngeal swab, presence of viral  mutation(s) within the areas targeted by this assay, and inadequate number of viral copies(<138 copies/mL). A negative result must be combined with clinical observations, patient history, and epidemiological information. The expected result is Negative.  Fact Sheet for Patients:  01/13/21  Fact Sheet for Healthcare Providers:  BloggerCourse.com  This test is no t yet approved or cleared by the [place] FDA and  has been authorized for detection and/or diagnosis of SARS-CoV-2 by FDA under an Emergency Use Authorization (EUA). This EUA will remain  in effect (meaning this test can be used) for the duration of the COVID-19 declaration under Section 564(b)(1) of the Act, 21 U.S.C.section 360bbb-3(b)(1), unless the  authorization is terminated  or revoked sooner.       Influenza A by PCR NEGATIVE NEGATIVE Final   Influenza B by PCR NEGATIVE NEGATIVE Final    Comment: (NOTE) The Xpert Xpress SARS-CoV-2/FLU/RSV plus assay is intended as an aid in the diagnosis of influenza from Nasopharyngeal swab specimens and should not be used as a sole basis for treatment. Nasal washings and aspirates are unacceptable for Xpert Xpress SARS-CoV-2/FLU/RSV testing.  Fact Sheet for Patients: BloggerCourse.com  Fact Sheet for Healthcare Providers: SeriousBroker.it  This test is not yet approved or cleared by the [place] FDA and has been authorized for detection and/or diagnosis of SARS-CoV-2 by FDA under an Emergency Use Authorization (EUA). This EUA will remain in effect (meaning this test can be used) for the duration of the COVID-19 declaration under Section 564(b)(1) of the Act, 21 U.S.C. section 360bbb-3(b)(1), unless the authorization is terminated or revoked.  Performed at [facility], [address] [place], [place] [address]   Urine Culture     Status: None    Collection Time: 01/12/21  7:47 AM   Specimen: In/Out Cath Urine  Result Value Ref Range Status   Specimen Description   Final    IN/OUT CATH URINE Performed at [facility], [address], [place], [place] [address]    Special Requests   Final    NONE Performed at [facility], [address], [place], [place] [address]    Culture   Final    NO GROWTH Performed at [facility], [address], [place], [place] [address]    Report Status 01/13/2021 FINAL  Final         Radiology Studies: DG Tibia/Fibula Right  Result Date: 01/11/2021 CLINICAL DATA:  Gangrenous foot. EXAM: RIGHT TIBIA AND FIBULA - 2 VIEW COMPARISON:  None. FINDINGS: Mild degenerative changes demonstrated in the right knee with moderate degenerative changes in the right ankle. No evidence of acute fracture or dislocation of the tibia or fibula. No cortical erosion or bone destruction. No focal bone lesions. No significant effusion in the knee. Surgical clips in the soft tissues. Prominent vascular calcifications. No radiopaque soft tissue foreign body or soft tissue gas identified. IMPRESSION: No acute bony abnormalities. Degenerative changes in the knee and ankle joints. Prominent vascular calcifications. Electronically Signed   By: Burman Nieves M.D.   On: 01/11/2021 19:28   DG Chest Port 1 View  Result Date: 01/11/2021 CLINICAL DATA:  Sepsis EXAM: PORTABLE CHEST 1 VIEW COMPARISON:  10/09/2020 FINDINGS: The heart size and mediastinal contours are within normal limits. Elevation of the left hemidiaphragm with associated atelectasis in the left lung base. No pleural effusion or pneumothorax. No focal pulmonary opacity. No acute osseous abnormality. IMPRESSION: No active disease. Electronically Signed   By: Wiliam Ke M.D.   On: 01/11/2021 19:23   DG Foot Complete Right  Result Date: 01/11/2021 CLINICAL DATA:  Gangrenous foot. EXAM: RIGHT FOOT COMPLETE - 3+ VIEW COMPARISON:   12/18/2020 FINDINGS: Diffuse bone demineralization. Severe soft tissue ulcerations involving the toes and with a large ulceration over the posterior calcaneus. No radiopaque soft tissue foreign bodies or soft tissue gas collections. Mild soft tissue swelling over the dorsum of the foot. 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 There is progression of these changes  since the previous study. Changes are consistent with multiple sites of osteomyelitis. Degenerative changes and sclerosis in the hindfoot and intertarsal joints suggest developing Charcot joint. Prominent vascular calcifications. IMPRESSION: Progressive changes of soft tissue ulceration and osteomyelitis at multiple sites as discussed above. Electronically Signed   By: Burman Nieves M.D.   On: 01/11/2021 19:27        Scheduled Meds:  aspirin EC  81 mg Oral Daily   atorvastatin  80 mg Oral Daily   insulin aspart  0-9 Units Subcutaneous TID WC   metoprolol succinate  25 mg Oral Daily   tamsulosin  0.4 mg Oral Daily   Continuous Infusions:  ceFEPime (MAXIPIME) IV 2 g (01/12/21 2352)   vancomycin 1,250 mg (01/13/21 0027)     LOS: 2 days    Time spent: 40 min    Silvano Bilis, MD [facility]   If 7PM-7AM, please contact night-coverage [email or web address] Password [facility] 01/13/2021, 9:36 AM

## 2021-01-13 NOTE — Plan of Care (Signed)

## 2021-01-14 ENCOUNTER — Encounter
Admission: EM | Disposition: A | Discharge status: Skilled Nursing Facility | Payer: Self-pay | Source: Home / Self Care | Attending: Hospitalist

## 2021-01-14 ENCOUNTER — Encounter: Payer: Self-pay | Admitting: Registered Nurse

## 2021-01-14 ENCOUNTER — Other Ambulatory Visit (INDEPENDENT_AMBULATORY_CARE_PROVIDER_SITE_OTHER): Payer: Self-pay | Admitting: Vascular Surgery

## 2021-01-14 DIAGNOSIS — M86171 Other acute osteomyelitis, right ankle and foot: Secondary | ICD-10-CM

## 2021-01-14 DIAGNOSIS — E1152 Type 2 diabetes mellitus with diabetic peripheral angiopathy with gangrene: Secondary | ICD-10-CM | POA: Diagnosis not present

## 2021-01-14 DIAGNOSIS — I96 Gangrene, not elsewhere classified: Secondary | ICD-10-CM

## 2021-01-14 DIAGNOSIS — F17200 Nicotine dependence, unspecified, uncomplicated: Secondary | ICD-10-CM

## 2021-01-14 DIAGNOSIS — E785 Hyperlipidemia, unspecified: Secondary | ICD-10-CM | POA: Diagnosis not present

## 2021-01-14 DIAGNOSIS — Z5329 Procedure and treatment not carried out because of patient's decision for other reasons: Secondary | ICD-10-CM

## 2021-01-14 DIAGNOSIS — I70261 Atherosclerosis of native arteries of extremities with gangrene, right leg: Secondary | ICD-10-CM | POA: Diagnosis not present

## 2021-01-14 LAB — BASIC METABOLIC PANEL
Anion gap: 7 (ref 5–15)
BUN: 9 mg/dL (ref 8–23)
CO2: 22 mmol/L (ref 22–32)
Calcium: 7.6 mg/dL — ABNORMAL LOW (ref 8.9–10.3)
Chloride: 107 mmol/L (ref 98–111)
Creatinine, Ser: 0.68 mg/dL (ref 0.61–1.24)
GFR, Estimated: >60 mL/min (ref >60)
Glucose, Bld: 72 mg/dL (ref 70–99)
Potassium: 3.1 mmol/L — ABNORMAL LOW (ref 3.5–5.1)
Sodium: 136 mmol/L (ref 135–145)

## 2021-01-14 LAB — CBC
HCT: 24.2 % — ABNORMAL LOW (ref 39.0–52.0)
Hemoglobin: 7.7 g/dL — ABNORMAL LOW (ref 13.0–17.0)
MCH: 22.6 pg — ABNORMAL LOW (ref 26.0–34.0)
MCHC: 31.8 g/dL (ref 30.0–36.0)
MCV: 71 fL — ABNORMAL LOW (ref 80.0–100.0)
Platelets: 431 10*3/uL — ABNORMAL HIGH (ref 150–400)
RBC: 3.41 MIL/uL — ABNORMAL LOW (ref 4.22–5.81)
RDW: 21 % — ABNORMAL HIGH (ref 11.5–15.5)
WBC: 11.1 10*3/uL — ABNORMAL HIGH (ref 4.0–10.5)
nRBC: 0 % (ref 0.0–0.2)

## 2021-01-14 LAB — GLUCOSE, CAPILLARY
Glucose-Capillary: 79 mg/dL (ref 70–99)
Glucose-Capillary: 69 mg/dL — ABNORMAL LOW (ref 70–99)
Glucose-Capillary: 68 mg/dL — ABNORMAL LOW (ref 70–99)
Glucose-Capillary: 124 mg/dL — ABNORMAL HIGH (ref 70–99)

## 2021-01-14 LAB — VANCOMYCIN, TROUGH: Vancomycin Tr: 19 ug/mL (ref 15–20)

## 2021-01-14 LAB — VANCOMYCIN, PEAK: Vancomycin Pk: 34 ug/mL (ref 30–40)

## 2021-01-14 SURGERY — LOWER EXTREMITY ANGIOGRAPHY
Anesthesia: Moderate Sedation | Laterality: Right

## 2021-01-14 MED ORDER — SODIUM CHLORIDE 0.9 % IV SOLN: INTRAVENOUS | Status: DC

## 2021-01-14 MED ORDER — CEFAZOLIN SODIUM-DEXTROSE 2-4 GM/100ML-% IV SOLN: 2.0000 g | Freq: Once | INTRAVENOUS | Status: DC

## 2021-01-14 MED ORDER — SODIUM CHLORIDE 0.9 % IV BOLUS
1000.0000 mL | Freq: Once | INTRAVENOUS | Status: AC
  Administered 2021-01-14: 1000 mL via INTRAVENOUS

## 2021-01-14 MED ORDER — HYDROMORPHONE HCL 1 MG/ML IJ SOLN: 1.0000 mg | Freq: Once | INTRAMUSCULAR | Status: DC | PRN

## 2021-01-14 MED ORDER — DIPHENHYDRAMINE HCL 50 MG/ML IJ SOLN: 50.0000 mg | Freq: Once | INTRAMUSCULAR | Status: DC | PRN

## 2021-01-14 MED ORDER — FAMOTIDINE 20 MG PO TABS: 40.0000 mg | ORAL_TABLET | Freq: Once | ORAL | Status: DC | PRN

## 2021-01-14 MED ORDER — MIDAZOLAM HCL 2 MG/ML PO SYRP
8.0000 mg | ORAL_SOLUTION | Freq: Once | ORAL | Status: DC | PRN
  Filled 2021-01-14: qty 4

## 2021-01-14 MED ORDER — ONDANSETRON HCL 4 MG/2ML IJ SOLN: 4.0000 mg | Freq: Four times a day (QID) | INTRAMUSCULAR | Status: DC | PRN

## 2021-01-14 MED ORDER — POTASSIUM CHLORIDE CRYS ER 20 MEQ PO TBCR
40.0000 meq | EXTENDED_RELEASE_TABLET | ORAL | Status: AC
  Administered 2021-01-14 (×2): 40 meq via ORAL
  Filled 2021-01-14 (×2): qty 2

## 2021-01-14 MED ORDER — METHYLPREDNISOLONE SODIUM SUCC 125 MG IJ SOLR: 125.0000 mg | Freq: Once | INTRAMUSCULAR | Status: DC | PRN

## 2021-01-14 NOTE — Progress Notes (Signed)
[facility]  Daily Progress Note   Subjective  -   Patient combative and irritated.  Angiogram scheduled for today but he has refused this.  He has been very uncooperative with nurses and staff.  Objective Vitals:   01/13/21 2242 01/14/21 0422 01/14/21 0459 01/14/21 0859  BP: (!) 88/51 104/67 109/76 (!) 89/48  Pulse: (!) 113 (!) 104 (!) 111 (!) 108  Resp: 16 16 16 16  Temp: 99.1 F (37.3 C) 98.6 F (37 C) 98.7 F (37.1 C) 99.2 F (37.3 C)  TempSrc: Oral Oral Oral Oral  SpO2: 97% 98%  97%  Weight:   77.3 kg   Height:        Intake/Output Summary (Last 24 hours) at 01/14/2021 1057 Last data filed at 01/14/2021 0809 Gross per 24 hour  Intake 2205.94 ml  Output 2300 ml  Net -94.06 ml    PULM  CTAB CV  tachycardic VASC  right foot with clearly gangrenous changes including the heel and midfoot.  Fixed skin changes to the lower leg will not allow reasonable healing of the below-knee amputation.  Laboratory CBC    Component Value Date/Time   WBC 11.1 (H) 01/14/2021 0438   HGB 7.7 (L) 01/14/2021 0438   HCT 24.2 (L) 01/14/2021 0438   PLT 431 (H) 01/14/2021 0438    BMET    Component Value Date/Time   NA 136 01/14/2021 0438   K 3.1 (L) 01/14/2021 0438   CL 107 01/14/2021 0438   CO2 22 01/14/2021 0438   GLUCOSE 72 01/14/2021 0438   BUN 9 01/14/2021 0438   CREATININE 0.68 01/14/2021 0438   CALCIUM 7.6 (L) 01/14/2021 0438   GFRNONAA >60 01/14/2021 0438   GFRAA >60 04/14/2019 1345    Assessment/Planning:   Gangrenous changes with irreversible ischemia to the right foot and lower leg. Had an angiogram earlier this year with a reasonable profunda femoris artery, femoral artery. Had planned another angiogram to assure that he had adequate flow for healing of an above-knee amputation but he has refused that.  We will plan on an above-knee amputation tomorrow.  He currently seems agreeable with this although that has waxed and waned while he was  been in the hospital. He is fairly anemic and would agree with type and cross for 2 units of blood as he may need with amputation tomorrow.   Shomari Scicchitano  01/14/2021, 10:57 AM      

## 2021-01-14 NOTE — Progress Notes (Signed)
Yellow MEWS triggered d/t low BP and elevated pulse. Dr. Jamelle Rushing notified, NS Bolus ordered, see orders.

## 2021-01-14 NOTE — H&P (View-Only) (Signed)
[facility]  Daily Progress Note   Subjective  -   Patient combative and irritated.  Angiogram scheduled for today but he has refused this.  He has been very uncooperative with nurses and staff.  Objective Vitals:   01/13/21 2242 01/14/21 0422 01/14/21 0459 01/14/21 0859  BP: (!) 88/51 104/67 109/76 (!) 89/48  Pulse: (!) 113 (!) 104 (!) 111 (!) 108  Resp: 16 16 16 16   Temp: 99.1 F (37.3 C) 98.6 F (37 C) 98.7 F (37.1 C) 99.2 F (37.3 C)  TempSrc: Oral Oral Oral Oral  SpO2: 97% 98%  97%  Weight:   77.3 kg   Height:        Intake/Output Summary (Last 24 hours) at 01/14/2021 1057 Last data filed at 01/14/2021 0809 Gross per 24 hour  Intake 2205.94 ml  Output 2300 ml  Net -94.06 ml    PULM  CTAB CV  tachycardic VASC  right foot with clearly gangrenous changes including the heel and midfoot.  Fixed skin changes to the lower leg will not allow reasonable healing of the below-knee amputation.  Laboratory CBC    Component Value Date/Time   WBC 11.1 (H) 01/14/2021 0438   HGB 7.7 (L) 01/14/2021 0438   HCT 24.2 (L) 01/14/2021 0438   PLT 431 (H) 01/14/2021 0438    BMET    Component Value Date/Time   NA 136 01/14/2021 0438   K 3.1 (L) 01/14/2021 0438   CL 107 01/14/2021 0438   CO2 22 01/14/2021 0438   GLUCOSE 72 01/14/2021 0438   BUN 9 01/14/2021 0438   CREATININE 0.68 01/14/2021 0438   CALCIUM 7.6 (L) 01/14/2021 0438   GFRNONAA >60 01/14/2021 0438   GFRAA >60 04/14/2019 1345    Assessment/Planning:   Gangrenous changes with irreversible ischemia to the right foot and lower leg. Had an angiogram earlier this year with a reasonable profunda femoris artery, femoral artery. Had planned another angiogram to assure that he had adequate flow for healing of an above-knee amputation but he has refused that.  We will plan on an above-knee amputation tomorrow.  He currently seems agreeable with this although that has waxed and waned while he was  been in the hospital. He is fairly anemic and would agree with type and cross for 2 units of blood as he may need with amputation tomorrow.   04/16/2019  01/14/2021, 10:57 AM

## 2021-01-14 NOTE — Progress Notes (Signed)
Attempted to bring pt down to specials for procedure, pt refused transport. Attempted to reason with pt, pt continued to refuse. MD made aware.

## 2021-01-14 NOTE — Progress Notes (Signed)
PROGRESS NOTE  Brad Frost    DOB: [birth year], 65 y.o.  IRJ:[id number]  PCP: [name], [name], MD   Code Status: Full Code   DOA: 01/11/2021   LOS: 3  Brief Narrative of Current Hospitalization  Brad Frost is a 65 y.o. male with a PMH significant for DM, history of osteomyelitis, anemia, tobacco use. They presented from home to the ED on 01/11/2021 with right foot pain x several weeks. In the ED, it was found that they had osteomyelitis. They were treated with IV antibiotics and vascular surgery was consulted.  Patient was admitted to medicine service for further workup and management of osteomyelitis as outlined in detail below.  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 Bx Cx NG x3 days.  Afebrile.  Patient denies pain. - deescalate antibiotics when able - follow up vascular, probable angiogram today, possible surgery for AKA tomorrow  Anemia of chronic disease-hemoglobin 7.7 today.  Patient has severe PAD so with places transfusion threshold of 8. -Receiving transfusion today which will hopefully also improve his blood pressure and tachycardia  T2DM- diet controlled.  Blood sugars have been on lower side of normal and patient is asymptomatic. -Discontinued sliding scale insulin.  CBGs with labs.  History of PE - continue xarelto  Tobacco dependence -Provide counseling for wound healing  BPH-Home medications include Flomax -Flomax has been held since admission due to hypotension.  Continue to hold  DVT prophylaxis: SCDs Start: 01/11/21 2110   Diet:  Diet Orders (From admission, onward)     Start     Ordered   01/14/21 0001  Diet  NPO time specified  Diet effective midnight        01/13/21 0943            Subjective 01/14/21    Pt reports he is doing well.  When I described the procedure of angiogram to him he is agreeable to this procedure.  He has no questions or concerns at this time.  Disposition Plan & Communication  Status is: Inpatient  Remains inpatient appropriate because:Ongoing diagnostic testing needed not appropriate for outpatient work up  Dispo: The patient is from: Home              Anticipated d/c is to: SNF              Patient currently is not medically stable to d/c.   Difficult to place patient No        Family Communication: NA   Consults, Procedures, Significant Events  Consultants:  Vascular surgery  Procedures/significant events:  none  Antimicrobials:  Anti-infectives (From admission, onward)    Start     Dose/Rate Route Frequency Ordered Stop   01/13/21 1700  metroNIDAZOLE (FLAGYL) IVPB 500 mg        500 mg 100 mL/hr over 60 Minutes Intravenous Every 8 hours 01/13/21 1614     01/12/21 1100  vancomycin (VANCOREADY) IVPB 1250 mg/250 mL        1,250 mg 166.7 mL/hr over 90 Minutes Intravenous Every 12 hours 01/11/21 2146     01/12/21 0530  ceFEPIme (MAXIPIME) 2 g in sodium  chloride 0.9 % 100 mL IVPB        2 g 200 mL/hr over 30 Minutes Intravenous Every 8 hours 01/11/21 2146     01/11/21 2145  vancomycin (VANCOREADY) IVPB 750 mg/150 mL        750 mg 150 mL/hr over 60 Minutes Intravenous  Once 01/11/21 2141 01/12/21 0107   01/11/21 2045  ceFEPIme (MAXIPIME) 2 g in sodium chloride 0.9 % 100 mL IVPB        2 g 200 mL/hr over 30 Minutes Intravenous  Once 01/11/21 2036 01/11/21 2204   01/11/21 2045  vancomycin (VANCOCIN) IVPB 1000 mg/200 mL premix        1,000 mg 200 mL/hr over 60 Minutes Intravenous  Once 01/11/21 2036 01/11/21 2346        Objective   Vitals:   01/13/21 1824 01/13/21 2242 01/14/21 0422 01/14/21 0459  BP: 99/72 (!) 88/51 104/67   Pulse: (!)  120 (!) 113 (!) 104   Resp: 16 16 16    Temp:  99.1 F (37.3 C) 98.6 F (37 C)   TempSrc:  Oral Oral   SpO2: 96% 97% 98%   Weight:    77.3 kg  Height:        Intake/Output Summary (Last 24 hours) at 01/14/2021 0752 Last data filed at 01/14/2021 0300 Gross per 24 hour  Intake 2297.58 ml  Output 2850 ml  Net -552.42 ml   Filed Weights   01/11/21 1826 01/14/21 0459  Weight: 78 kg 77.3 kg    Patient BMI: Body mass index is 23.77 kg/m.   Physical Exam: General: awake, alert, NAD HEENT: atraumatic, clear conjunctiva, anicteric sclera, moist mucus membranes, hearing grossly normal Respiratory: CTAB, no wheezes, rales or rhonchi, normal respiratory effort. Cardiovascular: normal S1/S2,  RRR, Nervous: A&O x2. no gross focal neurologic deficits, normal speech Extremities: moves all equally, no edema, normal tone. R LE with erythema, tenderness to palpation. Pedal pulse weak. Sensation diminished Skin: dry, intact, normal temperature, normal color centrally Psychiatry: cooperative but agitated  Labs   I have personally reviewed following labs and imaging studies Admission on 01/11/2021  Component Date Value Ref Range Status   Lactic Acid, Venous 01/11/2021 1.6  0.5 - 1.9 mmol/L Final   Lactic Acid, Venous 01/11/2021 1.6  0.5 - 1.9 mmol/L Final   Sodium 01/11/2021 138  135 - 145 mmol/L Final   Potassium 01/11/2021 4.6  3.5 - 5.1 mmol/L Final   Chloride 01/11/2021 107  98 - 111 mmol/L Final   CO2 01/11/2021 27  22 - 32 mmol/L Final   Glucose, Bld 01/11/2021 99  70 - 99 mg/dL Final   BUN 01/13/2021 13  8 - 23 mg/dL Final   Creatinine, Ser 01/11/2021 0.73  0.61 - 1.24 mg/dL Final   Calcium 01/13/2021 8.4 (A) 8.9 - 10.3 mg/dL Final   Total Protein 62/13/0865 6.3 (A) 6.5 - 8.1 g/dL Final   Albumin 78/46/9629 2.2 (A) 3.5 - 5.0 g/dL Final   AST 52/84/1324 24  15 - 41 U/L Final   ALT 01/11/2021 27  0 - 44 U/L Final   Alkaline Phosphatase 01/11/2021 87  38 - 126 U/L Final   Total  Bilirubin 01/11/2021 0.5  0.3 - 1.2 mg/dL Final   GFR, Estimated 01/11/2021 >60  >60 mL/min Final   Anion gap 01/11/2021 4 (A) 5 - 15 Final   WBC 01/11/2021 11.0 (A) 4.0 - 10.5 K/uL Final   RBC 01/11/2021 4.00 (A) 4.22 - 5.81 MIL/uL  Final   Hemoglobin 01/11/2021 9.1 (A) 13.0 - 17.0 g/dL Final   HCT 51/70/0174 29.5 (A) 39.0 - 52.0 % Final   MCV 01/11/2021 73.8 (A) 80.0 - 100.0 fL Final   MCH 01/11/2021 22.8 (A) 26.0 - 34.0 pg Final   MCHC 01/11/2021 30.8  30.0 - 36.0 g/dL Final   RDW 94/49/6759 21.2 (A) 11.5 - 15.5 % Final   Platelets 01/11/2021 476 (A) 150 - 400 K/uL Final   nRBC 01/11/2021 0.0  0.0 - 0.2 % Final   Neutrophils Relative % 01/11/2021 71  % Final   Neutro Abs 01/11/2021 7.7  1.7 - 7.7 K/uL Final   Lymphocytes Relative 01/11/2021 16  % Final   Lymphs Abs 01/11/2021 1.7  0.7 - 4.0 K/uL Final   Monocytes Relative 01/11/2021 10  % Final   Monocytes Absolute 01/11/2021 1.2 (A) 0.1 - 1.0 K/uL Final   Eosinophils Relative 01/11/2021 3  % Final   Eosinophils Absolute 01/11/2021 0.4  0.0 - 0.5 K/uL Final   Basophils Relative 01/11/2021 0  % Final   Basophils Absolute 01/11/2021 0.0  0.0 - 0.1 K/uL Final   WBC Morphology 01/11/2021 MORPHOLOGY UNREMARKABLE   Final   Smear Review 01/11/2021 Normal platelet morphology   Final   Immature Granulocytes 01/11/2021 0  % Final   Abs Immature Granulocytes 01/11/2021 0.04  0.00 - 0.07 K/uL Final   Burr Cells 01/11/2021 PRESENT   Final   Target Cells 01/11/2021 PRESENT   Final   Prothrombin Time 01/11/2021 14.4  11.4 - 15.2 seconds Final   INR 01/11/2021 1.1  0.8 - 1.2 Final   aPTT 01/11/2021 29  24 - 36 seconds Final   Specimen Description 01/11/2021 BLOOD LEFT ANTECUBITAL   Final   Special Requests 01/11/2021 BOTTLES DRAWN AEROBIC AND ANAEROBIC Blood Culture adequate volume   Final   Culture 01/11/2021    Final                   Value:NO GROWTH 3 DAYS Performed at [facility], [address] [place], [place] [address]     Report Status 01/11/2021 PENDING   Incomplete   Color, Urine 01/12/2021 YELLOW  YELLOW Final   APPearance 01/12/2021 CLEAR  CLEAR Final   Specific Gravity, Urine 01/12/2021 1.020  1.005 - 1.030 Final   pH 01/12/2021 5.5  5.0 - 8.0 Final   Glucose, UA 01/12/2021 NEGATIVE  NEGATIVE mg/dL Final   Hgb urine dipstick 01/12/2021 NEGATIVE  NEGATIVE Final   Bilirubin Urine 01/12/2021 NEGATIVE  NEGATIVE Final   Ketones, ur 01/12/2021 NEGATIVE  NEGATIVE mg/dL Final   Protein, ur 66/59/9357 NEGATIVE  NEGATIVE mg/dL Final   Nitrite 01/77/9390 NEGATIVE  NEGATIVE Final   Leukocytes,Ua 01/12/2021 NEGATIVE  NEGATIVE Final   Squamous Epithelial / LPF 01/12/2021 0-5  0 - 5 Final   WBC, UA 01/12/2021 0-5  0 - 5 WBC/hpf Final   RBC / HPF 01/12/2021 0-5  0 - 5 RBC/hpf Final   Bacteria, UA 01/12/2021 NONE SEEN  NONE SEEN Final   Mucus 01/12/2021 PRESENT   Final   Specimen Description 01/12/2021    Final                   Value:IN/OUT CATH URINE Performed at [facility], [address] [place], [place] [address]    Special Requests 01/12/2021    Final  Value:NONE Performed at [facility], [address], [place], [place] [address]    Culture 01/12/2021    Final                   Value:NO GROWTH Performed at [facility], [address] [place], [place] [address]    Report Status 01/12/2021 01/13/2021 FINAL   Final   SARS Coronavirus 2 by RT PCR 01/11/2021 NEGATIVE  NEGATIVE Final   Influenza A by PCR 01/11/2021 NEGATIVE  NEGATIVE Final   Influenza B by PCR 01/11/2021 NEGATIVE  NEGATIVE Final   Glucose-Capillary 01/11/2021 78  70 - 99 mg/dL Final   Magnesium 29/52/8413 1.7  1.7 - 2.4 mg/dL Final   Magnesium 24/40/1027 2.1  1.7 - 2.4 mg/dL Final   Sodium 25/36/6440 137  135 - 145 mmol/L Final   Potassium 01/12/2021 3.8  3.5 - 5.1 mmol/L Final   Chloride 01/12/2021 106  98 - 111 mmol/L Final   CO2 01/12/2021 23  22 - 32 mmol/L Final   Glucose, Bld  01/12/2021 89  70 - 99 mg/dL Final   BUN 34/74/2595 9  8 - 23 mg/dL Final   Creatinine, Ser 01/12/2021 0.62  0.61 - 1.24 mg/dL Final   Calcium 63/87/5643 7.9 (A) 8.9 - 10.3 mg/dL Final   Total Protein 32/95/1884 5.2 (A) 6.5 - 8.1 g/dL Final   Albumin 16/60/6301 1.8 (A) 3.5 - 5.0 g/dL Final   AST 60/01/9322 19  15 - 41 U/L Final   ALT 01/12/2021 20  0 - 44 U/L Final   Alkaline Phosphatase 01/12/2021 68  38 - 126 U/L Final   Total Bilirubin 01/12/2021 0.7  0.3 - 1.2 mg/dL Final   GFR, Estimated 01/12/2021 >60  >60 mL/min Final   Anion gap 01/12/2021 8  5 - 15 Final   WBC 01/12/2021 10.4  4.0 - 10.5 K/uL Final   RBC 01/12/2021 3.63 (A) 4.22 - 5.81 MIL/uL Final   Hemoglobin 01/12/2021 8.0 (A) 13.0 - 17.0 g/dL Final   HCT 55/73/2202 25.8 (A) 39.0 - 52.0 % Final   MCV 01/12/2021 71.1 (A) 80.0 - 100.0 fL Final   MCH 01/12/2021 22.0 (A) 26.0 - 34.0 pg Final   MCHC 01/12/2021 31.0  30.0 - 36.0 g/dL Final   RDW 54/27/0623 21.2 (A) 11.5 - 15.5 % Final   Platelets 01/12/2021 422 (A) 150 - 400 K/uL Final   nRBC 01/12/2021 0.0  0.0 - 0.2 % Final   Neutrophils Relative % 01/12/2021 71  % Final   Neutro Abs 01/12/2021 7.4  1.7 - 7.7 K/uL Final   Lymphocytes Relative 01/12/2021 15  % Final   Lymphs Abs 01/12/2021 1.6  0.7 - 4.0 K/uL Final   Monocytes Relative 01/12/2021 12  % Final   Monocytes Absolute 01/12/2021 1.2 (A) 0.1 - 1.0 K/uL Final   Eosinophils Relative 01/12/2021 2  % Final   Eosinophils Absolute 01/12/2021 0.2  0.0 - 0.5 K/uL Final   Basophils Relative 01/12/2021 0  % Final   Basophils Absolute 01/12/2021 0.0  0.0 - 0.1 K/uL Final   WBC Morphology 01/12/2021 MORPHOLOGY UNREMARKABLE   Final   RBC Morphology 01/12/2021 MIXED RBC MORPHOLOGY.   Final   Smear Review 01/12/2021 Normal platelet morphology   Final   Immature Granulocytes 01/12/2021 0  % Final   Abs Immature Granulocytes 01/12/2021 0.03  0.00 - 0.07 K/uL Final   Sed Rate 01/12/2021 64 (A) 0 - 20 mm/hr Final   CRP  01/12/2021  6.7 (A) <1.0 mg/dL Final   Prothrombin Time 01/12/2021 14.3  11.4 - 15.2 seconds Final   INR 01/12/2021 1.1  0.8 - 1.2 Final   Glucose-Capillary 01/11/2021 112 (A) 70 - 99 mg/dL Final   Glucose-Capillary 01/12/2021 93  70 - 99 mg/dL Final   Sodium 16/01/9603 134 (A) 135 - 145 mmol/L Final   Potassium 01/13/2021 3.4 (A) 3.5 - 5.1 mmol/L Final   Chloride 01/13/2021 106  98 - 111 mmol/L Final   CO2 01/13/2021 22  22 - 32 mmol/L Final   Glucose, Bld 01/13/2021 84  70 - 99 mg/dL Final   BUN 54/12/8117 10  8 - 23 mg/dL Final   Creatinine, Ser 01/13/2021 0.74  0.61 - 1.24 mg/dL Final   Calcium 14/78/2956 7.4 (A) 8.9 - 10.3 mg/dL Final   GFR, Estimated 01/13/2021 >60  >60 mL/min Final   Anion gap 01/13/2021 6  5 - 15 Final   WBC 01/13/2021 11.9 (A) 4.0 - 10.5 K/uL Final   RBC 01/13/2021 3.41 (A) 4.22 - 5.81 MIL/uL Final   Hemoglobin 01/13/2021 7.8 (A) 13.0 - 17.0 g/dL Final   HCT 21/30/8657 24.0 (A) 39.0 - 52.0 % Final   MCV 01/13/2021 70.4 (A) 80.0 - 100.0 fL Final   MCH 01/13/2021 22.9 (A) 26.0 - 34.0 pg Final   MCHC 01/13/2021 32.5  30.0 - 36.0 g/dL Final   RDW 84/69/6295 20.7 (A) 11.5 - 15.5 % Final   Platelets 01/13/2021 400  150 - 400 K/uL Final   nRBC 01/13/2021 0.0  0.0 - 0.2 % Final   Glucose-Capillary 01/13/2021 66 (A) 70 - 99 mg/dL Final   Glucose-Capillary 01/13/2021 90  70 - 99 mg/dL Final   Sodium 28/41/3244 136  135 - 145 mmol/L Final   Potassium 01/14/2021 3.1 (A) 3.5 - 5.1 mmol/L Final   Chloride 01/14/2021 107  98 - 111 mmol/L Final   CO2 01/14/2021 22  22 - 32 mmol/L Final   Glucose, Bld 01/14/2021 72  70 - 99 mg/dL Final   BUN 04/28/7251 9  8 - 23 mg/dL Final   Creatinine, Ser 01/14/2021 0.68  0.61 - 1.24 mg/dL Final   Calcium 66/44/0347 7.6 (A) 8.9 - 10.3 mg/dL Final   GFR, Estimated 01/14/2021 >60  >60 mL/min Final   Anion gap 01/14/2021 7  5 - 15 Final   WBC 01/14/2021 11.1 (A) 4.0 - 10.5 K/uL Final   RBC 01/14/2021 3.41 (A) 4.22 - 5.81 MIL/uL Final   Hemoglobin  01/14/2021 7.7 (A) 13.0 - 17.0 g/dL Final   HCT 42/59/5638 24.2 (A) 39.0 - 52.0 % Final   MCV 01/14/2021 71.0 (A) 80.0 - 100.0 fL Final   MCH 01/14/2021 22.6 (A) 26.0 - 34.0 pg Final   MCHC 01/14/2021 31.8  30.0 - 36.0 g/dL Final   RDW 75/64/3329 21.0 (A) 11.5 - 15.5 % Final   Platelets 01/14/2021 431 (A) 150 - 400 K/uL Final   nRBC 01/14/2021 0.0  0.0 - 0.2 % Final   Lactic Acid, Venous 01/13/2021 0.8  0.5 - 1.9 mmol/L Final   Sodium 01/13/2021 134 (A) 135 - 145 mmol/L Final   Potassium 01/13/2021 3.4 (A) 3.5 - 5.1 mmol/L Final   Chloride 01/13/2021 106  98 - 111 mmol/L Final   CO2 01/13/2021 22  22 - 32 mmol/L Final   Glucose, Bld 01/13/2021 82  70 - 99 mg/dL Final   BUN 51/88/4166 9  8 - 23 mg/dL Final   Creatinine, Ser  01/13/2021 0.74  0.61 - 1.24 mg/dL Final   Calcium 32/20/2542 7.5 (A) 8.9 - 10.3 mg/dL Final   GFR, Estimated 01/13/2021 >60  >60 mL/min Final   Anion gap 01/13/2021 6  5 - 15 Final   Glucose-Capillary 01/13/2021 68 (A) 70 - 99 mg/dL Final     Recent Results (from the past 240 hour(s))  Blood culture (routine single)     Status: None (Preliminary result)   Collection Time: 01/11/21  7:51 PM   Specimen: BLOOD  Result Value Ref Range Status   Specimen Description BLOOD LEFT ANTECUBITAL  Final   Special Requests   Final    BOTTLES DRAWN AEROBIC AND ANAEROBIC Blood Culture adequate volume   Culture   Final    NO GROWTH 3 DAYS Performed at [facility], [address], [place], [place] [address]    Report Status PENDING  Incomplete  Resp Panel by RT-PCR (Flu A&B, Covid) Nasopharyngeal Swab     Status: None   Collection Time: 01/11/21  9:35 PM   Specimen: Nasopharyngeal Swab; Nasopharyngeal(NP) swabs in vial transport medium  Result Value Ref Range Status   SARS Coronavirus 2 by RT PCR NEGATIVE NEGATIVE Final    Comment: (NOTE) SARS-CoV-2 target nucleic acids are NOT DETECTED.  The SARS-CoV-2 RNA is generally detectable in upper respiratory specimens  during the acute phase of infection. The lowest concentration of SARS-CoV-2 viral copies this assay can detect is 138 copies/mL. A negative result does not preclude SARS-Cov-2 infection and should not be used as the sole basis for treatment or other patient management decisions. A negative result may occur with  improper specimen collection/handling, submission of specimen other than nasopharyngeal swab, presence of viral mutation(s) within the areas targeted by this assay, and inadequate number of viral copies(<138 copies/mL). A negative result must be combined with clinical observations, patient history, and epidemiological information. The expected result is Negative.  Fact Sheet for Patients:  BloggerCourse.com  Fact Sheet for Healthcare Providers:  SeriousBroker.it  This test is no t yet approved or cleared by the [place] FDA and  has been authorized for detection and/or diagnosis of SARS-CoV-2 by FDA under an Emergency Use Authorization (EUA). This EUA will remain  in effect (meaning this test can be used) for the duration of the COVID-19 declaration under Section 564(b)(1) of the Act, 21 U.S.C.section 360bbb-3(b)(1), unless the authorization is terminated  or revoked sooner.       Influenza A by PCR NEGATIVE NEGATIVE Final   Influenza B by PCR NEGATIVE NEGATIVE Final    Comment: (NOTE) The Xpert Xpress SARS-CoV-2/FLU/RSV plus assay is intended as an aid in the diagnosis of influenza from Nasopharyngeal swab specimens and should not be used as a sole basis for treatment. Nasal washings and aspirates are unacceptable for Xpert Xpress SARS-CoV-2/FLU/RSV testing.  Fact Sheet for Patients: BloggerCourse.com  Fact Sheet for Healthcare Providers: SeriousBroker.it  This test is not yet approved or cleared by the [place] FDA and has been authorized for detection  and/or diagnosis of SARS-CoV-2 by FDA under an Emergency Use Authorization (EUA). This EUA will remain in effect (meaning this test can be used) for the duration of the COVID-19 declaration under Section 564(b)(1) of the Act, 21 U.S.C. section 360bbb-3(b)(1), unless the authorization is terminated or revoked.  Performed at [facility], [address], [place], [place] [address]   Urine Culture     Status: None   Collection Time: 01/12/21  7:47 AM   Specimen:  In/Out Cath Urine  Result Value Ref Range Status   Specimen Description   Final    IN/OUT CATH URINE Performed at [facility], [address], [place], [place] [address]    Special Requests   Final    NONE Performed at [facility], [address] [place], [place] [address]    Culture   Final    NO GROWTH Performed at [facility], [address], [place], [place] [address]    Report Status 01/13/2021 FINAL  Final     Imaging Studies  No results found. Medications   Scheduled Meds:  aspirin EC  81 mg Oral Daily   atorvastatin  80 mg Oral Daily   insulin aspart  0-9 Units Subcutaneous TID WC   Continuous Infusions:  ceFEPime (MAXIPIME) IV 2 g (01/14/21 0536)   lactated ringers 125 mL/hr at 01/13/21 1614   metronidazole 500 mg (01/14/21 0258)   vancomycin 1,250 mg (01/13/21 2356)     LOS: 3 days   Time spent: >9min   Leeroy Bock, DO [facility] 01/14/2021, 7:52 AM   To contact the [facility] Attending or Consulting provider for this patient: Check the care team in [facility] for a) attending/consulting [facility] provider listed and b) the [facility] team listed Log into [email or web address] and use [facility] universal password to access. If you do not have the password, please contact the hospital operator. Locate the [facility] provider you are looking for under [facility] and page to a number that you can be directly reached. If you still have difficulty reaching the provider,  please page the [facility] (Director on Call) for the Hospitalists listed on amion for assistance.

## 2021-01-15 ENCOUNTER — Inpatient Hospital Stay: Payer: No Typology Code available for payment source | Admitting: Anesthesiology

## 2021-01-15 ENCOUNTER — Encounter
Admission: EM | Disposition: A | Discharge status: Skilled Nursing Facility | Payer: Self-pay | Source: Home / Self Care | Attending: Hospitalist

## 2021-01-15 ENCOUNTER — Encounter: Payer: Self-pay | Admitting: Internal Medicine

## 2021-01-15 DIAGNOSIS — E1140 Type 2 diabetes mellitus with diabetic neuropathy, unspecified: Secondary | ICD-10-CM

## 2021-01-15 DIAGNOSIS — Z720 Tobacco use: Secondary | ICD-10-CM

## 2021-01-15 DIAGNOSIS — I96 Gangrene, not elsewhere classified: Secondary | ICD-10-CM | POA: Diagnosis not present

## 2021-01-15 HISTORY — PX: AMPUTATION: SHX166

## 2021-01-15 LAB — CBC
HCT: 25.9 % — ABNORMAL LOW (ref 39.0–52.0)
Hemoglobin: 7.9 g/dL — ABNORMAL LOW (ref 13.0–17.0)
MCH: 21.7 pg — ABNORMAL LOW (ref 26.0–34.0)
MCHC: 30.5 g/dL (ref 30.0–36.0)
MCV: 71.2 fL — ABNORMAL LOW (ref 80.0–100.0)
Platelets: 445 10*3/uL — ABNORMAL HIGH (ref 150–400)
RBC: 3.64 MIL/uL — ABNORMAL LOW (ref 4.22–5.81)
RDW: 21 % — ABNORMAL HIGH (ref 11.5–15.5)
WBC: 9.6 10*3/uL (ref 4.0–10.5)
nRBC: 0 % (ref 0.0–0.2)

## 2021-01-15 LAB — GLUCOSE, CAPILLARY
Glucose-Capillary: 94 mg/dL (ref 70–99)
Glucose-Capillary: 100 mg/dL — ABNORMAL HIGH (ref 70–99)
Glucose-Capillary: 113 mg/dL — ABNORMAL HIGH (ref 70–99)
Glucose-Capillary: 266 mg/dL — ABNORMAL HIGH (ref 70–99)

## 2021-01-15 LAB — PROTIME-INR
INR: 1.3 — ABNORMAL HIGH (ref 0.8–1.2)
Prothrombin Time: 15.7 seconds — ABNORMAL HIGH (ref 11.4–15.2)

## 2021-01-15 LAB — BASIC METABOLIC PANEL
Anion gap: 5 (ref 5–15)
BUN: 9 mg/dL (ref 8–23)
CO2: 23 mmol/L (ref 22–32)
Calcium: 7.8 mg/dL — ABNORMAL LOW (ref 8.9–10.3)
Chloride: 108 mmol/L (ref 98–111)
Creatinine, Ser: 0.66 mg/dL (ref 0.61–1.24)
GFR, Estimated: >60 mL/min (ref >60)
Glucose, Bld: 77 mg/dL (ref 70–99)
Potassium: 3.9 mmol/L (ref 3.5–5.1)
Sodium: 136 mmol/L (ref 135–145)

## 2021-01-15 LAB — MAGNESIUM: Magnesium: 1.8 mg/dL (ref 1.7–2.4)

## 2021-01-15 LAB — PHOSPHORUS: Phosphorus: 2.8 mg/dL (ref 2.5–4.6)

## 2021-01-15 LAB — APTT: aPTT: 37 seconds — ABNORMAL HIGH (ref 24–36)

## 2021-01-15 SURGERY — AMPUTATION, ABOVE KNEE
Anesthesia: General | Site: Knee | Laterality: Right

## 2021-01-15 MED ORDER — GLYCOPYRROLATE 0.2 MG/ML IJ SOLN
INTRAMUSCULAR | Status: DC | PRN
  Administered 2021-01-15: .2 mg via INTRAVENOUS

## 2021-01-15 MED ORDER — PROPOFOL 10 MG/ML IV BOLUS
INTRAVENOUS | Status: DC | PRN
  Administered 2021-01-15: 150 mg via INTRAVENOUS

## 2021-01-15 MED ORDER — CEFAZOLIN SODIUM-DEXTROSE 2-4 GM/100ML-% IV SOLN
INTRAVENOUS | Status: AC
  Filled 2021-01-15: qty 100

## 2021-01-15 MED ORDER — ACETAMINOPHEN 10 MG/ML IV SOLN
INTRAVENOUS | Status: AC
  Filled 2021-01-15: qty 100

## 2021-01-15 MED ORDER — KETAMINE HCL 50 MG/5ML IJ SOSY
PREFILLED_SYRINGE | INTRAMUSCULAR | Status: AC
  Filled 2021-01-15: qty 5

## 2021-01-15 MED ORDER — FENTANYL CITRATE (PF) 100 MCG/2ML IJ SOLN
INTRAMUSCULAR | Status: AC
  Filled 2021-01-15: qty 2

## 2021-01-15 MED ORDER — HYDROMORPHONE HCL 1 MG/ML IJ SOLN
INTRAMUSCULAR | Status: DC | PRN
  Administered 2021-01-15: .5 mg via INTRAVENOUS

## 2021-01-15 MED ORDER — DEXTROSE-NACL 5-0.9 % IV SOLN: INTRAVENOUS | Status: DC

## 2021-01-15 MED ORDER — CHLORHEXIDINE GLUCONATE CLOTH 2 % EX PADS
6.0000 | MEDICATED_PAD | Freq: Once | CUTANEOUS | Status: AC
  Administered 2021-01-15: 6 via TOPICAL

## 2021-01-15 MED ORDER — ACETAMINOPHEN 10 MG/ML IV SOLN
INTRAVENOUS | Status: DC | PRN
  Administered 2021-01-15: 1000 mg via INTRAVENOUS

## 2021-01-15 MED ORDER — 0.9 % SODIUM CHLORIDE (POUR BTL) OPTIME
TOPICAL | Status: DC | PRN
  Administered 2021-01-15: 700 mL

## 2021-01-15 MED ORDER — VANCOMYCIN HCL IN DEXTROSE 1-5 GM/200ML-% IV SOLN
1000.0000 mg | Freq: Two times a day (BID) | INTRAVENOUS | Status: DC
  Administered 2021-01-15 – 2021-01-16 (×4): 1000 mg via INTRAVENOUS
  Filled 2021-01-15 (×7): qty 200

## 2021-01-15 MED ORDER — HALOPERIDOL LACTATE 5 MG/ML IJ SOLN
2.0000 mg | Freq: Once | INTRAMUSCULAR | Status: AC
  Administered 2021-01-15: 2 mg via INTRAVENOUS
  Filled 2021-01-15: qty 1

## 2021-01-15 MED ORDER — FENTANYL CITRATE (PF) 100 MCG/2ML IJ SOLN
INTRAMUSCULAR | Status: DC | PRN
  Administered 2021-01-15: 50 ug via INTRAVENOUS
  Administered 2021-01-15 (×3): 25 ug via INTRAVENOUS
  Administered 2021-01-15: 50 ug via INTRAVENOUS
  Administered 2021-01-15: 25 ug via INTRAVENOUS

## 2021-01-15 MED ORDER — CEFAZOLIN SODIUM-DEXTROSE 2-3 GM-%(50ML) IV SOLR
INTRAVENOUS | Status: DC | PRN
  Administered 2021-01-15: 2 g via INTRAVENOUS

## 2021-01-15 MED ORDER — FENTANYL CITRATE (PF) 100 MCG/2ML IJ SOLN
INTRAMUSCULAR | Status: AC
  Administered 2021-01-15: 25 ug via INTRAVENOUS
  Filled 2021-01-15: qty 2

## 2021-01-15 MED ORDER — ONDANSETRON HCL 4 MG/2ML IJ SOLN
INTRAMUSCULAR | Status: DC | PRN
  Administered 2021-01-15: 4 mg via INTRAVENOUS

## 2021-01-15 MED ORDER — KETAMINE HCL 10 MG/ML IJ SOLN
INTRAMUSCULAR | Status: DC | PRN
  Administered 2021-01-15 (×3): 10 mg via INTRAVENOUS
  Administered 2021-01-15: 20 mg via INTRAVENOUS

## 2021-01-15 MED ORDER — FENTANYL CITRATE (PF) 100 MCG/2ML IJ SOLN
25.0000 ug | INTRAMUSCULAR | Status: DC | PRN
  Administered 2021-01-15 (×3): 25 ug via INTRAVENOUS

## 2021-01-15 MED ORDER — SODIUM CHLORIDE 0.9 % IV SOLN: INTRAVENOUS | Status: DC | PRN

## 2021-01-15 MED ORDER — PHENYLEPHRINE HCL (PRESSORS) 10 MG/ML IV SOLN
INTRAVENOUS | Status: DC | PRN
  Administered 2021-01-15 (×4): 160 ug via INTRAVENOUS
  Administered 2021-01-15: 200 ug via INTRAVENOUS
  Administered 2021-01-15: 160 ug via INTRAVENOUS

## 2021-01-15 MED ORDER — LIDOCAINE HCL (CARDIAC) PF 100 MG/5ML IV SOSY
PREFILLED_SYRINGE | INTRAVENOUS | Status: DC | PRN
  Administered 2021-01-15: 80 mg via INTRAVENOUS

## 2021-01-15 MED ORDER — VANCOMYCIN HCL IN DEXTROSE 1-5 GM/200ML-% IV SOLN: 1000.0000 mg | INTRAVENOUS | Status: DC

## 2021-01-15 MED ORDER — HYDROMORPHONE HCL 1 MG/ML IJ SOLN
INTRAMUSCULAR | Status: AC
  Filled 2021-01-15: qty 1

## 2021-01-15 MED ORDER — SODIUM CHLORIDE 0.9 % IV SOLN: INTRAVENOUS | Status: DC

## 2021-01-15 MED ORDER — ONDANSETRON HCL 4 MG/2ML IJ SOLN: 4.0000 mg | Freq: Once | INTRAMUSCULAR | Status: DC | PRN

## 2021-01-15 MED ORDER — DEXAMETHASONE SODIUM PHOSPHATE 10 MG/ML IJ SOLN
INTRAMUSCULAR | Status: DC | PRN
Start: 2021-01-15 — End: 2021-01-15
  Administered 2021-01-15: 5 mg via INTRAVENOUS

## 2021-01-15 SURGICAL SUPPLY — 39 items
BNDG COHESIVE 4X5 TAN ST LF (GAUZE/BANDAGES/DRESSINGS) ×2 IMPLANT
BNDG ELASTIC 6X5.8 VLCR NS LF (GAUZE/BANDAGES/DRESSINGS) ×2 IMPLANT
BNDG ELASTIC 6X5.8 VLCR STR LF (GAUZE/BANDAGES/DRESSINGS) ×1 IMPLANT
BNDG GAUZE ELAST 4 BULKY (GAUZE/BANDAGES/DRESSINGS) ×4 IMPLANT
BRUSH SCRUB EZ  4% CHG (MISCELLANEOUS) ×1
BRUSH SCRUB EZ 4% CHG (MISCELLANEOUS) ×1 IMPLANT
CHLORAPREP W/TINT 26 (MISCELLANEOUS) ×2 IMPLANT
DRAPE INCISE IOBAN 66X45 STRL (DRAPES) ×2 IMPLANT
DRAPE INCISE IOBAN 66X60 STRL (DRAPES) ×2 IMPLANT
ELECT CAUTERY BLADE 6.4 (BLADE) ×2 IMPLANT
ELECT REM PT RETURN 9FT ADLT (ELECTROSURGICAL) ×2
ELECTRODE REM PT RTRN 9FT ADLT (ELECTROSURGICAL) ×1 IMPLANT
GAUZE 4X4 16PLY ~~LOC~~+RFID DBL (SPONGE) ×2 IMPLANT
GAUZE XEROFORM 1X8 LF (GAUZE/BANDAGES/DRESSINGS) ×3 IMPLANT
GLOVE SURG ENC MOIS LTX SZ7 (GLOVE) ×2 IMPLANT
GLOVE SURG SYN 7.0 (GLOVE) ×2 IMPLANT
GLOVE SURG SYN 7.0 PF PI (GLOVE) ×1 IMPLANT
GLOVE SURG UNDER LTX SZ7.5 (GLOVE) ×2 IMPLANT
GOWN STRL REUS W/ TWL LRG LVL3 (GOWN DISPOSABLE) ×1 IMPLANT
GOWN STRL REUS W/ TWL XL LVL3 (GOWN DISPOSABLE) ×2 IMPLANT
GOWN STRL REUS W/TWL LRG LVL3 (GOWN DISPOSABLE) ×1
GOWN STRL REUS W/TWL XL LVL3 (GOWN DISPOSABLE) ×2
HANDLE YANKAUER SUCT BULB TIP (MISCELLANEOUS) ×2 IMPLANT
KIT TURNOVER KIT A (KITS) ×2 IMPLANT
LABEL OR SOLS (LABEL) ×2 IMPLANT
MANIFOLD NEPTUNE II (INSTRUMENTS) ×2 IMPLANT
NS IRRIG 1000ML POUR BTL (IV SOLUTION) ×2 IMPLANT
PACK EXTREMITY ARMC (MISCELLANEOUS) ×2 IMPLANT
PAD ABD DERMACEA PRESS 5X9 (GAUZE/BANDAGES/DRESSINGS) ×4 IMPLANT
PAD PREP 24X41 OB/GYN DISP (PERSONAL CARE ITEMS) ×2 IMPLANT
SPONGE T-LAP 18X18 ~~LOC~~+RFID (SPONGE) ×4 IMPLANT
STAPLER SKIN PROX 35W (STAPLE) ×2 IMPLANT
STOCKINETTE M/LG 89821 (MISCELLANEOUS) ×2 IMPLANT
SUT SILK 2 0 (SUTURE) ×1
SUT SILK 2 0 SH (SUTURE) ×4 IMPLANT
SUT SILK 2-0 18XBRD TIE 12 (SUTURE) ×1 IMPLANT
SUT VIC AB 0 CT1 36 (SUTURE) ×4 IMPLANT
SUT VIC AB 2-0 CT1 (SUTURE) ×4 IMPLANT
WATER STERILE IRR 500ML POUR (IV SOLUTION) ×2 IMPLANT

## 2021-01-15 NOTE — [place] FL2 (Signed)
[place] MEDICAID FL2 LEVEL OF CARE SCREENING TOOL     IDENTIFICATION  Patient Name: Brad Frost Birthdate: 01-Aug-1955 Sex: male Admission Date (Current Location): 01/11/2021  [place] and [place] Number:  Chiropodist and Address:  [facility], [address] [place], [place] [address]      Provider Number: [id number]  Attending Physician Name and Address:  Leeroy Bock, MD  Relative Name and Phone Number:  Charlton Haws [phone]    Current Level of Care: Hospital Recommended Level of Care: Skilled Nursing Facility Prior Approval Number:    Date Approved/Denied:   PASRR Number: [id number]  Discharge Plan: SNF    Current Diagnoses: Patient Active Problem List   Diagnosis Date Noted   Sepsis (HCC) 01/12/2021   Tobacco abuse 01/12/2021   Osteomyelitis of right foot (HCC) 01/11/2021   Atherosclerosis of native arteries of extremity with rest pain (HCC) 12/31/2020   Hyperlipidemia 12/31/2020   Gangrene of right foot (HCC) 10/09/2020   Anemia of chronic disease 10/09/2020   Toe ulcer, right (HCC) 09/17/2020   Cellulitis of lower extremity 09/16/2020   COVID-19 04/13/2019   Hypotension    COVID-19 virus infection 04/04/2019   Pneumonia due to COVID-19 virus    Bilateral pulmonary embolism (HCC)    Lung mass    AKI (acute kidney injury) (HCC)    Type 2 diabetes mellitus with diabetic neuropathy, without long-term current use of insulin (HCC)    PVD (peripheral vascular disease) (HCC) 02/14/2015    Orientation RESPIRATION BLADDER Height & Weight     Self, Time, Situation, Place  Normal Continent Weight: 77.2 kg Height:  5\' 11"  (180.3 cm)  BEHAVIORAL SYMPTOMS/MOOD NEUROLOGICAL BOWEL NUTRITION STATUS      Continent Diet (carb modified)  AMBULATORY STATUS COMMUNICATION OF NEEDS Skin   Extensive Assist Verbally Normal, Surgical wounds                       Personal Care Assistance Level of Assistance   Bathing, Dressing Bathing Assistance: Limited assistance   Dressing Assistance: Limited assistance     Functional Limitations Info             SPECIAL CARE FACTORS FREQUENCY                       Contractures Contractures Info: Not present    Additional Factors Info  Code Status, Allergies Code Status Info: Full code Allergies Info: Sildenafil           Current Medications (01/15/2021):  This is the current hospital active medication list Current Facility-Administered Medications  Medication Dose Route Frequency Provider Last Rate Last Admin   acetaminophen (TYLENOL) tablet 650 mg  650 mg Oral Q6H PRN Howerter, Justin B, DO   650 mg at 01/13/21 2353   Or   acetaminophen (TYLENOL) suppository 650 mg  650 mg Rectal Q6H PRN Howerter, Justin B, DO       aspirin EC tablet 81 mg  81 mg Oral Daily 2354, MD   81 mg at 01/13/21 0847   atorvastatin (LIPITOR) tablet 80 mg  80 mg Oral Daily 01/15/21, MD   80 mg at 01/15/21 1113   ceFEPIme (MAXIPIME) 2 g in sodium chloride 0.9 % 100 mL IVPB  2 g Intravenous Q8H Thompson, Amy C, RPH 200 mL/hr at 01/15/21 0548 2 g at 01/15/21 0548   dextrose 5 %-0.9 % sodium chloride infusion  Intravenous Continuous Manuela Schwartz, NP 75 mL/hr at 01/15/21 0700 New Bag at 01/15/21 0700   fentaNYL (SUBLIMAZE) injection 25 mcg  25 mcg Intravenous Q2H PRN Howerter, Justin B, DO       metroNIDAZOLE (FLAGYL) IVPB 500 mg  500 mg Intravenous Q8H Wouk, Wilfred Curtis, MD 100 mL/hr at 01/15/21 0911 500 mg at 01/15/21 0911   naloxone [facility] injection 0.4 mg  0.4 mg Intravenous PRN Howerter, Justin B, DO       ondansetron (ZOFRAN) injection 4 mg  4 mg Intravenous Q6H PRN Stegmayer, Kimberly A, PA-C       vancomycin (VANCOCIN) IVPB 1000 mg/200 mL premix  1,000 mg Intravenous Q12H Nazari, Walid A, RPH 200 mL/hr at 01/15/21 1119 1,000 mg at 01/15/21 1119     Discharge Medications: Please see discharge summary for a list of discharge  medications.  Relevant Imaging Results:  Relevant Lab Results:   Additional Information SS#: [id number]  Barrie Dunker, RN

## 2021-01-15 NOTE — Anesthesia Preprocedure Evaluation (Signed)
Anesthesia Evaluation  Patient identified by MRN, date of birth, ID band Patient awake    Reviewed: Allergy & Precautions, H&P , NPO status , Patient's Chart, lab work & pertinent test results, reviewed documented beta blocker date and time   History of Anesthesia Complications Negative for: history of anesthetic complications  Airway Mallampati: II  TM Distance: >3 FB Neck ROM: full    Dental  (+) Dental Advidsory Given, Poor Dentition, Chipped, Missing   Pulmonary neg shortness of breath, neg COPD, neg recent URI, Current Smoker and Patient abstained from smoking.,    Pulmonary exam normal breath sounds clear to auscultation       Cardiovascular Exercise Tolerance: Good hypertension, (-) angina+ Peripheral Vascular Disease  (-) Past MI and (-) Cardiac Stents Normal cardiovascular exam(-) dysrhythmias + Valvular Problems/Murmurs  Rhythm:regular Rate:Normal     Neuro/Psych negative neurological ROS  negative psych ROS   GI/Hepatic negative GI ROS, Neg liver ROS,   Endo/Other  diabetes  Renal/GU negative Renal ROS  negative genitourinary   Musculoskeletal   Abdominal   Peds  Hematology negative hematology ROS (+)   Anesthesia Other Findings Past Medical History: No date: Diabetes mellitus without complication (HCC) No date: Hypertension No date: Peripheral vascular disease (HCC)   Reproductive/Obstetrics negative OB ROS                             Anesthesia Physical Anesthesia Plan  ASA: 3  Anesthesia Plan: General   Post-op Pain Management:    Induction: Intravenous  PONV Risk Score and Plan: 1 and Ondansetron, Dexamethasone and Treatment may vary due to age or medical condition  Airway Management Planned: LMA  Additional Equipment:   Intra-op Plan:   Post-operative Plan: Extubation in OR  Informed Consent: I have reviewed the patients History and Physical, chart, labs  and discussed the procedure including the risks, benefits and alternatives for the proposed anesthesia with the patient or authorized representative who has indicated his/her understanding and acceptance.     Dental Advisory Given  Plan Discussed with: Anesthesiologist, CRNA and Surgeon  Anesthesia Plan Comments:         Anesthesia Quick Evaluation

## 2021-01-15 NOTE — Progress Notes (Signed)
Pharmacy Antibiotic Note  Brad Frost is a 65 y.o. male admitted on 01/11/2021 with sepsis and osteomyelitis of R foot.  Pharmacy has been consulted for vancomycin and cefepime dosing dosing.  Patient scheduled to undergo right AKA today, per Vascular  Day 5 of antibiotics  Plan: 1) Vp = 34, Vt = 19 Will adjust dose to Vancomycin 1g Q 12 hours Expected AUC = 530.4 Expected Css = 14.1  2) Continue Cefepime 2g IV q8h   Height: 5\' 11"  (180.3 cm) Weight: 77.2 kg (170 lb 3.1 oz) IBW/kg (Calculated) : 75.3  Temp (24hrs), Avg:99 F (37.2 C), Min:97.6 F (36.4 C), Max:100.4 F (38 C)  Recent Labs  Lab 01/11/21 1846 01/11/21 1938 01/11/21 1951 01/11/21 1951 01/12/21 0818 01/13/21 1542 01/13/21 1728 01/14/21 0438 01/14/21 1428 01/14/21 2213 01/15/21 0405  WBC  --   --  11.0*  --  10.4 11.9*  --  11.1*  --   --  9.6  CREATININE  --   --  0.73   < > 0.62 0.74 0.74 0.68  --   --  0.66  LATICACIDVEN 1.6 1.6  --   --   --   --  0.8  --   --   --   --   VANCOTROUGH  --   --   --   --   --   --   --   --   --  19  --   VANCOPEAK  --   --   --   --   --   --   --   --  34  --   --    < > = values in this interval not displayed.     Estimated Creatinine Clearance: 95.4 mL/min (by C-G formula based on SCr of 0.66 mg/dL).    Allergies  Allergen Reactions   Sildenafil     Antimicrobials this admission: Cefepime 9/18> Vancomycin 9/18>   Microbiology results: 9/18 Ucx NGF 9/18 BCx NGTD   10/18, PharmD Clinical Pharmacist 01/15/2021 2:05 PM

## 2021-01-15 NOTE — Anesthesia Procedure Notes (Signed)
Procedure Name: LMA Insertion Date/Time: 01/15/2021 3:47 PM Performed by: Chelsea Aus, CRNA Pre-anesthesia Checklist: Patient identified, Emergency Drugs available, Suction available and Patient being monitored Patient Re-evaluated:Patient Re-evaluated prior to induction Oxygen Delivery Method: Circle system utilized Preoxygenation: Pre-oxygenation with 100% oxygen Induction Type: IV induction LMA: LMA inserted LMA Size: 4.5 Tube type: Oral Number of attempts: 1 Placement Confirmation: positive ETCO2 and breath sounds checked- equal and bilateral Tube secured with: Tape Dental Injury: Teeth and Oropharynx as per pre-operative assessment

## 2021-01-15 NOTE — Interval H&P Note (Signed)
History and Physical Interval Note:  01/15/2021 2:35 PM  Brad Frost  has presented today for surgery, with the diagnosis of Atherosclerotic disease with gangrene.  The various methods of treatment have been discussed with the patient and family. After consideration of risks, benefits and other options for treatment, the patient has consented to  Procedure(s): AMPUTATION ABOVE KNEE (Right) as a surgical intervention.  The patient's history has been reviewed, patient examined, no change in status, stable for surgery.  I have reviewed the patient's chart and labs.  Questions were answered to the patient's satisfaction.     [name]

## 2021-01-15 NOTE — Progress Notes (Signed)
Patient agitgated and yelling at staff to provide him with his "Credit Card" at 0230. Belongings provided to him and he stated "Now aint nobody getting my card back I'm putting it in my pocket". Charge nurse Zollie Scale to bedside to witness patient education on having the card placed in the facilitys locked box and returned during discharge. Patient remains agitated and refuses. Continued to refuse all care overnight including prepping for surgical procedure, wound care to affected extremity and linen change. Hospitalist notified of patient yelling and agitation and orders were provided for haldol. Dextrose 5 and NS peripheral IV therapy added due to low BS. Spoke with patient sister and encouraged her to come and encourage patients compliance. Will continue to monitor.

## 2021-01-15 NOTE — TOC Progression Note (Signed)
Transition of Care [facility]) - Progression Note    Patient Details  Name: Brad Frost MRN: [id number] Date of Birth: 06-30-1955  Transition of [facility]) CM/SW Contact  Barrie Dunker, RN Phone Number: 01/15/2021, 1:35 PM  Clinical Narrative:     The patient is having surgery AKA today, He is a resident at [facility] but would like to move, Bedsearch sent out for [facility] SNF that will transition to LTC, will review be offers once obtained, Will explain to the patient after he returns from surgery that if a [facility] term bed can not be found he will need to DC back to his LTC facility [facility] where he resides       Expected Discharge Plan and Services                                                 Social Determinants of Health (SDOH) Interventions    Readmission Risk Interventions No flowsheet data found.

## 2021-01-15 NOTE — Progress Notes (Signed)
PROGRESS NOTE  Brad Frost    DOB: August 06, 1955, 65 y.o.  UKG:[id number]  PCP: [name], [name], MD   Code Status: Full Code   DOA: 01/11/2021   LOS: 4  Brief Narrative of Current Hospitalization  Brad Frost is a 65 y.o. male with a PMH significant for DM, history of osteomyelitis, anemia, tobacco use. They presented from home to the ED on 01/11/2021 with right foot pain x several weeks. In the ED, it was found that they had osteomyelitis. They were treated with IV antibiotics and vascular surgery was consulted.  Patient was admitted to medicine service for further workup and management of osteomyelitis as outlined in detail below.  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 Bx Cx NG x4 days.  Afebrile. Sister and brother at bedside who are very helpful in discussing plan with patient and are all in agreement to proceed with the amputation today. - deescalate antibiotics when able - follow up vascular, planning surgery for AKA today - PT/OT post op - pursue SNF placement, TOC consulted - NPO this am  Anemia of chronic disease-hemoglobin 7.9 today.  Patient has severe PAD so  transfusion threshold of 8. Transfusion was ordered yesterday but patient declined.  - continue to monitor with CBC s/p AKA  T2DM- diet controlled.  Blood sugars have been on lower side of normal and patient is asymptomatic. -Discontinued sliding scale insulin.  CBGs with labs.  History of PE - continue xarelto  Tobacco dependence -Provide counseling for wound healing  BPH-Home medications include Flomax -Flomax has been held since admission due to hypotension.   Continue to hold  DVT prophylaxis: SCDs Start: 01/11/21 2110   Diet:  Diet Orders (From admission, onward)     Start     Ordered   01/15/21 0001  Diet NPO time specified  Diet effective midnight        01/14/21 1103            Subjective 01/15/21    Patient is in good spirits today with his siblings visiting. He is agreeable to proceed with amputation.   Disposition Plan & Communication  Status is: Inpatient  Remains inpatient appropriate because:Ongoing diagnostic testing needed not appropriate for outpatient work up  Dispo: The patient is from: Home              Anticipated d/c is to: SNF              Patient currently is not medically stable to d/c.   Difficult to place patient No  Family Communication: NA   Consults, Procedures, Significant Events  Consultants:  Vascular surgery  Procedures/significant events:  none  Antimicrobials:  Anti-infectives (From admission, onward)    Start     Dose/Rate Route Frequency Ordered Stop   01/15/21 1100  vancomycin (VANCOCIN) IVPB 1000 mg/200 mL premix        1,000 mg 200 mL/hr over 60 Minutes Intravenous Every 12 hours 01/15/21 0731     01/15/21 0000  ceFAZolin (ANCEF) IVPB 2g/100 mL premix  Status:  Discontinued       Note to Pharmacy: To be given in  specials   2 g 200 mL/hr over 30 Minutes Intravenous  Once 01/14/21 0908 01/14/21 1100   01/13/21 1700  metroNIDAZOLE (FLAGYL) IVPB 500 mg        500 mg 100 mL/hr over 60 Minutes Intravenous Every 8 hours 01/13/21 1614     01/12/21 1100  vancomycin (VANCOREADY) IVPB 1250 mg/250 mL  Status:  Discontinued        1,250 mg 166.7 mL/hr over 90 Minutes Intravenous Every 12 hours 01/11/21 2146 01/15/21 0731   01/12/21 0530  ceFEPIme (MAXIPIME) 2 g in sodium chloride 0.9 % 100 mL IVPB        2 g 200 mL/hr over 30 Minutes Intravenous Every 8 hours 01/11/21 2146     01/11/21 2145  vancomycin (VANCOREADY) IVPB 750 mg/150 mL        750 mg 150 mL/hr over 60 Minutes Intravenous   Once 01/11/21 2141 01/12/21 0107   01/11/21 2045  ceFEPIme (MAXIPIME) 2 g in sodium chloride 0.9 % 100 mL IVPB        2 g 200 mL/hr over 30 Minutes Intravenous  Once 01/11/21 2036 01/11/21 2204   01/11/21 2045  vancomycin (VANCOCIN) IVPB 1000 mg/200 mL premix        1,000 mg 200 mL/hr over 60 Minutes Intravenous  Once 01/11/21 2036 01/11/21 2346        Objective   Vitals:   01/14/21 2012 01/14/21 2349 01/15/21 0500 01/15/21 0737  BP: 107/60 101/71  104/61  Pulse: (!) 111 99  99  Resp: 16 17  16   Temp: 98.4 F (36.9 C) 99.4 F (37.4 C)  (!) 100.4 F (38 C)  TempSrc: Oral   Oral  SpO2: 98% 98%  96%  Weight:   77.2 kg   Height:        Intake/Output Summary (Last 24 hours) at 01/15/2021 0746 Last data filed at 01/15/2021 0700 Gross per 24 hour  Intake 844.02 ml  Output 1200 ml  Net -355.98 ml    Filed Weights   01/11/21 1826 01/14/21 0459 01/15/21 0500  Weight: 78 kg 77.3 kg 77.2 kg    Patient BMI: Body mass index is 23.74 kg/m.   Physical Exam: General: awake, alert, NAD HEENT: atraumatic, clear conjunctiva, anicteric sclera, moist mucus membranes, hearing grossly normal Respiratory: CTAB, no wheezes, rales or rhonchi, normal respiratory effort. Cardiovascular: normal S1/S2,  RRR, Nervous: A&O x2. no gross focal neurologic deficits, normal speech Extremities: moves all equally, no edema, normal tone. R LE with erythema, tenderness to palpation. Pedal pulse weak. Sensation diminished Skin: dry, intact, normal temperature, normal color centrally Psychiatry: cooperative  Labs   I have personally reviewed following labs and imaging studies No results displayed because visit has over 200 results.       Recent Results (from the past 240 hour(s))  Blood culture (routine single)     Status: None (Preliminary result)   Collection Time: 01/11/21  7:51 PM   Specimen: BLOOD  Result Value Ref Range Status   Specimen Description BLOOD LEFT ANTECUBITAL  Final   Special  Requests   Final    BOTTLES DRAWN AEROBIC AND ANAEROBIC Blood Culture adequate volume   Culture   Final    NO GROWTH 4 DAYS Performed at [facility], [address] [place], [place]    Report Status PENDING  Incomplete  Resp Panel by RT-PCR (Flu A&B, Covid) Nasopharyngeal Swab     Status: None   Collection Time: 01/11/21  9:35 PM   Specimen: Nasopharyngeal Swab; Nasopharyngeal(NP) swabs in vial transport medium  Result Value Ref Range Status   SARS Coronavirus 2 by RT PCR NEGATIVE NEGATIVE Final    Comment: (NOTE) SARS-CoV-2 target nucleic acids are NOT DETECTED.  The SARS-CoV-2 RNA is generally detectable in upper respiratory specimens during the acute phase of infection. The lowest concentration of SARS-CoV-2 viral copies this assay can detect is 138 copies/mL. A negative result does not preclude SARS-Cov-2 infection and should not be used as the sole basis for treatment or other patient management decisions. A negative result may occur with  improper specimen collection/handling, submission of specimen other than nasopharyngeal swab, presence of viral mutation(s) within the areas targeted by this assay, and inadequate number of viral copies(<138 copies/mL). A negative result must be combined with clinical observations, patient history, and epidemiological information. The expected result is Negative.  Fact Sheet for Patients:  BloggerCourse.com  Fact Sheet for Healthcare Providers:  SeriousBroker.it  This test is no t yet approved or cleared by the [place] FDA and  has been authorized for detection and/or diagnosis of SARS-CoV-2 by FDA under an Emergency Use Authorization (EUA). This EUA will remain  in effect (meaning this test can be used) for the duration of the COVID-19 declaration under Section 564(b)(1) of the Act, 21 U.S.C.section 360bbb-3(b)(1), unless the authorization is terminated  or  revoked sooner.       Influenza A by PCR NEGATIVE NEGATIVE Final   Influenza B by PCR NEGATIVE NEGATIVE Final    Comment: (NOTE) The Xpert Xpress SARS-CoV-2/FLU/RSV plus assay is intended as an aid in the diagnosis of influenza from Nasopharyngeal swab specimens and should not be used as a sole basis for treatment. Nasal washings and aspirates are unacceptable for Xpert Xpress SARS-CoV-2/FLU/RSV testing.  Fact Sheet for Patients: BloggerCourse.com  Fact Sheet for Healthcare Providers: SeriousBroker.it  This test is not yet approved or cleared by the [place] FDA and has been authorized for detection and/or diagnosis of SARS-CoV-2 by FDA under an Emergency Use Authorization (EUA). This EUA will remain in effect (meaning this test can be used) for the duration of the COVID-19 declaration under Section 564(b)(1) of the Act, 21 U.S.C. section 360bbb-3(b)(1), unless the authorization is terminated or revoked.  Performed at [facility], [address], [place], [place] [address]   Urine Culture     Status: None   Collection Time: 01/12/21  7:47 AM   Specimen: In/Out Cath Urine  Result Value Ref Range Status   Specimen Description   Final    IN/OUT CATH URINE Performed at [facility], [address], [place], [place] [address]    Special Requests   Final    NONE Performed at [facility], [address], [place], [place] [address]    Culture   Final    NO GROWTH Performed at [facility], [address] [place], [place] [address]    Report Status 01/13/2021 FINAL  Final     Imaging Studies  No results found. Medications   Scheduled Meds:  aspirin EC  81 mg Oral Daily   atorvastatin  80 mg Oral Daily   Continuous Infusions:  ceFEPime (MAXIPIME) IV 2 g (01/15/21 0548)   dextrose 5 % and 0.9% NaCl 75 mL/hr at 01/15/21 0700   metronidazole 500 mg (01/15/21 0034)   vancomycin        LOS: 4 days   Time spent: >72min   Angelamarie Avakian L  Dareen Piano, DO [facility] 01/15/2021, 7:46 AM   To contact the [facility] Attending or Consulting provider for this patient: Check the care team in [facility] for a) attending/consulting TR[facility] provider listed and b) the [facility] team listed Log into [email or web address] and use [place]'s universal password to access. If you do not have the password, please contact the hospital operator. Locate the [facility] provider you are looking for under [facility] and page to a number that you can be directly reached. If you still have difficulty reaching the provider, please page the [facility] (Director on Call) for the Hospitalists listed on amion for assistance.

## 2021-01-15 NOTE — Anesthesia Postprocedure Evaluation (Signed)
Anesthesia Post Note  Patient: Brad Frost  Procedure(s) Performed: AMPUTATION ABOVE KNEE (Right: Knee)  Patient location during evaluation: PACU Anesthesia Type: General Level of consciousness: awake and alert Pain management: pain level controlled Vital Signs Assessment: post-procedure vital signs reviewed and stable Respiratory status: spontaneous breathing, nonlabored ventilation, respiratory function stable and patient connected to nasal cannula oxygen Cardiovascular status: blood pressure returned to baseline and stable Postop Assessment: no apparent nausea or vomiting Anesthetic complications: no   No notable events documented.   Last Vitals:  Vitals:   01/15/21 1745 01/15/21 1756  BP: 106/67 103/63  Pulse: 100 100  Resp: 17 10  Temp:  36.6 C  SpO2: 93% 93%    Last Pain:  Vitals:   01/15/21 1800  TempSrc:   PainSc: 3                  Corinda Gubler

## 2021-01-15 NOTE — Op Note (Signed)
  [facility]   OPERATIVE NOTE   PROCEDURE:  Right above-the-knee amputation  PRE-OPERATIVE DIAGNOSIS: Right foot gangrene  POST-OPERATIVE DIAGNOSIS: same as above  SURGEON:  [name], MD  ASSISTANT(S): Raul Del, PA-C  ANESTHESIA: general  ESTIMATED BLOOD LOSS: 200 cc  FINDING(S): none  SPECIMEN(S):  Right above-the-knee amputation  INDICATIONS:   Brad Frost is a 65 y.o. male who presents with right foot and lower leg gangrene.  The patient is scheduled for a right above-the-knee amputation.  I discussed in depth with the patient the risks, benefits, and alternatives to this procedure.  The patient is aware that the risk of this operation included but are not limited to:  bleeding, infection, myocardial infarction, stroke, death, failure to heal amputation wound, and possible need for more proximal amputation.  The patient and his family are aware of the risks and agrees proceed forward with the procedure. An assistant was present during the procedure to help facilitate the exposure and expedite the procedure.   DESCRIPTION: After full informed written consent was obtained from the patient, the patient was taken to the operating room, and placed supine upon the operating table.  Prior to induction, the patient received IV antibiotics.  The patient was then prepped and draped in the standard fashion for a right above-the-knee amputation.  After obtaining adequate anesthesia, the patient was prepped and draped in the standard fashion for a above-the-knee amputation. The assistant provided retraction and mobilization to help facilitate exposure and expedite the procedure throughout the entire procedure.  This included following suture, using retractors, and optimizing lighting.  I marked out the anterior and posterior flaps for a fish-mouth type of amputation.  I made the incisions for these flaps, and then dissected through the subcutaneous tissue,  fascia, and muscles circumferentially.  I elevated  the periosteal tissue 4-5 cm more proximal than the anterior skin flap.  I then transected the femur with a power saw at this level.  Then I smoothed out the rough edges of the bone.  At this point, the specimen was passed off the field as the above-the-knee amputation.  At this point, I clamped all visibly bleeding arteries and veins using a combination of suture ligation with silk suture and electrocautery.   Bleeding continued to be controlled with electrocautery and suture ligature.  The stump was washed off with sterile normal saline and no further active bleeding was noted.  I reapproximated the anterior and posterior fascia  with interrupted stitches of 0 Vicryl.  This was completed along the entire length of anterior and posterior fascia until there were no more loose space in the fascial line. The subcutaneous tissue was then approximated with 2-0 vicryl sutures. The skin was then  reapproximated with staples.  The stump was washed off and dried.  The incision was dressed with Xeroform and ABD pads, and  then fluffs were applied.  Kerlix was wrapped around the leg and then gently an ACE wrap was applied.  A large Ioban was then placed over the ACE wrap to secure the dressing. The patient was then awakened and take to the recovery room in stable condition.   COMPLICATIONS: none  CONDITION: stable  [name]  01/15/2021, 4:49 PM   This note was created with Dragon Medical transcription system. Any errors in dictation are purely unintentional.

## 2021-01-15 NOTE — Transfer of Care (Signed)
Immediate Anesthesia Transfer of Care Note  Patient: ARKEL CARTWRIGHT  Procedure(s) Performed: AMPUTATION ABOVE KNEE (Right: Knee)  Patient Location: PACU  Anesthesia Type:General  Level of Consciousness: awake, alert , patient cooperative and responds to stimulation  Airway & Oxygen Therapy: Patient Spontanous Breathing and Patient connected to nasal cannula oxygen  Post-op Assessment: Report given to RN  Post vital signs: Reviewed and stable  Last Vitals:  Vitals Value Taken Time  BP    Temp    Pulse 106 01/15/21 1658  Resp 16 01/15/21 1658  SpO2 100 % 01/15/21 1658  Vitals shown include unvalidated device data.  Last Pain:  Vitals:   01/15/21 1419  TempSrc: Tympanic  PainSc: 0-No pain      Patients Stated Pain Goal: 0 (01/15/21 1419)  Complications: No notable events documented.

## 2021-01-15 NOTE — Progress Notes (Signed)
Pt returned from PACU status-post R AKA. Pt handoff completed at bedside with PACU RN. Dressing is clean, dry and intact, VSS. Pt states pain is well controlled at this time. Pt resting comfortably in bed awaiting meal tray delivery.

## 2021-01-15 NOTE — Progress Notes (Addendum)
Pt refused blood glucose check at this time. RN provided education. MD notified.

## 2021-01-16 ENCOUNTER — Encounter: Payer: Self-pay | Admitting: Vascular Surgery

## 2021-01-16 DIAGNOSIS — M869 Osteomyelitis, unspecified: Secondary | ICD-10-CM

## 2021-01-16 DIAGNOSIS — A419 Sepsis, unspecified organism: Principal | ICD-10-CM

## 2021-01-16 LAB — BASIC METABOLIC PANEL
Anion gap: 6 (ref 5–15)
BUN: 9 mg/dL (ref 8–23)
CO2: 22 mmol/L (ref 22–32)
Calcium: 7.6 mg/dL — ABNORMAL LOW (ref 8.9–10.3)
Chloride: 108 mmol/L (ref 98–111)
Creatinine, Ser: 0.81 mg/dL (ref 0.61–1.24)
GFR, Estimated: >60 mL/min (ref >60)
Glucose, Bld: 194 mg/dL — ABNORMAL HIGH (ref 70–99)
Potassium: 3.7 mmol/L (ref 3.5–5.1)
Sodium: 136 mmol/L (ref 135–145)

## 2021-01-16 LAB — HEMOGLOBIN AND HEMATOCRIT, BLOOD
HCT: 22.7 % — ABNORMAL LOW (ref 39.0–52.0)
Hemoglobin: 7 g/dL — ABNORMAL LOW (ref 13.0–17.0)

## 2021-01-16 LAB — CBC
HCT: 23.7 % — ABNORMAL LOW (ref 39.0–52.0)
Hemoglobin: 7.2 g/dL — ABNORMAL LOW (ref 13.0–17.0)
MCH: 21.7 pg — ABNORMAL LOW (ref 26.0–34.0)
MCHC: 30.4 g/dL (ref 30.0–36.0)
MCV: 71.4 fL — ABNORMAL LOW (ref 80.0–100.0)
Platelets: 480 10*3/uL — ABNORMAL HIGH (ref 150–400)
RBC: 3.32 MIL/uL — ABNORMAL LOW (ref 4.22–5.81)
RDW: 20.8 % — ABNORMAL HIGH (ref 11.5–15.5)
WBC: 15.4 10*3/uL — ABNORMAL HIGH (ref 4.0–10.5)
nRBC: 0 % (ref 0.0–0.2)

## 2021-01-16 LAB — CULTURE, BLOOD (SINGLE)
Culture: NO GROWTH
Special Requests: ADEQUATE

## 2021-01-16 LAB — GLUCOSE, CAPILLARY
Glucose-Capillary: 136 mg/dL — ABNORMAL HIGH (ref 70–99)
Glucose-Capillary: 148 mg/dL — ABNORMAL HIGH (ref 70–99)

## 2021-01-16 MED ORDER — HALOPERIDOL 2 MG PO TABS
2.0000 mg | ORAL_TABLET | Freq: Four times a day (QID) | ORAL | Status: DC | PRN
  Filled 2021-01-16: qty 1

## 2021-01-16 MED ORDER — OXYCODONE HCL 5 MG PO TABS
5.0000 mg | ORAL_TABLET | ORAL | Status: DC | PRN
  Administered 2021-01-17 – 2021-01-18 (×2): 5 mg via ORAL
  Filled 2021-01-16 (×2): qty 1

## 2021-01-16 MED ORDER — ADULT MULTIVITAMIN W/MINERALS CH
1.0000 | ORAL_TABLET | Freq: Every day | ORAL | Status: DC
  Administered 2021-01-18 – 2021-01-19 (×2): 1 via ORAL
  Filled 2021-01-16 (×2): qty 1

## 2021-01-16 MED ORDER — MORPHINE SULFATE (PF) 2 MG/ML IV SOLN: 2.0000 mg | INTRAVENOUS | Status: DC | PRN

## 2021-01-16 MED ORDER — GABAPENTIN 100 MG PO CAPS
100.0000 mg | ORAL_CAPSULE | Freq: Three times a day (TID) | ORAL | Status: DC
  Administered 2021-01-16 – 2021-01-19 (×6): 100 mg via ORAL
  Filled 2021-01-16 (×7): qty 1

## 2021-01-16 MED ORDER — KETOROLAC TROMETHAMINE 15 MG/ML IJ SOLN
15.0000 mg | Freq: Four times a day (QID) | INTRAMUSCULAR | Status: AC
  Administered 2021-01-16 – 2021-01-17 (×3): 15 mg via INTRAVENOUS
  Filled 2021-01-16 (×3): qty 1

## 2021-01-16 MED ORDER — HALOPERIDOL LACTATE 5 MG/ML IJ SOLN
2.0000 mg | Freq: Once | INTRAMUSCULAR | Status: AC
  Administered 2021-01-16: 2 mg via INTRAVENOUS
  Filled 2021-01-16: qty 1

## 2021-01-16 MED ORDER — ENSURE ENLIVE PO LIQD
237.0000 mL | Freq: Three times a day (TID) | ORAL | Status: DC
  Administered 2021-01-17 – 2021-01-19 (×7): 237 mL via ORAL

## 2021-01-16 MED ORDER — HYDROCODONE-ACETAMINOPHEN 5-325 MG PO TABS
1.0000 | ORAL_TABLET | Freq: Four times a day (QID) | ORAL | Status: DC | PRN
  Filled 2021-01-16: qty 2

## 2021-01-16 NOTE — Progress Notes (Signed)
PT Cancellation Note  Patient Details Name: Brad Frost MRN: [id number] DOB: 27-Sep-1955   Cancelled Treatment:    Reason Eval/Treat Not Completed: Other (comment). 2 attempts made to evaluate based on pain meds. Pt agitated when therapist attempts to introduce self and states "I don't care what your name is, why are you trying to sell me something?". Pt very adamant that he wants therapist to leave room and he says he can teach himself to walk. Reports he wants a RW to take home. Educated and encouraged mobilization, however continues to refuse and becomes angry. Will re-attempt when patient willing.   Oliva Montecalvo 01/16/2021, 3:36 PM Elizabeth Palau, PT, DPT [phone]

## 2021-01-16 NOTE — Progress Notes (Signed)
Initial Nutrition Assessment  DOCUMENTATION CODES:   Severe malnutrition in context of chronic illness  INTERVENTION:   -Ensure Enlive po BID, each supplement provides 350 kcal and 20 grams of protein  -Magic cup BID with meals, each supplement provides 290 kcal and 9 grams of protein  -MVI with minerals daily  NUTRITION DIAGNOSIS:   Severe Malnutrition related to chronic illness (PVD) as evidenced by percent weight loss, moderate fat depletion, severe fat depletion, moderate muscle depletion, severe muscle depletion.  GOAL:   Patient will meet greater than or equal to 90% of their needs  MONITOR:   PO intake, Supplement acceptance, Labs, Weight trends, Skin, I & O's  REASON FOR ASSESSMENT:   Consult Assessment of nutrition requirement/status, Wound healing  ASSESSMENT:   Brad Frost is a 65 y.o. male with medical history significant for chronic diabetic foot ulcers, peripheral vascular disease, type 2 diabetes mellitus, anemia of chronic disease associated with baseline hemoglobin 7.5-9, who is admitted to [facility] on 01/11/2021 with progressive osteomyelitis of a gangrenous right foot after presenting from home to [facility] ED complaining of worsening right foot ulcers.  Pt admitted with sepsis secondary to acute osteomyelitis of gangrenous rt foot.   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 He reports he has a good appetite and has been consuming most of his meals here. Noted documented meal completion 100%. Observed lunch tray, which was untouched. Pt reports "I was asleep; I will try to eat it after my nap".   Pt reports good appetite PTA. Attempted to elicit a diet recall, however, pt reports "I don't know what you want me to say. I eat what I want to eat when I want to eat. Whatever I can find. You expect me to have a  specific answer for that?". RD assured pt that this writer was attempting to obtain a thorough history to better care for him. Pt smiled and replied "I hope you come and visit me again. I'll have answer for you then".   Pt reports he that he has lost weight during hospitalization, but does not think he has lost weight PTA. He is unsure of UBW. Reviewed wt hx; pt has experienced a 6.9% wt loss over the past month, which is significant for time frame.   Discussed importance of good meal and supplement intake to promote healing.   Medications reviewed.   Lab Results  Component Value Date   HGBA1C 5.7 (H) 09/16/2020   PTA DM medications are none.   Labs reviewed: CBGS: 113-266 (inpatient orders for glycemic control are none).    NUTRITION - FOCUSED PHYSICAL EXAM:  Flowsheet Row Most Recent Value  Orbital Region Moderate depletion  Upper Arm Region Moderate depletion  Thoracic and Lumbar Region Severe depletion  Buccal Region Severe depletion  Temple Region Severe depletion  Clavicle Bone Region Severe depletion  Clavicle and Acromion Bone Region Severe depletion  Scapular Bone Region Severe depletion  Dorsal Hand Moderate depletion  Patellar Region Moderate depletion  Anterior Thigh Region Moderate depletion  Posterior Calf Region Moderate depletion  Edema (RD Assessment) Mild  Hair Reviewed  Eyes Reviewed  Mouth Reviewed  Skin Reviewed  Nails Reviewed       Diet Order:   Diet Order  Diet regular Room service appropriate? Yes; Fluid consistency: Thin  Diet effective now                   EDUCATION NEEDS:   Education needs have been addressed  Skin:  Skin Assessment: Skin Integrity Issues: Skin Integrity Issues:: Incisions Incisions: closed rt thigh s/p rt AKA  Last BM:  01/15/21  Height:   Ht Readings from Last 1 Encounters:  01/15/21 5\' 11"  (1.803 m)    Weight:   Wt Readings from Last 1 Encounters:  01/16/21 72.6 kg    Ideal Body  Weight:  71.9 kg (adjusted for rt AKA)  BMI:  Body mass index is 22.32 kg/m.  Estimated Nutritional Needs:   Kcal:  2300-2500  Protein:  125-140 grams  Fluid:  > 2 L    01/18/21, RD, LDN, CDCES Registered Dietitian II Certified Diabetes Care and Education Specialist Please refer to [facility] for RD and/or RD on-call/weekend/after hours pager

## 2021-01-16 NOTE — Progress Notes (Signed)
Brad Frost & Vascular Surgery Daily Progress Note  01/15/21: Right above-the-knee amputation  Subjective: Patient without complaint this AM.  No acute issues overnight.  Objective: Vitals:   01/15/21 1800 01/15/21 2054 01/16/21 0101 01/16/21 0500  BP: 106/70 99/60 102/63   Pulse: 95 100 (!) 106   Resp: 16 16 16    Temp: 97.6 F (36.4 C) 98.2 F (36.8 C) 98 F (36.7 C)   TempSrc:      SpO2:  96% 97%   Weight:    72.6 kg  Height:        Intake/Output Summary (Last 24 hours) at 01/16/2021 1204 Last data filed at 01/16/2021 1010 Gross per 24 hour  Intake 1640 ml  Output 1650 ml  Net -10 ml   Physical Exam: A&Ox3, NAD CV: RRR Pulmonary: CTA Bilaterally Abdomen: Soft, Nontender, Nondistended Vascular:  Right lower extremity: Thigh soft.  Our dressing is intact clean and dry.   Laboratory: CBC    Component Value Date/Time   WBC 15.4 (H) 01/16/2021 0918   HGB 7.2 (L) 01/16/2021 0918   HCT 23.7 (L) 01/16/2021 0918   PLT 480 (H) 01/16/2021 0918   BMET    Component Value Date/Time   NA 136 01/16/2021 0918   K 3.7 01/16/2021 0918   CL 108 01/16/2021 0918   CO2 22 01/16/2021 0918   GLUCOSE 194 (H) 01/16/2021 0918   BUN 9 01/16/2021 0918   CREATININE 0.81 01/16/2021 0918   CALCIUM 7.6 (L) 01/16/2021 0918   GFRNONAA >60 01/16/2021 0918   GFRAA >60 04/14/2019 1345   Assessment/Planning: The patient is a 65 year old male with severe atherosclerotic disease with gangrene to the right lower extremity now status post right above-the-knee amputation - POD#1  1) hemoglobin is relatively stable. Increase in white blood cell count which is to be expected.  AM CBC. 2) normal creatinine.  We will stop IV fluids.  AM BMP. 3) we will plan on changing OR dressing either Saturday or Sunday. 4) patient is currently on fentanyl, morphine and Vicodin.  We will try oxycodone, Toradol, Neurontin and breakthrough morphine instead. 5) patient is on aspirin and statin for medical  management. 6) okay to start working with PT/OT.  Encouraged out of bed and ambulation. 7) would assume dispo back to SNF. 8) consult placed to nutrition to optimize wound healing.  Discussed with Dr. [name] PA-C 01/16/2021 12:04 PM

## 2021-01-16 NOTE — Progress Notes (Signed)
Patients mood improved since previous day, he was pleasant and compliant with all interventions. He denied pain with frequent assess. Dressing to AKA site remains clean, dry and intact. Refused morning labs. Vitals stable, no adverse events during shift. Will continue to monitor.

## 2021-01-16 NOTE — Progress Notes (Signed)
[facility] PROGRESS NOTE    Brad Frost  PPJ:[id number] DOB: [birth year] DOA: 01/11/2021 PCP: [name], [name], MD   Brief Narrative:  This 65 y.o. male with PMH significant for DM, history of osteomyelitis, anemia, tobacco use presented from home to the ED on 01/11/2021 with right foot pain x several weeks. In the ED, he was found that he had osteomyelitis and was treated with IV antibiotics and vascular surgery was consulted.  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  Assessment & Plan:   Principal Problem:   Osteomyelitis of right foot (HCC) Active Problems:   Type 2 diabetes mellitus with diabetic neuropathy, without long-term current use of insulin (HCC)   Gangrene of right foot (HCC)   Anemia of chronic disease   Sepsis (HCC)   Tobacco abuse  Osteomyelitis of right foot with severe PAD: Patient presented with chronic right foot pain and wound.  Bx Cx NG x 4 days.  Afebrile. Continued on IV antibiotics, vascular surgery was consulted. De-escalate antibiotics when possible. Patient  s/p right above-knee amputation 9/23. PT and OT evaluation. pursue SNF placement, TOC consulted  Microcytic hypochromic anemia. Patient has severe PAD, transfusion threshold 8.0 Hemoglobin 7.9.  Patient refused blood transfusion. Continue to monitor with CBC s/p AKA.  Type 2 diabetes: Diet controlled, No RISS Check CBGs with labs   History of PE: Xarelto is on hold.  Tobacco dependence:   Counseled on quit smoking which will help in wound healing  BPH: Flomax has been held because of hypotension.    DVT prophylaxis: SCDs Code Status: Full code Family Communication: No family at bedside Disposition Plan:   Status is: Inpatient  Remains inpatient appropriate because:Inpatient level of care appropriate due to severity of illness  Dispo: The patient is from: Home              Anticipated d/c is to: SNF              Patient currently is not medically stable to d/c.    Difficult to place patient No  Consultants:  Vascular surgery.  Procedures: Right above-knee amputation. Antimicrobials:    Anti-infectives (From admission, onward)    Start     Dose/Rate Route Frequency Ordered Stop   01/16/21 0600  vancomycin (VANCOCIN) IVPB 1000 mg/200 mL premix  Status:  Discontinued        1,000 mg 200 mL/hr over 60 Minutes Intravenous On call to O.R. 01/15/21 1418 01/15/21 1813   01/15/21 1440  ceFAZolin (ANCEF) 2-4 GM/100ML-% IVPB       Note to Pharmacy: Desma Paganini   : cabinet override      01/15/21 1440 01/16/21 0244   01/15/21 1100  vancomycin (VANCOCIN) IVPB 1000 mg/200 mL premix        1,000 mg 200 mL/hr over 60 Minutes Intravenous Every 12 hours 01/15/21 0731     01/15/21 0000  ceFAZolin (ANCEF) IVPB 2g/100 mL premix  Status:  Discontinued       Note to Pharmacy: To be given in specials   2 g 200 mL/hr over 30 Minutes Intravenous  Once 01/14/21 0908 01/14/21 1100   01/13/21 1700  metroNIDAZOLE (FLAGYL) IVPB 500 mg        500 mg 100 mL/hr over 60 Minutes Intravenous Every 8 hours 01/13/21 1614     01/12/21 1100  vancomycin (VANCOREADY) IVPB 1250 mg/250 mL  Status:  Discontinued        1,250 mg 166.7 mL/hr over 90 Minutes Intravenous Every 12  hours 01/11/21 2146 01/15/21 0731   01/12/21 0530  ceFEPIme (MAXIPIME) 2 g in sodium chloride 0.9 % 100 mL IVPB        2 g 200 mL/hr over 30 Minutes Intravenous Every 8 hours 01/11/21 2146     01/11/21 2145  vancomycin (VANCOREADY) IVPB 750 mg/150 mL        750 mg 150 mL/hr over 60 Minutes Intravenous  Once 01/11/21 2141 01/12/21 0107   01/11/21 2045  ceFEPIme (MAXIPIME) 2 g in sodium chloride 0.9 % 100 mL IVPB        2 g 200 mL/hr over 30 Minutes Intravenous  Once 01/11/21 2036 01/11/21 2204   01/11/21 2045  vancomycin (VANCOCIN) IVPB 1000 mg/200 mL premix        1,000 mg 200 mL/hr over 60 Minutes Intravenous  Once 01/11/21 2036 01/11/21 2346      Subjective: Patient was seen and examined at bedside.   Overnight events noted.  Patient reports feeling much improved. He reports pain is better controlled.  S/p right AKA,  wound VAC dressing noted.  Objective: Vitals:   01/15/21 1800 01/15/21 2054 01/16/21 0101 01/16/21 0500  BP: 106/70 99/60 102/63   Pulse: 95 100 (!) 106   Resp: 16 16 16    Temp: 97.6 F (36.4 C) 98.2 F (36.8 C) 98 F (36.7 C)   TempSrc:      SpO2:  96% 97%   Weight:    72.6 kg  Height:        Intake/Output Summary (Last 24 hours) at 01/16/2021 1404 Last data filed at 01/16/2021 1010 Gross per 24 hour  Intake 1640 ml  Output 1650 ml  Net -10 ml   Filed Weights   01/15/21 0500 01/15/21 1419 01/16/21 0500  Weight: 77.2 kg 77.2 kg 72.6 kg    Examination:  General exam: Appears comfortable, not in any acute distress. Respiratory system: Clear to auscultation bilaterally, respiratory effort normal. Cardiovascular system: S1-S2 heard, regular rate and rhythm, no murmur. Gastrointestinal system: Abdomen is soft, nontender, nondistended, BS+ Central nervous system: Alert and oriented X3. No focal neurological deficits. Extremities: Right AKA, dressing and wound VAC noted.  No oozing or soaking of blood. Skin: No rashes, lesions or ulcers Psychiatry: Judgement and insight appear normal. Mood & affect appropriate.     Data Reviewed: I have personally reviewed following labs and imaging studies  CBC: Recent Labs  Lab 01/11/21 1951 01/12/21 0818 01/13/21 1542 01/14/21 0438 01/15/21 0405 01/16/21 0918  WBC 11.0* 10.4 11.9* 11.1* 9.6 15.4*  NEUTROABS 7.7 7.4  --   --   --   --   HGB 9.1* 8.0* 7.8* 7.7* 7.9* 7.2*  HCT 29.5* 25.8* 24.0* 24.2* 25.9* 23.7*  MCV 73.8* 71.1* 70.4* 71.0* 71.2* 71.4*  PLT 476* 422* 400 431* 445* 480*   Basic Metabolic Panel: Recent Labs  Lab 01/11/21 1936 01/11/21 1951 01/12/21 0818 01/13/21 1542 01/13/21 1728 01/14/21 0438 01/15/21 0405 01/16/21 0918  NA  --    < > 137 134* 134* 136 136 136  K  --    < > 3.8 3.4*  3.4* 3.1* 3.9 3.7  CL  --    < > 106 106 106 107 108 108  CO2  --    < > 23 22 22 22 23 22   GLUCOSE  --    < > 89 84 82 72 77 194*  BUN  --    < > 9 10 9 9 9  9  CREATININE  --    < > 0.62 0.74 0.74 0.68 0.66 0.81  CALCIUM  --    < > 7.9* 7.4* 7.5* 7.6* 7.8* 7.6*  MG 2.1  --  1.7  --   --   --  1.8  --   PHOS  --   --   --   --   --   --  2.8  --    < > = values in this interval not displayed.   GFR: Estimated Creatinine Clearance: 90.9 mL/min (by C-G formula based on SCr of 0.81 mg/dL). Liver Function Tests: Recent Labs  Lab 01/11/21 1951 01/12/21 0818  AST 24 19  ALT 27 20  ALKPHOS 87 68  BILITOT 0.5 0.7  PROT 6.3* 5.2*  ALBUMIN 2.2* 1.8*   No results for input(s): LIPASE, AMYLASE in the last 168 hours. No results for input(s): AMMONIA in the last 168 hours. Coagulation Profile: Recent Labs  Lab 01/11/21 1951 01/12/21 0818 01/15/21 0405  INR 1.1 1.1 1.3*   Cardiac Enzymes: No results for input(s): CKTOTAL, CKMB, CKMBINDEX, TROPONINI in the last 168 hours. BNP (last 3 results) No results for input(s): PROBNP in the last 8760 hours. HbA1C: No results for input(s): HGBA1C in the last 72 hours. CBG: Recent Labs  Lab 01/15/21 0746 01/15/21 1419 01/15/21 1702 01/15/21 2159 01/16/21 0821  GLUCAP 94 100* 113* 266* 136*   Lipid Profile: No results for input(s): CHOL, HDL, LDLCALC, TRIG, CHOLHDL, LDLDIRECT in the last 72 hours. Thyroid Function Tests: No results for input(s): TSH, T4TOTAL, FREET4, T3FREE, THYROIDAB in the last 72 hours. Anemia Panel: No results for input(s): VITAMINB12, FOLATE, FERRITIN, TIBC, IRON, RETICCTPCT in the last 72 hours. Sepsis Labs: Recent Labs  Lab 01/11/21 1846 01/11/21 1938 01/13/21 1728  LATICACIDVEN 1.6 1.6 0.8    Recent Results (from the past 240 hour(s))  Blood culture (routine single)     Status: None   Collection Time: 01/11/21  7:51 PM   Specimen: BLOOD  Result Value Ref Range Status   Specimen Description BLOOD LEFT  ANTECUBITAL  Final   Special Requests   Final    BOTTLES DRAWN AEROBIC AND ANAEROBIC Blood Culture adequate volume   Culture   Final    NO GROWTH 5 DAYS Performed at [facility], [address] [place], [place] [address]    Report Status 01/16/2021 FINAL  Final  Resp Panel by RT-PCR (Flu A&B, Covid) Nasopharyngeal Swab     Status: None   Collection Time: 01/11/21  9:35 PM   Specimen: Nasopharyngeal Swab; Nasopharyngeal(NP) swabs in vial transport medium  Result Value Ref Range Status   SARS Coronavirus 2 by RT PCR NEGATIVE NEGATIVE Final    Comment: (NOTE) SARS-CoV-2 target nucleic acids are NOT DETECTED.  The SARS-CoV-2 RNA is generally detectable in upper respiratory specimens during the acute phase of infection. The lowest concentration of SARS-CoV-2 viral copies this assay can detect is 138 copies/mL. A negative result does not preclude SARS-Cov-2 infection and should not be used as the sole basis for treatment or other patient management decisions. A negative result may occur with  improper specimen collection/handling, submission of specimen other than nasopharyngeal swab, presence of viral mutation(s) within the areas targeted by this assay, and inadequate number of viral copies(<138 copies/mL). A negative result must be combined with clinical observations, patient history, and epidemiological information. The expected result is Negative.  Fact Sheet for Patients:  BloggerCourse.com  Fact Sheet for Healthcare Providers:  SeriousBroker.it  This test is no t yet approved or cleared by the [place] and  has been authorized for detection and/or diagnosis of SARS-CoV-2 by FDA under an Emergency Use Authorization (EUA). This EUA will remain  in effect (meaning this test can be used) for the duration of the COVID-19 declaration under Section 564(b)(1) of the Act, 21 U.S.C.section 360bbb-3(b)(1), unless the  authorization is terminated  or revoked sooner.       Influenza A by PCR NEGATIVE NEGATIVE Final   Influenza B by PCR NEGATIVE NEGATIVE Final    Comment: (NOTE) The Xpert Xpress SARS-CoV-2/FLU/RSV plus assay is intended as an aid in the diagnosis of influenza from Nasopharyngeal swab specimens and should not be used as a sole basis for treatment. Nasal washings and aspirates are unacceptable for Xpert Xpress SARS-CoV-2/FLU/RSV testing.  Fact Sheet for Patients: BloggerCourse.com  Fact Sheet for Healthcare Providers: SeriousBroker.it  This test is not yet approved or cleared by the [place] FDA and has been authorized for detection and/or diagnosis of SARS-CoV-2 by FDA under an Emergency Use Authorization (EUA). This EUA will remain in effect (meaning this test can be used) for the duration of the COVID-19 declaration under Section 564(b)(1) of the Act, 21 U.S.C. section 360bbb-3(b)(1), unless the authorization is terminated or revoked.  Performed at [facility], [address], [place], [place] [address]   Urine Culture     Status: None   Collection Time: 01/12/21  7:47 AM   Specimen: In/Out Cath Urine  Result Value Ref Range Status   Specimen Description   Final    IN/OUT CATH URINE Performed at [facility], [address] [place], [place] [address]    Special Requests   Final    NONE Performed at [facility], [address], [place], [place] [address]    Culture   Final    NO GROWTH Performed at [facility], [address] [place], [place] [address]    Report Status 01/13/2021 FINAL  Final    Radiology Studies: No results found.  Scheduled Meds:  aspirin EC  81 mg Oral Daily   atorvastatin  80 mg Oral Daily   gabapentin  100 mg Oral TID   ketorolac  15 mg Intravenous Q6H   Continuous Infusions:  ceFEPime (MAXIPIME) IV 2 g (01/16/21 1324)   metronidazole 500 mg  (01/16/21 0925)   vancomycin 1,000 mg (01/16/21 1129)     LOS: 5 days    Time spent: 35 MINS    Omnia Dollinger, MD [facility]   If 7PM-7AM, please contact night-coverage

## 2021-01-16 NOTE — Progress Notes (Signed)
Pt refusing blood glucose check at this time, pt is asymptomatic of hypoglycemia and eating well. Primary LPN notified.

## 2021-01-16 NOTE — Progress Notes (Signed)
PT Cancellation Note  Patient Details Name: DAYMIEN GOTH MRN: [id number] DOB: [birth year]   Cancelled Treatment:    Reason Eval/Treat Not Completed: Other (comment). Evaluation attempted, however pt in severe pain and is requesting therapist to re-attempt another time. Will re-attempt in PM.   Eleah Lahaie 01/16/2021, 10:39 AM Elizabeth Palau, PT, DPT [phone]

## 2021-01-17 DIAGNOSIS — E43 Unspecified severe protein-calorie malnutrition: Secondary | ICD-10-CM | POA: Insufficient documentation

## 2021-01-17 LAB — BASIC METABOLIC PANEL
Anion gap: 8 (ref 5–15)
BUN: 11 mg/dL (ref 8–23)
CO2: 24 mmol/L (ref 22–32)
Calcium: 7.6 mg/dL — ABNORMAL LOW (ref 8.9–10.3)
Chloride: 110 mmol/L (ref 98–111)
Creatinine, Ser: 0.73 mg/dL (ref 0.61–1.24)
GFR, Estimated: >60 mL/min (ref >60)
Glucose, Bld: 118 mg/dL — ABNORMAL HIGH (ref 70–99)
Potassium: 3.7 mmol/L (ref 3.5–5.1)
Sodium: 142 mmol/L (ref 135–145)

## 2021-01-17 LAB — CBC
HCT: 22.6 % — ABNORMAL LOW (ref 39.0–52.0)
Hemoglobin: 7 g/dL — ABNORMAL LOW (ref 13.0–17.0)
MCH: 22.6 pg — ABNORMAL LOW (ref 26.0–34.0)
MCHC: 31 g/dL (ref 30.0–36.0)
MCV: 72.9 fL — ABNORMAL LOW (ref 80.0–100.0)
Platelets: 432 10*3/uL — ABNORMAL HIGH (ref 150–400)
RBC: 3.1 MIL/uL — ABNORMAL LOW (ref 4.22–5.81)
RDW: 20.9 % — ABNORMAL HIGH (ref 11.5–15.5)
WBC: 11.2 10*3/uL — ABNORMAL HIGH (ref 4.0–10.5)
nRBC: 0 % (ref 0.0–0.2)

## 2021-01-17 LAB — GLUCOSE, CAPILLARY: Glucose-Capillary: 118 mg/dL — ABNORMAL HIGH (ref 70–99)

## 2021-01-17 LAB — PHOSPHORUS: Phosphorus: 3.4 mg/dL (ref 2.5–4.6)

## 2021-01-17 LAB — MAGNESIUM: Magnesium: 2 mg/dL (ref 1.7–2.4)

## 2021-01-17 LAB — PREPARE RBC (CROSSMATCH)

## 2021-01-17 MED ORDER — SODIUM CHLORIDE 0.9% IV SOLUTION: Freq: Once | INTRAVENOUS | Status: AC

## 2021-01-17 NOTE — Progress Notes (Signed)
PIV consult: attempted lab draw with access. Not enough blood return for lab draw. Pt stated he would allow phlebotomy after 0930. Stated he needs more rest first. Relayed to [place], Charity fundraiser.

## 2021-01-17 NOTE — Progress Notes (Signed)
Patient calm and pleasant post administration of haldol. He received the same dose on 01/14/21 due to aggressive behaviors and the drug was effective. Will continue to monitor.

## 2021-01-17 NOTE — Progress Notes (Signed)
PT Cancellation Note  Patient Details Name: Brad Frost MRN: [id number] DOB: 1955-06-25   Cancelled Treatment:    Reason Eval/Treat Not Completed: Medical issues which prohibited therapy;Patient's level of consciousness (Per chart, pt continues to refuse medical care, has been agitated. Pt pending recommended blood transfusion as well as blood draws, Hb down trending and at 7.0 yesterday.) Will defer to evaluation to later date time once pt's appropriateness can better be determined.   10:03 AM, 01/17/21 Rosamaria Lints, PT, DPT Physical Therapist - [facility]  [phone] (ASCOM)     [name] 01/17/2021, 10:02 AM

## 2021-01-17 NOTE — Progress Notes (Signed)
Patients hemoglobin noted critically low educated on blood transfusion, patient verbally agreed. Hospitalist notified to come and confirm with the patient, he then became angry and refused the transfusion. Patient continued to be impulsive and yelling at staff, multiple attempts to calm patient without change. Hospitalist notified with patients aggressive behaviors, and provided orders for haldol. Vitals and other interventions were refused. Will follow up post administration.

## 2021-01-17 NOTE — Progress Notes (Signed)
PT Cancellation Note  Patient Details Name: Brad Frost MRN: [id number] DOB: 06/21/1955  HPI: Brad Frost is a 65yoM who comes to [facility] on 01/11/21 from [facility] after concerns of progression of grangrenous wound s/p recent Rt foot toe amputation. Pt was here 3 weeks ago and left AMA. Pt seen by vascular this admission who is recommending amputation; similar recommendation for AKA in June at that admission. Pt went to [facility] after May admission, still there at prior admission, now there for LTC. 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 PMH: PVD, DM2, chronic anemia, chronic diabetic foot ulcers, bilat PE no longer on AC.  Cancelled Treatment:    Reason Eval/Treat Not Completed: Other (comment) Pt declines at this time. Chart reviewed, author contacted by attending. Labs now have been done, Hb stable. Author returned to room to attempt evaluation, pt awake in bed, RN at bedside. Pt reports he is not ready to begin PT says "they just took my leg yesterday, can we do this later?" RN/author educate patient on typical timeline for beginning therapy s/p AKA- pt also made aware that the surgeon has already confirmed with treatment team that pt is ready to begin rehab process. Pt says he'll participate tomorrow. Author asks questions to learn patient's goals. Pt previously was quite mobile with a RW which is what he would like to return to. Pt says getting out of bed to chair would be really good tomorrow and wants to do that. Will attempt evaluation again at later date/time.   4:29 PM, 01/17/21 Rosamaria Lints, PT, DPT Physical Therapist - [facility]  [phone] (ASCOM)   Rice C 01/17/2021, 4:23 PM

## 2021-01-17 NOTE — Progress Notes (Signed)
Pt this morning refused blood draw. Educated on why drawing blood, pt refusing blood to be drawn and wanting them to come back later on.

## 2021-01-17 NOTE — TOC Progression Note (Signed)
Transition of Care [facility]) - Progression Note    Patient Details  Name: Brad Frost MRN: [id number] Date of Birth: 12-28-1955  [facility] CM/SW Contact  Ashley Royalty Lutricia Feil, RN Phone Number:[phone] 01/17/2021, 2:48 PM  Clinical Narrative:    I have spoke with [facility] who is willing to take the pt for SNF of care prior to transitioning the pt back to LTC. All information sent via HUB. Spoke with pt concerning options with no other beds offered from all other facility previously sent only [facility] for SNF. Pt states he will not be returning to [facility]. RN inquired on his reasons but pt did not wish to reveal. Encouraged th ept to speak with administration for a formal compliant. States he will talk with his brother concerning other arrangements. RN has attempted an outreach call to the brother [name] [phone]) and left a HIPAA voice message requesting a call back.   TOC will remain available to assist as needed.        Expected Discharge Plan and Services                                                 Social Determinants of Health (SDOH) Interventions    Readmission Risk Interventions No flowsheet data found.

## 2021-01-17 NOTE — Progress Notes (Signed)
2 Days Post-Op   Subjective/Chief Complaint: Patient doing well.  He denies any issues.  His dressings are intact.   Objective: Vital signs in last 24 hours: Temp:  [98 F (36.7 C)-98.5 F (36.9 C)] 98.2 F (36.8 C) (09/24 1044) Pulse Rate:  [86-94] 86 (09/24 1044) Resp:  [16-19] 18 (09/24 1044) BP: (90-102)/(50-63) 93/60 (09/24 1044) SpO2:  [98 %-100 %] 98 % (09/24 1044) Weight:  [69.8 kg] 69.8 kg (09/24 0421) Last BM Date: 01/15/21  Intake/Output from previous day: 09/23 0701 - 09/24 0700 In: 3300 [P.O.:240; I.V.:532.1; IV Piggyback:2527.9] Out: -  Intake/Output this shift: Total I/O In: 240 [P.O.:240] Out: -   General appearance: alert, cooperative, and appears stated age Head: Normocephalic, without obvious abnormality, atraumatic Resp: clear to auscultation bilaterally Cardio: regular rate and rhythm, S1, S2 normal, no murmur, click, rub or gallop Extremities: Left dressing intact, the right stump dressing is intact.  Incision/Wound:  Lab Results:  Recent Labs    01/16/21 0918 01/16/21 1440 01/17/21 0950  WBC 15.4*  --  11.2*  HGB 7.2* 7.0* 7.0*  HCT 23.7* 22.7* 22.6*  PLT 480*  --  432*   BMET Recent Labs    01/16/21 0918 01/17/21 0950  NA 136 142  K 3.7 3.7  CL 108 110  CO2 22 24  GLUCOSE 194* 118*  BUN 9 11  CREATININE 0.81 0.73  CALCIUM 7.6* 7.6*   PT/INR Recent Labs    01/15/21 0405  LABPROT 15.7*  INR 1.3*   ABG No results for input(s): PHART, HCO3 in the last 72 hours.  Invalid input(s): PCO2, PO2  Studies/Results: No results found.  Anti-infectives: Anti-infectives (From admission, onward)    Start     Dose/Rate Route Frequency Ordered Stop   01/16/21 0600  vancomycin (VANCOCIN) IVPB 1000 mg/200 mL premix  Status:  Discontinued        1,000 mg 200 mL/hr over 60 Minutes Intravenous On call to O.R. 01/15/21 1418 01/15/21 1813   01/15/21 1440  ceFAZolin (ANCEF) 2-4 GM/100ML-% IVPB       Note to Pharmacy: Desma Paganini   :  cabinet override      01/15/21 1440 01/16/21 0244   01/15/21 1100  vancomycin (VANCOCIN) IVPB 1000 mg/200 mL premix        1,000 mg 200 mL/hr over 60 Minutes Intravenous Every 12 hours 01/15/21 0731     01/15/21 0000  ceFAZolin (ANCEF) IVPB 2g/100 mL premix  Status:  Discontinued       Note to Pharmacy: To be given in specials   2 g 200 mL/hr over 30 Minutes Intravenous  Once 01/14/21 0908 01/14/21 1100   01/13/21 1700  metroNIDAZOLE (FLAGYL) IVPB 500 mg        500 mg 100 mL/hr over 60 Minutes Intravenous Every 8 hours 01/13/21 1614     01/12/21 1100  vancomycin (VANCOREADY) IVPB 1250 mg/250 mL  Status:  Discontinued        1,250 mg 166.7 mL/hr over 90 Minutes Intravenous Every 12 hours 01/11/21 2146 01/15/21 0731   01/12/21 0530  ceFEPIme (MAXIPIME) 2 g in sodium chloride 0.9 % 100 mL IVPB        2 g 200 mL/hr over 30 Minutes Intravenous Every 8 hours 01/11/21 2146     01/11/21 2145  vancomycin (VANCOREADY) IVPB 750 mg/150 mL        750 mg 150 mL/hr over 60 Minutes Intravenous  Once 01/11/21 2141 01/12/21 0107   01/11/21 2045  ceFEPIme (MAXIPIME) 2 g in sodium chloride 0.9 % 100 mL IVPB        2 g 200 mL/hr over 30 Minutes Intravenous  Once 01/11/21 2036 01/11/21 2204   01/11/21 2045  vancomycin (VANCOCIN) IVPB 1000 mg/200 mL premix        1,000 mg 200 mL/hr over 60 Minutes Intravenous  Once 01/11/21 2036 01/11/21 2346       Assessment/Plan: s/p Procedure(s): AMPUTATION ABOVE KNEE (Right) I will remove right stump dressing tomorrow. Continue will all current care PT/OT  LOS: 6 days    Louisa Second 01/17/2021

## 2021-01-17 NOTE — Progress Notes (Signed)
[facility] PROGRESS NOTE    Brad Frost  UEA:[id number] DOB: [birth year] DOA: 01/11/2021 PCP: [name], [name], MD   Brief Narrative:  This 65 y.o. male with PMH significant for DM, history of osteomyelitis, anemia, tobacco use presented from home to the ED on 01/11/2021 with right foot pain x several weeks. In the ED, he was found that he had osteomyelitis and was treated with IV antibiotics and vascular surgery was consulted.  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 Patient presented with chronic right foot pain and wound.  Bx Cx NG x 4 days.  Afebrile. started on IV antibiotics, vascular surgery was consulted. Plan: --d/c abx today, per vascular approval --dressing change and wound check, per vascular  Microcytic hypochromic anemia Iron def Patient has severe PAD, transfusion threshold 8.0 Plan: --1u pRBC today for Hgb 7  Type 2 diabetes: Diet controlled, No RISS Check CBGs with labs   History of PE: Xarelto is on hold.  Tobacco dependence:   Counseled on quit smoking which will help in wound healing  BPH: Flomax has been held because of hypotension.    DVT prophylaxis: SCDs Code Status: Full code Family Communication: family updated at bedside today  Disposition Plan:   Status is: Inpatient  Dispo: The patient is from: long-term SNF              Anticipated d/c is to: long-term SNF               Patient currently is not medically stable to d/c.   Difficult to place patient No   Consultants:  Vascular surgery.  Procedures: Right  above-knee amputation. Antimicrobials:    Anti-infectives (From admission, onward)    Start     Dose/Rate Route Frequency Ordered Stop   01/16/21 0600  vancomycin (VANCOCIN) IVPB 1000 mg/200 mL premix  Status:  Discontinued        1,000 mg 200 mL/hr over 60 Minutes Intravenous On call to O.R. 01/15/21 1418 01/15/21 1813   01/15/21 1440  ceFAZolin (ANCEF) 2-4 GM/100ML-% IVPB       Note to Pharmacy: Desma Paganini   : cabinet override      01/15/21 1440 01/16/21 0244   01/15/21 1100  vancomycin (VANCOCIN) IVPB 1000 mg/200 mL premix  Status:  Discontinued        1,000 mg 200 mL/hr over 60 Minutes Intravenous Every 12 hours 01/15/21 0731 01/17/21 1329   01/15/21 0000  ceFAZolin (ANCEF) IVPB 2g/100 mL premix  Status:  Discontinued       Note to Pharmacy: To be given in specials   2 g 200 mL/hr over 30 Minutes Intravenous  Once 01/14/21 0908 01/14/21 1100   01/13/21 1700  metroNIDAZOLE (FLAGYL) IVPB 500 mg        500 mg 100 mL/hr over 60 Minutes Intravenous Every 8 hours 01/13/21 1614     01/12/21 1100  vancomycin (VANCOREADY) IVPB 1250 mg/250 mL  Status:  Discontinued        1,250 mg 166.7 mL/hr over 90 Minutes Intravenous Every 12 hours 01/11/21 2146 01/15/21 0731  01/12/21 0530  ceFEPIme (MAXIPIME) 2 g in sodium chloride 0.9 % 100 mL IVPB  Status:  Discontinued        2 g 200 mL/hr over 30 Minutes Intravenous Every 8 hours 01/11/21 2146 01/17/21 1329   01/11/21 2145  vancomycin (VANCOREADY) IVPB 750 mg/150 mL        750 mg 150 mL/hr over 60 Minutes Intravenous  Once 01/11/21 2141 01/12/21 0107   01/11/21 2045  ceFEPIme (MAXIPIME) 2 g in sodium chloride 0.9 % 100 mL IVPB        2 g 200 mL/hr over 30 Minutes Intravenous  Once 01/11/21 2036 01/11/21 2204   01/11/21 2045  vancomycin (VANCOCIN) IVPB 1000 mg/200 mL premix        1,000 mg 200 mL/hr over 60 Minutes Intravenous  Once 01/11/21 2036 01/11/21 2346      Subjective: Pt reported normal oral intake.     Objective: Vitals:    01/17/21 0806 01/17/21 1006 01/17/21 1044 01/17/21 1348  BP: (!) 95/57 (!) 90/50 93/60 (!) 105/59  Pulse: 91 90 86 93  Resp: 16 18 18 18   Temp: 98 F (36.7 C) 98.5 F (36.9 C) 98.2 F (36.8 C) 98.7 F (37.1 C)  TempSrc: Oral Oral Oral Oral  SpO2: 100% 98% 98% 100%  Weight:      Height:        Intake/Output Summary (Last 24 hours) at 01/17/2021 1614 Last data filed at 01/17/2021 1500 Gross per 24 hour  Intake 2269.3 ml  Output --  Net 2269.3 ml   Filed Weights   01/15/21 1419 01/16/21 0500 01/17/21 0421  Weight: 77.2 kg 72.6 kg 69.8 kg    Examination:  Constitutional: NAD, alert, oriented to person and place HEENT: conjunctivae and lids normal, EOMI CV: No cyanosis.   RESP: normal respiratory effort, on RA Extremities: No effusions, edema in LLE.  Right AKA with surgical dressing on. SKIN: warm, dry Neuro: II - XII grossly intact.     Data Reviewed: I have personally reviewed following labs and imaging studies  CBC: Recent Labs  Lab 01/11/21 1951 01/12/21 0818 01/13/21 1542 01/14/21 0438 01/15/21 0405 01/16/21 0918 01/16/21 1440 01/17/21 0950  WBC 11.0* 10.4 11.9* 11.1* 9.6 15.4*  --  11.2*  NEUTROABS 7.7 7.4  --   --   --   --   --   --   HGB 9.1* 8.0* 7.8* 7.7* 7.9* 7.2* 7.0* 7.0*  HCT 29.5* 25.8* 24.0* 24.2* 25.9* 23.7* 22.7* 22.6*  MCV 73.8* 71.1* 70.4* 71.0* 71.2* 71.4*  --  72.9*  PLT 476* 422* 400 431* 445* 480*  --  432*   Basic Metabolic Panel: Recent Labs  Lab 01/11/21 1936 01/11/21 1951 01/12/21 0818 01/13/21 1542 01/13/21 1728 01/14/21 0438 01/15/21 0405 01/16/21 0918 01/17/21 0950  NA  --    < > 137   < > 134* 136 136 136 142  K  --    < > 3.8   < > 3.4* 3.1* 3.9 3.7 3.7  CL  --    < > 106   < > 106 107 108 108 110  CO2  --    < > 23   < > 22 22 23 22 24   GLUCOSE  --    < > 89   < > 82 72 77 194* 118*  BUN  --    < > 9   < > 9 9 9 9 11   CREATININE  --    < >  0.62   < > 0.74 0.68 0.66 0.81 0.73  CALCIUM  --    < > 7.9*   < > 7.5*  7.6* 7.8* 7.6* 7.6*  MG 2.1  --  1.7  --   --   --  1.8  --  2.0  PHOS  --   --   --   --   --   --  2.8  --  3.4   < > = values in this interval not displayed.   GFR: Estimated Creatinine Clearance: 88.5 mL/min (by C-G formula based on SCr of 0.73 mg/dL). Liver Function Tests: Recent Labs  Lab 01/11/21 1951 01/12/21 0818  AST 24 19  ALT 27 20  ALKPHOS 87 68  BILITOT 0.5 0.7  PROT 6.3* 5.2*  ALBUMIN 2.2* 1.8*   No results for input(s): LIPASE, AMYLASE in the last 168 hours. No results for input(s): AMMONIA in the last 168 hours. Coagulation Profile: Recent Labs  Lab 01/11/21 1951 01/12/21 0818 01/15/21 0405  INR 1.1 1.1 1.3*   Cardiac Enzymes: No results for input(s): CKTOTAL, CKMB, CKMBINDEX, TROPONINI in the last 168 hours. BNP (last 3 results) No results for input(s): PROBNP in the last 8760 hours. HbA1C: No results for input(s): HGBA1C in the last 72 hours. CBG: Recent Labs  Lab 01/15/21 1419 01/15/21 1702 01/15/21 2159 01/16/21 0821 01/16/21 2212  GLUCAP 100* 113* 266* 136* 148*   Lipid Profile: No results for input(s): CHOL, HDL, LDLCALC, TRIG, CHOLHDL, LDLDIRECT in the last 72 hours. Thyroid Function Tests: No results for input(s): TSH, T4TOTAL, FREET4, T3FREE, THYROIDAB in the last 72 hours. Anemia Panel: No results for input(s): VITAMINB12, FOLATE, FERRITIN, TIBC, IRON, RETICCTPCT in the last 72 hours. Sepsis Labs: Recent Labs  Lab 01/11/21 1846 01/11/21 1938 01/13/21 1728  LATICACIDVEN 1.6 1.6 0.8    Recent Results (from the past 240 hour(s))  Blood culture (routine single)     Status: None   Collection Time: 01/11/21  7:51 PM   Specimen: BLOOD  Result Value Ref Range Status   Specimen Description BLOOD LEFT ANTECUBITAL  Final   Special Requests   Final    BOTTLES DRAWN AEROBIC AND ANAEROBIC Blood Culture adequate volume   Culture   Final    NO GROWTH 5 DAYS Performed at [facility], [address] [place], [place]  [address]    Report Status 01/16/2021 FINAL  Final  Resp Panel by RT-PCR (Flu A&B, Covid) Nasopharyngeal Swab     Status: None   Collection Time: 01/11/21  9:35 PM   Specimen: Nasopharyngeal Swab; Nasopharyngeal(NP) swabs in vial transport medium  Result Value Ref Range Status   SARS Coronavirus 2 by RT PCR NEGATIVE NEGATIVE Final    Comment: (NOTE) SARS-CoV-2 target nucleic acids are NOT DETECTED.  The SARS-CoV-2 RNA is generally detectable in upper respiratory specimens during the acute phase of infection. The lowest concentration of SARS-CoV-2 viral copies this assay can detect is 138 copies/mL. A negative result does not preclude SARS-Cov-2 infection and should not be used as the sole basis for treatment or other patient management decisions. A negative result may occur with  improper specimen collection/handling, submission of specimen other than nasopharyngeal swab, presence of viral mutation(s) within the areas targeted by this assay, and inadequate number of viral copies(<138 copies/mL). A negative result must be combined with clinical observations, patient history, and epidemiological information. The expected result is Negative.  Fact Sheet for Patients:  BloggerCourse.com  Fact Sheet for Healthcare  Providers:  SeriousBroker.it  This test is no t yet approved or cleared by the [place] and  has been authorized for detection and/or diagnosis of SARS-CoV-2 by FDA under an Emergency Use Authorization (EUA). This EUA will remain  in effect (meaning this test can be used) for the duration of the COVID-19 declaration under Section 564(b)(1) of the Act, 21 U.S.C.section 360bbb-3(b)(1), unless the authorization is terminated  or revoked sooner.       Influenza A by PCR NEGATIVE NEGATIVE Final   Influenza B by PCR NEGATIVE NEGATIVE Final    Comment: (NOTE) The Xpert Xpress SARS-CoV-2/FLU/RSV plus assay is intended as an  aid in the diagnosis of influenza from Nasopharyngeal swab specimens and should not be used as a sole basis for treatment. Nasal washings and aspirates are unacceptable for Xpert Xpress SARS-CoV-2/FLU/RSV testing.  Fact Sheet for Patients: BloggerCourse.com  Fact Sheet for Healthcare Providers: SeriousBroker.it  This test is not yet approved or cleared by the [place] FDA and has been authorized for detection and/or diagnosis of SARS-CoV-2 by FDA under an Emergency Use Authorization (EUA). This EUA will remain in effect (meaning this test can be used) for the duration of the COVID-19 declaration under Section 564(b)(1) of the Act, 21 U.S.C. section 360bbb-3(b)(1), unless the authorization is terminated or revoked.  Performed at [facility], [address], [place], [place] [address]   Urine Culture     Status: None   Collection Time: 01/12/21  7:47 AM   Specimen: In/Out Cath Urine  Result Value Ref Range Status   Specimen Description   Final    IN/OUT CATH URINE Performed at [facility], [address], [place], [place] [address]    Special Requests   Final    NONE Performed at [facility], [address] [place], [place] [address]    Culture   Final    NO GROWTH Performed at [facility], [address], [place], [place] [address]    Report Status 01/13/2021 FINAL  Final    Radiology Studies: No results found.  Scheduled Meds:  aspirin EC  81 mg Oral Daily   atorvastatin  80 mg Oral Daily   feeding supplement  237 mL Oral TID BM   gabapentin  100 mg Oral TID   multivitamin with minerals  1 tablet Oral Daily   Continuous Infusions:  metronidazole 500 mg (01/17/21 1609)     LOS: 6 days     Darlin Priestly, MD [facility]   If 7PM-7AM, please contact night-coverage

## 2021-01-18 LAB — BASIC METABOLIC PANEL
Anion gap: 8 (ref 5–15)
BUN: 10 mg/dL (ref 8–23)
CO2: 24 mmol/L (ref 22–32)
Calcium: 8 mg/dL — ABNORMAL LOW (ref 8.9–10.3)
Chloride: 106 mmol/L (ref 98–111)
Creatinine, Ser: 0.6 mg/dL — ABNORMAL LOW (ref 0.61–1.24)
GFR, Estimated: >60 mL/min (ref >60)
Glucose, Bld: 90 mg/dL (ref 70–99)
Potassium: 4 mmol/L (ref 3.5–5.1)
Sodium: 138 mmol/L (ref 135–145)

## 2021-01-18 LAB — CBC
HCT: 27.9 % — ABNORMAL LOW (ref 39.0–52.0)
Hemoglobin: 8.6 g/dL — ABNORMAL LOW (ref 13.0–17.0)
MCH: 22.2 pg — ABNORMAL LOW (ref 26.0–34.0)
MCHC: 30.8 g/dL (ref 30.0–36.0)
MCV: 71.9 fL — ABNORMAL LOW (ref 80.0–100.0)
Platelets: 475 10*3/uL — ABNORMAL HIGH (ref 150–400)
RBC: 3.88 MIL/uL — ABNORMAL LOW (ref 4.22–5.81)
RDW: 21.3 % — ABNORMAL HIGH (ref 11.5–15.5)
WBC: 13.3 10*3/uL — ABNORMAL HIGH (ref 4.0–10.5)
nRBC: 0.2 % (ref 0.0–0.2)

## 2021-01-18 LAB — TYPE AND SCREEN
ABO/RH(D): O POS
Antibody Screen: NEGATIVE
Unit division: 0
Unit division: 0

## 2021-01-18 LAB — BPAM RBC
Blood Product Expiration Date: 202210172359
Blood Product Expiration Date: 202210172359
ISSUE DATE / TIME: 202209241022
Unit Type and Rh: 5100
Unit Type and Rh: 5100

## 2021-01-18 LAB — MAGNESIUM: Magnesium: 1.8 mg/dL (ref 1.7–2.4)

## 2021-01-18 MED ORDER — ENOXAPARIN SODIUM 40 MG/0.4ML IJ SOSY
40.0000 mg | PREFILLED_SYRINGE | INTRAMUSCULAR | Status: DC
  Filled 2021-01-18: qty 0.4

## 2021-01-18 MED ORDER — HALOPERIDOL LACTATE 5 MG/ML IJ SOLN
2.0000 mg | Freq: Four times a day (QID) | INTRAMUSCULAR | Status: DC | PRN
  Administered 2021-01-19: 2 mg via INTRAMUSCULAR
  Filled 2021-01-18: qty 1

## 2021-01-18 MED ORDER — SODIUM CHLORIDE 0.9 % IV SOLN
250.0000 mg | Freq: Once | INTRAVENOUS | Status: AC
  Administered 2021-01-18: 250 mg via INTRAVENOUS
  Filled 2021-01-18: qty 20

## 2021-01-18 NOTE — Progress Notes (Signed)
3 Days Post-Op   Subjective/Chief Complaint: Patient is doing well and denies any pain.  He is ready have his dressing removed.    Objective: Vital signs in last 24 hours: Temp:  [98.7 F (37.1 C)-99 F (37.2 C)] 99 F (37.2 C) (09/25 0809) Pulse Rate:  [90-97] 97 (09/25 0809) Resp:  [15-18] 15 (09/25 0809) BP: (100-120)/(59-69) 120/69 (09/25 0809) SpO2:  [96 %-100 %] 98 % (09/25 0809) Last BM Date: 01/15/21  Intake/Output from previous day: 09/24 0701 - 09/25 0700 In: 739.8 [P.O.:240; Blood:398; IV Piggyback:101.8] Out: 500 [Urine:500] Intake/Output this shift: Total I/O In: 34.1 [IV Piggyback:34.1] Out: 875 [Urine:875]  General appearance: alert, cooperative, appears stated age, and no distress Head: Normocephalic, without obvious abnormality, atraumatic Neck: no adenopathy, no carotid bruit, no JVD, supple, symmetrical, trachea midline, and thyroid not enlarged, symmetric, no tenderness/mass/nodules Resp: clear to auscultation bilaterally Cardio: regular rate and rhythm, S1, S2 normal, no murmur, click, rub or gallop Incision/Wound: Right stump dressing removed.  Wound clean and in tacked.  Will dress with Neosporin/ 4x4 gauze and hypafix tape.  Lab Results:  Recent Labs    01/17/21 0950 01/18/21 0515  WBC 11.2* 13.3*  HGB 7.0* 8.6*  HCT 22.6* 27.9*  PLT 432* 475*   BMET Recent Labs    01/17/21 0950 01/18/21 0515  NA 142 138  K 3.7 4.0  CL 110 106  CO2 24 24  GLUCOSE 118* 90  BUN 11 10  CREATININE 0.73 0.60*  CALCIUM 7.6* 8.0*   PT/INR No results for input(s): LABPROT, INR in the last 72 hours. ABG No results for input(s): PHART, HCO3 in the last 72 hours.  Invalid input(s): PCO2, PO2  Studies/Results: No results found.  Anti-infectives: Anti-infectives (From admission, onward)    Start     Dose/Rate Route Frequency Ordered Stop   01/16/21 0600  vancomycin (VANCOCIN) IVPB 1000 mg/200 mL premix  Status:  Discontinued        1,000 mg 200  mL/hr over 60 Minutes Intravenous On call to O.R. 01/15/21 1418 01/15/21 1813   01/15/21 1440  ceFAZolin (ANCEF) 2-4 GM/100ML-% IVPB       Note to Pharmacy: Desma Paganini   : cabinet override      01/15/21 1440 01/16/21 0244   01/15/21 1100  vancomycin (VANCOCIN) IVPB 1000 mg/200 mL premix  Status:  Discontinued        1,000 mg 200 mL/hr over 60 Minutes Intravenous Every 12 hours 01/15/21 0731 01/17/21 1329   01/15/21 0000  ceFAZolin (ANCEF) IVPB 2g/100 mL premix  Status:  Discontinued       Note to Pharmacy: To be given in specials   2 g 200 mL/hr over 30 Minutes Intravenous  Once 01/14/21 0908 01/14/21 1100   01/13/21 1700  metroNIDAZOLE (FLAGYL) IVPB 500 mg  Status:  Discontinued        500 mg 100 mL/hr over 60 Minutes Intravenous Every 8 hours 01/13/21 1614 01/18/21 0709   01/12/21 1100  vancomycin (VANCOREADY) IVPB 1250 mg/250 mL  Status:  Discontinued        1,250 mg 166.7 mL/hr over 90 Minutes Intravenous Every 12 hours 01/11/21 2146 01/15/21 0731   01/12/21 0530  ceFEPIme (MAXIPIME) 2 g in sodium chloride 0.9 % 100 mL IVPB  Status:  Discontinued        2 g 200 mL/hr over 30 Minutes Intravenous Every 8 hours 01/11/21 2146 01/17/21 1329   01/11/21 2145  vancomycin (VANCOREADY) IVPB 750 mg/150 mL  750 mg 150 mL/hr over 60 Minutes Intravenous  Once 01/11/21 2141 01/12/21 0107   01/11/21 2045  ceFEPIme (MAXIPIME) 2 g in sodium chloride 0.9 % 100 mL IVPB        2 g 200 mL/hr over 30 Minutes Intravenous  Once 01/11/21 2036 01/11/21 2204   01/11/21 2045  vancomycin (VANCOCIN) IVPB 1000 mg/200 mL premix        1,000 mg 200 mL/hr over 60 Minutes Intravenous  Once 01/11/21 2036 01/11/21 2346       Assessment/Plan: s/p Procedure(s): AMPUTATION ABOVE KNEE (Right) Discharge Okay to plan discharge to rehabilitation.   LOS: 7 days    Louisa Second 01/18/2021

## 2021-01-18 NOTE — TOC Progression Note (Signed)
Transition of Care [facility]) - Progression Note    Patient Details  Name: SUREN PAYNE MRN: [id number] Date of Birth: 07-Apr-1956  [facility] CM/SW [place], RN Phone Number: 01/18/2021, 9:43 AM  Clinical Narrative:   I met with the patient and explained to him that I have sent the bed search out to multiple facilities looking for a new STR transitioning to LTC facility, I explained that there are no bed offers other than [facility], He stated that he did not want to go to [facility], I explained that due to him residing there if we do not have other offers prior to his DC then the only option would be to return to [facility], They do have a SW that can assist in locating a new LTC facility if he chooses,  He stated understanding and stated that he would have his family call around to see if they could find another facility, He will let me know what the outcome is but understands once he is medically stable he will DC to [facility] unless other offers arise before.         Expected Discharge Plan and Services                                                 Social Determinants of Health (SDOH) Interventions    Readmission Risk Interventions No flowsheet data found.

## 2021-01-18 NOTE — Evaluation (Signed)
Physical Therapy Evaluation Patient Details Name: Brad Frost MRN: [id number] DOB: June 25, 1955 Today's Date: 01/18/2021  History of Present Illness  Brad Frost is a 65yoM who comes to [facility] on 01/11/21 from [facility] after concerns of progression of grangrenous wound s/p recent Rt foot toe amputation. Pt was here 3 weeks ago and left AMA. Pt seen by vascular this admission who is recommending amputation; similar recommendation for AKA in June at that admission. Pt went to [facility] after May admission, still there at prior admission, now there for LTC. 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 PMH: PVD, DM2, chronic anemia, chronic diabetic foot ulcers, bilat PE no longer on AC.  Clinical Impression  Spoke with nurse throughout the day as pt requested to reschedule PT tx 3 days throughout the day. PT entered room with nurse & provided MAX education/encouragement for participation as pt initially unwilling to participate. Eventually pt agreeable & requires max assist +2 for supine>sit EOB. Pt is very tall & requires EOB elevated for successful sit>stand with +2 assist with RW. Pt completes stand pivot to recliner with inability to clear LLE to take step & great difficulty shuffling foot along to pivot. Pt seems please once in chair & pt left in care of nurse with MD in room.   Will attempt to see pt 7x/week per therapy protocols but if pt continues to demonstrate decreased willingness to participate, will reduce frequency accordingly.      Recommendations for follow up therapy are one component of a multi-disciplinary discharge planning process, led by the attending physician.  Recommendations may be updated based on patient status, additional functional criteria and insurance authorization.  Follow Up Recommendations SNF;Supervision/Assistance - 24 hour    Equipment Recommendations  Rolling walker with 5" wheels;3in1 (PT);Hospital bed;Wheelchair (measurements PT);Wheelchair cushion (measurements  PT)    Recommendations for Other Services       Precautions / Restrictions Precautions Precautions: Fall Precaution Comments: new R AKA Restrictions Weight Bearing Restrictions: Yes RLE Weight Bearing: Non weight bearing      Mobility  Bed Mobility Overal bed mobility: Needs Assistance Bed Mobility: Supine to Sit     Supine to sit: +2 for physical assistance;Max assist;HOB elevated     General bed mobility comments: bed rails    Transfers Overall transfer level: Needs assistance Equipment used: Rolling walker (2 wheeled) Transfers: Sit to/from [employer] Sit to Stand: Mod assist;Max assist;+2 physical assistance;From elevated surface Stand pivot transfers: Max assist;+2 physical assistance       General transfer comment: Pt completes sit>stand from elevated EOB with +2 assist, requires +2 assist for stand pivot bed>recliner with cuing but poor demo of upright standing posture. Pt unable to clear LLE from floor to take step & also has difficulty shuffling his foot along floor. Requires cuing to reach back for armrests for stand>sit.  Ambulation/Gait                Stairs            Wheelchair Mobility    Modified Rankin (Stroke Patients Only)       Balance Overall balance assessment: Needs assistance Sitting-balance support: Feet supported;Bilateral upper extremity supported Sitting balance-Leahy Scale: Fair     Standing balance support: During functional activity;No upper extremity supported Standing balance-Leahy Scale: Poor Standing balance comment: BUE support on RW & +2  Pertinent Vitals/Pain Pain Assessment: Faces Faces Pain Scale: Hurts little more Pain Location: RLE with bed mobility Pain Descriptors / Indicators: Grimacing;Discomfort Pain Intervention(s): Monitored during session;Repositioned    Home Living Family/patient expects to be discharged to:: Skilled nursing  facility                      Prior Function           Comments: Unsure, pt doesn't provide at time of evaluation. Per chart, pt was ambulatory with RW at one point.     Hand Dominance        Extremity/Trunk Assessment   Upper Extremity Assessment Upper Extremity Assessment: Generalized weakness    Lower Extremity Assessment Lower Extremity Assessment: Generalized weakness (Nurse reports very dry skin on LLE, did not assess RLE 2/2 new R AKA)       Communication      Cognition Arousal/Alertness: Awake/alert Behavior During Therapy: Agitated Overall Cognitive Status: Difficult to assess                                 General Comments: Pt agitated, pt elected to re-schedule PT evaluation 3x on this date. PT & Nurse provied MAX encouragement/education re: need for participation on this date. Pt irritable, cursing at times during session. Appears to have poor insight but did not attempt to glean orientation level from pt to not further irritate pt.      General Comments      Exercises     Assessment/Plan    PT Assessment Patient needs continued PT services  PT Problem List Decreased strength;Decreased mobility;Decreased safety awareness;Decreased range of motion;Decreased knowledge of precautions;Decreased activity tolerance;Decreased cognition;Decreased balance;Decreased knowledge of use of DME;Pain       PT Treatment Interventions DME instruction;Therapeutic exercise;Balance training;Gait training;Stair training;Neuromuscular re-education;Functional mobility training;Cognitive remediation;Therapeutic activities;Patient/family education;Modalities;Manual techniques    PT Goals (Current goals can be found in the Care Plan section)  Acute Rehab PT Goals Patient Stated Goal: none stated PT Goal Formulation: With patient Time For Goal Achievement: 02/01/21 Potential to Achieve Goals: Fair    Frequency 7X/week   Barriers to discharge         Co-evaluation               AM-PAC PT "6 Clicks" Mobility  Outcome Measure Help needed turning from your back to your side while in a flat bed without using bedrails?: A Lot Help needed moving from lying on your back to sitting on the side of a flat bed without using bedrails?: Total Help needed moving to and from a bed to a chair (including a wheelchair)?: Total Help needed standing up from a chair using your arms (e.g., wheelchair or bedside chair)?: Total Help needed to walk in hospital room?: Total Help needed climbing 3-5 steps with a railing? : Total 6 Click Score: 7    End of Session Equipment Utilized During Treatment: Gait belt Activity Tolerance: Treatment limited secondary to agitation;Patient tolerated treatment well Patient left: in chair;with nursing/sitter in room Nurse Communication: Mobility status PT Visit Diagnosis: Unsteadiness on feet (R26.81);Muscle weakness (generalized) (M62.81);Difficulty in walking, not elsewhere classified (R26.2)    Time: 6237-6283 PT Time Calculation (min) (ACUTE ONLY): 16 min   Charges:   PT Evaluation $PT Eval Moderate Complexity: 1 Mod PT Treatments $Therapeutic Activity: 8-22 mins        Aleda Grana, PT, DPT 01/18/21, 2:02 PM  Sandi Mariscal 01/18/2021, 1:59 PM

## 2021-01-18 NOTE — Progress Notes (Signed)
Patient alert and oriented x 4, denies pain to surgical site. Dressing clean, dry and intact. Vitals stable, no adverse events during shift. Patient compliant with all care. Will continue to monitor.

## 2021-01-18 NOTE — Progress Notes (Signed)
[facility] PROGRESS NOTE    Brad Frost  ZOX:[id number] DOB: 1956/03/25 DOA: 01/11/2021 PCP: [name], [name], MD   Brief Narrative:  Brad Frost is a 65 y.o. male with PMH significant for DM, history of osteomyelitis, anemia, tobacco use presented from home to the ED on 01/11/2021 with right foot pain x several weeks. In the ED, he was found that he had osteomyelitis and was treated with IV antibiotics and vascular surgery was consulted.  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 Wyn Quaker Patient presented with chronic right foot pain and wound.  Bx Cx NG x 4 days.  Afebrile. started on IV antibiotics, which were d/c'ed 2 days after amputation. Plan: --wound check and dressing change today by vascular surgery  Microcytic hypochromic anemia Iron def Patient has severe PAD, transfusion threshold 8.0 --s/p 1u pRBC for Hgb 7 Plan: --IV iron today --discharge on oral iron supplement  Type 2 diabetes: Diet controlled.  Last A1c 5.7. --no need for insulin   History of PE: --Pt currently not taking Xarelto  Tobacco dependence:   Counseled on quit smoking which will help in wound healing  BPH: --Hold Flomax 2/2 soft BP.  Can resume if BP increases.   DVT prophylaxis: lovenox Code Status: Full code Family Communication:  Disposition Plan:   Status is: Inpatient  Dispo: The patient is from: long-term SNF              Anticipated d/c is to: long-term SNF with rehab               Patient currently is medically stable to d/c.  Per TOC, [facility] should be  able to take pt back tomorrow.  Pt doesn't want to return back to [facility].  Explained to pt that if there is no other bed offers, then he needs to return there.  Anticipate difficulty discharging if pt refuses.   Consultants:  Vascular surgery.  Procedures: Right above-knee amputation. Antimicrobials:    Anti-infectives (From admission, onward)    Start     Dose/Rate Route Frequency Ordered Stop   01/16/21 0600  vancomycin (VANCOCIN) IVPB 1000 mg/200 mL premix  Status:  Discontinued        1,000 mg 200 mL/hr over 60 Minutes Intravenous On call to O.R. 01/15/21 1418 01/15/21 1813   01/15/21 1440  ceFAZolin (ANCEF) 2-4 GM/100ML-% IVPB       Note to Pharmacy: Desma Paganini   : cabinet override      01/15/21 1440 01/16/21 0244   01/15/21 1100  vancomycin (VANCOCIN) IVPB 1000 mg/200 mL premix  Status:  Discontinued        1,000 mg 200 mL/hr over 60 Minutes Intravenous Every 12 hours 01/15/21 0731 01/17/21 1329   01/15/21 0000  ceFAZolin (ANCEF) IVPB 2g/100 mL premix  Status:  Discontinued       Note to Pharmacy: To be given in specials   2 g 200 mL/hr over 30 Minutes Intravenous  Once 01/14/21 0908 01/14/21 1100   01/13/21 1700  metroNIDAZOLE (FLAGYL) IVPB 500 mg  Status:  Discontinued  500 mg 100 mL/hr over 60 Minutes Intravenous Every 8 hours 01/13/21 1614 01/18/21 0709   01/12/21 1100  vancomycin (VANCOREADY) IVPB 1250 mg/250 mL  Status:  Discontinued        1,250 mg 166.7 mL/hr over 90 Minutes Intravenous Every 12 hours 01/11/21 2146 01/15/21 0731   01/12/21 0530  ceFEPIme (MAXIPIME) 2 g in sodium chloride 0.9 % 100 mL IVPB  Status:  Discontinued        2 g 200 mL/hr over 30 Minutes Intravenous Every 8 hours 01/11/21 2146 01/17/21 1329   01/11/21 2145  vancomycin (VANCOREADY) IVPB 750 mg/150 mL        750 mg 150 mL/hr over 60 Minutes Intravenous  Once 01/11/21 2141 01/12/21 0107   01/11/21 2045  ceFEPIme (MAXIPIME) 2 g in sodium chloride 0.9 % 100 mL IVPB        2 g 200 mL/hr  over 30 Minutes Intravenous  Once 01/11/21 2036 01/11/21 2204   01/11/21 2045  vancomycin (VANCOCIN) IVPB 1000 mg/200 mL premix        1,000 mg 200 mL/hr over 60 Minutes Intravenous  Once 01/11/21 2036 01/11/21 2346      Subjective: Pt was up with PT today.  Both TOC and I explained to pt that he would have to go back to the SNF he came from if he has no other bed offers.     Objective: Vitals:   01/17/21 2311 01/18/21 0604 01/18/21 0809 01/18/21 1210  BP: 100/66 109/69 120/69 101/62  Pulse: 90 94 97 87  Resp: 17 18 15 15   Temp: 98.8 F (37.1 C) 99 F (37.2 C) 99 F (37.2 C) 98.6 F (37 C)  TempSrc:   Oral Oral  SpO2: 98% 96% 98% 96%  Weight:      Height:        Intake/Output Summary (Last 24 hours) at 01/18/2021 1531 Last data filed at 01/18/2021 1010 Gross per 24 hour  Intake 34.12 ml  Output 1375 ml  Net -1340.88 ml   Filed Weights   01/15/21 1419 01/16/21 0500 01/17/21 0421  Weight: 77.2 kg 72.6 kg 69.8 kg    Examination:  Constitutional: NAD, AAOx3 HEENT: conjunctivae and lids normal, EOMI CV: No cyanosis.   RESP: normal respiratory effort, on RA Extremities: LLE wrapped in gauze.  R AKA stump site wrapped in ACE wrap SKIN: warm, dry Neuro: II - XII grossly intact.   Psych: labile mood and affect.     Data Reviewed: I have personally reviewed following labs and imaging studies  CBC: Recent Labs  Lab 01/11/21 1951 01/12/21 0818 01/13/21 1542 01/14/21 0438 01/15/21 0405 01/16/21 0918 01/16/21 1440 01/17/21 0950 01/18/21 0515  WBC 11.0* 10.4   < > 11.1* 9.6 15.4*  --  11.2* 13.3*  NEUTROABS 7.7 7.4  --   --   --   --   --   --   --   HGB 9.1* 8.0*   < > 7.7* 7.9* 7.2* 7.0* 7.0* 8.6*  HCT 29.5* 25.8*   < > 24.2* 25.9* 23.7* 22.7* 22.6* 27.9*  MCV 73.8* 71.1*   < > 71.0* 71.2* 71.4*  --  72.9* 71.9*  PLT 476* 422*   < > 431* 445* 480*  --  432* 475*   < > = values in this interval not displayed.   Basic Metabolic Panel: Recent Labs  Lab  01/11/21 1936 01/11/21 1951 01/12/21 0818 01/13/21 1542 01/14/21 01/16/21 01/15/21 0405 01/16/21 01/18/21 01/17/21 01/19/21  01/18/21 0515  NA  --    < > 137   < > 136 136 136 142 138  K  --    < > 3.8   < > 3.1* 3.9 3.7 3.7 4.0  CL  --    < > 106   < > 107 108 108 110 106  CO2  --    < > 23   < > 22 23 22 24 24   GLUCOSE  --    < > 89   < > 72 77 194* 118* 90  BUN  --    < > 9   < > 9 9 9 11 10   CREATININE  --    < > 0.62   < > 0.68 0.66 0.81 0.73 0.60*  CALCIUM  --    < > 7.9*   < > 7.6* 7.8* 7.6* 7.6* 8.0*  MG 2.1  --  1.7  --   --  1.8  --  2.0 1.8  PHOS  --   --   --   --   --  2.8  --  3.4  --    < > = values in this interval not displayed.   GFR: Estimated Creatinine Clearance: 88.5 mL/min (A) (by C-G formula based on SCr of 0.6 mg/dL (L)). Liver Function Tests: Recent Labs  Lab 01/11/21 1951 01/12/21 0818  AST 24 19  ALT 27 20  ALKPHOS 87 68  BILITOT 0.5 0.7  PROT 6.3* 5.2*  ALBUMIN 2.2* 1.8*   No results for input(s): LIPASE, AMYLASE in the last 168 hours. No results for input(s): AMMONIA in the last 168 hours. Coagulation Profile: Recent Labs  Lab 01/11/21 1951 01/12/21 0818 01/15/21 0405  INR 1.1 1.1 1.3*   Cardiac Enzymes: No results for input(s): CKTOTAL, CKMB, CKMBINDEX, TROPONINI in the last 168 hours. BNP (last 3 results) No results for input(s): PROBNP in the last 8760 hours. HbA1C: No results for input(s): HGBA1C in the last 72 hours. CBG: Recent Labs  Lab 01/15/21 1702 01/15/21 2159 01/16/21 0821 01/16/21 2212 01/17/21 2313  GLUCAP 113* 266* 136* 148* 118*   Lipid Profile: No results for input(s): CHOL, HDL, LDLCALC, TRIG, CHOLHDL, LDLDIRECT in the last 72 hours. Thyroid Function Tests: No results for input(s): TSH, T4TOTAL, FREET4, T3FREE, THYROIDAB in the last 72 hours. Anemia Panel: No results for input(s): VITAMINB12, FOLATE, FERRITIN, TIBC, IRON, RETICCTPCT in the last 72 hours. Sepsis Labs: Recent Labs  Lab 01/11/21 1846 01/11/21 1938  01/13/21 1728  LATICACIDVEN 1.6 1.6 0.8    Recent Results (from the past 240 hour(s))  Blood culture (routine single)     Status: None   Collection Time: 01/11/21  7:51 PM   Specimen: BLOOD  Result Value Ref Range Status   Specimen Description BLOOD LEFT ANTECUBITAL  Final   Special Requests   Final    BOTTLES DRAWN AEROBIC AND ANAEROBIC Blood Culture adequate volume   Culture   Final    NO GROWTH 5 DAYS Performed at [facility], [address] [place], [address] [place]    Report Status 01/16/2021 FINAL  Final  Resp Panel by RT-PCR (Flu A&B, Covid) Nasopharyngeal Swab     Status: None   Collection Time: 01/11/21  9:35 PM   Specimen: Nasopharyngeal Swab; Nasopharyngeal(NP) swabs in vial transport medium  Result Value Ref Range Status   SARS Coronavirus 2 by RT PCR NEGATIVE NEGATIVE Final    Comment: (NOTE) SARS-CoV-2 target nucleic acids  are NOT DETECTED.  The SARS-CoV-2 RNA is generally detectable in upper respiratory specimens during the acute phase of infection. The lowest concentration of SARS-CoV-2 viral copies this assay can detect is 138 copies/mL. A negative result does not preclude SARS-Cov-2 infection and should not be used as the sole basis for treatment or other patient management decisions. A negative result may occur with  improper specimen collection/handling, submission of specimen other than nasopharyngeal swab, presence of viral mutation(s) within the areas targeted by this assay, and inadequate number of viral copies(<138 copies/mL). A negative result must be combined with clinical observations, patient history, and epidemiological information. The expected result is Negative.  Fact Sheet for Patients:  BloggerCourse.com  Fact Sheet for Healthcare Providers:  SeriousBroker.it  This test is no t yet approved or cleared by the [place] FDA and  has been authorized for detection and/or  diagnosis of SARS-CoV-2 by FDA under an Emergency Use Authorization (EUA). This EUA will remain  in effect (meaning this test can be used) for the duration of the COVID-19 declaration under Section 564(b)(1) of the Act, 21 U.S.C.section 360bbb-3(b)(1), unless the authorization is terminated  or revoked sooner.       Influenza A by PCR NEGATIVE NEGATIVE Final   Influenza B by PCR NEGATIVE NEGATIVE Final    Comment: (NOTE) The Xpert Xpress SARS-CoV-2/FLU/RSV plus assay is intended as an aid in the diagnosis of influenza from Nasopharyngeal swab specimens and should not be used as a sole basis for treatment. Nasal washings and aspirates are unacceptable for Xpert Xpress SARS-CoV-2/FLU/RSV testing.  Fact Sheet for Patients: BloggerCourse.com  Fact Sheet for Healthcare Providers: SeriousBroker.it  This test is not yet approved or cleared by the [place] FDA and has been authorized for detection and/or diagnosis of SARS-CoV-2 by FDA under an Emergency Use Authorization (EUA). This EUA will remain in effect (meaning this test can be used) for the duration of the COVID-19 declaration under Section 564(b)(1) of the Act, 21 U.S.C. section 360bbb-3(b)(1), unless the authorization is terminated or revoked.  Performed at [facility], [address] [place], [place] [address]   Urine Culture     Status: None   Collection Time: 01/12/21  7:47 AM   Specimen: In/Out Cath Urine  Result Value Ref Range Status   Specimen Description   Final    IN/OUT CATH URINE Performed at [facility], [address] [place], [place] [address]    Special Requests   Final    NONE Performed at [facility], [address] [place], [place] [address]    Culture   Final    NO GROWTH Performed at [facility], [address] [place], [place] [address]    Report Status 01/13/2021 FINAL  Final    Radiology  Studies: No results found.  Scheduled Meds:  aspirin EC  81 mg Oral Daily   atorvastatin  80 mg Oral Daily   feeding supplement  237 mL Oral TID BM   gabapentin  100 mg Oral TID   multivitamin with minerals  1 tablet Oral Daily   Continuous Infusions:  ferric gluconate (FERRLECIT) IVPB 250 mg (01/18/21 1332)     LOS: 7 days     Darlin Priestly, MD [facility]   If 7PM-7AM, please contact night-coverage

## 2021-01-19 DIAGNOSIS — E43 Unspecified severe protein-calorie malnutrition: Secondary | ICD-10-CM

## 2021-01-19 LAB — SARS CORONAVIRUS 2 (TAT 6-24 HRS): SARS Coronavirus 2: NEGATIVE

## 2021-01-19 LAB — BASIC METABOLIC PANEL
Anion gap: 8 (ref 5–15)
BUN: 10 mg/dL (ref 8–23)
CO2: 25 mmol/L (ref 22–32)
Calcium: 8.2 mg/dL — ABNORMAL LOW (ref 8.9–10.3)
Chloride: 104 mmol/L (ref 98–111)
Creatinine, Ser: 0.72 mg/dL (ref 0.61–1.24)
GFR, Estimated: >60 mL/min (ref >60)
Glucose, Bld: 163 mg/dL — ABNORMAL HIGH (ref 70–99)
Potassium: 3.5 mmol/L (ref 3.5–5.1)
Sodium: 137 mmol/L (ref 135–145)

## 2021-01-19 LAB — CBC
HCT: 30 % — ABNORMAL LOW (ref 39.0–52.0)
Hemoglobin: 9.4 g/dL — ABNORMAL LOW (ref 13.0–17.0)
MCH: 23.3 pg — ABNORMAL LOW (ref 26.0–34.0)
MCHC: 31.3 g/dL (ref 30.0–36.0)
MCV: 74.3 fL — ABNORMAL LOW (ref 80.0–100.0)
Platelets: 534 10*3/uL — ABNORMAL HIGH (ref 150–400)
RBC: 4.04 MIL/uL — ABNORMAL LOW (ref 4.22–5.81)
RDW: 22 % — ABNORMAL HIGH (ref 11.5–15.5)
WBC: 11.1 10*3/uL — ABNORMAL HIGH (ref 4.0–10.5)
nRBC: 0.5 % — ABNORMAL HIGH (ref 0.0–0.2)

## 2021-01-19 LAB — SURGICAL PATHOLOGY

## 2021-01-19 LAB — GLUCOSE, CAPILLARY
Glucose-Capillary: 148 mg/dL — ABNORMAL HIGH (ref 70–99)
Glucose-Capillary: 90 mg/dL (ref 70–99)

## 2021-01-19 LAB — MAGNESIUM: Magnesium: 1.9 mg/dL (ref 1.7–2.4)

## 2021-01-19 MED ORDER — ASPIRIN 81 MG PO TABS: 81.0000 mg | ORAL_TABLET | Freq: Every day | ORAL | 0 refills | Status: AC

## 2021-01-19 MED ORDER — ATORVASTATIN CALCIUM 20 MG PO TABS: 80.0000 mg | ORAL_TABLET | Freq: Every day | ORAL | 0 refills | Status: AC

## 2021-01-19 MED ORDER — HALOPERIDOL 2 MG PO TABS: 2.0000 mg | ORAL_TABLET | Freq: Three times a day (TID) | ORAL | 0 refills | Status: AC | PRN

## 2021-01-19 MED ORDER — GABAPENTIN 100 MG PO CAPS: 100.0000 mg | ORAL_CAPSULE | Freq: Three times a day (TID) | ORAL | 0 refills | Status: AC

## 2021-01-19 MED ORDER — OXYCODONE HCL 5 MG PO TABS: 5.0000 mg | ORAL_TABLET | Freq: Four times a day (QID) | ORAL | 0 refills | Status: AC | PRN

## 2021-01-19 MED ORDER — ENSURE ENLIVE PO LIQD: 237.0000 mL | Freq: Three times a day (TID) | ORAL | 12 refills | Status: AC

## 2021-01-19 NOTE — Discharge Summary (Signed)
5       [place] at [facility]   PATIENT NAME: Brad Frost    MR#:  [id number]  DATE OF BIRTH:  1955-09-29  DATE OF ADMISSION:  01/11/2021   ADMITTING PHYSICIAN: Angie Fava, DO  DATE OF DISCHARGE: 01/19/2021  PRIMARY CARE PHYSICIAN: [name], [name], MD   ADMISSION DIAGNOSIS:  Osteomyelitis of right foot (HCC) [M86.9] Sepsis without acute organ dysfunction, due to unspecified organism (HCC) [A41.9] DISCHARGE DIAGNOSIS:  Principal Problem:   Osteomyelitis of right foot (HCC) Active Problems:   Type 2 diabetes mellitus with diabetic neuropathy, without long-term current use of insulin (HCC)   Gangrene of right foot (HCC)   Anemia of chronic disease   Sepsis (HCC)   Tobacco abuse   Protein-calorie malnutrition, severe  SECONDARY DIAGNOSIS:   Past Medical History:  Diagnosis Date  . Diabetes mellitus without complication (HCC)   . Hypertension   . Peripheral vascular disease [facility])    HOSPITAL COURSE:  65 year old male with a known history of diabetes, osteomyelitis, anemia of chronic disease, tobacco abuse was admitted for right foot osteomyelitis.  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   Right foot osteomyelitis Severe PAD S/p right AKA on 9/22 by Dr. Wyn Quaker  Iron deficiency anemia S/p 1 packed RBC transfusion while in the hospital Patient noncompliant with oral iron.  Counseled him to take it  Diabetes type 2 A1c 5.7  History of PE Not on Xarelto  History of BPH Not on medication due to compliance issue  Tobacco abuse Counseled  Aggressive behavior Patient has been cursing and yelling.  He has been very aggressive to health care workers.  Unfortunately he does not have any other facility offers per [facility] (his current behavior is likely not helping).  I have kindly urged him to behave and cooperate with his facility workers but he is very upset and does not want to go back to the same facility.  He threatened me that he will not  leave and has nobody to take care of him.  He shared that he might live out of state if we do not have any other facilities in the area. He remains at very high risk for readmission.  Please note he already has 3 readmissions in last 19-month and 1 ED visit and the same.   DISCHARGE CONDITIONS:  Stable CONSULTS OBTAINED:  Treatment Team:  Annice Needy, MD Leeroy Bock, MD DRUG ALLERGIES:   Allergies  Allergen Reactions  . Sildenafil    DISCHARGE MEDICATIONS:   Allergies as of 01/19/2021       Reactions   Sildenafil         Medication List     STOP taking these medications    metoprolol succinate 25 MG 24 hr tablet Commonly known as: TOPROL-XL   rivaroxaban 20 MG Tabs tablet Commonly known as: XARELTO   tamsulosin 0.4 MG Caps capsule Commonly known as: FLOMAX       TAKE these medications    acetaminophen 325 MG tablet Commonly known as: TYLENOL Take 2 tablets (650 mg total) by mouth every 4 (four) hours as needed for headache or mild pain.   aspirin 81 MG tablet Take 1 tablet (81 mg total) by mouth daily. For prophylaxis   atorvastatin 20 MG tablet Commonly known as: LIPITOR Take 4 tablets (80 mg total) by mouth daily.   clotrimazole 1 % cream Commonly known as: LOTRIMIN Apply 1 application topically in the morning and at bedtime.  feeding supplement Liqd Take 237 mLs by mouth 3 (three) times daily between meals.   gabapentin 100 MG capsule Commonly known as: NEURONTIN Take 1 capsule (100 mg total) by mouth 3 (three) times daily.   haloperidol 2 MG tablet Commonly known as: HALDOL Take 1 tablet (2 mg total) by mouth every 8 (eight) hours as needed for agitation.   oxyCODONE 5 MG immediate release tablet Commonly known as: Oxy IR/ROXICODONE Take 1 tablet (5 mg total) by mouth every 6 (six) hours as needed for up to 3 days for moderate pain or severe pain.               Discharge Care Instructions  (From admission, onward)            Start     Ordered   01/19/21 0000  Discharge wound care:       Comments: 1) You may shower.  Gently clean your stump with soap and water.  Gently pat dry.  2) Daily dressing changes: - Xeroform to staple line - ABD to staple line - Covered with Kerlix - Covered with Coban or Ace - Can be changed more frequently if drainage is noted   01/19/21 1124           DISCHARGE INSTRUCTIONS:  Vascular Surgery Discharge Instructions:  1) You may shower.  Gently clean your stump with soap and water.  Gently pat dry.  2) Daily dressing changes: - Xeroform to staple line - ABD to staple line - Covered with Kerlix - Covered with Coban or Ace - Can be changed more frequently if drainage is noted  DIET:  Cardiac diet DISCHARGE CONDITION:  Stable ACTIVITY:  Activity as tolerated OXYGEN:  Home Oxygen: No.  Oxygen Delivery: room air DISCHARGE LOCATION:  [facility] with PT, OT and nursing aide  If you experience worsening of your admission symptoms, develop shortness of breath, life threatening emergency, suicidal or homicidal thoughts you must seek medical attention immediately by calling 911 or calling your MD immediately  if symptoms less severe.  You Must read complete instructions/literature along with all the possible adverse reactions/side effects for all the Medicines you take and that have been prescribed to you. Take any new Medicines after you have completely understood and accpet all the possible adverse reactions/side effects.   Please note  You were cared for by a hospitalist during your hospital stay. If you have any questions about your discharge medications or the care you received while you were in the hospital after you are discharged, you can call the unit and asked to speak with the hospitalist on call if the hospitalist that took care of you is not available. Once you are discharged, your primary care physician will handle any  further medical issues. Please note that NO REFILLS for any discharge medications will be authorized once you are discharged, as it is imperative that you return to your primary care physician (or establish a relationship with a primary care physician if you do not have one) for your aftercare needs so that they can reassess your need for medications and monitor your lab values.    On the day of Discharge:  VITAL SIGNS:  Blood pressure 98/64, pulse 92, temperature 98.9 F (37.2 C), resp. rate 16, height 5\' 11"  (1.803 m), weight 70.2 kg, SpO2 97 %. PHYSICAL EXAMINATION:  GENERAL:  65 y.o.-year-old patient lying in the bed with no acute distress.  EYES: Pupils equal, round, reactive to light  and accommodation. No scleral icterus. Extraocular muscles intact.  HEENT: Head atraumatic, normocephalic. Oropharynx and nasopharynx clear.  NECK:  Supple, no jugular venous distention. No thyroid enlargement, no tenderness.  LUNGS: Normal breath sounds bilaterally, no wheezing, rales,rhonchi or crepitation. No use of accessory muscles of respiration.  CARDIOVASCULAR: S1, S2 normal. No murmurs, rubs, or gallops.  ABDOMEN: Soft, non-tender, non-distended. Bowel sounds present. No organomegaly or mass.  EXTREMITIES: Right lower extremity has dressing on.  Underlying stump is healthy.  Incision line is clean dry and intact. NEUROLOGIC: Cranial nerves II through XII are intact. Muscle strength 5/5 in all extremities. Sensation intact. Gait not checked.  PSYCHIATRIC: Cursing and yelling.  Very aggressive and agitated at times SKIN: As above DATA REVIEW:   CBC Recent Labs  Lab 01/19/21 0920  WBC 11.1*  HGB 9.4*  HCT 30.0*  PLT 534*    Chemistries  Recent Labs  Lab 01/19/21 0920  NA 137  K 3.5  CL 104  CO2 25  GLUCOSE 163*  BUN 10  CREATININE 0.72  CALCIUM 8.2*  MG 1.9     Outpatient follow-up  Follow-up Information     Georgiana Spinner, NP Follow up in 3 week(s).   Specialty: Vascular  Surgery Why: First post-op check. AKA. Staple removal. Will need ABI with visit for left lower extremity. Office will call Patient w/ Appt. Contact information: [address] [place] [address] [phone]         [name], [name], MD. Schedule an appointment as soon as possible for a visit in 2 day(s).   Specialty: Internal Medicine Why: [facility] Discharge F[facility]UP; Per  [facility] will contact Patient directly w/ Appt.within One Week. Contact information: [address] [place] [address] [phone]                 30 Day Unplanned Readmission Risk Score    Flowsheet Row ED to Hosp-Admission (Current) from 01/11/2021 in [facility] (1A)  30 Day Unplanned Readmission Risk Score (%) 25.68 Filed at 01/19/2021 1200       This score is the patient's risk of an unplanned readmission within 30 days of being discharged (0 -100%). The score is based on dignosis, age, lab data, medications, orders, and past utilization.   Low:  0-14.9   Medium: 15-21.9   High: 22-29.9   Extreme: 30 and above          Management plans discussed with the patient, nursing and they are in agreement.  CODE STATUS: Full Code   TOTAL TIME TAKING CARE OF THIS PATIENT: 45 minutes.    Delfino Lovett M.D on 01/19/2021 at 1:37 PM  [facility]   CC: Primary care physician; [name], [name], MD   Note: This dictation was prepared with Dragon dictation along with smaller phrase technology. Any transcriptional errors that result from this process are unintentional.

## 2021-01-19 NOTE — Progress Notes (Signed)
[facility] Daily Progress Note  01/15/21: Right above-the-knee amputation  Subjective: Patient sitting in a chair comfortably this AM.  No acute issues overnight with the exception of continued aggression and agitation.  Objective: Vitals:   01/18/21 1210 01/18/21 2029 01/19/21 0439 01/19/21 0759  BP: 101/62 92/65 93/60  98/64  Pulse: 87 98 96 92  Resp: 15 20 20 16   Temp: 98.6 F (37 C) 98.3 F (36.8 C) 98.9 F (37.2 C) 98.9 F (37.2 C)  TempSrc: Oral Oral Oral   SpO2: 96% 98% 97% 97%  Weight:      Height:        Intake/Output Summary (Last 24 hours) at 01/19/2021 1109 Last data filed at 01/19/2021 0021 Gross per 24 hour  Intake 266.93 ml  Output 900 ml  Net -633.07 ml   Physical Exam: A&Ox3, NAD CV: RRR Pulmonary: CTA Bilaterally Abdomen: Soft, Nontender, Nondistended Vascular:  Right lower extremity: Operating room dressing has been removed.  Stump is healthy.  Incision line is clean dry and intact.   Laboratory: CBC    Component Value Date/Time   WBC 11.1 (H) 01/19/2021 0920   HGB 9.4 (L) 01/19/2021 0920   HCT 30.0 (L) 01/19/2021 0920   PLT 534 (H) 01/19/2021 0920   BMET    Component Value Date/Time   NA 137 01/19/2021 0920   K 3.5 01/19/2021 0920   CL 104 01/19/2021 0920   CO2 25 01/19/2021 0920   GLUCOSE 163 (H) 01/19/2021 0920   BUN 10 01/19/2021 0920   CREATININE 0.72 01/19/2021 0920   CALCIUM 8.2 (L) 01/19/2021 0920   GFRNONAA >60 01/19/2021 0920   GFRAA >60 04/14/2019 1345   Assessment/Planning: The patient is a 65 year old male with severe atherosclerotic disease with gangrene to the right lower extremity now status post right above-the-knee amputation - POD#4   1) Hemoglobin is relatively stable.  Decrease in white blood cell count. AM CBC. 2) Normal creatinine.  3) Daily dressing changes. 4) Patient is currently on oxycodone, Neurontin and breakthrough morphine which seems to be adequate.  As needed Haldol for  agitation. 5) Patient is on aspirin and statin for medical management. 6) Worked with PT/OT who recommends SNF.  Agree with this assessment. 7) From a vascular standpoint, patient can be discharged to his nursing facility when medically stable.  Discussed with Dr. 79 Casha Estupinan PA-C 01/19/2021 11:09 AM

## 2021-01-19 NOTE — TOC Progression Note (Addendum)
Transition of Care [facility]) - Progression Note    Patient Details  Name: Brad Frost MRN: [id number] Date of Birth: 01/20/1956  Transition of Care [facility]) CM/SW Contact  Barrie Dunker, RN Phone Number: 01/19/2021, 1:51 PM  Clinical Narrative:   EMS called for the patient to go to room 28A at [facility], the bedside nurse called report, he is the next up to be picked up, security is on stand by for any issues         Expected Discharge Plan and Services           Expected Discharge Date: 01/19/21                                     Social Determinants of Health (SDOH) Interventions    Readmission Risk Interventions No flowsheet data found.

## 2021-01-19 NOTE — Discharge Instructions (Signed)
Vascular Surgery Discharge Instructions:  1) You may shower.  Gently clean your stump with soap and water.  Gently pat dry.  2) Daily dressing changes: - Xeroform to staple line - ABD to staple line - Covered with Kerlix - Covered with Coban or Ace - Can be changed more frequently if drainage is noted 

## 2021-01-19 NOTE — Progress Notes (Signed)
Physical Therapy Treatment Patient Details Name: Brad Frost MRN: [id number] DOB: [birth year] Today's Date: 01/19/2021   History of Present Illness Brad Frost is a 65yoM who comes to [facility] on 01/11/21 from [facility] after concerns of progression of grangrenous wound s/p recent Rt foot toe amputation. Pt was here 3 weeks ago and left AMA. Pt seen by vascular this admission who is recommending amputation; similar recommendation for AKA in June at that admission. Pt went to [facility] after May admission, still there at prior admission, now there for LTC. 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 PMH: PVD, DM2, chronic anemia, chronic diabetic foot ulcers, bilat PE no longer on AC.    PT Comments    Pt in recliner upon nurse and PT arrival; pt initially declining therapy but then reporting needing to get back to bed.  Pt did well following cuing technique for transfers and participated well with transfer recliner to bed (2 assist with RW use).  Will continue to focus on strengthening, balance, and progressive functional mobility during hospitalization.   Recommendations for follow up therapy are one component of a multi-disciplinary discharge planning process, led by the attending physician.  Recommendations may be updated based on patient status, additional functional criteria and insurance authorization.  Follow Up Recommendations  SNF;Supervision/Assistance - 24 hour     Equipment Recommendations  Rolling walker with 5" wheels;3in1 (PT);Hospital bed;Wheelchair (measurements PT);Wheelchair cushion (measurements PT)    Recommendations for Other Services       Precautions / Restrictions Precautions Precautions: Fall Precaution Comments: new R AKA Restrictions Weight Bearing Restrictions: Yes RLE Weight Bearing: Non weight bearing     Mobility  Bed Mobility Overal bed mobility: Needs Assistance Bed Mobility: Sit to Supine       Sit to supine: +2 for physical assistance;HOB  elevated   General bed mobility comments: assist for LE's and trunk    Transfers Overall transfer level: Needs assistance Equipment used: Rolling walker (2 wheeled) Transfers: Sit to/from [employer] Sit to Stand: Min assist;Mod assist;+2 physical assistance Stand pivot transfers: Min assist;Mod assist;+2 physical assistance       General transfer comment: vc's for UE/LE placement for transfers; assist to initiate stand up to walker and control descent sitting; pt pivoting on L LE at times and other times with very small "hops"  Ambulation/Gait             General Gait Details: deferred d/t pt fatigue and overall weakness   Stairs             Wheelchair Mobility    Modified Rankin (Stroke Patients Only)       Balance Overall balance assessment: Needs assistance Sitting-balance support: No upper extremity supported;Feet supported Sitting balance-Leahy Scale: Good Sitting balance - Comments: steady sitting reaching within BOS   Standing balance support: Bilateral upper extremity supported Standing balance-Leahy Scale: Poor Standing balance comment: assist for balance standing with B UE support on RW (initial posterior lean noted requiring assist to correct)                            Cognition Arousal/Alertness: Awake/alert Behavior During Therapy: WFL for tasks assessed/performed Overall Cognitive Status:  (Oriented to at least person)  Exercises      General Comments  Nursing cleared pt for participation in physical therapy.  Pt agreeable to PT session.      Pertinent Vitals/Pain Pain Assessment: Faces Faces Pain Scale: Hurts little more Pain Location: R LE with movement Pain Descriptors / Indicators: Guarding;Grimacing Pain Intervention(s): Limited activity within patient's tolerance;Monitored during session;Repositioned    Home Living                       Prior Function            PT Goals (current goals can now be found in the care plan section) Acute Rehab PT Goals Patient Stated Goal: to improve mobility PT Goal Formulation: With patient Time For Goal Achievement: 02/01/21 Potential to Achieve Goals: Fair Progress towards PT goals: Progressing toward goals    Frequency    7X/week      PT Plan Current plan remains appropriate    Co-evaluation              AM-PAC PT "6 Clicks" Mobility   Outcome Measure  Help needed turning from your back to your side while in a flat bed without using bedrails?: A Lot Help needed moving from lying on your back to sitting on the side of a flat bed without using bedrails?: Total Help needed moving to and from a bed to a chair (including a wheelchair)?: Total Help needed standing up from a chair using your arms (e.g., wheelchair or bedside chair)?: Total Help needed to walk in hospital room?: Total Help needed climbing 3-5 steps with a railing? : Total 6 Click Score: 7    End of Session Equipment Utilized During Treatment: Gait belt Activity Tolerance: Patient tolerated treatment well Patient left: in bed;with call bell/phone within reach;with bed alarm set;with nursing/sitter in room;Other (comment) (L LE elevated on pillow with heel floating; pt declined R LE elevation d/t pain concerns) Nurse Communication: Mobility status;Precautions PT Visit Diagnosis: Unsteadiness on feet (R26.81);Muscle weakness (generalized) (M62.81);Difficulty in walking, not elsewhere classified (R26.2)     Time: 8338-2505 PT Time Calculation (min) (ACUTE ONLY): 18 min  Charges:  $Therapeutic Activity: 8-22 mins                    Hendricks Limes, PT 01/19/21, 12:25 PM

## 2021-01-19 NOTE — Progress Notes (Signed)
Patient aggressive and agitated throughout shift. Refused all medications and lovenox. Hospitalist Mansy, MD notified and orders received for IM Haldol, medication effective x 3 hours. Patient encouraged to allow vitals to be obtained and labs drawn, but he refused. Stable condition at end of shift. Monitoring continues.

## 2021-01-21 LAB — PREPARE RBC (CROSSMATCH)

## 2021-01-22 ENCOUNTER — Emergency Department: Payer: No Typology Code available for payment source

## 2021-01-22 ENCOUNTER — Emergency Department
Admission: EM | Admit: 2021-01-22 | Discharge: 2021-01-22 | Discharge status: Against Medical Advice | Payer: No Typology Code available for payment source | Attending: Emergency Medicine | Admitting: Emergency Medicine

## 2021-01-22 ENCOUNTER — Other Ambulatory Visit: Payer: Self-pay

## 2021-01-22 ENCOUNTER — Encounter: Payer: Self-pay | Admitting: Vascular Surgery

## 2021-01-22 DIAGNOSIS — I959 Hypotension, unspecified: Secondary | ICD-10-CM | POA: Insufficient documentation

## 2021-01-22 DIAGNOSIS — Z8616 Personal history of COVID-19: Secondary | ICD-10-CM | POA: Insufficient documentation

## 2021-01-22 DIAGNOSIS — I10 Essential (primary) hypertension: Secondary | ICD-10-CM | POA: Diagnosis not present

## 2021-01-22 DIAGNOSIS — Z719 Counseling, unspecified: Secondary | ICD-10-CM | POA: Diagnosis not present

## 2021-01-22 DIAGNOSIS — R42 Dizziness and giddiness: Secondary | ICD-10-CM | POA: Diagnosis present

## 2021-01-22 DIAGNOSIS — Z7982 Long term (current) use of aspirin: Secondary | ICD-10-CM | POA: Insufficient documentation

## 2021-01-22 DIAGNOSIS — J00 Acute nasopharyngitis [common cold]: Secondary | ICD-10-CM | POA: Diagnosis not present

## 2021-01-22 DIAGNOSIS — R000 Tachycardia, unspecified: Secondary | ICD-10-CM | POA: Diagnosis not present

## 2021-01-22 DIAGNOSIS — E119 Type 2 diabetes mellitus without complications: Secondary | ICD-10-CM | POA: Insufficient documentation

## 2021-01-22 DIAGNOSIS — Z7189 Other specified counseling: Secondary | ICD-10-CM

## 2021-01-22 DIAGNOSIS — F17210 Nicotine dependence, cigarettes, uncomplicated: Secondary | ICD-10-CM | POA: Insufficient documentation

## 2021-01-22 DIAGNOSIS — R0682 Tachypnea, not elsewhere classified: Secondary | ICD-10-CM | POA: Insufficient documentation

## 2021-01-22 DIAGNOSIS — R6510 Systemic inflammatory response syndrome (SIRS) of non-infectious origin without acute organ dysfunction: Secondary | ICD-10-CM | POA: Diagnosis not present

## 2021-01-22 MED ORDER — LACTATED RINGERS IV BOLUS: 1000.0000 mL | Freq: Once | INTRAVENOUS | Status: DC

## 2021-01-22 NOTE — ED Notes (Signed)
ERMD at bedside along with this RN and IV team RN to discuss and explain to patient need for IV , labs and testing for this pt . Pt vehemently refuses to have IV done for him and states wants to go back to facility. ERMD explained to patient the plan of care  and risks on refusal of treatment.  Pt is oriented and coherent, verbalized back to the staff  the risks explained to him. Pt stated wants to go back home to facility. Will notify his care home and request to arrange transport for this pt

## 2021-01-22 NOTE — ED Notes (Signed)
[facility] at [phone], to notify pt leaving AMA and wants to go back to facility, no answer

## 2021-01-22 NOTE — ED Triage Notes (Addendum)
Pt presents to ED from [facility] with concerns of hypotension and possible sepsis. Pt presents with a BP of 88/62 (69). Pt denies any complaints at this time states he did not call EMS the staff at facility did. Pt recently had L BKA about 2 weeks ago. Pt is A&Ox4 at this time.   This RN and another RN attempted to place PIV.  Pt refuses anymore "sticks and needs to rest". MD aware. Pt is agreeable for IV team to come place IV and take blood, will place this order.

## 2021-01-22 NOTE — Progress Notes (Signed)
Responded to consult for IV. Pt allowed assessment with [place]. Potential appropriate vein found; however, pt refused attempt. MD aware and speaking with pt.

## 2021-01-22 NOTE — ED Notes (Signed)
[facility] called to transport back to [facility]

## 2021-01-22 NOTE — ED Provider Notes (Signed)
[facility] Emergency Department Provider Note  ____________________________________________   Event Date/Time   First MD Initiated Contact with Patient 01/22/21 1833     (approximate)  I have reviewed the triage vital signs and the nursing notes.   HISTORY  Chief Complaint Dizziness   HPI Brad Frost is a 65 y.o. 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  Patient states that what happened was he went out to the smoking room in his wheelchair to smoke and that he got very cold.  He does not remember feeling dizzy and he says a staff member called EMS because they are worried about him.  He denies any falls or injuries, headache, earache, sore throat, chest pain, cough, shortness of breath, abdominal pain, nausea, vomiting, diarrhea, dysuria, rash or any new extremity pain including at the site of his recent amputation.  He has no other acute concerns at this time.  Denies any recent EtOH or illicit drug use.         Past Medical History:  Diagnosis Date   Diabetes mellitus without complication (HCC)    Hypertension    Peripheral vascular disease (HCC)     Patient Active Problem List   Diagnosis Date Noted   Protein-calorie malnutrition, severe 01/17/2021   Sepsis (HCC) 01/12/2021   Tobacco abuse 01/12/2021   Osteomyelitis of right foot (HCC) 01/11/2021   Atherosclerosis of native arteries of extremity with rest pain (HCC) 12/31/2020   Hyperlipidemia 12/31/2020   Gangrene of right foot (HCC) 10/09/2020   Anemia of chronic disease 10/09/2020   Toe ulcer, right (HCC) 09/17/2020   Cellulitis of lower extremity 09/16/2020   COVID-19 04/13/2019   Hypotension    COVID-19 virus infection 04/04/2019   Pneumonia due to COVID-19 virus    Bilateral  pulmonary embolism (HCC)    Lung mass    AKI (acute kidney injury) (HCC)    Type 2 diabetes mellitus with diabetic neuropathy, without long-term current use of insulin (HCC)    PVD (peripheral vascular disease) (HCC) 02/14/2015    Past Surgical History:  Procedure Laterality Date   AMPUTATION Right 01/15/2021   Procedure: AMPUTATION ABOVE KNEE;  Surgeon: Annice Needy, MD;  Location: [facility] ORS;  Service: General;  Laterality: Right;   LEG SURGERY  August 20, 2014   Right Leg  BPG   LOWER EXTREMITY ANGIOGRAPHY Left 09/19/2020   Procedure: Lower Extremity Angiography;  Surgeon: Renford Dills, MD;  Location: [facility] INVASIVE CV LAB;  Service: Cardiovascular;  Laterality: Left;   LOWER EXTREMITY ANGIOGRAPHY Right 10/10/2020   Procedure: Lower Extremity Angiography;  Surgeon: Annice Needy, MD;  Location: [facility] INVASIVE CV LAB;  Service: Cardiovascular;  Laterality: Right;    Prior to Admission medications   Medication Sig Start Date End Date Taking? Authorizing Provider  aspirin 81 MG tablet Take 1 tablet (81 mg total) by mouth daily. For prophylaxis 01/19/21  Yes Delfino Lovett, MD  atorvastatin (LIPITOR) 20 MG tablet Take 4 tablets (80 mg total) by mouth daily. 01/19/21  Yes Delfino Lovett, MD  clotrimazole (LOTRIMIN) 1 % cream Apply 1 application topically in the morning and at bedtime. 12/09/20  Yes [provider]  gabapentin (NEURONTIN) 100 MG capsule Take 1 capsule (100 mg total) by mouth 3 (three) times  daily. 01/19/21 02/18/21 Yes Delfino Lovett, MD  haloperidol (HALDOL) 2 MG tablet Take 1 tablet (2 mg total) by mouth every 8 (eight) hours as needed for agitation. 01/19/21  Yes Delfino Lovett, MD  oxyCODONE (OXY IR/ROXICODONE) 5 MG immediate release tablet Take 1 tablet (5 mg total) by mouth every 6 (six) hours as needed for up to 3 days for moderate pain or severe pain. 01/19/21 01/22/21 Yes Delfino Lovett, MD  acetaminophen (TYLENOL) 325 MG tablet Take 2 tablets (650 mg total) by mouth every 4 (four)  hours as needed for headache or mild pain. 09/23/20   Lurene Shadow, MD  feeding supplement (ENSURE ENLIVE / ENSURE PLUS) LIQD Take 237 mLs by mouth 3 (three) times daily between meals. 01/19/21   Delfino Lovett, MD    Allergies Sildenafil  Family History  Problem Relation Age of Onset   Hypertension Mother     Social History Social History   Tobacco Use   Smoking status: Every Day    Types: Cigarettes   Smokeless tobacco: Never  Substance Use Topics   Alcohol use: Yes    Comment: social, occassional   Drug use: No    Review of Systems  Review of Systems  Constitutional:  Positive for chills. Negative for fever.  HENT:  Negative for sore throat.   Eyes:  Negative for pain.  Respiratory:  Negative for cough and stridor.   Cardiovascular:  Negative for chest pain.  Gastrointestinal:  Negative for vomiting.  Skin:  Negative for rash.  Neurological:  Negative for seizures, loss of consciousness and headaches.  Psychiatric/Behavioral:  Negative for suicidal ideas.   All other systems reviewed and are negative.    ____________________________________________   PHYSICAL EXAM:  VITAL SIGNS: ED Triage Vitals  Enc Vitals Group     BP 01/22/21 1831 (!) 88/60     Pulse Rate 01/22/21 1831 (!) 111     Resp 01/22/21 1831 (!) 26     Temp 01/22/21 1831 98 F (36.7 C)     Temp Source 01/22/21 1831 Oral     SpO2 01/22/21 1831 96 %     Weight 01/22/21 1832 47 lb 9.6 oz (21.6 kg)     Height 01/22/21 1832 5\' 11"  (1.803 m)     Head Circumference --      Peak Flow --      Pain Score 01/22/21 1832 0     Pain Loc --      Pain Edu? --      Excl. in GC? --    Vitals:   01/22/21 1831 01/22/21 1900  BP: (!) 88/60 92/65  Pulse: (!) 111 (!) 106  Resp: (!) 26 17  Temp: 98 F (36.7 C)   SpO2: 96% 94%   Physical Exam Vitals and nursing note reviewed.  Constitutional:      Appearance: He is well-developed.  HENT:     Head: Normocephalic and atraumatic.     Right Ear: External  ear normal.     Left Ear: External ear normal.     Nose: Nose normal.     Mouth/Throat:     Mouth: Mucous membranes are dry.  Eyes:     Conjunctiva/sclera: Conjunctivae normal.  Cardiovascular:     Rate and Rhythm: Regular rhythm. Tachycardia present.     Heart sounds: No murmur heard. Pulmonary:     Effort: Pulmonary effort is normal. Tachypnea present. No respiratory distress.     Breath sounds: Normal breath sounds.  Abdominal:  Palpations: Abdomen is soft.     Tenderness: There is no abdominal tenderness. There is no right CVA tenderness or left CVA tenderness.  Musculoskeletal:     Cervical back: Neck supple.     Right Lower Extremity: Right leg is amputated above knee.  Skin:    General: Skin is warm and dry.     Capillary Refill: Capillary refill takes 2 to 3 seconds.  Neurological:     Mental Status: He is alert and oriented to person, place, and time.  Psychiatric:        Mood and Affect: Mood normal.    Sign of recent right lower extremity amputation appears clean with incision intact and dry no significant surrounding induration, erythema, warmth or tenderness. ____________________________________________   LABS (all labs ordered are listed, but only abnormal results are displayed)  Labs Reviewed  CULTURE, BLOOD (SINGLE)  URINE CULTURE  LACTIC ACID, PLASMA  LACTIC ACID, PLASMA  COMPREHENSIVE METABOLIC PANEL  CBC WITH DIFFERENTIAL/PLATELET  PROTIME-INR  APTT  URINALYSIS, COMPLETE (UACMP) WITH MICROSCOPIC  PROCALCITONIN  TROPONIN I (HIGH SENSITIVITY)  TROPONIN I (HIGH SENSITIVITY)   ____________________________________________  EKG ECG shows sinus tachycardia with a ventricular rate of 107, normal axis, unremarkable intervals without clear evidence of acute ischemia or significant arrhythmia.  There are some nonspecific change in V1. ____________________________________________  RADIOLOGY  ED MD interpretation: Chest x-ray has no evidence of focal  consolidation, effusion, edema, pneumothorax or other clear acute thoracic process.  Official radiology report(s): DG Chest Port 1 View  Result Date: 01/22/2021 CLINICAL DATA:  Questionable sepsis EXAM: PORTABLE CHEST 1 VIEW COMPARISON:  01/11/2021 FINDINGS: Normal heart size and mediastinal contours. No acute infiltrate or edema. No effusion or pneumothorax. No acute osseous findings. Artifact from EKG leads. IMPRESSION: Stable and negative portable chest. Electronically Signed   By: Tiburcio Pea M.D.   On: 01/22/2021 19:09    ____________________________________________   PROCEDURES  Procedure(s) performed (including Critical Care):  .1-3 Lead EKG Interpretation Performed by: Gilles Chiquito, MD Authorized by: Gilles Chiquito, MD     Interpretation: abnormal     ECG rate assessment: tachycardic     Rhythm: sinus tachycardia     Ectopy: none     Conduction: normal     ____________________________________________   INITIAL IMPRESSION / ASSESSMENT AND PLAN / ED COURSE      Patient presents with above-stated history exam for assessment of reported dizziness and low blood pressure with patient denying any dizziness and stating he only got cold when he went out for a cigarette.  On arrival he was tachycardic at 111, hypotensive with a BP of 88/6026 with an SPO2 of 96% on room air.  He is afebrile.  He is denying any other acute complaints.  While patient is denying any acute symptoms he does have evidence of possible early shock and differential includes arrhythmia, sepsis, PE, ACS, anemia, dehydration and metabolic derangements.  He is awake and alert and oriented x4 and I have low suspicion for toxic ingestion or recent trauma.  No obvious foci of infection on exam i.e. cellulitis or deep space infection of the head or neck.  Lungs are clear bilaterally and abdomen is soft.  Chest x-ray has no evidence of focal consolidation, effusion, edema, pneumothorax or other clear acute  thoracic process.  CT shows tachycardia none 21 but no significant arrhythmia or other clearance of acute ischemia.  Sepsis protocol initiated on arrival.  Initially immediately ordered including IV fluid bolus.  However  patient is refusing staff attempts to place an IV stating he does not want to be stuck anymore and is only amenable to receiving IV placement by IV team.  I offered to place one myself although he declines this and I emphasized that he could decline and that this could cause him to experience some death and disability if he delays his care.  He states he understands but still refusing IV placement by anyone except IV team at this time.  I had extensive and repeated discussions with the patient regarding importance of placement of an IV to administer IV fluids and obtain blood work to look for evidence of infection, kidney injury, anemia, metabolic derangements, ACS and obtain a dimer to assess for PE.  Patient repeatedly states he does not want anything else done to him and repeatedly is refusing an IV.  On several assessments by this examiner he is alert and oriented x4.  He is refusing this examiner reaching out to any family members.  He is refusing alternate team member attempt IV access and states he does not want to be in the emergency room in the hospital and wants to be discharged immediately as she is done with health care and having anything done to him.  He is able to repeat back to me above-noted concerns and that he is at high risk for death and disability if he does not undergo stabilization and further diagnostic studies.  I think he has capacity to make the decision to refuse further medical care at this time.  He was discharged strongly against my advice after repeated attempts by this examiner and RN to change his mind.  Advised him he may return at any time or go to any other emergency room.  Discharged AMA.      ____________________________________________   FINAL  CLINICAL IMPRESSION(S) / ED DIAGNOSES  Final diagnoses:  Tachycardia  Hypotension, unspecified hypotension type  SIRS (systemic inflammatory response syndrome) (HCC)  Goals of care, counseling/discussion    Medications  lactated ringers bolus 1,000 mL (has no administration in time range)     ED Discharge Orders     None        Note:  This document was prepared using Dragon voice recognition software and may include unintentional dictation errors.    Gilles Chiquito, MD 01/22/21 2021

## 2021-02-09 ENCOUNTER — Encounter: Payer: Self-pay | Admitting: Nurse Practitioner

## 2021-02-09 ENCOUNTER — Non-Acute Institutional Stay: Payer: Medicare Other | Admitting: Nurse Practitioner

## 2021-02-09 ENCOUNTER — Other Ambulatory Visit (INDEPENDENT_AMBULATORY_CARE_PROVIDER_SITE_OTHER): Payer: Self-pay | Admitting: Vascular Surgery

## 2021-02-09 VITALS — BP 156/48 | HR 92 | Temp 97.6°F | Resp 20 | Wt 139.1 lb

## 2021-02-09 DIAGNOSIS — R5381 Other malaise: Secondary | ICD-10-CM

## 2021-02-09 DIAGNOSIS — I70209 Unspecified atherosclerosis of native arteries of extremities, unspecified extremity: Secondary | ICD-10-CM

## 2021-02-09 DIAGNOSIS — Z515 Encounter for palliative care: Secondary | ICD-10-CM

## 2021-02-09 DIAGNOSIS — I7090 Unspecified atherosclerosis: Secondary | ICD-10-CM

## 2021-02-09 NOTE — Progress Notes (Addendum)
Therapist, nutritional Palliative Care Consult Note Telephone: [phone]  Fax: [phone]    Date of encounter: 02/09/21 8:06 PM PATIENT NAME: Brad Frost [address] [place] [address]   [phone] (home)  DOB: 05-Feb-1956 MRN: [id number] PRIMARY CARE PROVIDER:   Dr [facility] Azucena Kuba, [name], MD,  [address] [place] [address] [phone]  RESPONSIBLE PARTY:    Contact Information     Name Relation Home Work Novice Brother [phone]     moore,patsy Sister [phone]        I met face to face with patient in facility. Palliative Care was asked to follow this patient by consultation request of  Dr Roseanne Reno to address advance care planning and complex medical decision making. This is a follow up visit.                                  ASSESSMENT AND PLAN / RECOMMENDATIONS:  Symptom Management/Plan: 1. Advance Care Planning; full code 2. Goals of Care: Goals include to maximize quality of life and symptom management. Our advance care planning conversation included a discussion about:    The value and importance of advance care planning  Exploration of personal, cultural or spiritual beliefs that might influence medical decisions  Exploration of goals of care in the event of a sudden injury or illness  Identification and preparation of a healthcare agent  Review and updating or creation of an advance directive document. 3. Palliative care encounter; Palliative care encounter; Palliative medicine team will continue to support patient, patient's family, and medical team. Visit consisted of counseling and education dealing with the complex and emotionally intense issues of symptom management and palliative care in the setting of serious and potentially life-threatening illness 4. Pain secondary to right aka on 01/19/2021 currently controlled, continue to current regimen. 5. Debility secondary to  right aka on 01/19/2021 continue to try to encourage therapy, mobility, self independence.  6. f/u 2 weeks for ongoing monitoring chronic disease progression, ongoing discussions complex medical decision making  11/11/2020 wbc 10.9, hgb 7 4, hct 24.3, platelets 211, sodium 138, potassium 4.4, chloride 103, co2 20, calcium 9.0, bun 7.8, creatinine 0.60, glucose 58, bnp 19.00  Follow up Palliative Care Visit: Palliative care will continue to follow for complex medical decision making, advance care planning, and clarification of goals. Return 2 weeks or prn.  I spent 47 minutes providing this consultation started at 2:00 pm. More than 50% of the time in this consultation was spent in counseling and care coordination.  PPS: 30%  Chief Complaint: Initial palliative consult for complex medical decision making  HISTORY OF PRESENT ILLNESS:  Brad Frost is a 65 y.o. year old male  with multiple medical problems including PVD, severe PAD, IDA, anemia of chronic disease, DM, BPH, h/o PE, tobacco abuse. 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 ED visit 01/22/2021 for dizziness, low bp, tachycardic at 111 for which he signed out AMA. Brad Frost is currently residing at [facility]. Brad Frost is requiring assistance for mobility, transfers, adl's including bathing, dressing, toileting. Brad Frost does feed himself, though per staff appetite has varied depending on what is being served. Staff endorses Brad Frost has been having more trouble declining help, not as motivated for therapy. Brad Frost is able to  verbalize his needs. Brad Frost is a full code. At present, Brad Frost is lying in bed. Brad Frost appears comfortable. We talked about purpose of pc visit. Brad Frost in agreement. We talked about Brad Frost has been living independently prior to amputation; hospitalization. LIfe review. Brad Frost endorses he has adult children with  whom does come by and helps him when he needs it. We talked about past medical problems, chronic disease. We talked about pending prosthetic. We talked about therapy. We talked about symptoms, appetite. We talked about d/c planning. Mr. Shackett endorses he would like to return home although he will need to be more independent than currently to be able to live by himself. We talked about medical goals. We talked about role pc in poc. Discussed will continue to follow while at str. I updated staff.   History obtained from review of EMR, discussion with facility staff and Mr. Szatkowski.  I reviewed available labs, medications, imaging, studies and related documents from the EMR.  Records reviewed and summarized above.   ROS Full 10 system review of systems performed and negative with exception of: as per HPI.   Physical Exam: Constitutional: NAD General: frail appearing, decompensated male EYES: lids intact ENMT: oral mucous membranes moist CV: S1S2, RRR Pulmonary: LCTA, no increased work of breathing, no cough, room air Abdomen: soft and non tender MSK: bed to w/c;  Skin: warm and dry Neuro:  + generalized weakness,  no cognitive impairment Psych: non-anxious affect, A and O x 3 Questions and concerns were addressed. Provided general support and encouragement, no other unmet needs identified   Thank you for the opportunity to participate in the care of Mr. Murrillo.  The palliative care team will continue to follow. Please call our office at [phone] if we can be of additional assistance.   This chart was dictated using voice recognition software.  Despite best efforts to proofread,  errors can occur which can change the documentation meaning.   Sinda Leedom Ihor Gully, NP

## 2021-02-10 ENCOUNTER — Other Ambulatory Visit: Payer: Self-pay

## 2021-02-11 ENCOUNTER — Other Ambulatory Visit: Payer: Self-pay

## 2021-02-11 ENCOUNTER — Ambulatory Visit (INDEPENDENT_AMBULATORY_CARE_PROVIDER_SITE_OTHER): Payer: Medicare Other | Admitting: Nurse Practitioner

## 2021-02-11 ENCOUNTER — Ambulatory Visit (INDEPENDENT_AMBULATORY_CARE_PROVIDER_SITE_OTHER): Payer: Medicare Other

## 2021-02-11 VITALS — BP 96/63 | HR 92 | Ht 70.0 in | Wt 179.0 lb

## 2021-02-11 DIAGNOSIS — I7090 Unspecified atherosclerosis: Secondary | ICD-10-CM

## 2021-02-11 DIAGNOSIS — Z89611 Acquired absence of right leg above knee: Secondary | ICD-10-CM

## 2021-02-11 DIAGNOSIS — I70209 Unspecified atherosclerosis of native arteries of extremities, unspecified extremity: Secondary | ICD-10-CM

## 2021-02-15 ENCOUNTER — Encounter (INDEPENDENT_AMBULATORY_CARE_PROVIDER_SITE_OTHER): Payer: Self-pay | Admitting: Nurse Practitioner

## 2021-02-15 NOTE — Progress Notes (Signed)
Subjective:    Patient ID: Brad Frost, male    DOB: 1955/11/03, 65 y.o.   MRN: [id number] Chief Complaint  Patient presents with   Follow-up    3 week [facility] post AKA abi LLE staple removal     Brad Frost 65 year old male that presents today for staple removal of his right above-knee amputation as well as evaluation for peripheral arterial disease in his left lower extremity.  The patient underwent his ultrasound without significant issue however prior to his office visit the patient left the exam room and refused to return.  The patient noted that he only wanted his staples removed and he did not want anyone looking at his left leg.  When we offered to assess remove staples from his left lower extremity he became combative and belligerent and would not allow evaluation.  Today noninvasive studies reveal an ABI of 1.57 on the left with triphasic tibial artery waveforms and good toe waveforms.  Additional lower extremity arterial duplex reveals triphasic waveforms throughout the left lower extremity with some biphasic waveforms to the distal tibials.   Review of Systems  Unable to perform ROS: Psychiatric disorder      Objective:   Physical Exam Vitals reviewed.  Psychiatric:        Mood and Affect: Affect is angry.        Behavior: Behavior is aggressive and combative.        Judgment: Judgment is inappropriate.    BP 96/63   Pulse 92   Ht 5\' 10"  (1.778 m)   Wt 179 lb (81.2 kg)   BMI 25.68 kg/m   Past Medical History:  Diagnosis Date   Diabetes mellitus without complication (HCC)    Hypertension    Peripheral vascular disease (HCC)     Social History   Socioeconomic History   Marital status: Single    Spouse name: Not on file   Number of children: Not on file   Years of education: Not on file   Highest education level: Not on file  Occupational History   Not on file  Tobacco Use   Smoking status: Every Day    Types: Cigarettes   Smokeless tobacco: Never   Substance and Sexual Activity   Alcohol use: Yes    Comment: social, occassional   Drug use: No   Sexual activity: Not on file  Other Topics Concern   Not on file  Social History Narrative   Not on file   Social Determinants of Health   Financial Resource Strain: Not on file  Food Insecurity: Not on file  Transportation Needs: Not on file  Physical Activity: Not on file  Stress: Not on file  Social Connections: Not on file  Intimate Partner Violence: Not on file    Past Surgical History:  Procedure Laterality Date   AMPUTATION Right 01/15/2021   Procedure: AMPUTATION ABOVE KNEE;  Surgeon: 01/17/2021, MD;  Location: [facility] ORS;  Service: General;  Laterality: Right;   LEG SURGERY  August 20, 2014   Right Leg  BPG   LOWER EXTREMITY ANGIOGRAPHY Left 09/19/2020   Procedure: Lower Extremity Angiography;  Surgeon: 09/21/2020, MD;  Location: [facility] INVASIVE CV LAB;  Service: Cardiovascular;  Laterality: Left;   LOWER EXTREMITY ANGIOGRAPHY Right 10/10/2020   Procedure: Lower Extremity Angiography;  Surgeon: 10/12/2020, MD;  Location: [facility] INVASIVE CV LAB;  Service: Cardiovascular;  Laterality: Right;    Family History  Problem Relation Age of Onset  Hypertension Mother     Allergies  Allergen Reactions   Sildenafil     CBC Latest Ref Rng & Units 01/19/2021 01/18/2021 01/17/2021  WBC 4.0 - 10.5 K/uL 11.1(H) 13.3(H) 11.2(H)  Hemoglobin 13.0 - 17.0 g/dL 2.4(P) 8.0(D) 7.0(L)  Hematocrit 39.0 - 52.0 % 30.0(L) 27.9(L) 22.6(L)  Platelets 150 - 400 K/uL 534(H) 475(H) 432(H)      CMP     Component Value Date/Time   NA 137 01/19/2021 0920   K 3.5 01/19/2021 0920   CL 104 01/19/2021 0920   CO2 25 01/19/2021 0920   GLUCOSE 163 (H) 01/19/2021 0920   BUN 10 01/19/2021 0920   CREATININE 0.72 01/19/2021 0920   CALCIUM 8.2 (L) 01/19/2021 0920   PROT 5.2 (L) 01/12/2021 0818   ALBUMIN 1.8 (L) 01/12/2021 0818   AST 19 01/12/2021 0818   ALT 20 01/12/2021 0818   ALKPHOS 68  01/12/2021 0818   BILITOT 0.7 01/12/2021 0818   GFRNONAA >60 01/19/2021 0920   GFRAA >60 04/14/2019 1345     No results found.     Assessment & Plan:   1. Atherosclerosis of arteries of extremities (HCC) Patient has little evidence of PAD in the left lower extremity.  If the patient begins to experience claudication worsening wounds reevaluation is suggested.  2. Right above-knee amputee [facility]) The patient was extremely belligerent and would not allow me to allow his view his stump let alone remove staples.  The patient would not return to the exam room for examination.  Patient's ride was advised and facility was advised   Current Outpatient Medications on File Prior to Visit  Medication Sig Dispense Refill   acetaminophen (TYLENOL) 325 MG tablet Take 2 tablets (650 mg total) by mouth every 4 (four) hours as needed for headache or mild pain.     aspirin 81 MG tablet Take 1 tablet (81 mg total) by mouth daily. For prophylaxis 30 tablet 0   atorvastatin (LIPITOR) 20 MG tablet Take 4 tablets (80 mg total) by mouth daily. 30 tablet 0   clotrimazole (LOTRIMIN) 1 % cream Apply 1 application topically in the morning and at bedtime.     escitalopram (LEXAPRO) 10 MG tablet Take 10 mg by mouth daily.     feeding supplement (ENSURE ENLIVE / ENSURE PLUS) LIQD Take 237 mLs by mouth 3 (three) times daily between meals. 237 mL 12   gabapentin (NEURONTIN) 100 MG capsule Take 1 capsule (100 mg total) by mouth 3 (three) times daily. 90 capsule 0   haloperidol (HALDOL) 2 MG tablet Take 1 tablet (2 mg total) by mouth every 8 (eight) hours as needed for agitation. 15 tablet 0   No current facility-administered medications on file prior to visit.    There are no Patient Instructions on file for this visit. No follow-ups on file.   Georgiana Spinner, NP

## 2021-02-20 ENCOUNTER — Ambulatory Visit (INDEPENDENT_AMBULATORY_CARE_PROVIDER_SITE_OTHER): Payer: Medicare Other | Admitting: Nurse Practitioner

## 2021-05-26 ENCOUNTER — Non-Acute Institutional Stay: Payer: Medicare Other | Admitting: Nurse Practitioner

## 2021-05-26 ENCOUNTER — Other Ambulatory Visit: Payer: Self-pay

## 2021-05-26 ENCOUNTER — Encounter: Payer: Self-pay | Admitting: Nurse Practitioner

## 2021-05-26 VITALS — BP 132/51 | HR 78 | Temp 97.6°F | Resp 18 | Wt 152.9 lb

## 2021-05-26 DIAGNOSIS — E43 Unspecified severe protein-calorie malnutrition: Secondary | ICD-10-CM

## 2021-05-26 DIAGNOSIS — R5381 Other malaise: Secondary | ICD-10-CM

## 2021-05-26 DIAGNOSIS — Z515 Encounter for palliative care: Secondary | ICD-10-CM

## 2021-05-26 NOTE — Progress Notes (Signed)
[place] Consult Note Telephone: [phone]  Fax: [phone]    Date of encounter: 05/26/21 12:39 PM PATIENT NAME: Brad Frost [address] [place] [address]   [phone] (home)  DOB: 1955/09/09 MRN: [id number] PRIMARY CARE PROVIDER:    [facility]  RESPONSIBLE PARTY:    Contact Information     Name Relation Home Work Mobile   Lukasik,Daniel Brother [phone]     moore,patsy Sister [phone]        I met face to face with patient in facility. Palliative Care was asked to follow this patient by consultation request of  [facility] to address advance care planning and complex medical decision making. This is a follow up visit.                                  ASSESSMENT AND PLAN / RECOMMENDATIONS: Symptom Management/Plan: 1. Advance Care Planning; full code; wishes are full scope treatment  2. Debility secondary to right aka on 01/19/2021, decompensation, has been waiting for prosthetic to be more independent. Mr. Barthelemy endorses he has been better about residing at facility, participating, continue to try to encourage therapy, mobility, self independence. Discussed importance of cooperation, participating with staff, socialization  3. Protein calorie malnutrition; improving; discussed nutrition, reviewed meals, food likes; weights with current weight 152.9 lbs with 13.8 lbs weight gain since at the facility  02/03/2021 weight 139.1 lbs 04/29/2021 weight 152.9 lbs  4. Palliative care encounter; Palliative care encounter; Palliative medicine team will continue to support patient, patient's family, and medical team. Visit consisted of counseling and education dealing with the complex and emotionally intense issues of symptom management and palliative care in the setting of serious and potentially life-threatening illness  5.  f/u 8 weeks for ongoing monitoring chronic disease  progression, ongoing discussions complex medical decision making Follow up Palliative Care Visit: Palliative care will continue to follow for complex medical decision making, advance care planning, and clarification of goals. Return 8 weeks or prn.  I spent 38 minutes providing this consultation. More than 50% of the time in this consultation was spent in counseling and care coordination.  PPS: 40% Chief Complaint: Follow up palliative consult for complex medical decision making  HISTORY OF PRESENT ILLNESS:  IZEL EISENHARDT is a 66 y.o. year old male  with multiple medical problems including PVD, severe PAD, IDA, anemia of chronic disease, DM, BPH, h/o PE, tobacco abuse. 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 ED visit 01/22/2021 for dizziness, low bp, tachycardic at 111 for which he signed out AMA. Mr. Null is currently residing at [facility]. Mr. Marsalis is requiring assistance for mobility, transfers, adl's including bathing, dressing, toileting. Mr. Penson does feed himself, though per staff appetite has varied depending on what is being served. Staff endorses Mr. Lindahl is able to verbalize his needs. Mr. Patchell is a full code. At present, Mr. Catino is lying in bed. Mr. Hang appears comfortable. We talked about purpose of pc visit. Mr. Dalesandro in agreement. We talked about Mr. Shostak has been living independently prior to amputation; hospitalization with his wishes to return. Mr. Jallow endorses he has been waiting for prosthetic before he can do more self independence. We talked about importance of exercise, Mr. Mance endorses he has restarted therapy and has been participating .  We talked about pending prosthetic. We talked about symptoms, appetite, nutrition, appetite. Mr. Mazzuca endorses he would like to return home although he will need to be more independent than currently to be able to live by himself. We talked about  medical goals reviewed with wishes for full code, full scope of treatment. We talked about role pc in poc. Discussed will continue to follow while at facility. I updated staff.  .   History obtained from review of EMR, discussion with facility staff and Mr. Cislo.  I reviewed available labs, medications, imaging, studies and related documents from the EMR.  Records reviewed and summarized above.   ROS 10 point system reviewed with Mr Burdo and staff all negative except HPI  Physical Exam: Constitutional: NAD General: debilitated male EYES:  lids intact ENMT: oral mucous membranes moist CV: S1S2, RRR Pulmonary: LCTA, no increased work of breathing, no cough, room air Abdomen: normo-active BS + 4 quadrants, soft and non tender MSK: w/c dependent; right AKA Skin: warm and dry Neuro:  + generalized weakness,  no cognitive impairment Psych: non-anxious affect, A and O x 3 Thank you for the opportunity to participate in the care of Mr. Insley.  The palliative care team will continue to follow. Please call our office at [phone] if we can be of additional assistance.   This chart was dictated using voice recognition software.  Despite best efforts to proofread,  errors can occur which can change the documentation meaning.   Questions and concerns were addressed. Provided general support and encouragement, no other unmet needs identified   Omeed Osuna Ihor Gully, NP

## 2021-07-24 ENCOUNTER — Non-Acute Institutional Stay: Payer: Medicare Other | Admitting: Nurse Practitioner

## 2021-07-24 ENCOUNTER — Encounter: Payer: Self-pay | Admitting: Nurse Practitioner

## 2021-07-24 VITALS — BP 101/60 | HR 78 | Temp 98.4°F | Resp 18 | Wt 159.6 lb

## 2021-07-24 DIAGNOSIS — E43 Unspecified severe protein-calorie malnutrition: Secondary | ICD-10-CM

## 2021-07-24 DIAGNOSIS — R5381 Other malaise: Secondary | ICD-10-CM

## 2021-07-24 DIAGNOSIS — Z515 Encounter for palliative care: Secondary | ICD-10-CM

## 2021-07-24 NOTE — Progress Notes (Signed)
? ? ?[facility] ?Community Palliative [facility] Consult Note ?Telephone: [phone]  ?Fax: [phone]  ? ? ?Date of encounter: 07/24/21 ?4:37 PM ?PATIENT NAME: Brad Frost ?[place] [address]   [phone] (home)  ?DOB: November 03, 1955 ?MRN: [id number] ?PRIMARY CARE PROVIDER:    ?[facility] ? ?RESPONSIBLE PARTY:    ?Contact Information   ? ? Name Relation Home Work Mobile  ? Frost,Brad Brother [phone]    ? Frost,Brad Sister [phone]    ? ?  ? ?I met face to face with patient in facility. Palliative Care was asked to follow this patient by consultation request of  [facility] to address advance care planning and complex medical decision making. This is a follow up visit.                                  ?ASSESSMENT AND PLAN / RECOMMENDATIONS:  ?Symptom Management/Plan: ?1. Advance Care Planning; full code; wishes are full scope treatment ?  ?2. Debility secondary to right aka on 01/19/2021, decompensation, has been waiting for prosthetic to be more independent. Brad Frost endorses he has been better about residing at facility, participating, continue to try to encourage therapy, mobility, self independence. Discussed importance of cooperation, participating with staff, socialization ?  ?3. Protein calorie malnutrition; improving; discussed nutrition, reviewed meals, food likes; weights with current weight 159.6 lbs, education completed ?  ?02/03/2021 weight 139.1 lbs ?04/29/2021 weight 152.9 lbs ? 06/26/2021 weight 159.6 lbs ?4. Palliative care encounter; Palliative care encounter; Palliative medicine team will continue to support patient, patient's family, and medical team. Visit consisted of counseling and education dealing with the complex and emotionally intense issues of symptom management and palliative care in the setting of serious and potentially life-threatening illness ?  ?5.  f/u 8 weeks for ongoing monitoring chronic disease  progression, ongoing discussions complex medical decision making ? ?Follow up Palliative Care Visit: Palliative care will continue to follow for complex medical decision making, advance care planning, and clarification of goals. Return 8 weeks or prn. ? ?I spent 37 minutes providing this consultation starting at 11:15 am. More than 50% of the time in this consultation was spent in counseling and care coordination. ? ?PPS: 50% ? ?Chief Complaint: Follow up palliative consult for complex medical decision making ? ?HISTORY OF PRESENT ILLNESS:  Brad Frost is a 66 y.o. year old male  with multiple medical problems including PVD, severe PAD, IDA, anemia of chronic disease, DM, BPH, h/o PE, tobacco abuse. 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 ED visit 01/22/2021 for dizziness, low bp, tachycardic at 111 for which he signed out AMA. Brad Frost is currently residing at [facility]. Brad Frost is requiring assistance for mobility, transfers, adl's including bathing, dressing, toileting. Brad Frost does feed himself, though per staff appetite has varied depending on what is being served. Staff endorses Brad Frost is able to verbalize his needs. Brad Frost is a full code. At present, Brad Frost is lying in bed. Brad Frost appears comfortable. We talked about purpose of pc visit. Brad Frost in agreement. We talked about Brad Frost working with therapy as he has not received his  prosthetic. Mr Frost endorses he has more work to do with therapy. We talked about importance of exercise, We talked about symptoms, appetite, nutrition, appetite. Brad Frost  endorses he would like to return home although he will need to be more independent than currently to be able to live by himself. We talked about medical goals reviewed with wishes for full code, full scope of treatment. We talked about role pc in poc. Discussed will continue to follow while at facility.  I updated staff.  .  .  ? ?History obtained from review of EMR, discussion with facility staff and  Brad Frost.  ?I reviewed available labs, medications, imaging, studies and related documents from the EMR.  Records reviewed and summarized above.  ? ?ROS ?10 point ros all negative except HPI ? ?Physical Exam: ?Constitutional: NAD ?General: frail appearing, pleasant male ?EYES: anicteric sclera, lids intact, no discharge  ?ENMT: intact hearing, oral mucous membranes moist, dentition intact ?CV: S1S2, RRR ?Pulmonary: LCTA, no increased work of breathing, no cough, room air ?Abdomen: normo-active BS + 4 quadrants, soft and non tender ?MSK: w/c dependent ?Skin: warm and dry ?Neuro:  + generalized weakness,  no cognitive impairment ?Psych: non-anxious affect, A and O x 3 ?Thank you for the opportunity to participate in the care of Brad Frost.  The palliative care team will continue to follow. Please call our office at [phone] if we can be of additional assistance.  ? ?Xaivier Malay Z Sidra Oldfield, NP    ?

## 2021-09-17 DIAGNOSIS — Z23 Encounter for immunization: Secondary | ICD-10-CM | POA: Diagnosis not present

## 2021-11-16 ENCOUNTER — Non-Acute Institutional Stay: Payer: Medicare Other | Admitting: Nurse Practitioner

## 2021-11-16 ENCOUNTER — Encounter: Payer: Self-pay | Admitting: Nurse Practitioner

## 2021-11-16 VITALS — BP 111/58 | HR 84 | Temp 98.0°F | Resp 18 | Wt 178.4 lb

## 2021-11-16 DIAGNOSIS — R5381 Other malaise: Secondary | ICD-10-CM

## 2021-11-16 DIAGNOSIS — E43 Unspecified severe protein-calorie malnutrition: Secondary | ICD-10-CM

## 2021-11-16 DIAGNOSIS — Z515 Encounter for palliative care: Secondary | ICD-10-CM

## 2021-11-16 NOTE — Progress Notes (Signed)
Therapist, nutritional Palliative Care Consult Note Telephone: [phone]  Fax: [phone]    Date of encounter: 11/16/21 3:00 PM PATIENT NAME: Brad Frost [address] Brad Frost [place] [address]   [phone] (home)  DOB: June 10, 1955 MRN: [id number] PRIMARY CARE PROVIDER:   [facility] [name], [name], Frost,  [address] [place] [address] [phone]  RESPONSIBLE PARTY:    Contact Information     Name Relation Home Work [place] Brother [phone]     moore,patsy Sister [phone]                  I met face to face with patient in facility. Palliative Care was asked to follow this patient by consultation request of  Brad Frost to address advance care planning and complex medical decision making. This is a follow up visit.                                  ASSESSMENT AND PLAN / RECOMMENDATIONS:  Symptom Management/Plan: 1. Advance Care Planning; full code; wishes are full scope treatment   2. Debility secondary to right aka on 01/19/2021, improving, has been using prosthetic to be more independent, few steps with therapy. Continue to try to encourage therapy, mobility, self independence. Discussed importance of cooperation, participating with staff, socialization   3. Protein calorie malnutrition; continues to improve with weight gain discussed nutrition, reviewed meals, food likes; education completed   02/03/2021 weight 139.1 lbs 04/29/2021 weight 152.9 lbs 06/26/2021 weight 159.6 lbs 11/04/2021 weight 178.4 lbs BMI 25.6  4. Palliative care encounter; Palliative care encounter; Palliative medicine team will continue to support patient, patient's family, and medical team. Visit consisted of counseling and education dealing with the complex and emotionally intense issues of symptom management and palliative care in the setting of serious and potentially life-threatening illness   5.  f/u 8 weeks for  ongoing monitoring chronic disease progression, ongoing discussions complex medical decision making   Follow up Palliative Care Visit: Palliative care will continue to follow for complex medical decision making, advance care planning, and clarification of goals. Return 8 weeks or prn.   I spent 36 minutes providing this consultation starting at 12:15pm. More than 50% of the time in this consultation was spent in counseling and care coordination.   PPS: 50%   Chief Complaint: Follow up palliative consult for complex medical decision making   HISTORY OF PRESENT ILLNESS:  Brad Frost is a 66 y.o. year old male  with multiple medical problems including PVD, severe PAD, IDA, anemia of chronic disease, DM, BPH, h/o PE, tobacco abuse. 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 ED visit 01/22/2021 for dizziness, low bp, tachycardic at 111 for which he signed out AMA. Brad Frost is currently residing at [facility]. Brad Frost is requiring assistance for mobility, transfers, adl's including bathing, dressing, toileting. Brad Frost does feeds himself with improving appetite, weight gain. Staff endorses no recent falls, wounds, hospitalizations, infections. Staff endorses Brad Frost has received his prostetic and has been using in therapy, attempting to ambulate. Staff endorses Brad Frost is able to verbalize his needs. Brad Frost is a full code. At present, Brad Frost sitting in w/c sleeping, awoke to verbal cues. We talked about purpose of pc visit. Brad Frost in agreement. We talked about  Brad Frost working with therapy with his prosthetic, he is very proud of his progress. Brad Frost endorses he has more work to do with therapy. We talked about importance of exercise, We talked about symptoms, appetite, nutrition, appetite, weight gain. We talked about medical goals reviewed with wishes for full code, full scope of treatment. We talked  about role pc in poc. Discussed will continue to follow while at facility. I updated staff.  .  .    History obtained from review of EMR, discussion with facility staff and  Brad Frost.  I reviewed available labs, medications, imaging, studies and related documents from the EMR.  Records reviewed and summarized above.    ROS 10 point ros all negative except HPI   Physical Exam: Constitutional: NAD General: pleasant male EYES: anicteric sclera, lids intact, no discharge  ENMT: intact hearing, oral mucous membranes moist, dentition intact CV: S1S2, RRR Pulmonary: LCTA, no increased work of breathing, no cough, room air Abdomen: normo-active BS + 4 quadrants, soft and non tender MSK: w/c dependent; right AKA Skin: warm and dry Neuro:  + generalized weakness,  no cognitive impairment Psych: non-anxious affect, A and O x 3                            Thank you for the opportunity to participate in the care of Brad Frost.  The palliative care team will continue to follow. Please call our office at [phone] if we can be of additional assistance.   Avontae Burkhead Ihor Gully, NP

## 2022-01-04 ENCOUNTER — Non-Acute Institutional Stay: Payer: Medicare Other | Admitting: Nurse Practitioner

## 2022-01-04 ENCOUNTER — Encounter: Payer: Self-pay | Admitting: Nurse Practitioner

## 2022-01-04 VITALS — BP 95/65 | HR 76 | Temp 97.1°F | Resp 18 | Wt 176.5 lb

## 2022-01-04 DIAGNOSIS — R5381 Other malaise: Secondary | ICD-10-CM

## 2022-01-04 DIAGNOSIS — Z515 Encounter for palliative care: Secondary | ICD-10-CM

## 2022-01-04 DIAGNOSIS — E43 Unspecified severe protein-calorie malnutrition: Secondary | ICD-10-CM

## 2022-01-04 NOTE — Progress Notes (Signed)
Therapist, nutritional Palliative Care Consult Note Telephone: [phone]  Fax: [phone]    Date of encounter: 01/04/22 2:01 PM PATIENT NAME: Brad Frost [address] [place] [address]   [phone] (home)  DOB: October 25, 1955 MRN: [id number] PRIMARY CARE PROVIDER:   [facility] [name], [name], [place],  [address] [place] [address] [phone]  RESPONSIBLE PARTY:    Contact Information     Name Relation Home Work [place] Brother [phone]     moore,patsy Sister [phone]         I met face to face with patient in facility. Palliative Care was asked to follow this patient by consultation request of  [facility] to address advance care planning and complex medical decision making. This is a follow up visit.                                  ASSESSMENT AND PLAN / RECOMMENDATIONS:  Symptom Management/Plan: 1. Advance Care Planning; full code; wishes are full scope treatment   2. Debility secondary to right aka on 01/19/2021, improving, has been using prosthetic to be more independent, few steps with therapy. Continue to try to encourage therapy, mobility, self independence. Discussed importance of cooperation, participating with staff, socialization   3. Protein calorie malnutrition; slight weight loss; though remains improved since initially residing at [facility];  discussed nutrition, reviewed meals, food likes; education completed   02/03/2021 weight 139.1 lbs 04/29/2021 weight 152.9 lbs 06/26/2021 weight 159.6 lbs 11/04/2021 weight 178.4 lbs 12/31/2021 weight 176.5 lbs 4. Palliative care encounter; Palliative care encounter; Palliative medicine team will continue to support patient, patient's family, and medical team. Visit consisted of counseling and education dealing with the complex and emotionally intense issues of symptom management and palliative care in the setting of serious and potentially  life-threatening illness   5.  f/u 8 weeks for ongoing monitoring chronic disease progression, ongoing discussions complex medical decision making   Follow up Palliative Care Visit: Palliative care will continue to follow for complex medical decision making, advance care planning, and clarification of goals. Return 8 weeks or prn.   I spent 46 minutes providing this consultation starting at 12:30 pm. More than 50% of the time in this consultation was spent in counseling and care coordination.   PPS: 50%   Chief Complaint: Follow up palliative consult for complex medical decision making   HISTORY OF PRESENT ILLNESS:  AUNDRAY CARTLIDGE is a 66 y.o. year old male  with multiple medical problems including PVD, severe PAD, IDA, anemia of chronic disease, DM, BPH, h/o PE, tobacco abuse. Brad Frost resides LTC at [facility]. Brad Frost is requiring assistance for mobility, transfers, adl's including bathing, dressing, toileting. Brad Frost feeds himself, good appetite, weight gain. Staff endorses no recent falls, wounds, hospitalizations, infections. Staff endorses Brad Frost has received his prostetic and has been using in therapy, attempting to ambulate. Staff endorses Brad Frost is able to verbalize his needs. Brad Frost is a full code. At present, Brad Frost sitting in w/c, getting ready to eat lunch. We talked about purpose of pc visit. Brad Frost in agreement. We talked about his lunch, his appetite, foods he likes, nutrition discussed. We talked about mobility, using his prosthetic with goc.  We talked about ros, functional abilities, facility living. No new changes. Medical goals reviewed. We talked about  role pc in poc. Discussed will continue to follow while at facility. I updated staff.  .  .    History obtained from review of EMR, discussion with facility staff and  Brad Frost.  I reviewed available labs, medications, imaging, studies and related documents from the EMR.  Records reviewed  and summarized above.    ROS 10 point ros all negative except HPI   Physical Exam: Constitutional: NAD General: pleasant male EYES: lids intact ENMT: oral mucous membranes moist CV: S1S2, RRR Pulmonary: LCTA, room air Abdomen: normo-active BS + 4 quadrants, soft and non tender MSK: w/c dependent; right AKA; has prosthetic  Skin: warm and dry Neuro:  + generalized weakness,  no cognitive impairment Psych: non-anxious affect, A and O x 3         Thank you for the opportunity to participate in the care of Mr. Michelle.  The palliative care team will continue to follow. Please call our office at [phone] if we can be of additional assistance.   Araly Kaas Ihor Gully, NP

## 2022-02-25 ENCOUNTER — Non-Acute Institutional Stay: Payer: Medicare Other | Admitting: Nurse Practitioner

## 2022-02-25 ENCOUNTER — Encounter: Payer: Self-pay | Admitting: Nurse Practitioner

## 2022-02-25 VITALS — HR 78 | Temp 97.0°F | Resp 18 | Wt 179.1 lb

## 2022-02-25 DIAGNOSIS — Z515 Encounter for palliative care: Secondary | ICD-10-CM

## 2022-02-25 DIAGNOSIS — E43 Unspecified severe protein-calorie malnutrition: Secondary | ICD-10-CM

## 2022-02-25 DIAGNOSIS — R5381 Other malaise: Secondary | ICD-10-CM

## 2022-02-25 NOTE — Progress Notes (Signed)
[facility] Consult Note Telephone: [phone]  Fax: [phone]    Date of encounter: 02/25/22 6:25 PM PATIENT NAME: Brad Frost  RESPONSIBLE PARTY:    Contact Information     Name Relation Home Work Mobile   Zerby,Daniel Brother [phone]     moore,patsy Sister [phone]         Thank you for the opportunity to participate in the care I met face to face with patient in facility. Palliative Care was asked to follow this patient by consultation request of  [facility] to address advance care planning and complex medical decision making. This is a follow up visit.                                  ASSESSMENT AND PLAN / RECOMMENDATIONS:  Symptom Management/Plan: 1. Advance Care Planning; full code; wishes are full scope treatment   2. 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 Continue to try to encourage mobility, self independence. Discussed importance of cooperation, participating with staff, socialization   3. Protein calorie malnutrition; weight gain; improving; discussed nutrition, reviewed meals, food likes; education completed   02/03/2021 weight 139.1 lbs 04/29/2021 weight 152.9 lbs 06/26/2021 weight 159.6 lbs 11/04/2021 weight 178.4 lbs 12/31/2021 weight 176.5 lbs 01/25/2022 weight 179.1 lbs 4. Palliative care encounter; Palliative care encounter; Palliative medicine team will continue to support patient, patient's family, and medical team. Visit consisted of counseling and education dealing with the complex and emotionally intense issues of symptom management and palliative care in the setting of serious and potentially life-threatening illness   5.  f/u 8 weeks for ongoing monitoring chronic disease progression, ongoing discussions complex medical decision making   Follow up Palliative Care  Visit: Palliative care will continue to follow for complex medical decision making, advance care planning, and clarification of goals. Return 8 weeks or prn.   I spent 36 minutes providing this consultation starting at 1:00 pm. More than 50% of the time in this consultation was spent in counseling and care coordination.   PPS: 50%   Chief Complaint: Follow up palliative consult for complex medical decision making   HISTORY OF PRESENT ILLNESS:  Brad Frost is a 66 y.o. year old male  with multiple medical problems including PVD, severe PAD, IDA, anemia of chronic disease, DM, BPH, h/o PE, tobacco abuse. Brad Frost resides LTC at [facility]. Brad Frost is requiring assistance for mobility, transfers, adl's including bathing, dressing, toileting. Brad Frost feeds himself, good appetite. Staff endorses no recent falls, wounds, hospitalizations, infections. At present Mr Frost is lying in bed, appears comfortable. No visitors present. Mr Frost and I talked about purpose of pc visit, Mr Canion in agreeable. We talked about ros, his functional abilities, using his prosthetic with good results. We talked about his daily routine. Mr Frost continues to be stable.   No new changes. Medical goals reviewed. We talked about role pc in poc. No new changes to goc. Discussed will continue to follow while at facility. I updated staff.   History obtained from review of EMR, discussion with facility staff and  Brad Frost.  I reviewed available labs, medications, imaging, studies and related documents from the EMR.  Records reviewed and summarized above.    ROS 10 point ros all negative  except HPI   Physical Exam: Constitutional: NAD General: pleasant male EYES: lids intact ENMT: oral mucous membranes moist CV: S1S2, RRR Pulmonary: LCTA, room air Abdomen: normo-active BS + 4 quadrants, soft and non tender MSK: w/c dependent; right AKA; has prosthetic  Skin: warm and dry Neuro:  + generalized  weakness,  no cognitive impairment Psych: non-anxious affect, A and O x 3     Brad Frost Reaper.  The palliative care team will continue to follow. Please call our office at [phone] if we can be of additional assistance.   Demaris Bousquet Ihor Gully, NP

## 2022-05-24 ENCOUNTER — Encounter: Payer: Self-pay | Admitting: Nurse Practitioner

## 2022-05-24 ENCOUNTER — Non-Acute Institutional Stay: Payer: Medicare Other | Admitting: Nurse Practitioner

## 2022-05-24 VITALS — BP 122/69 | HR 92 | Temp 98.1°F | Resp 18 | Wt 192.1 lb

## 2022-05-24 DIAGNOSIS — I739 Peripheral vascular disease, unspecified: Secondary | ICD-10-CM

## 2022-05-24 DIAGNOSIS — Z515 Encounter for palliative care: Secondary | ICD-10-CM

## 2022-05-24 DIAGNOSIS — R5381 Other malaise: Secondary | ICD-10-CM

## 2022-05-24 DIAGNOSIS — E43 Unspecified severe protein-calorie malnutrition: Secondary | ICD-10-CM

## 2022-05-24 NOTE — Progress Notes (Signed)
[facility] Consult Note Telephone: [phone]  Fax: [phone]    Date of encounter: 05/24/22 8:22 PM PATIENT NAME: Brad Frost [address] [place] [address]   [phone] (home)  DOB: Mar 11, 1956 MRN: [id number] PRIMARY CARE PROVIDER:    [facility]  RESPONSIBLE PARTY:    Contact Information     Name Relation Home Work Mobile   Frost,Brad Brother [phone]     Frost,Brad Sister [phone]       I met face to face with patient in facility. Palliative Care was asked to follow this patient by consultation request of  [facility] to address advance care planning and complex medical decision making. This is a follow up visit.                                  ASSESSMENT AND PLAN / RECOMMENDATIONS:  Symptom Management/Plan: 1. Advance Care Planning; full code; wishes are full scope treatment   2. 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 Continue to try to encourage mobility, self independence. Discussed importance of cooperation, participating with staff, socialization   3. Protein calorie malnutrition; weight gain; stable; discussed nutrition, reviewed meals, food likes; education completed   02/03/2021 weight 139.1 lbs 04/29/2021 weight 152.9 lbs 06/26/2021 weight 159.6 lbs 11/04/2021 weight 178.4 lbs 12/31/2021 weight 176.5 lbs 01/25/2022 weight 179.1 lbs 04/28/2022 weight 192.1 lbs 4. Palliative care encounter; Palliative care encounter; Palliative medicine team will continue to support patient, patient's family, and medical team. Visit consisted of counseling and education dealing with the complex and emotionally intense issues of symptom management and palliative care in the setting of serious and potentially life-threatening illness   5.  f/u 8 weeks for ongoing monitoring chronic disease  progression, ongoing discussions complex medical decision making   Follow up Palliative Care Visit: Palliative care will continue to follow for complex medical decision making, advance care planning, and clarification of goals. Return 8 weeks or prn.   I spent 46 minutes providing this consultation starting at 10:30am. More than 50% of the time in this consultation was spent in counseling and care coordination.   PPS: 50%   Chief Complaint: Follow up palliative consult for complex medical decision making   HISTORY OF PRESENT ILLNESS:  Brad Frost is a 67 y.o. year old male  with multiple medical problems including PVD, severe PAD, IDA, anemia of chronic disease, DM, BPH, h/o PE, tobacco abuse. Brad Frost resides LTC at [facility]. Brad Frost is requiring assistance for mobility, transfers, adl's including bathing, dressing, toileting. Brad Frost feeds himself, good appetite. Staff endorses no recent falls, wounds, hospitalizations, infections. Purpose of today PC f/u visit further discussion monitor trends of appetite, weights, monitor for functional, cognitive decline with chronic disease progression, assess any active symptoms, supportive role.At present Brad Frost is lying in bed, appears comfortable. No visitors present. Brad Frost and I talked about purpose of pc visit, Brad Frost in agreeable. We talked about ros, functional abilities, we talked about his prosthetic, working with therapy, taking steps. We talked about appetite, foods he likes. We talked about residing at facility, quality of life, daily routine, coping strategies. We talked about things that bring him joy. Brad Frost was very interactive, engaging, talkative. Brad Frost was cooperative. Praised Brad Frost for motivation.  We talked about socialization, engaging in activities, minimize napping. We talked about sleep patterns, sleep hygiene. Talked about role pc in poc. Medical goals, medications, poc reviewed. At present time Brad  Frost is stable, PC f/u visit further discussion monitor trends of appetite, weights, monitor for functional, cognitive decline with chronic disease progression, assess any active symptoms, supportive role. Updated staff, no changes to poc.   History obtained from review of EMR, discussion with facility staff and  Brad Frost.  I reviewed available labs, medications, imaging, studies and related documents from the EMR.  Records reviewed and summarized above.    Physical Exam: General: pleasant male ENMT: oral mucous membranes moist CV: S1S2, RRR Pulmonary: LCTA MSK: w/c dependent; right AKA; has prosthetic  Skin: warm and dry Neuro:  + generalized weakness,  no cognitive impairment Psych: non-anxious affect, A and O x 3     Thank you for the opportunity to participate in the care of Brad. Frost. Please call our office at [phone] if we can be of additional assistance.   Sallyann Kinnaird Ihor Gully, NP

## 2022-06-21 ENCOUNTER — Non-Acute Institutional Stay: Payer: Medicare Other | Admitting: Nurse Practitioner

## 2022-06-21 ENCOUNTER — Encounter: Payer: Self-pay | Admitting: Nurse Practitioner

## 2022-06-21 VITALS — BP 122/69 | HR 92 | Temp 98.1°F | Resp 18 | Wt 183.9 lb

## 2022-06-21 DIAGNOSIS — Z515 Encounter for palliative care: Secondary | ICD-10-CM

## 2022-06-21 DIAGNOSIS — R5381 Other malaise: Secondary | ICD-10-CM

## 2022-06-21 DIAGNOSIS — E43 Unspecified severe protein-calorie malnutrition: Secondary | ICD-10-CM

## 2022-06-21 DIAGNOSIS — I739 Peripheral vascular disease, unspecified: Secondary | ICD-10-CM

## 2022-06-21 NOTE — Progress Notes (Signed)
[facility] Consult Note Telephone: [phone]  Fax: [phone]    Date of encounter: 06/21/22 1:19 PM PATIENT NAME: Brad Frost [address] [place] [address]   [phone] (home)  DOB: 1956/04/02 MRN: [id number] PRIMARY CARE PROVIDER:    [facility]  RESPONSIBLE PARTY:    Contact Information     Name Relation Home Work Mobile   Stork,Daniel Brother [phone]     moore,patsy Sister [phone]       I met face to face with patient in facility. Palliative Care was asked to follow this patient by consultation request of  [facility] to address advance care planning and complex medical decision making. This is a follow up visit.                                  ASSESSMENT AND PLAN / RECOMMENDATIONS:  Symptom Management/Plan: 1. Advance Care Planning; full code; wishes are full scope treatment   2. 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 Continue to try to encourage mobility, self independence. Discussed importance of cooperation, participating with staff, socialization   3. Protein calorie malnutrition; weight gain; stable; discussed nutrition, reviewed meals, food likes; education completed  01/25/2022 weight 179.1 lbs 04/28/2022 weight 192.1 lbs 05/31/2022 weight 183.9 lbs 8.2 lbs/4 weeks  4. Palliative care encounter; Palliative care encounter; Palliative medicine team will continue to support patient, patient's family, and medical team. Visit consisted of counseling and education dealing with the complex and emotionally intense issues of symptom management and palliative care in the setting of serious and potentially life-threatening illness   Follow up Palliative Care Visit: PC f/u visit further discussion monitor trends of appetite, weights, monitor for functional, cognitive decline with  chronic disease progression, assess any active symptoms, supportive role.Palliative care will continue to follow for complex medical decision making, advance care planning, and clarification of goals. Return 4 to 8 weeks or prn.   I spent 47 minutes providing this consultation starting at 1:15 pm. More than 50% of the time in this consultation was spent in counseling and care coordination.   PPS: 50%   Chief Complaint: Follow up palliative consult for complex medical decision making   HISTORY OF PRESENT ILLNESS:  Brad Frost is a 67 y.o. year old male  with multiple medical problems including PVD, severe PAD, IDA, anemia of chronic disease, DM, BPH, h/o PE, tobacco abuse. Brad Frost resides LTC at [facility]. Brad Frost is requiring assistance for mobility, transfers, adl's including bathing, dressing, toileting. Brad Frost feeds himself, good appetite. Staff endorses no recent falls, wounds, hospitalizations, infections. Purpose of today PC f/u visit further discussion monitor trends of appetite, weights, monitor for functional, cognitive decline with chronic disease progression, assess any active symptoms, supportive role. I visited and observed Mr Frost lying in bed, sleeping, awoke to verbal cures. We talked about how he has been feeling, ros, no pain, using prosthetic, going to gym and asking for help to put prosthetic on. We talked about appetite, weight, what brings him joy, residing at facility, quality of life, chronic disease progression of PVD requiring LTC placement. We talked about daily routine, goals he has set for himself. We talked about concerns he had with loss of independence with coping strategies. Supportive visit, medications, medical goals, poc  reviewed. Updated staff, recommended no daytime naps, engage in more activities and dining area for meals, go to gym for exercise.    History obtained from review of EMR, discussion with facility staff and  Mr. Merryweather.  I  reviewed available labs, medications, imaging, studies and related documents from the EMR.  Records reviewed and summarized above.    Physical Exam: General: pleasant male ENMT: oral mucous membranes moist CV: S1S2, RRR Pulmonary: LCTA MSK: w/c dependent; right AKA; has prosthetic  Skin: warm and dry Psych: non-anxious affect, A and O x 3     Thank you for the opportunity to participate in the care of Brad Frost. Please call our office at [phone] if we can be of additional assistance.   Rashauna Tep Ihor Gully, NP

## 2022-08-09 ENCOUNTER — Encounter: Payer: Self-pay | Admitting: Nurse Practitioner

## 2022-08-09 ENCOUNTER — Non-Acute Institutional Stay: Payer: Medicare Other | Admitting: Nurse Practitioner

## 2022-08-09 VITALS — BP 108/62 | HR 85 | Temp 97.9°F | Resp 18 | Wt 181.2 lb

## 2022-08-09 DIAGNOSIS — I739 Peripheral vascular disease, unspecified: Secondary | ICD-10-CM

## 2022-08-09 DIAGNOSIS — R5381 Other malaise: Secondary | ICD-10-CM

## 2022-08-09 DIAGNOSIS — E43 Unspecified severe protein-calorie malnutrition: Secondary | ICD-10-CM

## 2022-08-09 DIAGNOSIS — Z515 Encounter for palliative care: Secondary | ICD-10-CM

## 2022-08-09 NOTE — Progress Notes (Signed)
[facility] Consult Note Telephone: [phone]  Fax: [phone]    Date of encounter: 08/09/22 9:05 PM PATIENT NAME: Brad Frost [address] Brad Frost [place] [address]   [phone] (home)  DOB: 1955/12/19 MRN: [id number] PRIMARY CARE PROVIDER:    [facility]  RESPONSIBLE PARTY:    Contact Information     Name Relation Home Work Mobile   Brad Frost Brother [phone]     Brad Frost Sister [phone]       I met face to face with patient in facility. Palliative Care was asked to follow this patient by consultation request of  [facility] to address advance care planning and complex medical decision making. This is a follow up visit.                                  ASSESSMENT AND PLAN / RECOMMENDATIONS:  Symptom Management/Plan: 1. Advance Care Planning; full code; wishes are full scope treatment   2. 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 Continue to try to encourage mobility, self independence. Discussed importance of cooperation, participating with staff, socialization   3. Protein calorie malnutrition; slight weight loss; continue to weight; discussed nutrition, reviewed meals, food likes; education completed   01/25/2022 weight 179.1 lbs 04/28/2022 weight 192.1 lbs 05/31/2022 weight 183.9 lbs 07/30/2022 weight 181.2 lbs   4. Palliative care encounter; Palliative care encounter; Palliative medicine team will continue to support patient, patient's family, and medical team. Visit consisted of counseling and education dealing with the complex and emotionally intense issues of symptom management and palliative care in the setting of serious and potentially life-threatening illness   Follow up Palliative Care Visit: PC f/u visit further discussion monitor trends of appetite, weights, monitor for  functional, cognitive decline with chronic disease progression, assess any active symptoms, supportive role.Palliative care will continue to follow for complex medical decision making, advance care planning, and clarification of goals. Return 4 to 8 weeks or prn.   I spent 47 minutes providing this consultation starting at 1:15 pm. More than 50% of the time in this consultation was spent in counseling and care coordination.   PPS: 50%   Chief Complaint: Follow up palliative consult for complex medical decision making   HISTORY OF PRESENT ILLNESS:  Brad Frost is a 67 y.o. year old male  with multiple medical problems including PVD, severe PAD, IDA, anemia of chronic disease, DM, BPH, h/o PE, tobacco abuse. Brad Frost resides LTC at [facility] Brad Frost is requiring assistance for mobility, transfers, adl's including bathing, dressing, toileting. Brad Frost feeds himself, good appetite. Staff endorses no recent falls, wounds, hospitalizations, infections. Purpose of today PC f/u visit further discussion monitor trends of appetite, weights, monitor for functional, cognitive decline with chronic disease progression, assess any active symptoms, supportive role. I visited and observed Brad Frost sitting in the w/c in his room wearing his prosthetic. Brad Frost was smiling, engaging; we talked about ros, functional abilities, how he has been feeling which he replied good. Brad Frost endorses he has been socializing, participating in facility activities, going out to smoke a few times a week. Declines smoking cessation. We talked about appetite which has been good, foods he likes, nutrition; we talked about his daily routine. We talked about life at facility, quality of  life; We talked about what brings him joy. Encouraged Brad Frost to continue to work with his prosthetic, exercise, mobility, fall risk. Medications, goc, poc reviewed. Currently Brad Frost is stable. Supportive visit, updated staff.     History obtained from review of EMR, discussion with facility staff and  Brad Frost.  I reviewed available labs, medications, imaging, studies and related documents from the EMR.  Records reviewed and summarized above.    Physical Exam: General: pleasant male ENMT: oral mucous membranes moist CV: S1S2, RRR Pulmonary: LCTA MSK: w/c dependent; right AKA; has prosthetic  Skin: warm and dry Psych: non-anxious affect, A and O x 3      Thank you for the opportunity to participate in the care of Brad Frost. Please call our office at [phone] if we can be of additional assistance.   Brad Frost Brad Rome, NP

## 2022-10-18 ENCOUNTER — Other Ambulatory Visit (INDEPENDENT_AMBULATORY_CARE_PROVIDER_SITE_OTHER): Payer: Self-pay | Admitting: Nurse Practitioner

## 2022-10-18 DIAGNOSIS — I70269 Atherosclerosis of native arteries of extremities with gangrene, unspecified extremity: Secondary | ICD-10-CM

## 2022-10-18 DIAGNOSIS — Z89611 Acquired absence of right leg above knee: Secondary | ICD-10-CM

## 2022-10-21 ENCOUNTER — Ambulatory Visit (INDEPENDENT_AMBULATORY_CARE_PROVIDER_SITE_OTHER): Payer: Medicare Other | Admitting: Nurse Practitioner

## 2022-10-21 ENCOUNTER — Encounter (INDEPENDENT_AMBULATORY_CARE_PROVIDER_SITE_OTHER): Payer: Medicare Other

## 2022-12-17 ENCOUNTER — Ambulatory Visit (INDEPENDENT_AMBULATORY_CARE_PROVIDER_SITE_OTHER): Payer: Medicare Other | Admitting: Nurse Practitioner

## 2022-12-17 ENCOUNTER — Encounter (INDEPENDENT_AMBULATORY_CARE_PROVIDER_SITE_OTHER): Payer: Medicare Other

## 2023-05-12 IMAGING — DX DG FOOT COMPLETE 3+V*R*
3 series · 3 of 3 positions shown · non-contrast
Comparison: None.

CLINICAL DATA: Chronic right foot wound with maggot infestation.

EXAM:
RIGHT FOOT COMPLETE - 3+ VIEW

[foot ap]
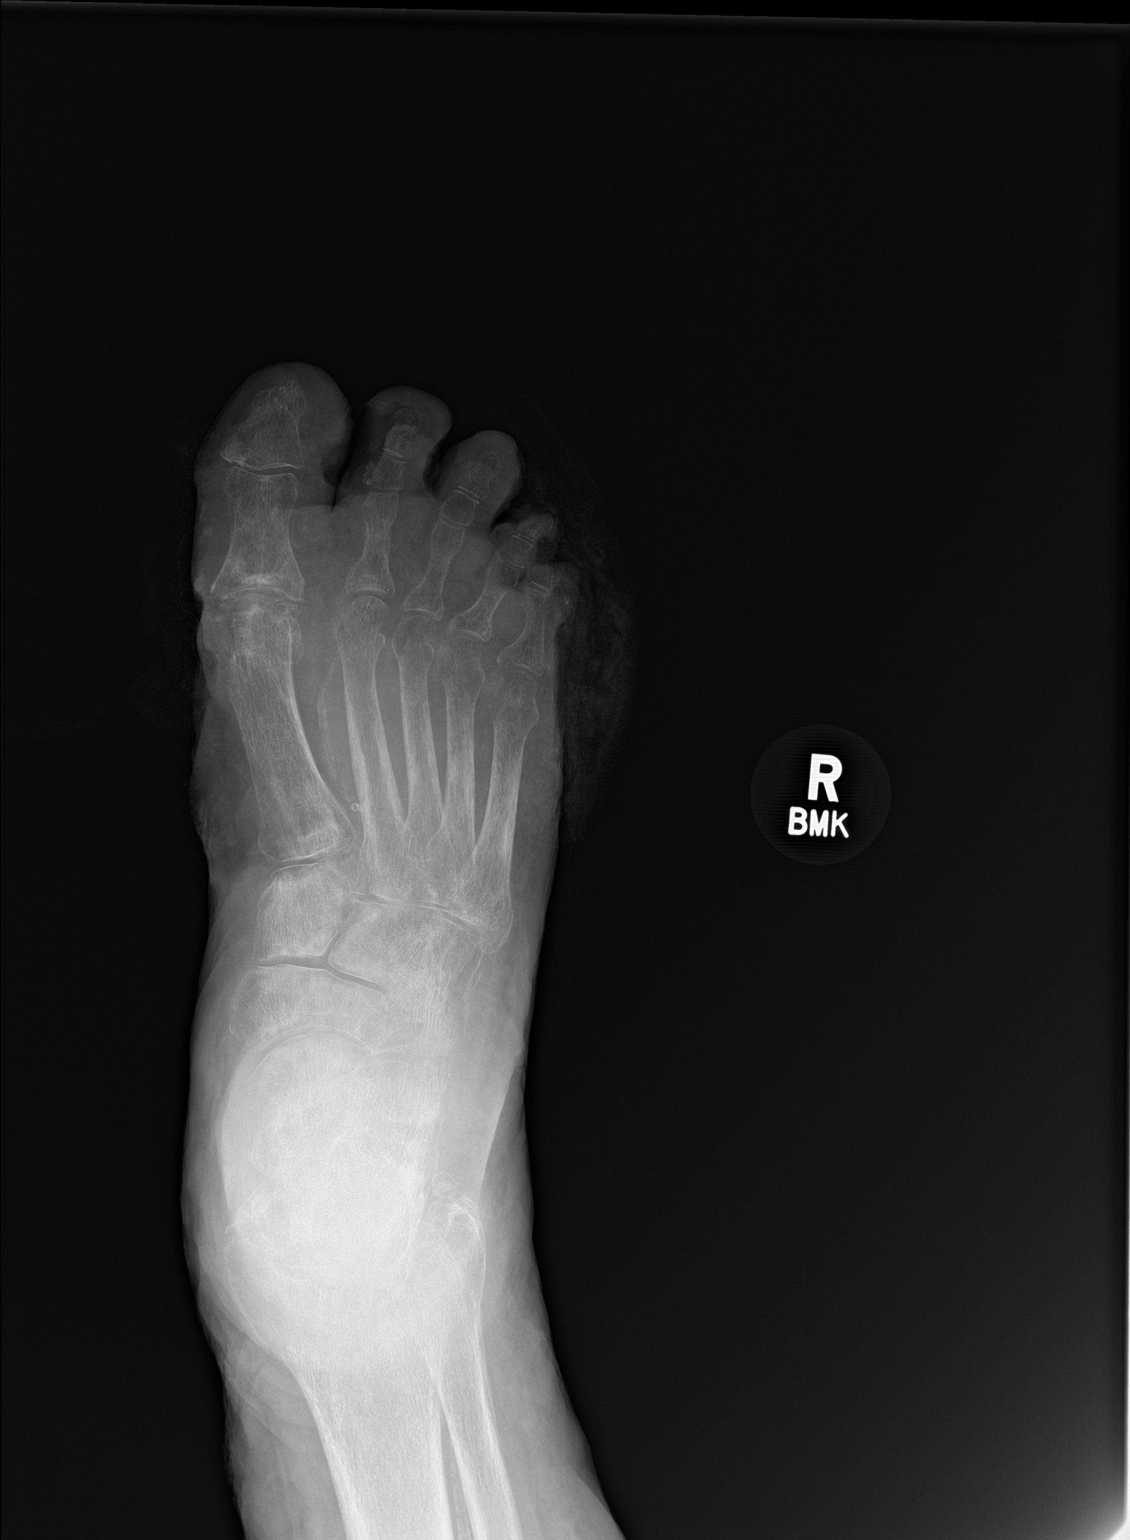

[foot obl]
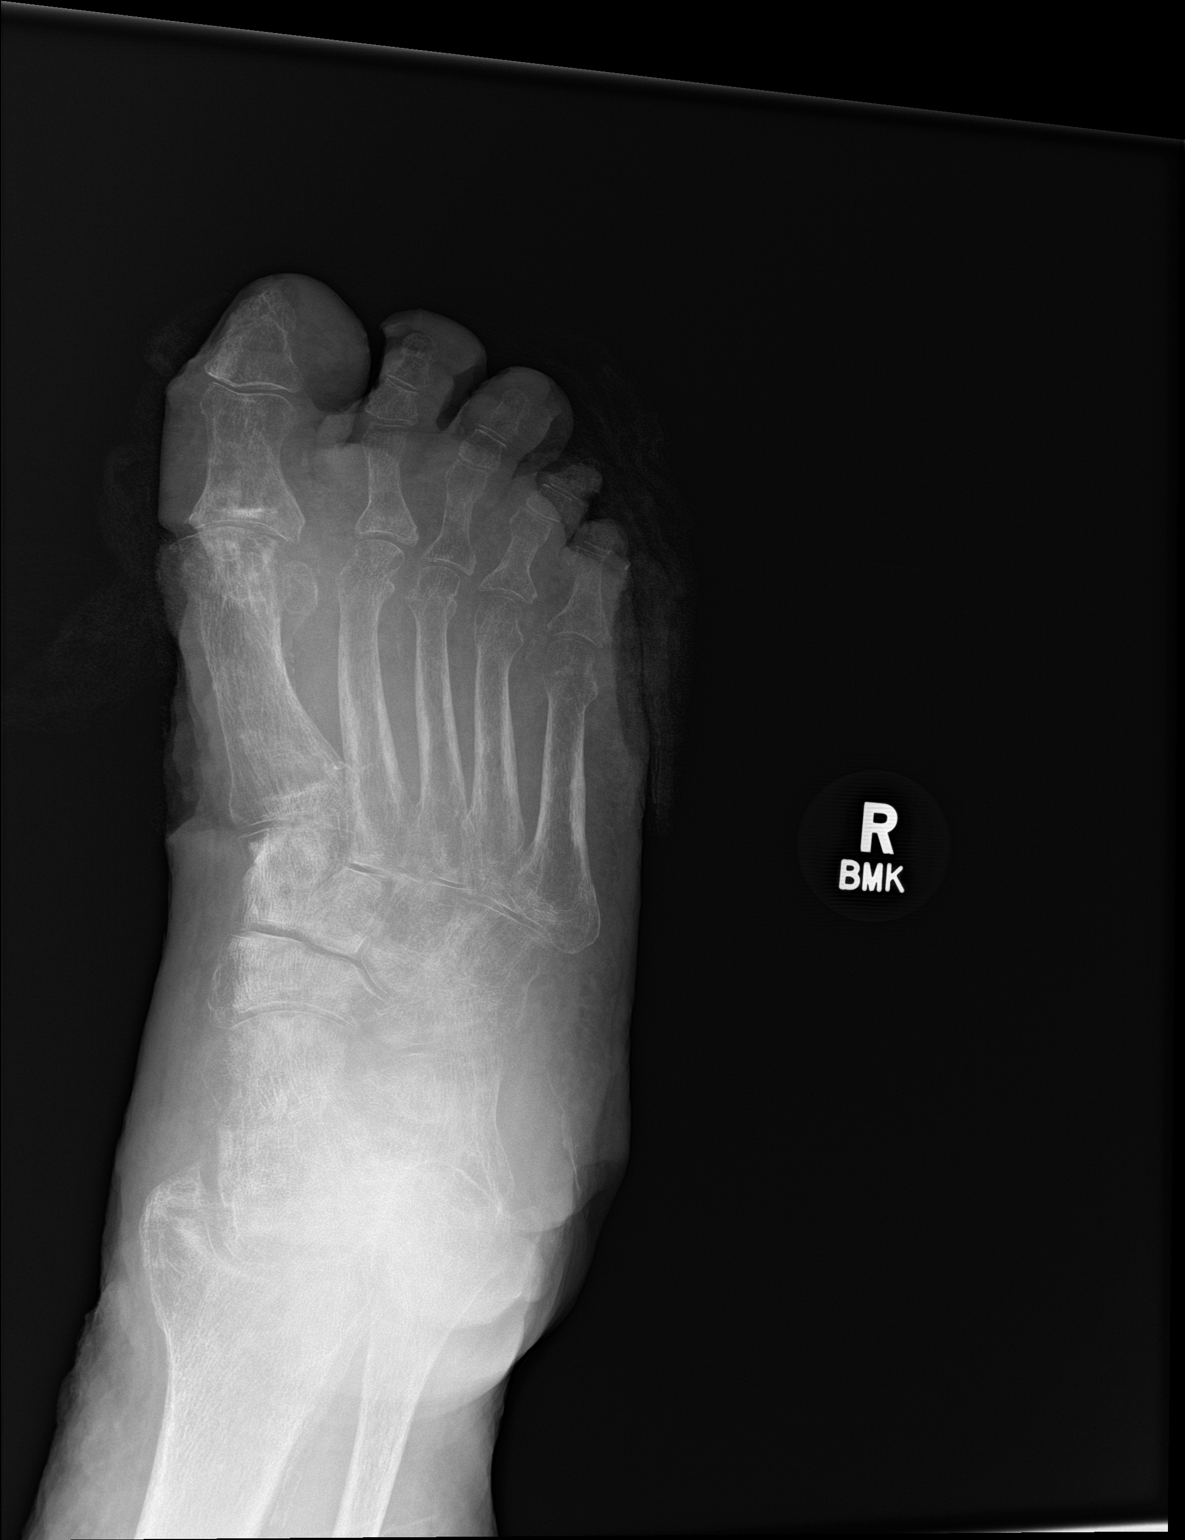

[foot lat]
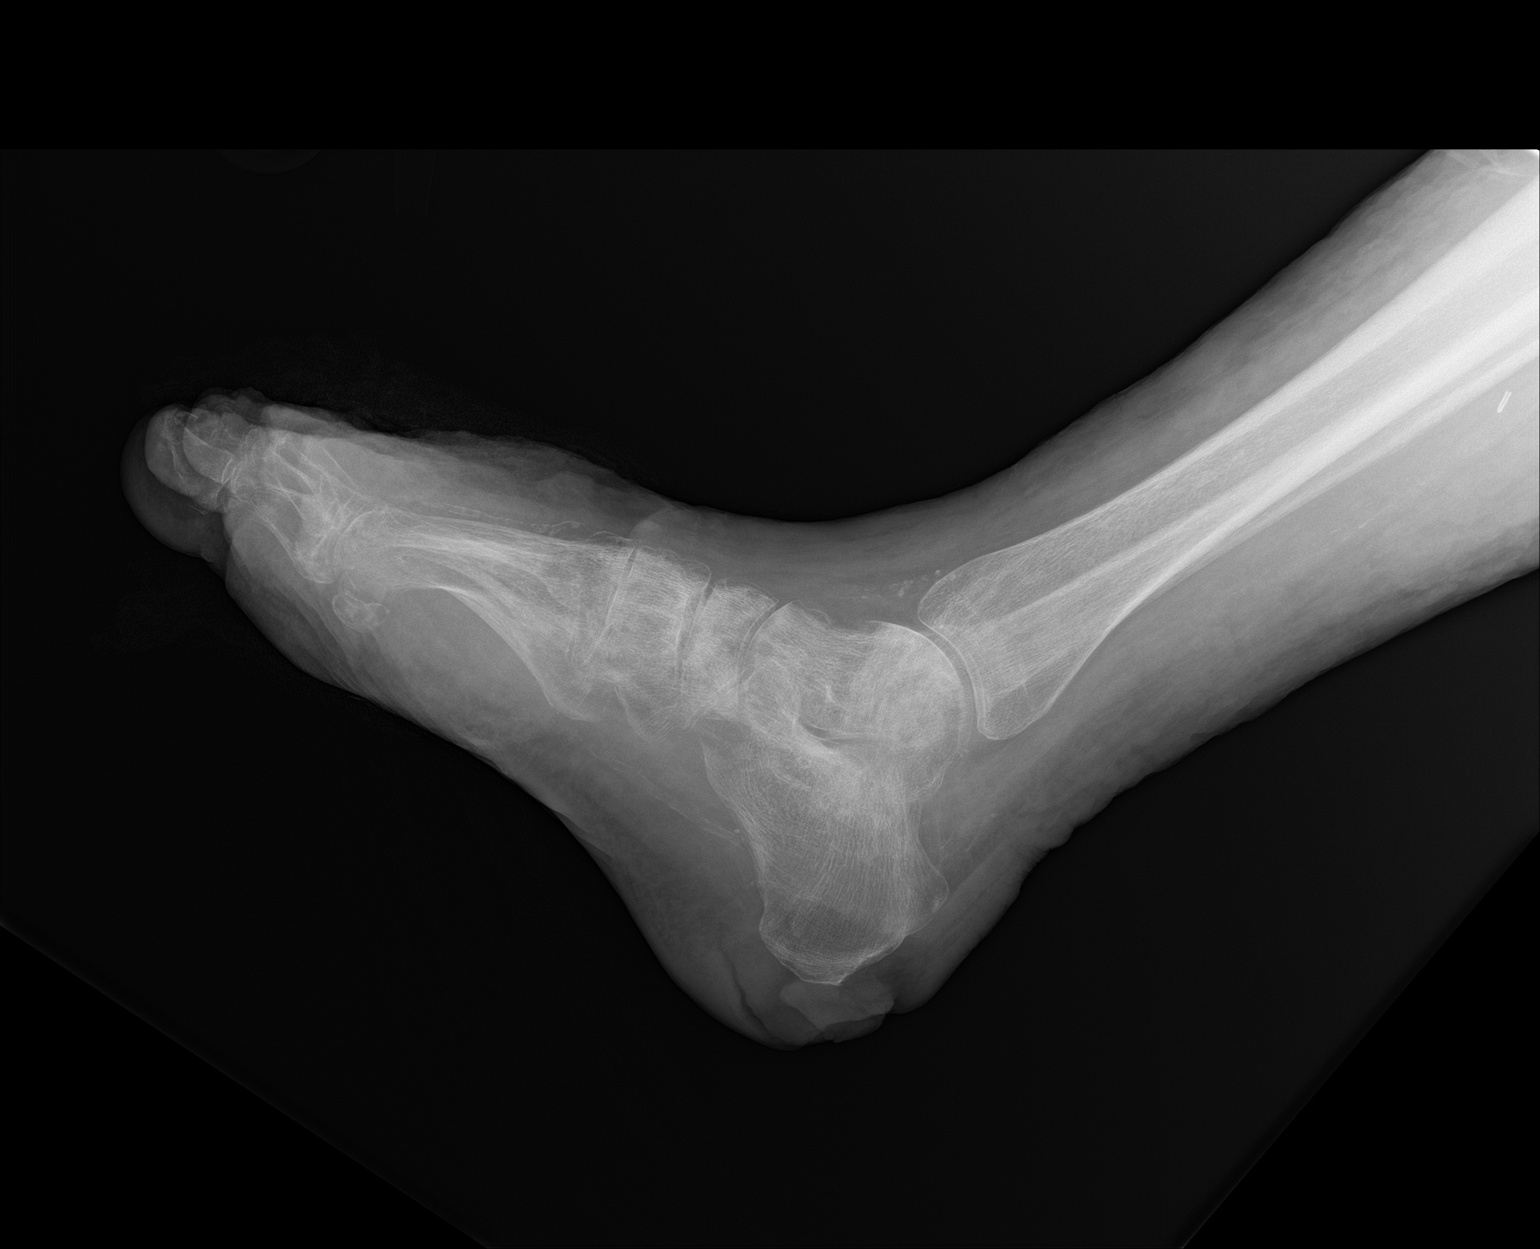

[3 of 3 positions shown; findings below may reference images not displayed]

FINDINGS: The bones of the right foot are diffusely osteopenic. There is
diffuse soft tissue swelling of the right foot. There are soft
tissue ulcers involving the heel, likely representing a decubitus
ulcer of the heel, as well as medial to the first
metatarsophalangeal joint, involving the lateral aspect of the great
toe, the medial aspect of the second digit, and the distal aspect of
the fourth and fifth digits. There are erosive changes involving the
first metatarsal head medially in keeping with changes of
osteomyelitis. There is probable exposure of the distal phalanx of
the second, fourth, and fifth digits by overlying ulcers with
erosive changes involving the second through fifth distal phalanges
in keeping with osteomyelitis. No superimposed fracture. Advanced
vascular calcifications are seen throughout the right foot.
IMPRESSION: Multiple ulcerations of the right foot with erosive changes in
keeping with osteomyelitis involving the first metatarsal head and
distal phalanges of the second through fifth digits.

Decubitus heel ulcer.

Peripheral vascular disease.
